# Patient Record
Sex: Female | Born: 1970 | Race: White | Hispanic: No | Marital: Married | Smoking: Current some day smoker
Health system: Southern US, Community
[De-identification: ages and names within clinical notes are randomized; demographics above are authoritative.]

## PROBLEM LIST (undated history)

## (undated) DIAGNOSIS — K219 Gastro-esophageal reflux disease without esophagitis: Secondary | ICD-10-CM

## (undated) DIAGNOSIS — M1990 Unspecified osteoarthritis, unspecified site: Secondary | ICD-10-CM

## (undated) DIAGNOSIS — C801 Malignant (primary) neoplasm, unspecified: Secondary | ICD-10-CM

## (undated) DIAGNOSIS — D219 Benign neoplasm of connective and other soft tissue, unspecified: Secondary | ICD-10-CM

## (undated) DIAGNOSIS — F32A Depression, unspecified: Secondary | ICD-10-CM

## (undated) DIAGNOSIS — D649 Anemia, unspecified: Secondary | ICD-10-CM

## (undated) DIAGNOSIS — J189 Pneumonia, unspecified organism: Secondary | ICD-10-CM

## (undated) DIAGNOSIS — I10 Essential (primary) hypertension: Secondary | ICD-10-CM

## (undated) DIAGNOSIS — F1010 Alcohol abuse, uncomplicated: Secondary | ICD-10-CM

## (undated) DIAGNOSIS — N920 Excessive and frequent menstruation with regular cycle: Secondary | ICD-10-CM

## (undated) DIAGNOSIS — R7303 Prediabetes: Secondary | ICD-10-CM

## (undated) DIAGNOSIS — I639 Cerebral infarction, unspecified: Secondary | ICD-10-CM

## (undated) DIAGNOSIS — F419 Anxiety disorder, unspecified: Secondary | ICD-10-CM

## (undated) HISTORY — PX: BACK SURGERY: SHX140

## (undated) HISTORY — PX: OTHER SURGICAL HISTORY: SHX169

## (undated) HISTORY — PX: FRACTURE SURGERY: SHX138

## (undated) HISTORY — PX: ELBOW SURGERY: SHX618

## (undated) HISTORY — DX: Gastro-esophageal reflux disease without esophagitis: K21.9

## (undated) HISTORY — PX: BREAST BIOPSY: SHX20

## (undated) HISTORY — PX: TUBAL LIGATION: SHX77

---

## 1998-03-05 ENCOUNTER — Other Ambulatory Visit: Admission: RE | Admit: 1998-03-05 | Discharge: 1998-03-05 | Payer: Self-pay | Admitting: Obstetrics and Gynecology

## 1999-03-09 ENCOUNTER — Encounter: Payer: Self-pay | Admitting: Surgery

## 1999-03-09 ENCOUNTER — Emergency Department (HOSPITAL_COMMUNITY): Admission: EM | Admit: 1999-03-09 | Discharge: 1999-03-09 | Payer: Self-pay | Admitting: Emergency Medicine

## 1999-03-31 ENCOUNTER — Other Ambulatory Visit: Admission: RE | Admit: 1999-03-31 | Discharge: 1999-03-31 | Payer: Self-pay | Admitting: Obstetrics and Gynecology

## 1999-11-17 ENCOUNTER — Encounter: Payer: Self-pay | Admitting: *Deleted

## 1999-11-17 ENCOUNTER — Emergency Department (HOSPITAL_COMMUNITY): Admission: EM | Admit: 1999-11-17 | Discharge: 1999-11-17 | Payer: Self-pay | Admitting: Emergency Medicine

## 2005-02-24 ENCOUNTER — Ambulatory Visit: Payer: Self-pay | Admitting: Internal Medicine
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
## 2005-08-06 ENCOUNTER — Ambulatory Visit: Payer: Self-pay

## 2006-07-27 ENCOUNTER — Emergency Department: Payer: Self-pay | Admitting: Emergency Medicine

## 2006-10-06 ENCOUNTER — Emergency Department: Payer: Self-pay
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
## 2006-11-06 ENCOUNTER — Emergency Department: Payer: Self-pay | Admitting: Emergency Medicine

## 2006-11-06 IMAGING — CR DG FOOT COMPLETE 3+V*L*
1 series · 3 of 3 positions shown · non-contrast
Comparison: none

REASON FOR EXAM: INJURY
COMMENTS:

[Series 1: view not recorded · 0.17mm/px · 3 of 3 slices shown]
[im 1/3]
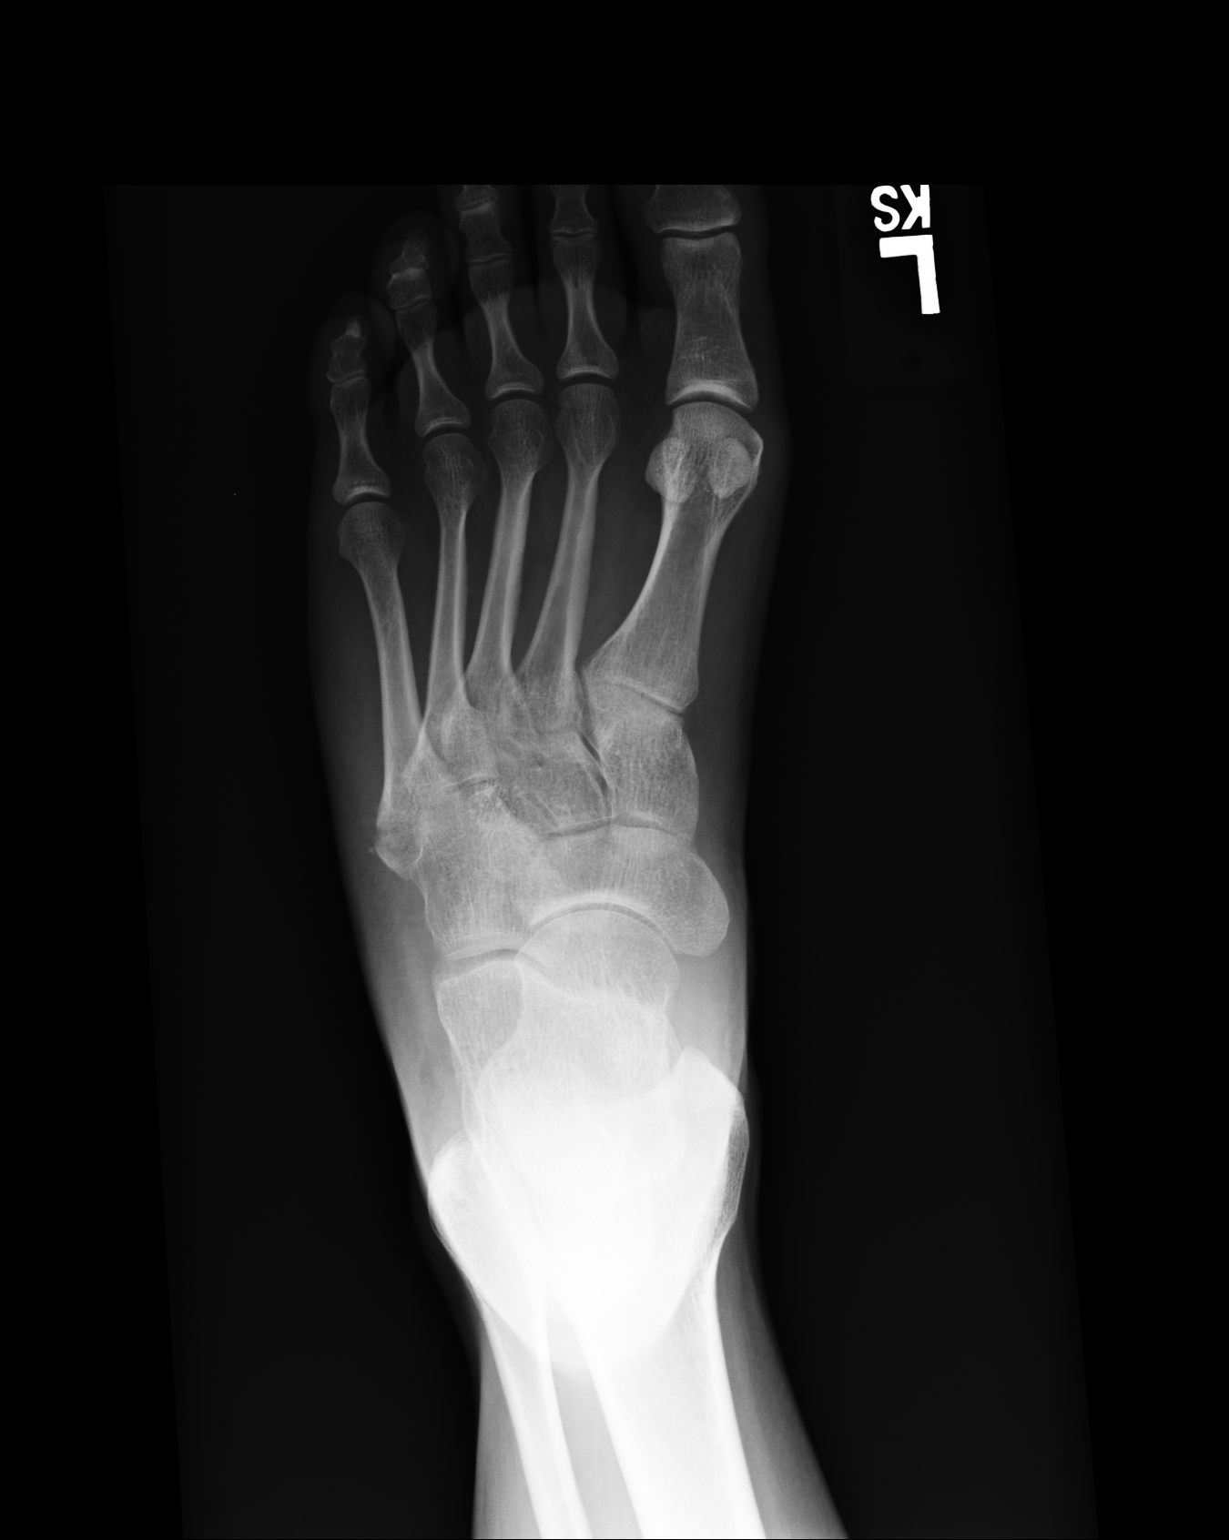
[im 2/3]
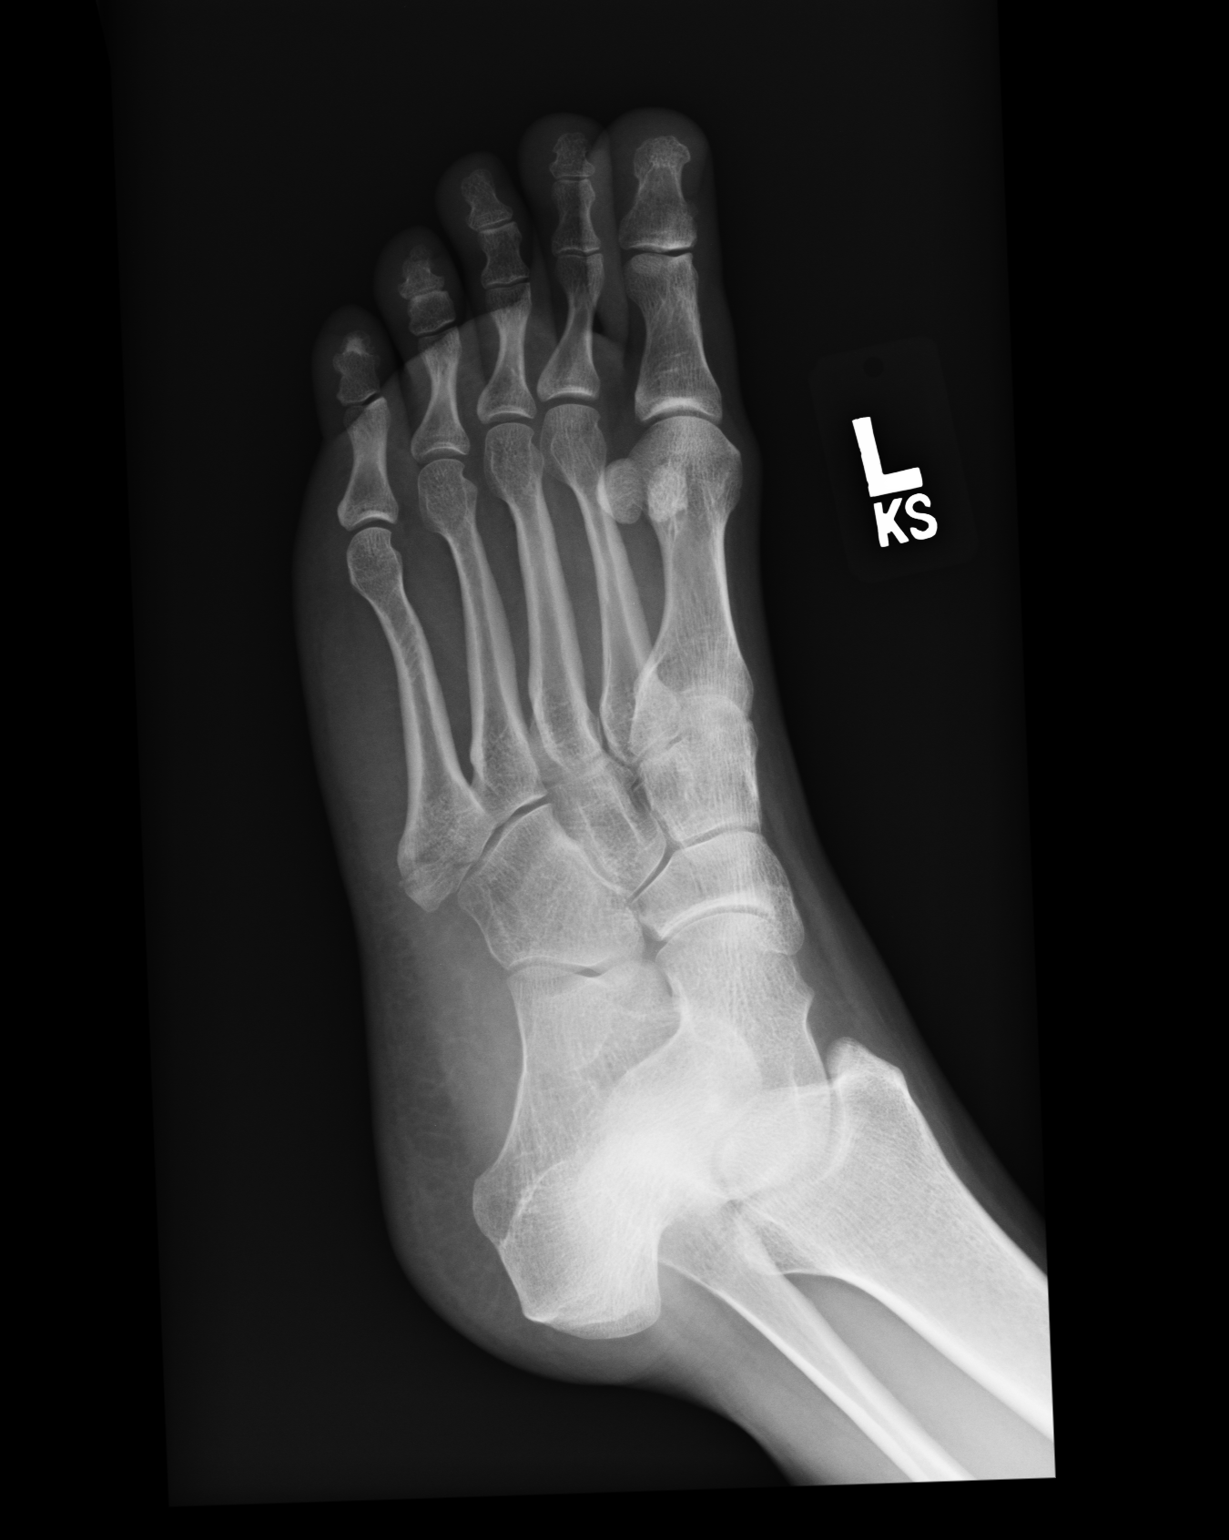
[im 3/3]
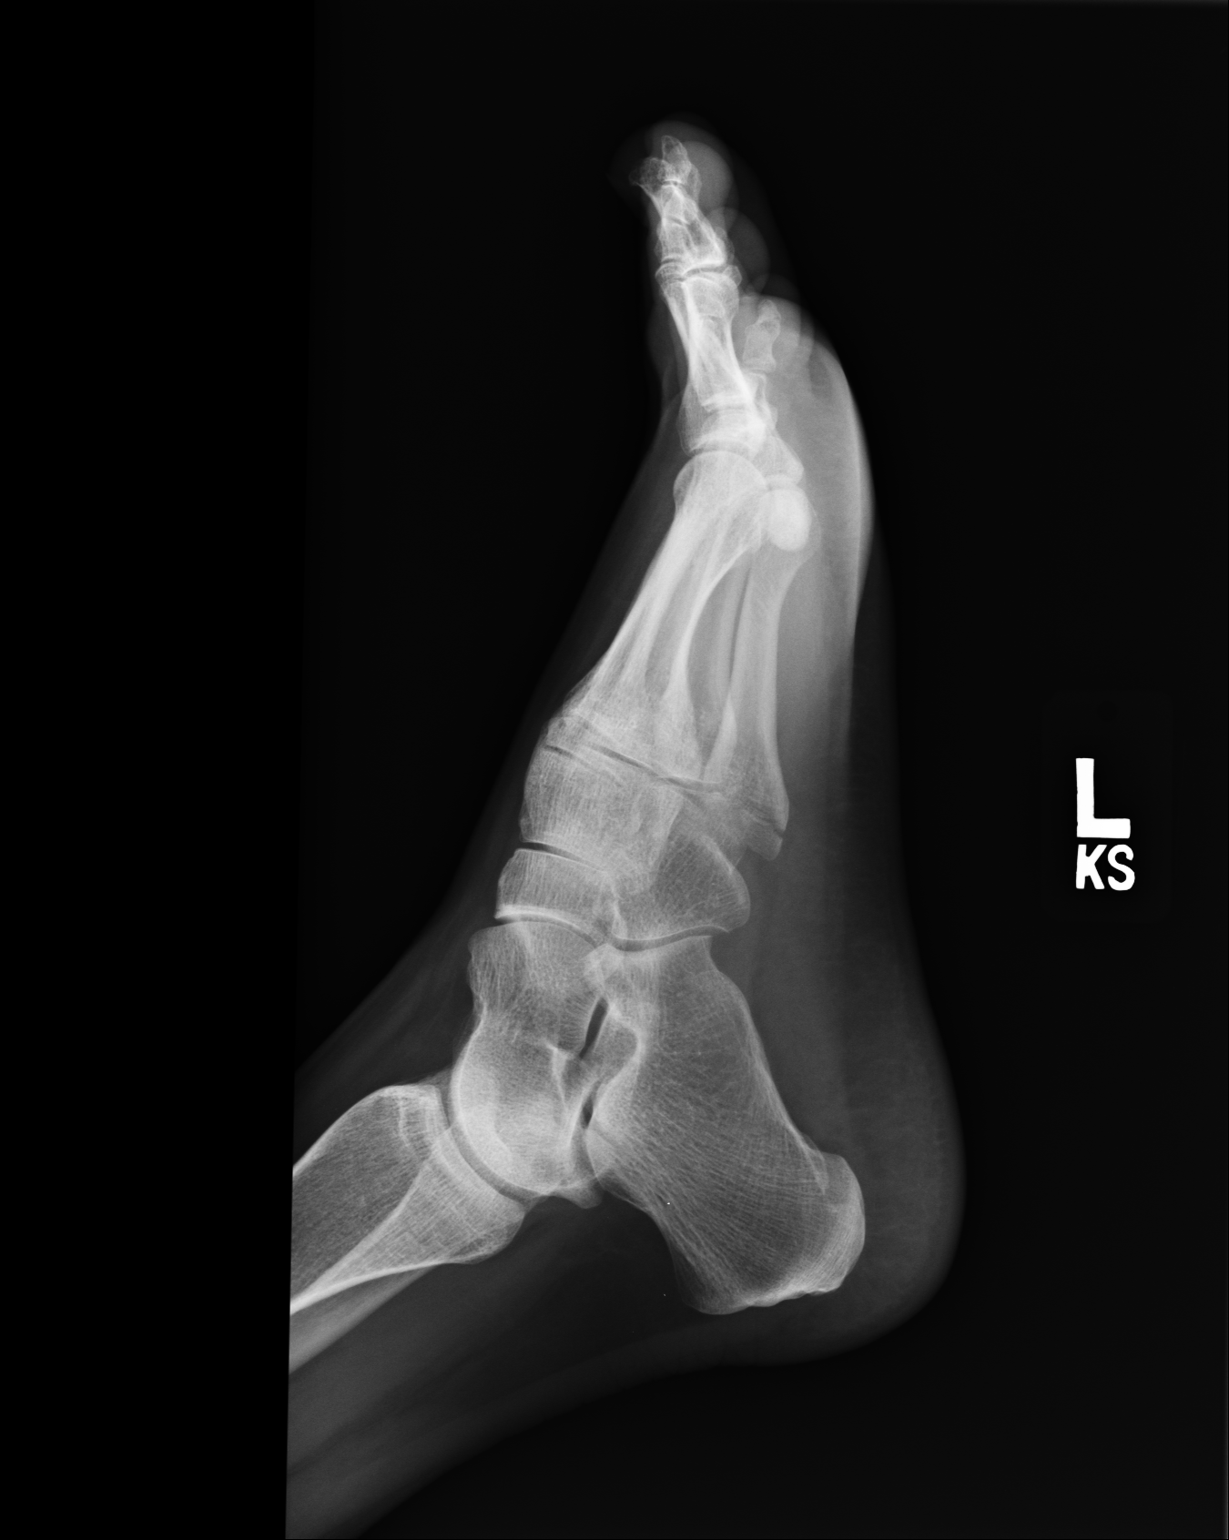

[3 of 3 positions shown; findings below may reference images not displayed]

PROCEDURE:     DXR - DXR FOOT LT COMP W/OBLIQUES  - [DATE]  [DATE]

RESULT:     There is a fracture through the base the fifth metatarsal. There
is mild distraction of the fracture fragments. The other metatarsals appear
intact. I do not see evidence of a phalangeal fracture. The hindfoot is
intact as well.
IMPRESSION: The patient has sustained a fracture of the fifth
metatarsal. I do not see acute fracture elsewhere.

## 2010-07-09 ENCOUNTER — Other Ambulatory Visit: Payer: Self-pay | Admitting: Ophthalmology

## 2011-08-29 ENCOUNTER — Ambulatory Visit: Payer: Self-pay

## 2011-08-29 DIAGNOSIS — J029 Acute pharyngitis, unspecified: Secondary | ICD-10-CM

## 2013-06-02 ENCOUNTER — Encounter (HOSPITAL_COMMUNITY): Payer: Self-pay

## 2013-06-02 ENCOUNTER — Emergency Department (HOSPITAL_COMMUNITY)
Admission: EM | Admit: 2013-06-02 | Discharge: 2013-06-02 | Disposition: A | Discharge status: Home / Self Care | Payer: Self-pay | Attending: Emergency Medicine | Admitting: Emergency Medicine

## 2013-06-02 ENCOUNTER — Emergency Department (HOSPITAL_COMMUNITY): Payer: Self-pay

## 2013-06-02 DIAGNOSIS — R296 Repeated falls: Secondary | ICD-10-CM | POA: Insufficient documentation

## 2013-06-02 DIAGNOSIS — M7918 Myalgia, other site: Secondary | ICD-10-CM

## 2013-06-02 DIAGNOSIS — IMO0002 Reserved for concepts with insufficient information to code with codable children: Secondary | ICD-10-CM | POA: Insufficient documentation

## 2013-06-02 DIAGNOSIS — F17200 Nicotine dependence, unspecified, uncomplicated: Secondary | ICD-10-CM | POA: Insufficient documentation

## 2013-06-02 DIAGNOSIS — Y92009 Unspecified place in unspecified non-institutional (private) residence as the place of occurrence of the external cause: Secondary | ICD-10-CM | POA: Insufficient documentation

## 2013-06-02 DIAGNOSIS — Z79899 Other long term (current) drug therapy: Secondary | ICD-10-CM | POA: Insufficient documentation

## 2013-06-02 DIAGNOSIS — I10 Essential (primary) hypertension: Secondary | ICD-10-CM | POA: Insufficient documentation

## 2013-06-02 DIAGNOSIS — Z880 Allergy status to penicillin: Secondary | ICD-10-CM | POA: Insufficient documentation

## 2013-06-02 DIAGNOSIS — IMO0001 Reserved for inherently not codable concepts without codable children: Secondary | ICD-10-CM | POA: Insufficient documentation

## 2013-06-02 DIAGNOSIS — Y939 Activity, unspecified: Secondary | ICD-10-CM | POA: Insufficient documentation

## 2013-06-02 HISTORY — DX: Essential (primary) hypertension: I10

## 2013-06-02 LAB — BASIC METABOLIC PANEL
BUN: 14 mg/dL (ref 6–23)
CO2: 23 mEq/L (ref 19–32)
Chloride: 85 mEq/L — ABNORMAL LOW (ref 96–112)
Creatinine, Ser: 0.6 mg/dL (ref 0.50–1.10)
GFR calc Af Amer: >90 mL/min (ref >90)
GFR calc non Af Amer: >90 mL/min (ref >90)
Potassium: 3.6 mEq/L (ref 3.5–5.1)
Sodium: 129 mEq/L — ABNORMAL LOW (ref 135–145)

## 2013-06-02 LAB — URINALYSIS, ROUTINE W REFLEX MICROSCOPIC
Glucose, UA: NEGATIVE mg/dL
Ketones, ur: NEGATIVE mg/dL
Leukocytes, UA: NEGATIVE
Nitrite: NEGATIVE
Protein, ur: NEGATIVE mg/dL

## 2013-06-02 LAB — CBC
Hemoglobin: 8.8 g/dL — ABNORMAL LOW (ref 12.0–15.0)
MCH: 22.3 pg — ABNORMAL LOW (ref 26.0–34.0)
MCHC: 31.2 g/dL (ref 30.0–36.0)
Platelets: 526 10*3/uL — ABNORMAL HIGH (ref 150–400)
RBC: 3.94 MIL/uL (ref 3.87–5.11)
RDW: 16.6 % — ABNORMAL HIGH (ref 11.5–15.5)

## 2013-06-02 LAB — POCT I-STAT TROPONIN I: Troponin i, poc: 0 ng/mL (ref 0.00–0.08)

## 2013-06-02 LAB — MAGNESIUM: Magnesium: 1.8 mg/dL (ref 1.5–2.5)

## 2013-06-02 IMAGING — CR DG CHEST 2V
2 series · 2 of 2 positions shown · non-contrast
Comparison: None.

CLINICAL DATA: Chest pain

EXAM:
CHEST  2 VIEW

[w chest pa]
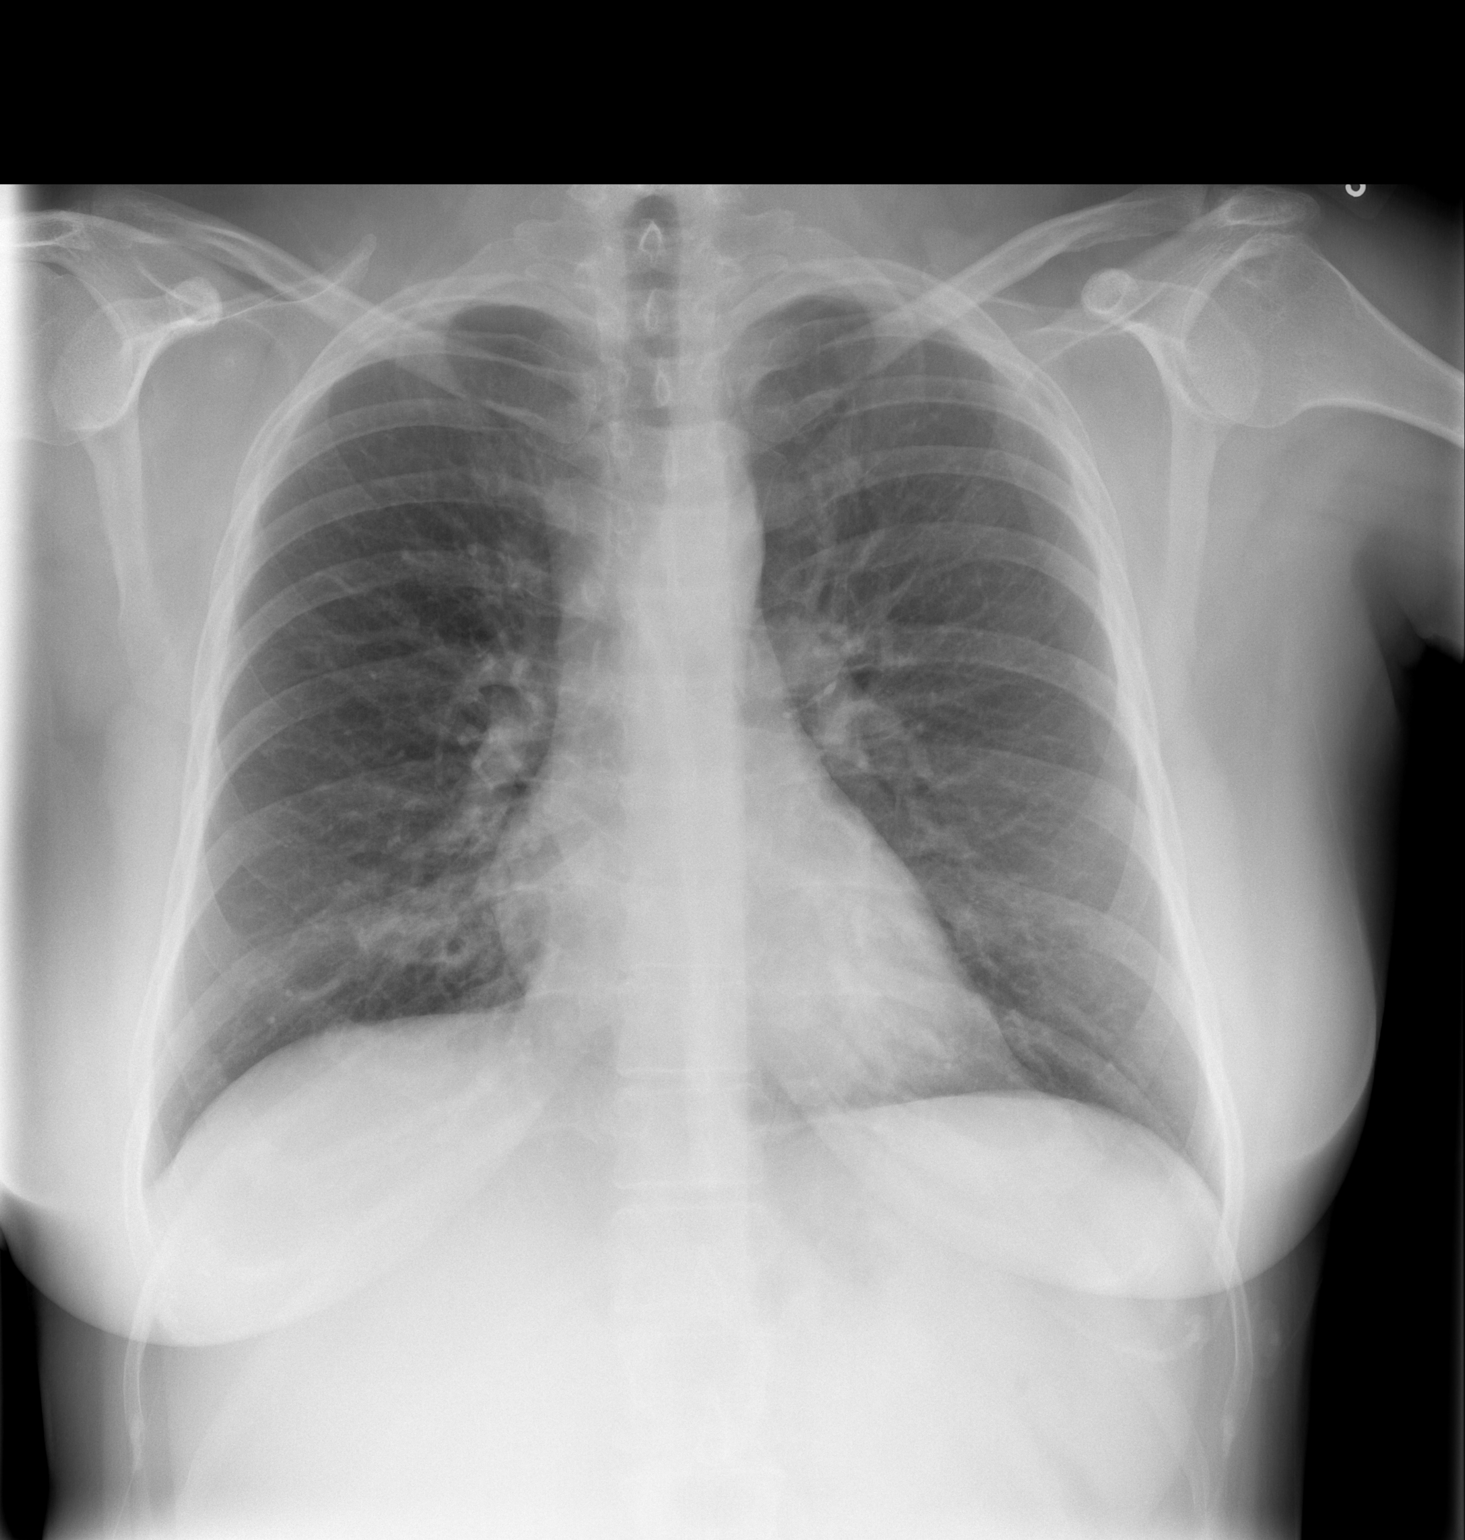

[w chest lat]
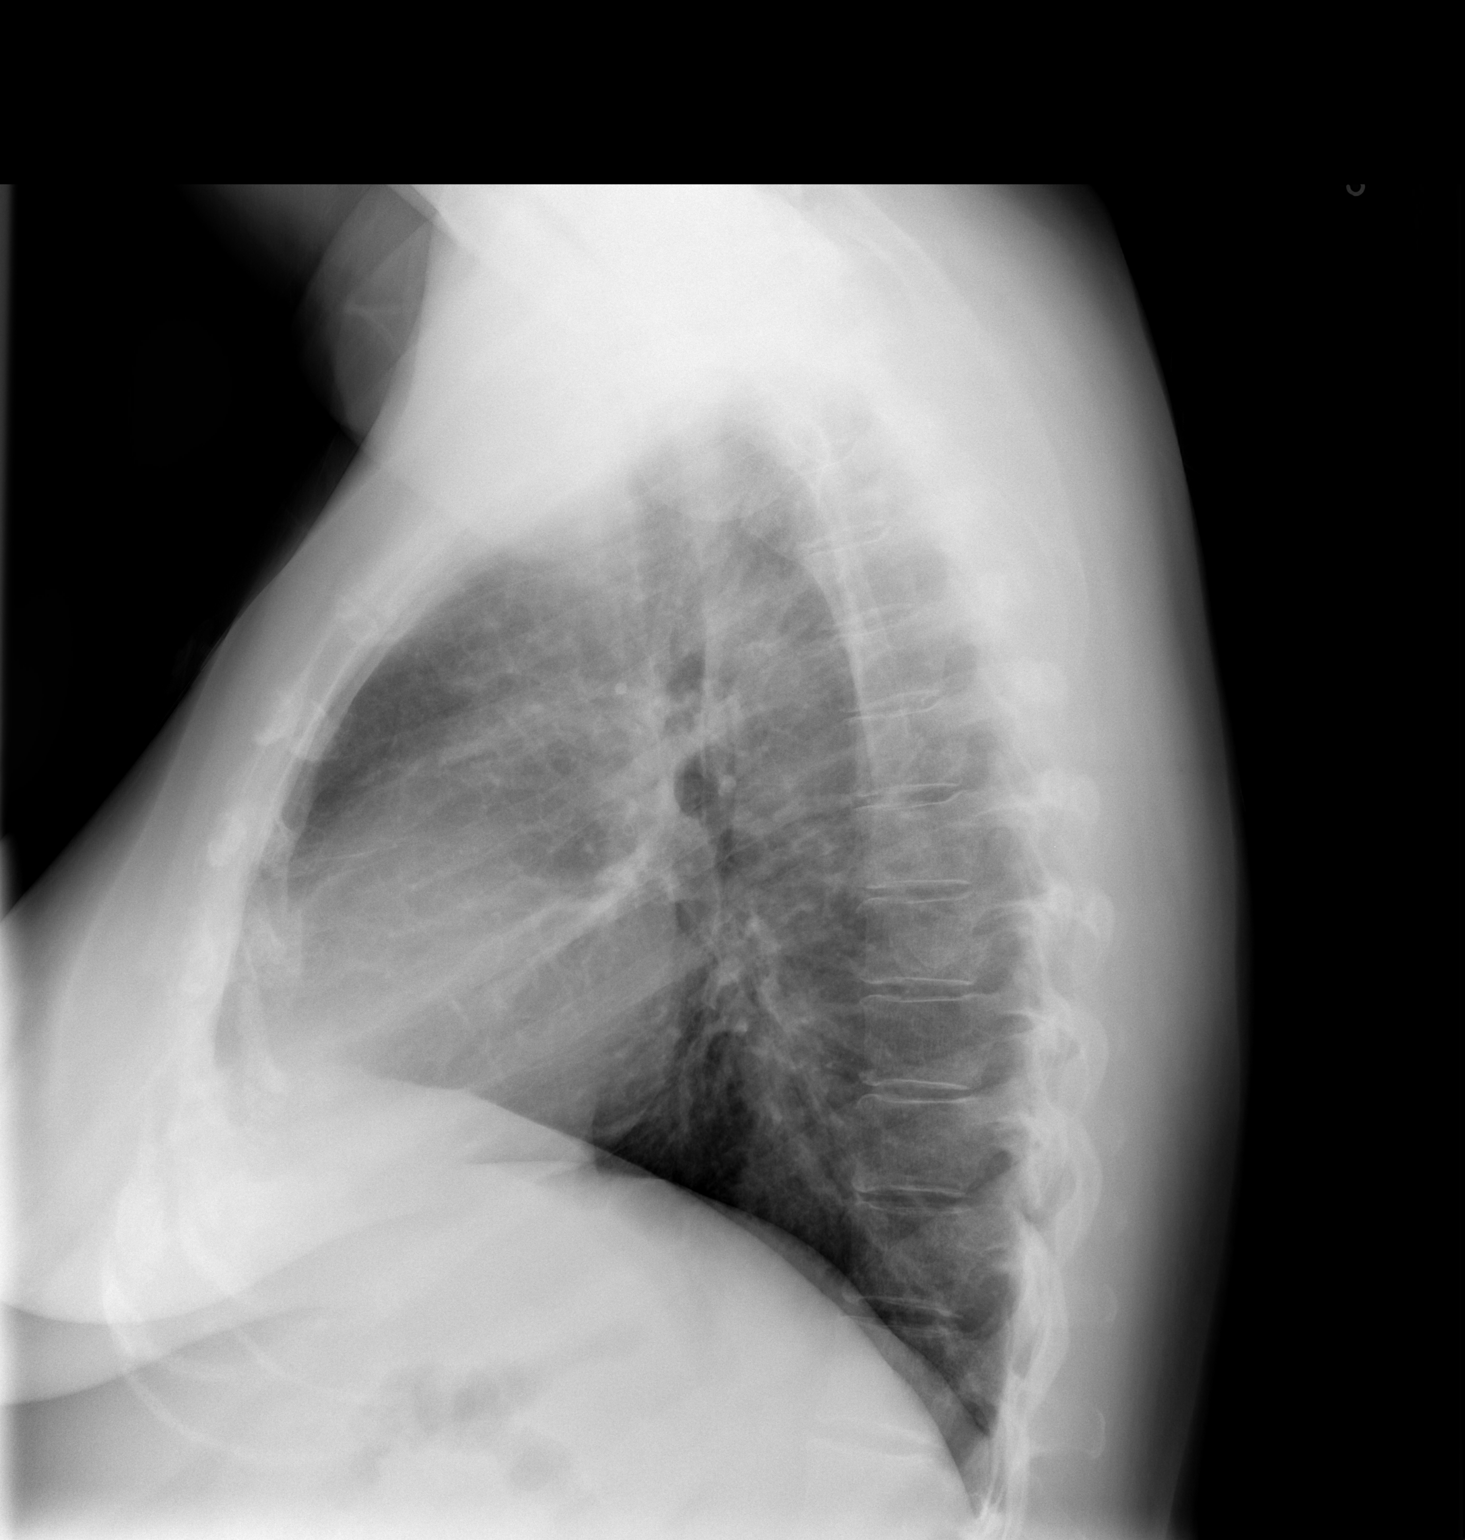

[2 of 2 positions shown; findings below may reference images not displayed]

FINDINGS: Cardiac and mediastinal contours are normal. Lungs are clear without
infiltrate or effusion or mass lesion.
IMPRESSION: No active cardiopulmonary disease.

## 2013-06-02 IMAGING — CT CT ABD-PELV W/ CM
2 of 5 series · 17 of 46 positions shown, 19 images · IV contrast (CONTRAST)
Comparison: None.

CLINICAL DATA: Patient reportedly data upper and became six to or
stomach, and apparently had a syncopal episode and a going to the
bathroom. Patient reports having right flank pain. Some nausea.

EXAM:
CT ABDOMEN AND PELVIS WITH CONTRAST
TECHNIQUE: Multidetector CT imaging of the abdomen and pelvis was performed
using the standard protocol following bolus administration of
intravenous contrast.
CONTRAST:  100mL OMNIPAQUE IOHEXOL 300 MG/ML  SOLN

[Series 2: routine · axial · 0.71mm/px · z∈[+466,+916]mm · 14 of 102 slices shown, 16 images]
[im 6/102  soft-tissue]
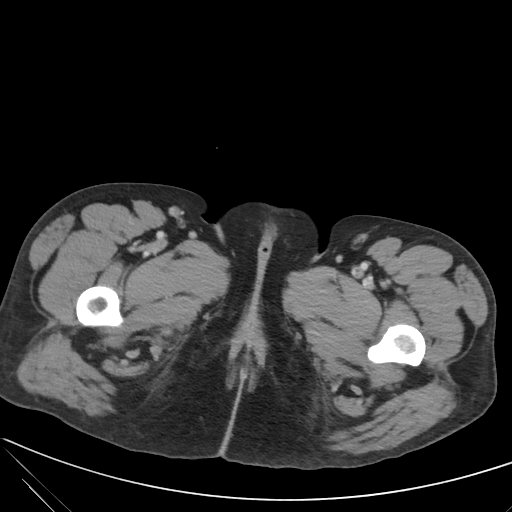
[im 6/102  bone]
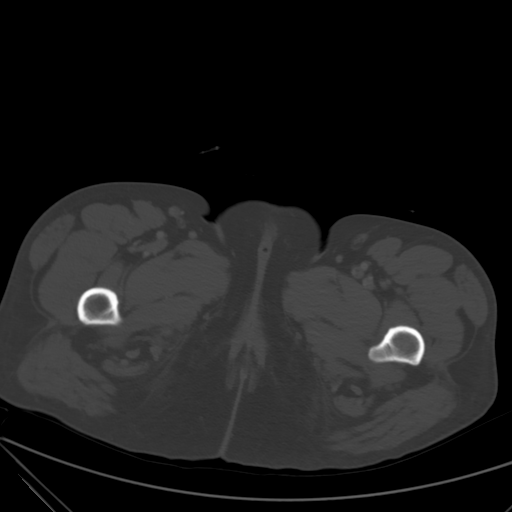
[im 11/102  soft-tissue]
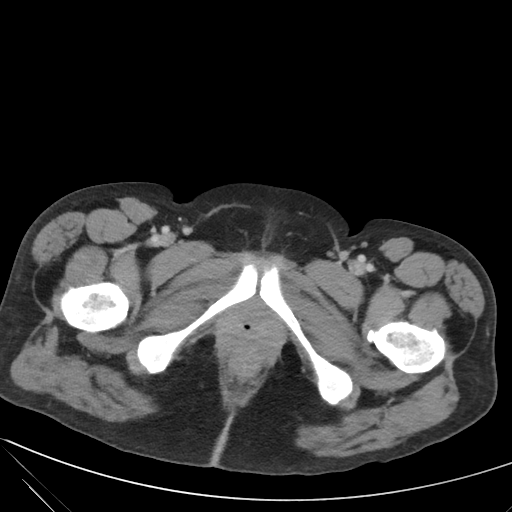
[im 22/102  soft-tissue]
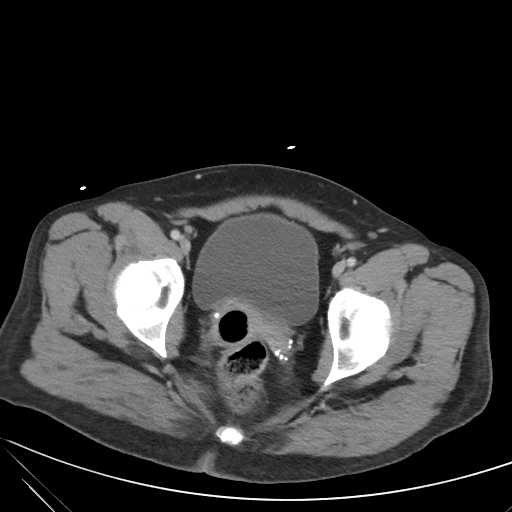
[im 27/102  soft-tissue]
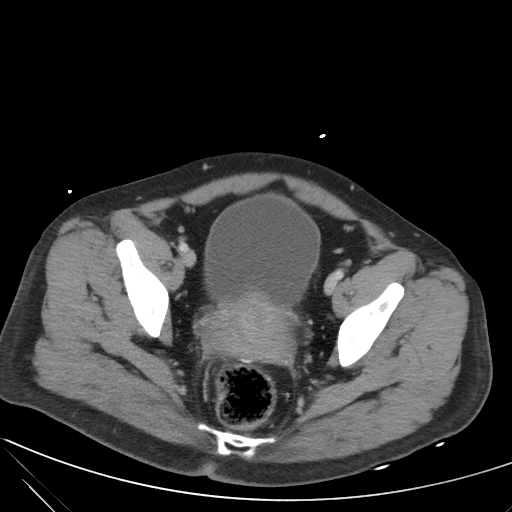
[im 32/102  soft-tissue]
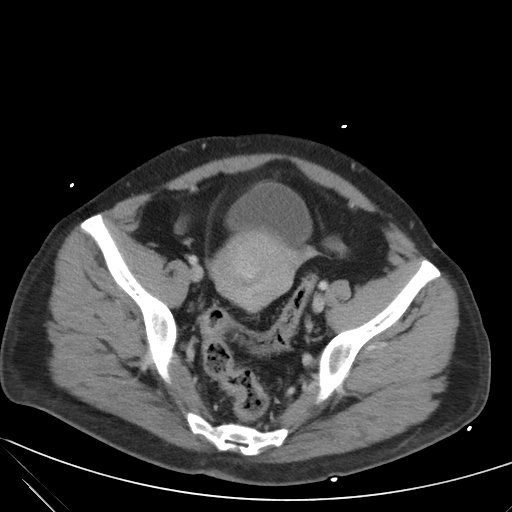
[im 43/102  soft-tissue]
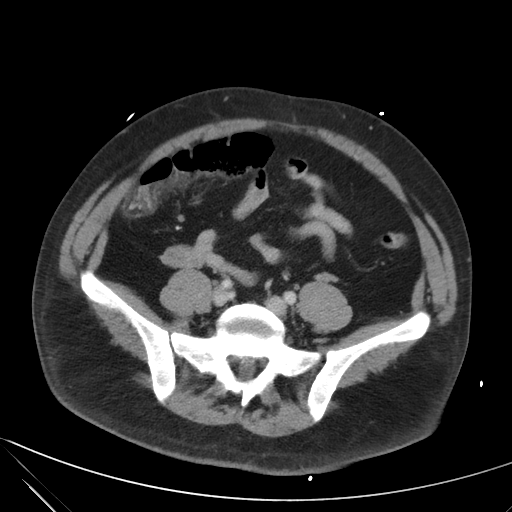
[im 48/102  soft-tissue]
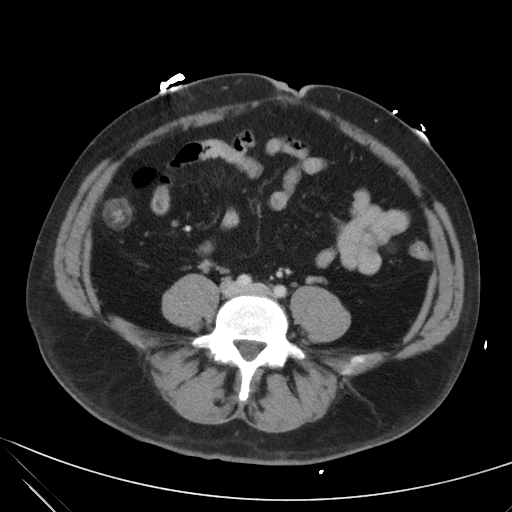
[im 54/102  soft-tissue]
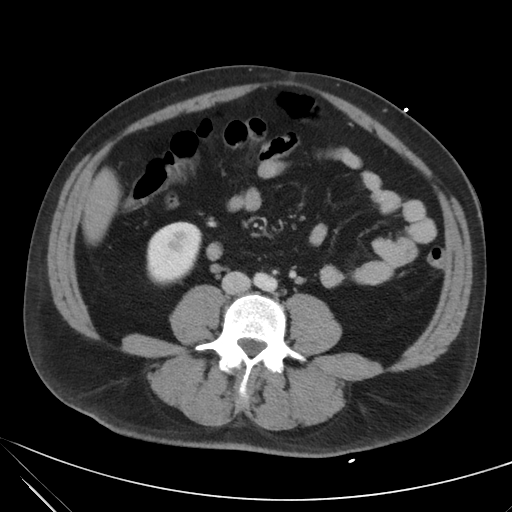
[im 59/102  soft-tissue]
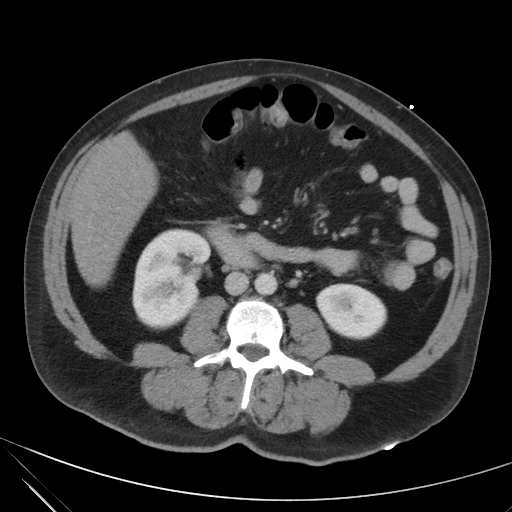
[im 59/102  bone]
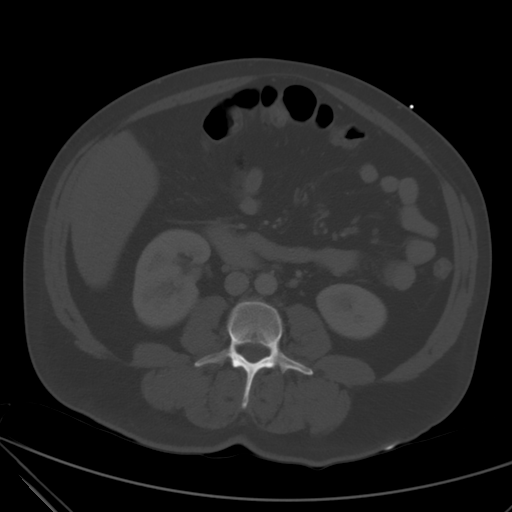
[im 70/102  soft-tissue]
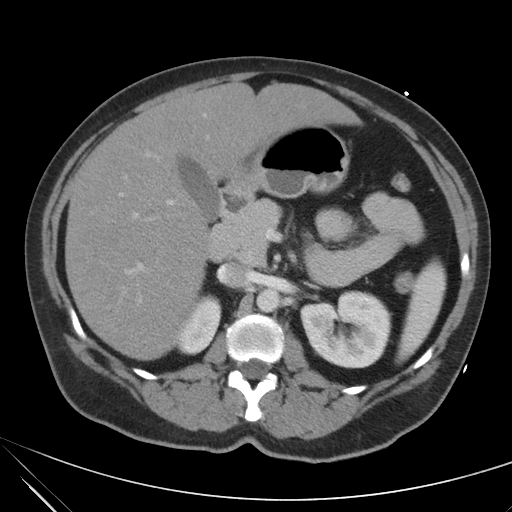
[im 75/102  soft-tissue]
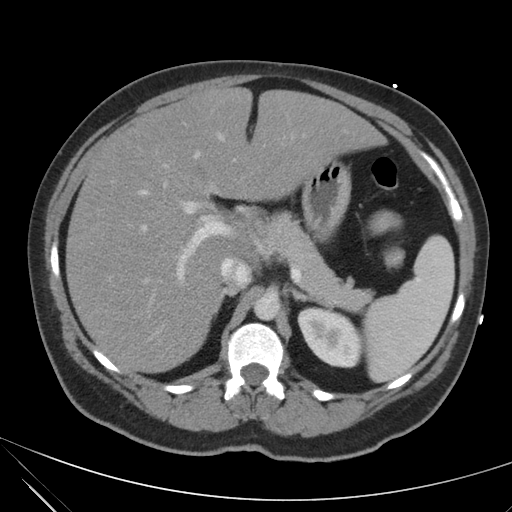
[im 80/102  soft-tissue]
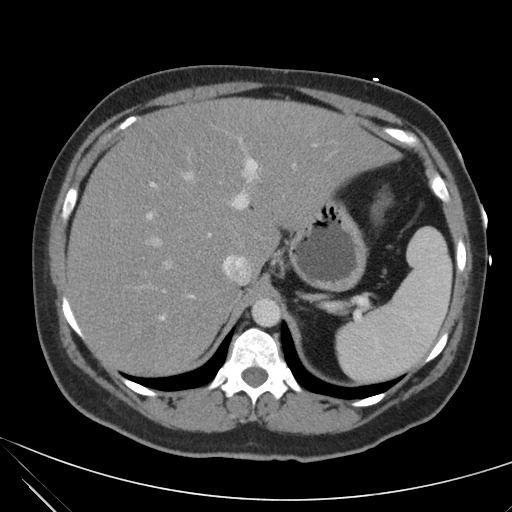
[im 91/102  soft-tissue]
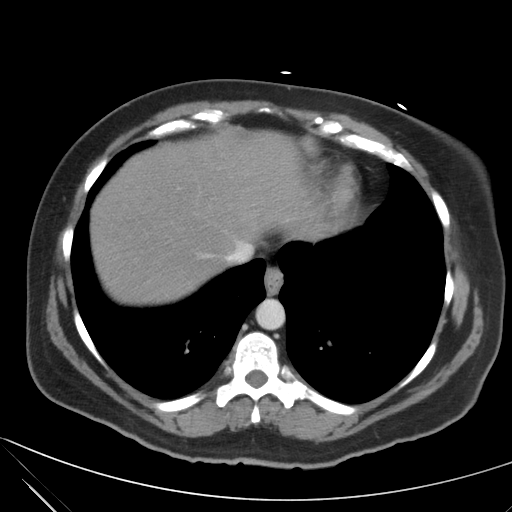
[im 96/102  soft-tissue]
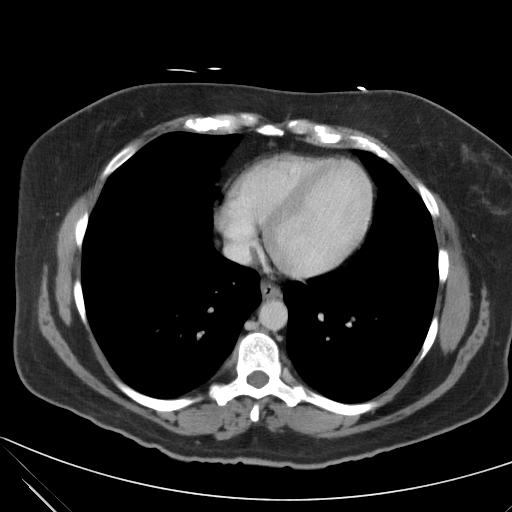

[coronals · coronal · 0.98mm/px · 3 of 109 slices shown]
[im 37/109  soft-tissue]
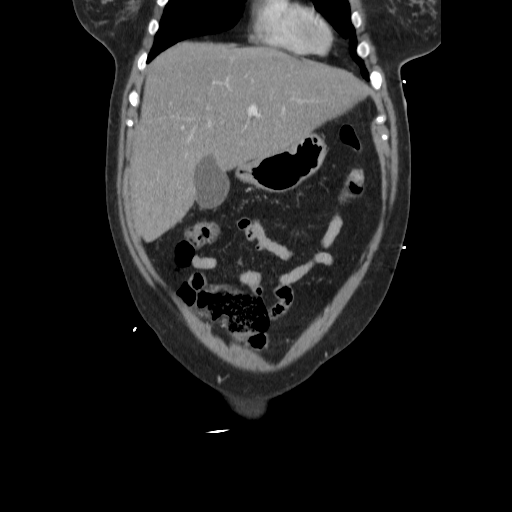
[im 49/109  soft-tissue]
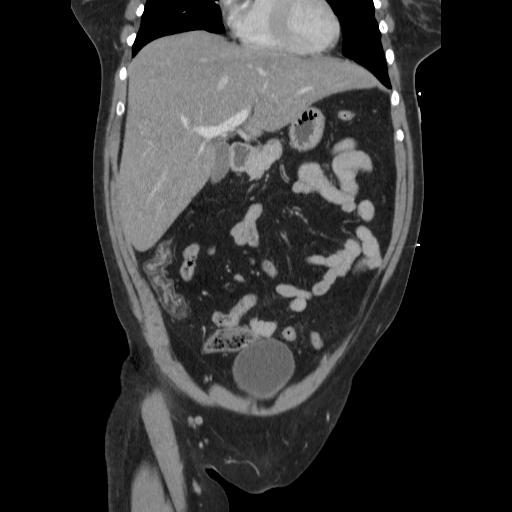
[im 61/109  soft-tissue]
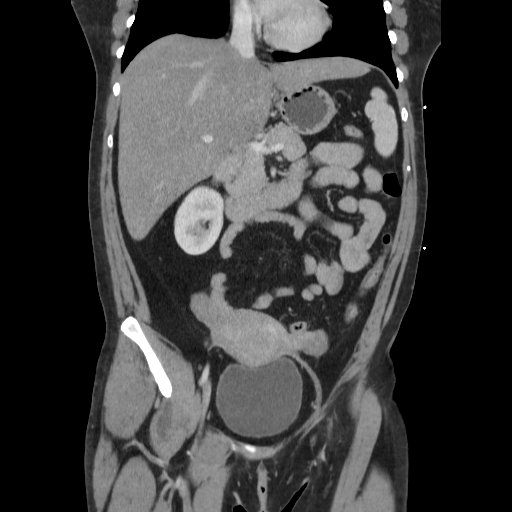

[17 of 46 positions shown; findings below may reference images not displayed]

FINDINGS: Clear lung bases. The heart is normal in size.

The liver is mildly enlarged and diffusely hypoattenuating
consistent with hepatic steatosis. No liver mass or focal lesion is
seen. Normal spleen, gallbladder and pancreas. No bile duct
dilation. No adrenal masses. Normal kidneys, ureters and bladder.

The uterus and adnexa are unremarkable.

No adenopathy. There are no abnormal fluid collections.

The bowel is unremarkable. A short normal caliber appendix is seen.

No bony abnormality.
IMPRESSION: 1. No acute findings.
2. Mild hepatomegaly and hepatic steatosis.
3. No other abnormalities.

## 2013-06-02 MED ORDER — HYDROCODONE-ACETAMINOPHEN 5-325 MG PO TABS: 2.0000 | ORAL_TABLET | ORAL | Status: DC | PRN

## 2013-06-02 MED ORDER — IOHEXOL 300 MG/ML  SOLN
100.0000 mL | Freq: Once | INTRAMUSCULAR | Status: AC | PRN
  Administered 2013-06-02: 100 mL via INTRAVENOUS

## 2013-06-02 MED ORDER — CYCLOBENZAPRINE HCL 10 MG PO TABS: 10.0000 mg | ORAL_TABLET | Freq: Two times a day (BID) | ORAL | Status: DC | PRN

## 2013-06-02 MED ORDER — HYDROMORPHONE HCL PF 1 MG/ML IJ SOLN
1.0000 mg | Freq: Once | INTRAMUSCULAR | Status: AC
  Administered 2013-06-02: 1 mg via INTRAVENOUS
  Filled 2013-06-02: qty 1

## 2013-06-02 NOTE — ED Notes (Signed)
Patient transported to CT 

## 2013-06-02 NOTE — ED Provider Notes (Addendum)
CSN: [id number]     Arrival date & time 06/02/13  1219 History   First MD Initiated Contact with Patient 06/02/13 1556     Chief Complaint  Patient presents with  . Chest Pain   (Consider location/radiation/quality/duration/timing/severity/associated sxs/prior Treatment) Patient is a 42 y.o. female presenting with back pain. The history is provided by the patient. No language interpreter was used.  Back Pain Pain location: right paraspinal thoracic. Quality:  Aching and cramping Radiates to:  Does not radiate Pain severity:  Severe Pain is:  Same all the time Onset quality:  Sudden Duration:  12 hours Timing:  Intermittent Progression:  Waxing and waning Chronicity:  New Context: falling   Relieved by:  Bed rest Worsened by:  Movement Ineffective treatments:  None tried Associated symptoms: no abdominal pain, no bladder incontinence, no bowel incontinence, no chest pain, no dysuria, no fever, no numbness and no weakness     Past Medical History  Diagnosis Date  . Hypertension    Past Surgical History  Procedure Laterality Date  . Elbow surgery    . Tubal ligation     History reviewed. No pertinent family history. History  Substance Use Topics  . Smoking status: Current Some Day Smoker  . Smokeless tobacco: Not on file  . Alcohol Use: No   OB History   Grav Para Term Preterm Abortions TAB SAB Ect Mult Living                 Review of Systems  Constitutional: Negative for fever and appetite change.  Respiratory: Negative for shortness of breath.   Cardiovascular: Negative for chest pain and palpitations.  Gastrointestinal: Negative for nausea, vomiting, abdominal pain, diarrhea, abdominal distention and bowel incontinence.  Genitourinary: Negative for bladder incontinence, dysuria and hematuria.  Musculoskeletal: Positive for back pain. Negative for gait problem.  Neurological: Negative for weakness and numbness.  All other systems reviewed and are  negative.    Allergies  Amoxicillin  Home Medications   Current Outpatient Rx  Name  Route  Sig  Dispense  Refill  . ibuprofen (ADVIL,MOTRIN) 400 MG tablet   Oral   Take 400 mg by mouth every 6 (six) hours as needed for pain (for pain).         Marland Kitchen lisinopril-hydrochlorothiazide (PRINZIDE,ZESTORETIC) 10-12.5 MG per tablet   Oral   Take 1 tablet by mouth daily.         . predniSONE (STERAPRED UNI-PAK) 10 MG tablet   Oral   Take 10 mg by mouth daily. Pt had skin irritation, finished 06/01/13         . triamcinolone (KENALOG) 0.025 % cream   Topical   Apply 1 application topically 2 (two) times daily.          BP 124/77  Pulse 93  Temp(Src) 98.3 F (36.8 C) (Oral)  Resp 20  Ht 5\' 3"  (1.6 m)  Wt 168 lb (76.204 kg)  BMI 29.77 kg/m2  SpO2 99%  LMP 06/02/2013 Physical Exam  Vitals reviewed. Constitutional: She is oriented to person, place, and time. She appears well-developed and well-nourished.  HENT:  Head: Atraumatic.  Eyes: Pupils are equal, round, and reactive to light.  Neck: Normal range of motion.  Cardiovascular: Normal rate, regular rhythm, normal heart sounds and intact distal pulses.   Pulmonary/Chest: Effort normal and breath sounds normal. She exhibits no tenderness.  Abdominal: Soft. Bowel sounds are normal. She exhibits no distension. There is no tenderness.  Musculoskeletal: Normal range of  motion.       Cervical back: Normal.       Thoracic back: She exhibits tenderness. She exhibits no bony tenderness.       Lumbar back: Normal.  Neurological: She is alert and oriented to person, place, and time.  Skin: Skin is warm and dry.    ED Course  Procedures (including critical care time)  Ultrasound limited abdominal and limited transthoracic ultrasound (FAST)  Indication: Fall, Right flank pain Four views were obtained using the low frequency transducer: Splenorenal, Hepatorenal, Retrovesical, Pericardial subxyphoid Interpretation: No free  fluid visualized surrounding the kidneys, pelvis or pericardium. Images archived electronically Dr. Jodi Mourning personally performed and interpreted the images  Labs Review Labs Reviewed  CBC - Abnormal; Notable for the following:    WBC 16.1 (*)    Hemoglobin 8.8 (*)    HCT 28.2 (*)    MCV 71.6 (*)    MCH 22.3 (*)    RDW 16.6 (*)    Platelets 526 (*)    All other components within normal limits  BASIC METABOLIC PANEL - Abnormal; Notable for the following:    Sodium 129 (*)    Chloride 85 (*)    Glucose, Bld 110 (*)    All other components within normal limits  POCT I-STAT TROPONIN I   Imaging Review Dg Chest 2 View  06/02/2013   CLINICAL DATA:  Chest pain  EXAM: CHEST  2 VIEW  COMPARISON:  None.  FINDINGS: Cardiac and mediastinal contours are normal. Lungs are clear without infiltrate or effusion or mass lesion.  IMPRESSION: No active cardiopulmonary disease.   Electronically Signed   By: Marlan Palau M.D.   On: 06/02/2013 13:06   Ct Abdomen Pelvis W Contrast  06/02/2013   CLINICAL DATA:  Patient reportedly data upper and became six to or stomach, and apparently had a syncopal episode and a going to the bathroom. Patient reports having right flank pain. Some nausea.  EXAM: CT ABDOMEN AND PELVIS WITH CONTRAST  TECHNIQUE: Multidetector CT imaging of the abdomen and pelvis was performed using the standard protocol following bolus administration of intravenous contrast.  CONTRAST:  OMNIPAQUE IOHEXOL 300 MG/ML  SOLN  COMPARISON:  None.  FINDINGS: Clear lung bases. The heart is normal in size.  The liver is mildly enlarged and diffusely hypoattenuating consistent with hepatic steatosis. No liver mass or focal lesion is seen. Normal spleen, gallbladder and pancreas. No bile duct dilation. No adrenal masses. Normal kidneys, ureters and bladder.  The uterus and adnexa are unremarkable.  No adenopathy. There are no abnormal fluid collections.  The bowel is unremarkable. A short normal caliber  appendix is seen.  No bony abnormality.  IMPRESSION: 1. No acute findings. 2. Mild hepatomegaly and hepatic steatosis. 3. No other abnormalities.   Electronically Signed   By: Amie Portland   On: 06/02/2013 19:00   Date: 06/03/2013  Rate: 95  Rhythm: normal sinus rhythm  QRS Axis: normal  Intervals: QT prolonged  ST/T Wave abnormalities: normal  Conduction Disutrbances:none  Narrative Interpretation: sinus rhythm, QT prolonged  Old EKG Reviewed: none available    MDM   1. Musculoskeletal pain    42 y/o female presenting with right paraspinal tenderness after fall at home. Reports drinking heavily the night before and awoke feeling nauseous. Went to the bathroom where she became diaphoretic, tunnel vision, and LOC. Following this she began having cramping right paraspinal back pain. No midline pain, bowel/bladder incontinence, perianal numbness, difficulty ambulating, abdominal pain.  No evidence of head trauma. Denies SOB. CXR without pneumothorax or visible rib fracture. Labs remarkable for Hgb 8.8, WBC 16.1. CT abdomen/pelvis ordered to r/o liver injury/intraabdominal hemorrhage and showed no injury. Troponin drawn in triage, negative. Symptoms inconsistent with ACS. EKG with QT prolongation, no ischemic changes, no priors for comparison. No electrolyte abnormality or medication to explain QT prolongation. Given imaging findings, low risk of occult pneumothorax or intraabdominal injury. Will treat pain, likely musculoskeletal contusion/spasm. Recommend PCP follow up. Return precautions discussed and patient voiced understanding of instruction.   Labs and imaging reviewed in my medical decision making. Patient discussed with my attending, Dr. Jodi Mourning.     Abagail Kitchens, MD 06/03/13 0155  Medical screening examination/treatment/procedure(s) were conducted as a shared visit with non-physician practitioner(s) or resident  and myself.  I personally evaluated the patient during the encounter and  agree with the findings and plan unless otherwise indicated.    EKG reviewed with resident.  Right flank pain with etoh hx, pt does not recall details.  CT to rule out liver injury with RUQ/ right flank pain.  Tender right lower ribs.  Bedside US no free fluid on FAST. CT no acute findings.  DC discussed.   Ultrasound limited abdominal and limited transthoracic ultrasound (FAST)  Indication: right flank pain, fall Four views were obtained using the low frequency transducer: Splenorenal, Hepatorenal, Retrovesical, Pericardial subxyphoid Interpretation: No free fluid visualized surrounding the kidneys, pelvis or pericardium. Images archived electronically Dr. Jodi Mourning personally performed and interpreted the images  Enid Skeens, MD 06/04/13 2952  Enid Skeens, MD 06/07/13 [phone]

## 2013-06-02 NOTE — ED Notes (Addendum)
Stated got up and become sick to her stomach, went to bathroom and that was the last thing she remembered, when she open her eyes, she was on the floor and could not recall what happened. After that started having right flank pain. Stated some dizznes and nausea but never vomited. Denies having any hx of these event before.  She denies any CP now

## 2013-06-02 NOTE — ED Notes (Signed)
Pt reports waking up this am feeling nauseous, pt reports she went to the bathroom and fell with a brief period of LOC, states since the fall she has been having intermittent mid back pain radiating through to her chest, diaphoretic, and tunnel vision. Pt reports she just recently started HTN medication and drank a large amount of ETOH last night

## 2013-07-12 ENCOUNTER — Encounter (INDEPENDENT_AMBULATORY_CARE_PROVIDER_SITE_OTHER): Payer: Self-pay

## 2013-07-12 ENCOUNTER — Ambulatory Visit (INDEPENDENT_AMBULATORY_CARE_PROVIDER_SITE_OTHER): Payer: Self-pay | Admitting: Adult Health

## 2013-07-12 ENCOUNTER — Encounter: Payer: Self-pay | Admitting: Adult Health

## 2013-07-12 VITALS — BP 132/90 | HR 88 | Temp 98.2°F | Wt 160.8 lb

## 2013-07-12 DIAGNOSIS — Z716 Tobacco abuse counseling: Secondary | ICD-10-CM

## 2013-07-12 DIAGNOSIS — F1090 Alcohol use, unspecified, uncomplicated: Secondary | ICD-10-CM | POA: Insufficient documentation

## 2013-07-12 DIAGNOSIS — R5381 Other malaise: Secondary | ICD-10-CM

## 2013-07-12 DIAGNOSIS — F17200 Nicotine dependence, unspecified, uncomplicated: Secondary | ICD-10-CM

## 2013-07-12 DIAGNOSIS — F1010 Alcohol abuse, uncomplicated: Secondary | ICD-10-CM | POA: Insufficient documentation

## 2013-07-12 DIAGNOSIS — R5383 Other fatigue: Secondary | ICD-10-CM | POA: Insufficient documentation

## 2013-07-12 DIAGNOSIS — D649 Anemia, unspecified: Secondary | ICD-10-CM | POA: Insufficient documentation

## 2013-07-12 DIAGNOSIS — B372 Candidiasis of skin and nail: Secondary | ICD-10-CM | POA: Insufficient documentation

## 2013-07-12 DIAGNOSIS — R6889 Other general symptoms and signs: Secondary | ICD-10-CM

## 2013-07-12 DIAGNOSIS — Z720 Tobacco use: Secondary | ICD-10-CM | POA: Insufficient documentation

## 2013-07-12 DIAGNOSIS — Z7189 Other specified counseling: Secondary | ICD-10-CM

## 2013-07-12 DIAGNOSIS — R899 Unspecified abnormal finding in specimens from other organs, systems and tissues: Secondary | ICD-10-CM | POA: Insufficient documentation

## 2013-07-12 DIAGNOSIS — Z0000 Encounter for general adult medical examination without abnormal findings: Secondary | ICD-10-CM | POA: Insufficient documentation

## 2013-07-12 DIAGNOSIS — I10 Essential (primary) hypertension: Secondary | ICD-10-CM | POA: Insufficient documentation

## 2013-07-12 MED ORDER — NYSTATIN 100000 UNIT/GM EX CREA: 1.0000 "application " | TOPICAL_CREAM | Freq: Two times a day (BID) | CUTANEOUS | Status: DC

## 2013-07-12 NOTE — Assessment & Plan Note (Signed)
Microcytic anemia. Suspect iron deficiency. Check iron studies along with cbc.

## 2013-07-12 NOTE — Progress Notes (Signed)
Subjective:    Patient ID: Judith Drake, female    DOB: 1971/06/03, 42 y.o.   MRN: [id number]  HPI  Patient is a pleasant 42 y/o female who presents to clinic to establish care. She reports not having a PCP. For acute issues she would go to urgent care. She reports the following:  1. HTN - multiple episodes of elevated readings with recommendations to establish with PCP. She reports that she would take the medication that was prescribed but once it would run out she would not follow up. She has been prescribed lisinopril with HCTZ in the past.  2. Etoh abuse - She admits that she drinks heavily on a daily basis. She reports drinking approximately 6 drinks every evening.  3. Tobacco abuse - Judith Drake only smokes when she drinks. She reports smoking approximately 6-8 cigarettes daily.   3. Itching underneath bilateral arms on axilla. She reports these symptoms have been ongoing for a couple of months. They prescribed a steroid cream (triamcinolone) but this has not helped her symptoms.  4. Fatigue - this has been ongoing for some time. 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 She feels exhausted and overwhelmed at times.      Past Medical History  Diagnosis Date  . Hypertension   . GERD (gastroesophageal reflux disease)      Past Surgical History  Procedure Laterality Date  . Tubal ligation    . Elbow surgery      1997      Family History  Problem Relation Age of Onset  . Cancer Mother 67    Lung   . Hyperlipidemia Mother   . Hypertension Mother   . Stroke Mother   . Kidney disease Mother   . Diabetes Mother   . Heart disease Mother   . Hyperlipidemia Father   . Cancer Paternal Grandfather 3    Colon Cancer - died  . Autism Son      History   Social History  . Marital Status: Married    Spouse Name: Mellody Dance    Number of Children: 2  . Years of Education: 14   Occupational History  . Homemaker     Social History Main Topics   . Smoking status: Current Some Day Smoker    Types: Cigarettes  . Smokeless tobacco: Not on file  . Alcohol Use: Yes     Comment: 6 drinks daily  . Drug Use: No  . Sexual Activity: Yes    Partners: Male    Birth Control/ Protection: Surgical   Other Topics Concern  . Not on file   Social History Narrative   Judith Drake grew up in [place]. She attended [employer] for a certificate in accounting. She currently lives a home with her husband and 2 boys. Her oldest son has autism. She enjoys going out with her family.       Review of Systems  Constitutional: Positive for fatigue. Negative for fever, chills and appetite change.  HENT: Negative.   Eyes: Negative.   Respiratory: Negative.   Cardiovascular: Negative.   Gastrointestinal: Positive for constipation.       Occassional constipation  Endocrine: Negative.   Genitourinary: Negative.   Musculoskeletal: Negative.   Skin: Positive for rash.       Bilateral axilla  Allergic/Immunologic: Negative.   Neurological: Negative.   Hematological: Negative.   Psychiatric/Behavioral: Negative for behavioral problems, confusion and agitation. The patient is nervous/anxious.  Objective:   Physical Exam  Constitutional: She is oriented to person, place, and time. She appears well-developed and well-nourished. No distress.  HENT:  Head: Normocephalic and atraumatic.  Right Ear: External ear normal.  Left Ear: External ear normal.  Nose: Nose normal.  Mouth/Throat: Oropharynx is clear and moist.  Eyes: Conjunctivae and EOM are normal. Pupils are equal, round, and reactive to light.  Neck: Normal range of motion. Neck supple. No tracheal deviation present. No thyromegaly present.  Cardiovascular: Normal rate, regular rhythm, normal heart sounds and intact distal pulses.  Exam reveals no gallop and no friction rub.   No murmur heard. Pulmonary/Chest: Effort normal and breath sounds normal. No respiratory distress.  She has no wheezes. She has no rales.  Abdominal: Soft. Bowel sounds are normal. She exhibits no distension and no mass. There is no tenderness. There is no rebound and no guarding.  Musculoskeletal: Normal range of motion. She exhibits no edema and no tenderness.  Lymphadenopathy:    She has no cervical adenopathy.  Neurological: She is alert and oriented to person, place, and time. She has normal reflexes. No cranial nerve deficit. Coordination normal.  Skin: Skin is warm and dry.  Psychiatric: She has a normal mood and affect. Her behavior is normal. Judgment and thought content normal.    BP 132/90  Pulse 88  Temp(Src) 98.2 F (36.8 C) (Oral)  Wt 160 lb 12 oz (72.916 kg)  SpO2 99%       Assessment & Plan:

## 2013-07-12 NOTE — Patient Instructions (Signed)
  Thank you for choosing  at [facility] for your health care needs.  Please have your labs drawn prior to leaving the office.  The results will be available through MyChart for your convenience. Please remember to activate this. The activation code is located at the end of this form.  

## 2013-07-12 NOTE — Assessment & Plan Note (Signed)
Reports she only smokes when she drinks. She is trying to "get healthy". Recommend smoking cessation. Assistance available should she need it. Continue to encourage smoking cessation

## 2013-07-12 NOTE — Assessment & Plan Note (Addendum)
Normal physical exam excluding breast, pelvic/PAP and problems listed separately. Check labs: cbc w/diff, cmet, tsh, hgbA1c

## 2013-07-12 NOTE — Assessment & Plan Note (Signed)
Bilateral axilla. Start nystatin bid x 5-7 days

## 2013-07-12 NOTE — Assessment & Plan Note (Signed)
Suspect multifactorial - family stress, anemia (recent blood work in ED), excessive alcohol consumption, ? thyroid. Recheck labs and treat accordingly - cbc w/diff, cmet, tsh

## 2013-07-12 NOTE — Assessment & Plan Note (Signed)
6 drinks nightly. Patient reports feeling stressed. Using alcohol to relax and unwind after she puts her son to bed. She understands this is excessive and presents to clinic as a first step towards "getting healthy". She is aware that one of the first steps is admitting her problem and then developing a plan. May need outside help from organization such as AA. Will check labs - liver panel, cbc, Continue to follow.

## 2013-07-12 NOTE — Assessment & Plan Note (Signed)
Patient had recent visit to the ED at [facility]. Abnormal cbc, bmet. I will recheck the abnormal values. Patient does not have insurance so we will keep lab work to a minimum and check essential. Continue to follow.

## 2013-07-12 NOTE — Assessment & Plan Note (Signed)
Restart lisinopril 10 mg and HCTZ 12.5 mg once obtain results of metabolic panel.

## 2013-07-12 NOTE — Progress Notes (Signed)
Pre-visit discussion using our clinic review tool. No additional management support is needed unless otherwise documented below in the visit note.  

## 2013-07-13 ENCOUNTER — Other Ambulatory Visit: Payer: Self-pay

## 2013-07-13 ENCOUNTER — Encounter: Payer: Self-pay | Admitting: Adult Health

## 2013-07-13 ENCOUNTER — Other Ambulatory Visit: Payer: Self-pay | Admitting: Adult Health

## 2013-07-13 ENCOUNTER — Telehealth: Payer: Self-pay | Admitting: *Deleted

## 2013-07-13 DIAGNOSIS — D509 Iron deficiency anemia, unspecified: Secondary | ICD-10-CM

## 2013-07-13 LAB — COMPREHENSIVE METABOLIC PANEL
ALT: 43 U/L — ABNORMAL HIGH (ref 0–35)
AST: 53 U/L — ABNORMAL HIGH (ref 0–37)
Albumin: 4.5 g/dL (ref 3.5–5.2)
CO2: 29 mEq/L (ref 19–32)
Chloride: 100 mEq/L (ref 96–112)
GFR: 101.25 mL/min (ref >60.00)
Glucose, Bld: 92 mg/dL (ref 70–99)
Potassium: 4.3 mEq/L (ref 3.5–5.1)
Sodium: 138 mEq/L (ref 135–145)
Total Bilirubin: 0.5 mg/dL (ref 0.3–1.2)
Total Protein: 8 g/dL (ref 6.0–8.3)

## 2013-07-13 LAB — CBC WITH DIFFERENTIAL/PLATELET
Basophils Absolute: 0.1 10*3/uL (ref 0.0–0.1)
Eosinophils Relative: 3.6 % (ref 0.0–5.0)
HCT: 25.8 % — ABNORMAL LOW (ref 36.0–46.0)
Lymphocytes Relative: 28.8 % (ref 12.0–46.0)
Monocytes Relative: 6.4 % (ref 3.0–12.0)
Neutrophils Relative %: 59.7 % (ref 43.0–77.0)
Platelets: 343 10*3/uL (ref 150.0–400.0)
RBC: 3.56 Mil/uL — ABNORMAL LOW (ref 3.87–5.11)
RDW: 19 % — ABNORMAL HIGH (ref 11.5–14.6)
WBC: 8 10*3/uL (ref 4.5–10.5)

## 2013-07-13 LAB — FERRITIN: Ferritin: 7.8 ng/mL — ABNORMAL LOW (ref 10.0–291.0)

## 2013-07-13 MED ORDER — FERROUS SULFATE 325 (65 FE) MG PO TABS: 325.0000 mg | ORAL_TABLET | Freq: Two times a day (BID) | ORAL | Status: DC

## 2013-07-13 NOTE — Telephone Encounter (Signed)
Lori @[facility], called to report critical Hgb 8.0. Raquel notified.

## 2013-07-14 ENCOUNTER — Encounter: Payer: Self-pay | Admitting: Adult Health

## 2013-07-14 ENCOUNTER — Other Ambulatory Visit: Payer: Self-pay | Admitting: Adult Health

## 2013-07-14 MED ORDER — HYDROCHLOROTHIAZIDE 25 MG PO TABS: ORAL_TABLET | ORAL | Status: DC

## 2013-07-19 ENCOUNTER — Ambulatory Visit: Payer: Self-pay | Admitting: Internal Medicine

## 2013-07-19 LAB — CANCER CENTER HEMOGLOBIN: HGB: 9.5 g/dL — ABNORMAL LOW (ref 12.0–16.0)

## 2013-07-31 LAB — CANCER CENTER HEMOGLOBIN: HGB: 10.3 g/dL — ABNORMAL LOW (ref 12.0–16.0)

## 2013-08-07 ENCOUNTER — Ambulatory Visit: Payer: Self-pay | Admitting: Internal Medicine

## 2013-08-16 LAB — CBC CANCER CENTER
Basophil #: 0.1 x10 3/mm (ref 0.0–0.1)
Basophil %: 1.3 %
Eosinophil #: 0.3 x10 3/mm (ref 0.0–0.7)
Eosinophil %: 5 %
HGB: 12 g/dL (ref 12.0–16.0)
Lymphocyte #: 1.6 x10 3/mm (ref 1.0–3.6)
MCH: 27.8 pg (ref 26.0–34.0)
MCHC: 31.9 g/dL — ABNORMAL LOW (ref 32.0–36.0)
MCV: 87 fL (ref 80–100)
Neutrophil %: 63.8 %
Platelet: 259 x10 3/mm (ref 150–440)
RBC: 4.34 10*6/uL (ref 3.80–5.20)
RDW: 29 % — ABNORMAL HIGH (ref 11.5–14.5)
WBC: 6.5 x10 3/mm (ref 3.6–11.0)

## 2013-08-17 ENCOUNTER — Telehealth: Payer: Self-pay | Admitting: *Deleted

## 2013-08-17 NOTE — Telephone Encounter (Signed)
Patient is being discharged from his care so Dr, Lorre Nick wanted to make sure that patient is followed for BP, patient has no future appointments scheduled since Raquel Rey NP is out would you wan t me to schedule patient for a nurse visit to check BP and possible follow up with you or one of the other MD's? Please advise.

## 2013-08-17 NOTE — Telephone Encounter (Signed)
Yes RN visit to check BP and pulse and then we'll go from there

## 2013-08-18 NOTE — Telephone Encounter (Signed)
Left message for patient to return my call.

## 2013-08-23 ENCOUNTER — Telehealth: Payer: Self-pay | Admitting: Adult Health

## 2013-08-23 NOTE — Telephone Encounter (Signed)
nystatin cream (MYCOSTATIN)

## 2013-08-25 NOTE — Telephone Encounter (Signed)
Pt has appt with Raquel 09/13/13 for BP followup

## 2013-09-07 ENCOUNTER — Ambulatory Visit: Payer: Self-pay | Admitting: Internal Medicine

## 2013-09-13 ENCOUNTER — Ambulatory Visit: Payer: Self-pay | Admitting: Adult Health

## 2013-09-20 ENCOUNTER — Ambulatory Visit: Payer: Self-pay | Admitting: Adult Health

## 2013-10-04 ENCOUNTER — Ambulatory Visit: Payer: Self-pay | Admitting: Adult Health

## 2013-10-11 ENCOUNTER — Ambulatory Visit (INDEPENDENT_AMBULATORY_CARE_PROVIDER_SITE_OTHER): Payer: 59 | Admitting: Adult Health

## 2013-10-11 ENCOUNTER — Encounter: Payer: Self-pay | Admitting: Adult Health

## 2013-10-11 VITALS — BP 164/98 | HR 95 | Resp 12 | Wt 162.0 lb

## 2013-10-11 DIAGNOSIS — Z7189 Other specified counseling: Secondary | ICD-10-CM

## 2013-10-11 DIAGNOSIS — D649 Anemia, unspecified: Secondary | ICD-10-CM

## 2013-10-11 DIAGNOSIS — F17200 Nicotine dependence, unspecified, uncomplicated: Secondary | ICD-10-CM

## 2013-10-11 DIAGNOSIS — F1010 Alcohol abuse, uncomplicated: Secondary | ICD-10-CM

## 2013-10-11 DIAGNOSIS — R748 Abnormal levels of other serum enzymes: Secondary | ICD-10-CM

## 2013-10-11 DIAGNOSIS — R899 Unspecified abnormal finding in specimens from other organs, systems and tissues: Secondary | ICD-10-CM

## 2013-10-11 DIAGNOSIS — I10 Essential (primary) hypertension: Secondary | ICD-10-CM

## 2013-10-11 DIAGNOSIS — B372 Candidiasis of skin and nail: Secondary | ICD-10-CM

## 2013-10-11 DIAGNOSIS — R6889 Other general symptoms and signs: Secondary | ICD-10-CM

## 2013-10-11 DIAGNOSIS — Z716 Tobacco abuse counseling: Secondary | ICD-10-CM

## 2013-10-11 LAB — COMPREHENSIVE METABOLIC PANEL
ALT: 101 U/L — ABNORMAL HIGH (ref 0–35)
AST: 141 U/L — ABNORMAL HIGH (ref 0–37)
Albumin: 4.7 g/dL (ref 3.5–5.2)
Alkaline Phosphatase: 139 U/L — ABNORMAL HIGH (ref 39–117)
BUN: 7 mg/dL (ref 6–23)
CHLORIDE: 94 meq/L — AB (ref 96–112)
CO2: 31 meq/L (ref 19–32)
CREATININE: 0.6 mg/dL (ref 0.4–1.2)
Calcium: 10 mg/dL (ref 8.4–10.5)
GFR: 114.64 mL/min (ref >60.00)
Glucose, Bld: 111 mg/dL — ABNORMAL HIGH (ref 70–99)
Potassium: 4.2 mEq/L (ref 3.5–5.1)
Sodium: 138 mEq/L (ref 135–145)
TOTAL PROTEIN: 8 g/dL (ref 6.0–8.3)
Total Bilirubin: 0.7 mg/dL (ref 0.3–1.2)

## 2013-10-11 LAB — CBC WITH DIFFERENTIAL/PLATELET
Basophils Absolute: 0 10*3/uL (ref 0.0–0.1)
Basophils Relative: 0.4 % (ref 0.0–3.0)
Eosinophils Absolute: 0.2 10*3/uL (ref 0.0–0.7)
Eosinophils Relative: 2.6 % (ref 0.0–5.0)
HCT: 40 % (ref 36.0–46.0)
HEMOGLOBIN: 13.4 g/dL (ref 12.0–15.0)
Lymphocytes Relative: 31.6 % (ref 12.0–46.0)
Lymphs Abs: 2.8 10*3/uL (ref 0.7–4.0)
MCHC: 33.4 g/dL (ref 30.0–36.0)
MCV: 98.8 fl (ref 78.0–100.0)
Monocytes Absolute: 0.6 10*3/uL (ref 0.1–1.0)
Monocytes Relative: 7.4 % (ref 3.0–12.0)
NEUTROS ABS: 5.1 10*3/uL (ref 1.4–7.7)
Neutrophils Relative %: 58 % (ref 43.0–77.0)
Platelets: 311 10*3/uL (ref 150.0–400.0)
RBC: 4.05 Mil/uL (ref 3.87–5.11)
RDW: 13.5 % (ref 11.5–14.6)
WBC: 8.7 10*3/uL (ref 4.5–10.5)

## 2013-10-11 MED ORDER — NYSTATIN 100000 UNIT/GM EX CREA: 1.0000 "application " | TOPICAL_CREAM | Freq: Two times a day (BID) | CUTANEOUS | Status: DC

## 2013-10-11 MED ORDER — LISINOPRIL 10 MG PO TABS: 10.0000 mg | ORAL_TABLET | Freq: Every day | ORAL | Status: DC

## 2013-10-11 NOTE — Progress Notes (Signed)
Pre visit review using our clinic review tool, if applicable. No additional management support is needed unless otherwise documented below in the visit note. 

## 2013-10-11 NOTE — Patient Instructions (Signed)
  Please have your labs drawn prior to leaving the office.  I will send you the results once they are available and I have had a chance to review them.  Start lisinopril 10 mg daily. Continue HCTZ one half tablet daily.  Nystatin cream to affected areas. Once the area is resolved try using cornstarch powder to keep the area dry. Do not use powder anywhere near the genitalia.  Recommend complete smoking cessation.  Your liver enzymes were elevated which I am rechecking. Recommend no alcohol.  Return for followup in one month.

## 2013-10-11 NOTE — Progress Notes (Signed)
Patient ID: Judith Drake, female   DOB: 1971-04-20, 43 y.o.   MRN: [id number]    Subjective:    Patient ID: Judith Drake, female    DOB: [birth year], 43 y.o.   MRN: [id number]  HPI  Patient is a pleasant 44 year old female who presents to clinic for followup of hypertension and abnormal labs. She was started on HCTZ 12.5 mg daily. She has been checking her blood pressure when she goes to [facility]. This has not been on a regular basis. She reports blood pressure is still elevated. Blood pressure is elevated during clinic today. We discussed ongoing alcohol use. She has decreased alcohol consumption to 3 times a week; however, she is consuming 3-4 glasses of wine each of these days. She reports having trouble sleeping and this is why she reports drinking the alcohol, to help her relax. As far as smoking, she has decreased the amount of cigarettes that she is smoking. Now approximately one to 3 cigarettes daily. Patient reports seeing Dr. Inez Pilgrim for severe anemia. He switched her iron supplements. She also has appointment with GYN for menorrhagia which is further contributing to her anemia.   Past Medical History  Diagnosis Date  . Hypertension   . GERD (gastroesophageal reflux disease)     Current Outpatient Prescriptions on File Prior to Visit  Medication Sig Dispense Refill  . hydrochlorothiazide (HYDRODIURIL) 25 MG tablet Take 1/2 tablet in the morning.  30 tablet  3   No current facility-administered medications on file prior to visit.     Review of Systems  Constitutional: Negative.   Respiratory: Negative.   Cardiovascular: Negative.   Genitourinary: Negative.   Neurological: Negative.   Psychiatric/Behavioral: Positive for sleep disturbance. The patient is nervous/anxious.   All other systems reviewed and are negative.       Objective:  BP 164/98  Pulse 95  Resp 12  Wt 162 lb (73.483 kg)  SpO2 98%   Physical Exam  Constitutional: She is oriented to person, place,  and time. No distress.  43 y/o female  Cardiovascular: Normal rate, regular rhythm, normal heart sounds and intact distal pulses.  Exam reveals no gallop and no friction rub.   No murmur heard. Pulmonary/Chest: Effort normal and breath sounds normal. No respiratory distress. She has no wheezes. She has no rales.  Musculoskeletal: Normal range of motion. She exhibits no edema.  Neurological: She is alert and oriented to person, place, and time.  Skin:  Rash underneath her arms. Treated with mycostatin with good results but returned.  Psychiatric: She has a normal mood and affect. Her behavior is normal. Judgment and thought content normal.       Assessment & Plan:   1. HTN (hypertension) Not well controlled. Add lisinopril 10 mg daily. Continue HCTZ 12.5 mg  - CBC with Differential - Comprehensive metabolic panel  2. Abnormal liver enzymes Decreased ETOH but still consuming too much. This was discussed in detail with pt. With her current elevated liver enzymes recommend no alcohol.  - Comprehensive metabolic panel  3. Tobacco abuse counseling Decreased amount of cigarettes. Recommend complete smoking cessation.  4. Yeast infection of the skin Mycostatin worked but once she stopped symptoms reappeared. Try second round. Once clear, recommend cornstarch powder to keep area under her arms dry. If persists will refer to Derm  5. ETOH abuse Decreased ETOH but still consuming too much. This was discussed in detail with pt. With her current elevated liver enzymes recommend no alcohol.  7. Anemia  She saw Dr. Inez Pilgrim in November for Hgb 8. He changed her iron supplement and now hgb up to 12. Will recheck today to see if maintaining. Pt has heavy menstrual cycles which is contributing to her problem with anemia. Continue iron supplement.  Return in about 1 month (around 11/08/2013) for HTN, new med added Lisinopril.

## 2013-10-12 ENCOUNTER — Other Ambulatory Visit: Payer: Self-pay | Admitting: Adult Health

## 2013-10-12 ENCOUNTER — Encounter: Payer: Self-pay | Admitting: Adult Health

## 2013-10-12 DIAGNOSIS — R740 Nonspecific elevation of levels of transaminase and lactic acid dehydrogenase [LDH]: Principal | ICD-10-CM

## 2013-10-12 DIAGNOSIS — R7401 Elevation of levels of liver transaminase levels: Secondary | ICD-10-CM

## 2013-10-12 NOTE — Progress Notes (Signed)
Worsening liver enzymes. Suspect alcohol related. Labs ordered.

## 2013-10-12 NOTE — Progress Notes (Signed)
Pt replied to [employer], has not called to schedule lab yet.

## 2013-10-13 ENCOUNTER — Telehealth: Payer: Self-pay | Admitting: Adult Health

## 2013-10-13 NOTE — Telephone Encounter (Signed)
Relevant patient education assigned to patient using Emmi. ° °

## 2013-10-18 ENCOUNTER — Ambulatory Visit: Payer: Self-pay | Admitting: Adult Health

## 2013-10-18 IMAGING — US ABDOMEN ULTRASOUND
1 series · 14 of 25 positions shown · non-contrast
Comparison: None.

CLINICAL DATA: Elevated liver function enzymes.

EXAM:
ULTRASOUND ABDOMEN COMPLETE

[Series 1: abdomen ultrasound · 0.36mm/px · 14 of 88 slices shown]
[im 1/88]
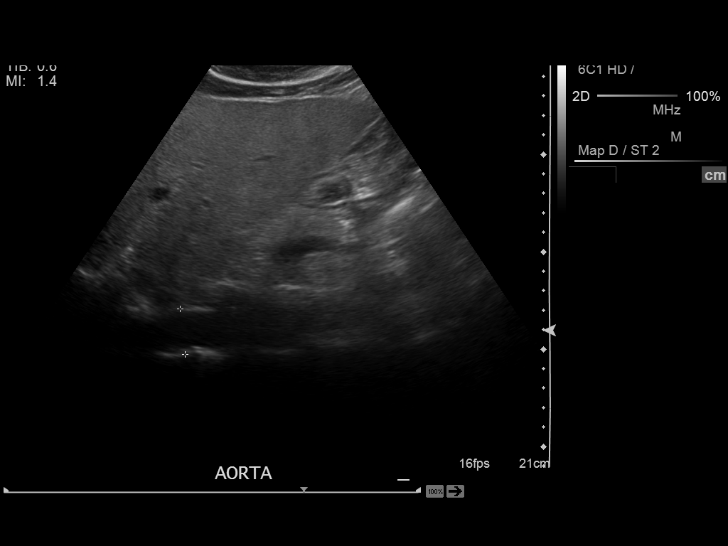
[im 8/88]
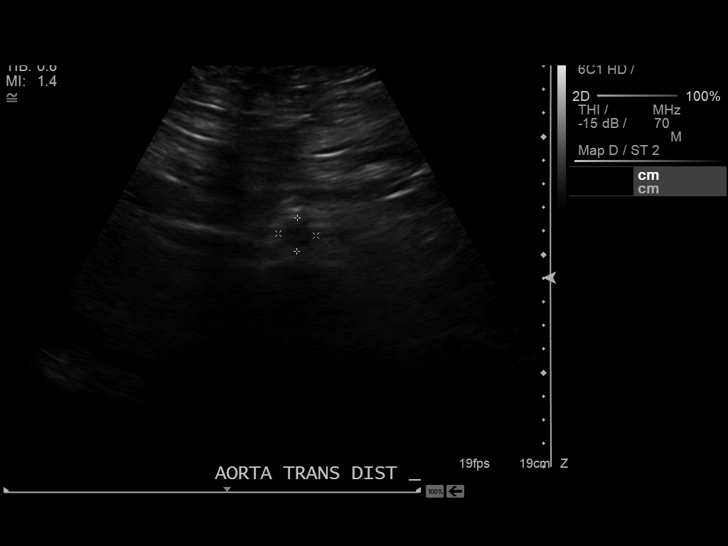
[im 15/88]
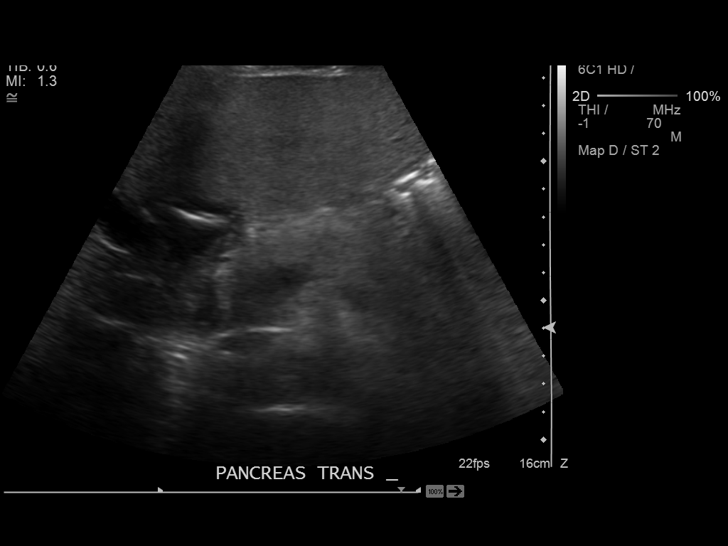
[im 22/88]
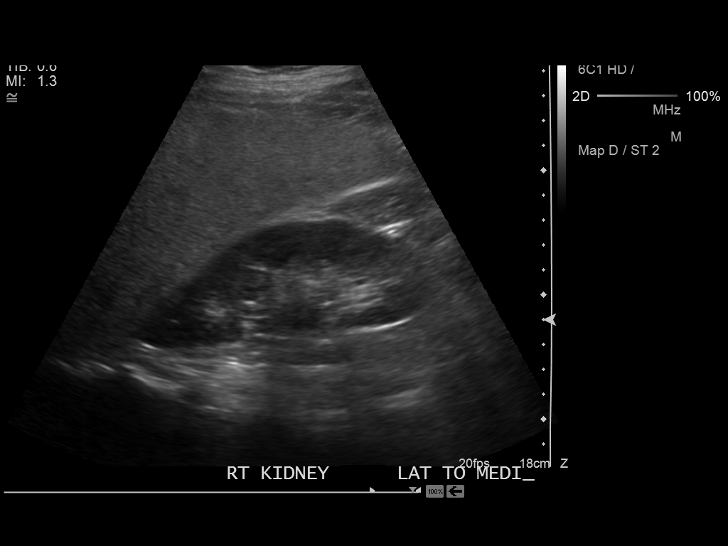
[im 30/88]
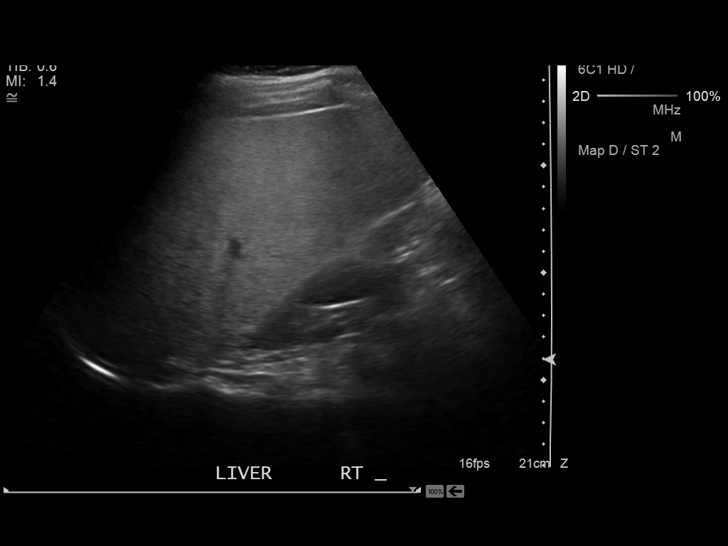
[im 33/88]
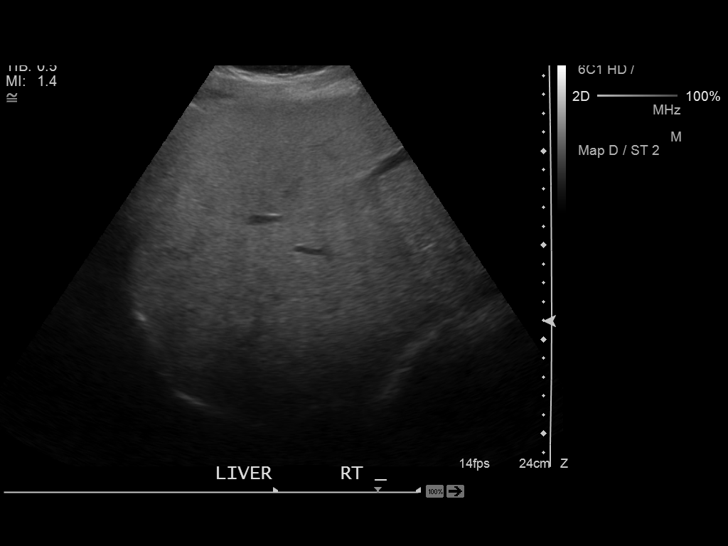
[im 40/88]
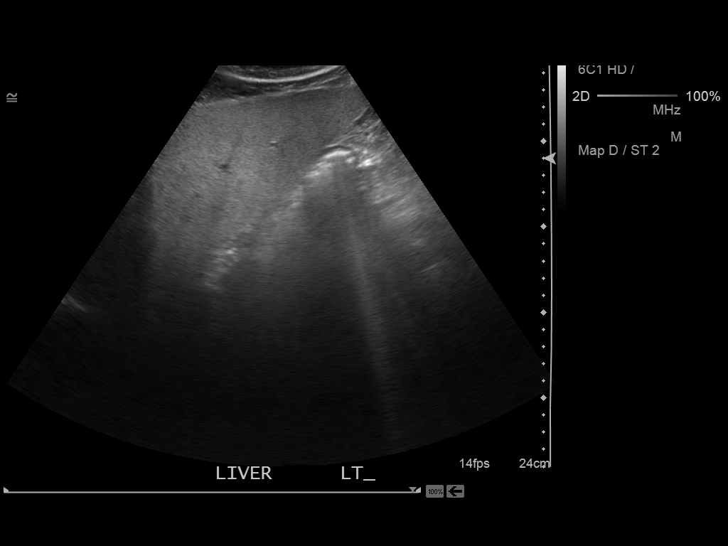
[im 48/88]
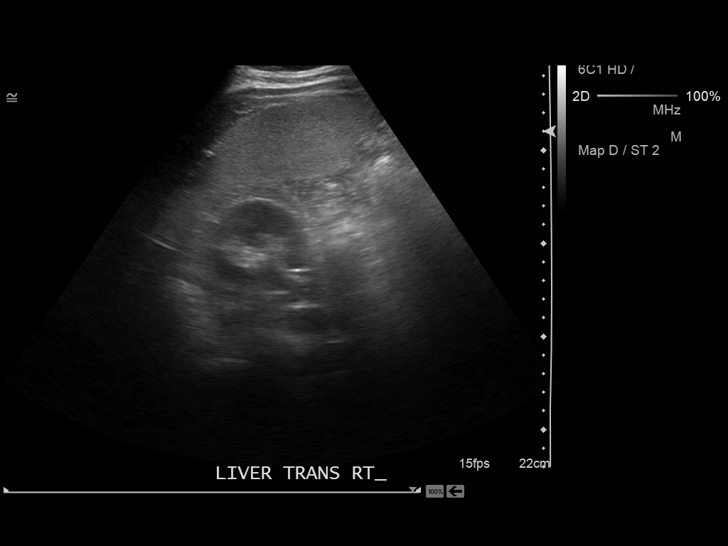
[im 55/88]
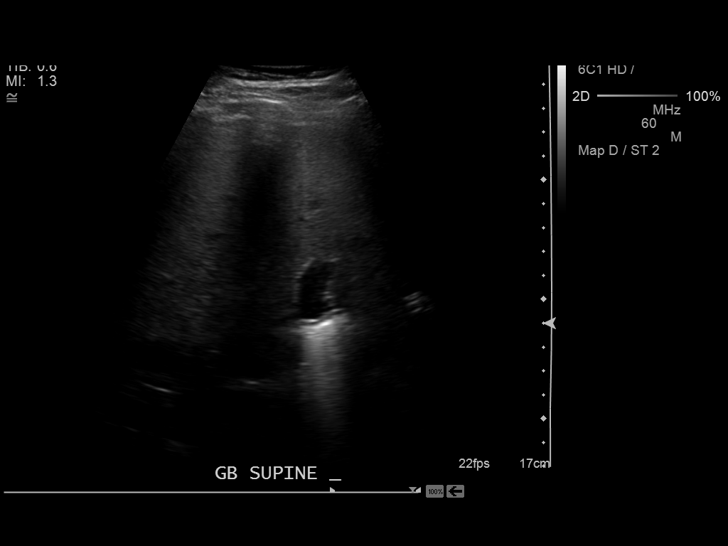
[im 59/88]
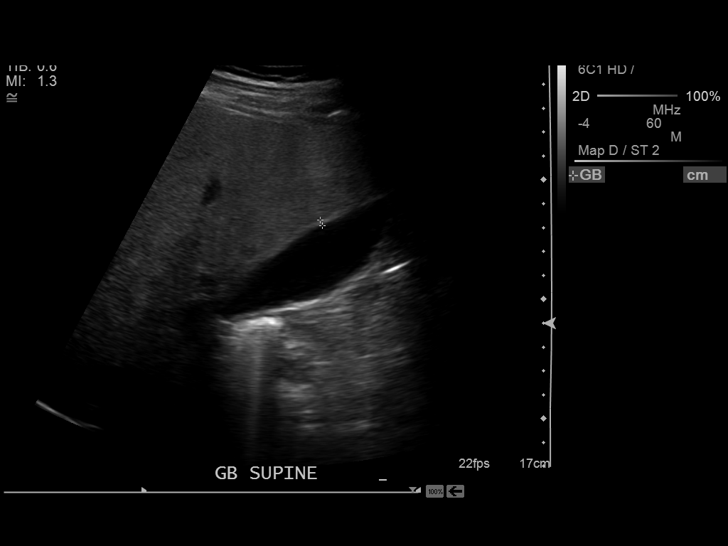
[im 66/88]
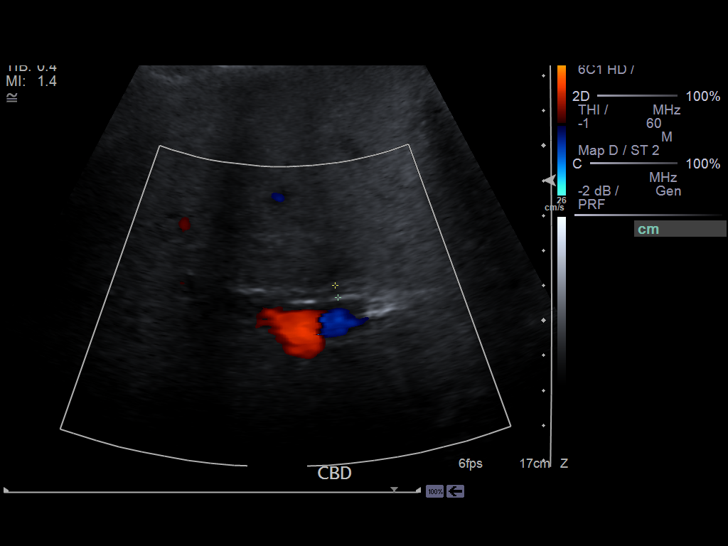
[im 73/88]
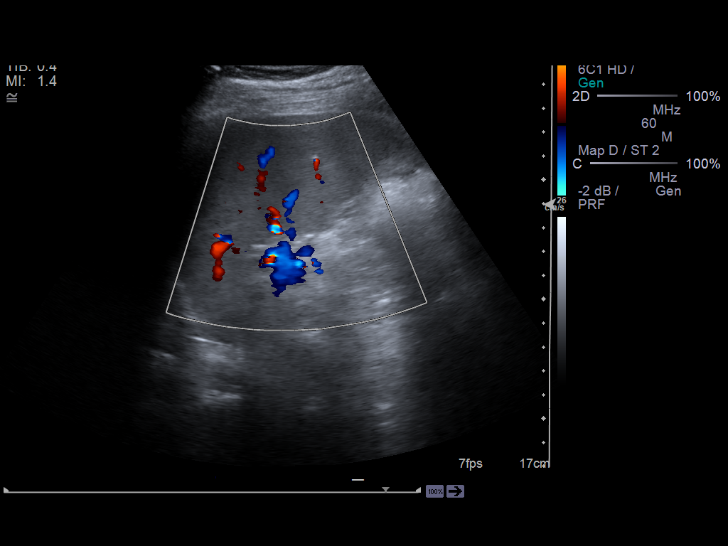
[im 80/88]
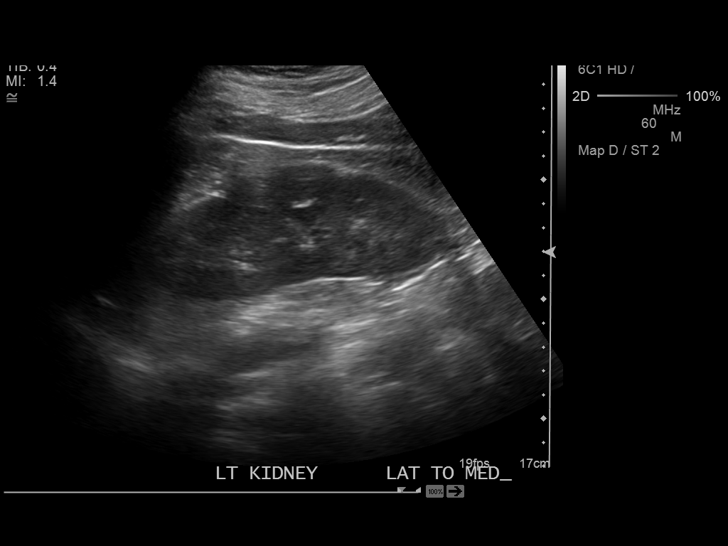
[im 88/88]
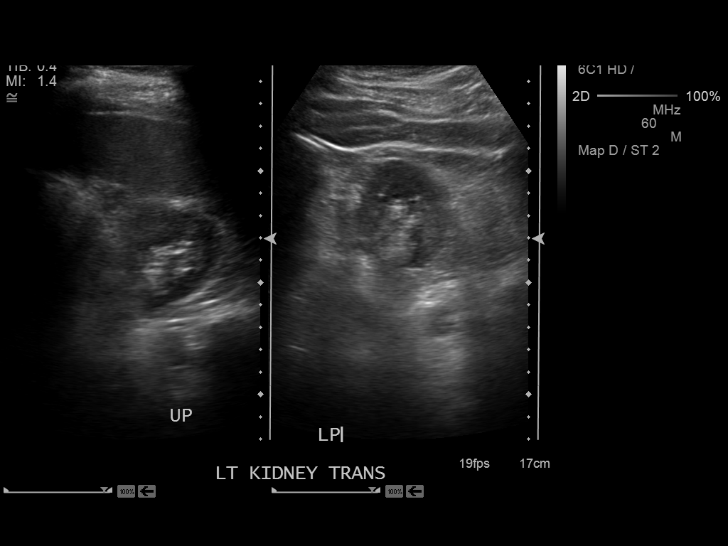

[14 of 25 positions shown; findings below may reference images not displayed]

FINDINGS: Gallbladder:

No gallstones or wall thickening visualized. No sonographic Murphy
sign noted.

Common bile duct:

Diameter: Measures 4.1 mm which is within normal limits.

Liver:

No focal lesion identified. Mildly increased echogenicity of hepatic
parenchyma is noted suggesting fatty infiltration or other diffuse
hepatocellular disease. The liver measures 19.7 cm in diameter
consistent with mild hepatomegaly.

IVC:

No abnormality visualized.

Pancreas:

Visualized portion unremarkable.

Spleen:

Size and appearance within normal limits.

Right Kidney:

Length: 12.4 cm. Echogenicity within normal limits. No mass or
hydronephrosis visualized.

Left Kidney:

Length: 13.1 cm. Echogenicity within normal limits. No mass or
hydronephrosis visualized.

Abdominal aorta:

No aneurysm visualized.

Other findings:

None.
IMPRESSION: Mildly increased echogenicity of hepatic parenchyma is seen
consistent with fatty infiltration or diffuse hepatocellular
disease. Probable mild hepatomegaly is well. No other abnormality
seen in the abdomen.

## 2013-10-20 ENCOUNTER — Encounter: Payer: Self-pay | Admitting: *Deleted

## 2013-11-06 ENCOUNTER — Encounter: Payer: Self-pay | Admitting: Adult Health

## 2013-11-10 ENCOUNTER — Other Ambulatory Visit (INDEPENDENT_AMBULATORY_CARE_PROVIDER_SITE_OTHER): Payer: 59

## 2013-11-10 ENCOUNTER — Other Ambulatory Visit: Payer: Self-pay | Admitting: Adult Health

## 2013-11-10 DIAGNOSIS — R7401 Elevation of levels of liver transaminase levels: Secondary | ICD-10-CM

## 2013-11-10 DIAGNOSIS — R7402 Elevation of levels of lactic acid dehydrogenase (LDH): Secondary | ICD-10-CM

## 2013-11-10 DIAGNOSIS — R899 Unspecified abnormal finding in specimens from other organs, systems and tissues: Secondary | ICD-10-CM

## 2013-11-10 DIAGNOSIS — R6889 Other general symptoms and signs: Secondary | ICD-10-CM

## 2013-11-10 DIAGNOSIS — R740 Nonspecific elevation of levels of transaminase and lactic acid dehydrogenase [LDH]: Principal | ICD-10-CM

## 2013-11-10 LAB — PROTIME-INR
INR: 1.2 ratio — AB (ref 0.8–1.0)
PROTHROMBIN TIME: 12.4 s (ref 10.2–12.4)

## 2013-11-10 LAB — HEMOGLOBIN A1C: HEMOGLOBIN A1C: 5.5 % (ref 4.6–6.5)

## 2013-11-11 ENCOUNTER — Encounter: Payer: Self-pay | Admitting: Adult Health

## 2013-11-11 LAB — HEPATITIS C ANTIBODY: HCV Ab: NEGATIVE

## 2013-11-11 LAB — HEPATITIS A ANTIBODY, IGM: Hep A IgM: NONREACTIVE

## 2013-11-11 LAB — HEPATITIS B SURFACE ANTIGEN: HEP B S AG: NEGATIVE

## 2013-11-11 LAB — HEPATITIS B CORE ANTIBODY, TOTAL: Hep B Core Total Ab: NONREACTIVE

## 2013-11-11 LAB — CERULOPLASMIN: Ceruloplasmin: 40 mg/dL (ref 20–60)

## 2013-11-11 LAB — HEPATITIS B SURFACE ANTIBODY,QUALITATIVE: Hep B S Ab: NEGATIVE

## 2013-11-11 LAB — AMMONIA: Ammonia: 44 umol/L (ref 16–53)

## 2013-11-12 NOTE — Progress Notes (Signed)
Amber, Has pt had the ultrasound yet?

## 2013-11-13 ENCOUNTER — Encounter: Payer: Self-pay | Admitting: Adult Health

## 2013-11-13 LAB — ANTI-SMITH ANTIBODY: ENA SM Ab Ser-aCnc: <1

## 2013-11-13 LAB — ANA: ANA: NEGATIVE

## 2013-11-15 ENCOUNTER — Encounter: Payer: Self-pay | Admitting: Adult Health

## 2013-11-15 ENCOUNTER — Ambulatory Visit: Payer: Self-pay | Admitting: Adult Health

## 2013-11-15 LAB — ANTI-MICROSOMAL ANTIBODY LIVER / KIDNEY: LKM1 Ab: <=20 U (ref <=20.0)

## 2013-11-24 ENCOUNTER — Ambulatory Visit (INDEPENDENT_AMBULATORY_CARE_PROVIDER_SITE_OTHER): Payer: 59 | Admitting: Adult Health

## 2013-11-24 ENCOUNTER — Encounter: Payer: Self-pay | Admitting: Adult Health

## 2013-11-24 VITALS — BP 100/58 | HR 78 | Temp 98.4°F | Resp 14 | Wt 154.0 lb

## 2013-11-24 DIAGNOSIS — R748 Abnormal levels of other serum enzymes: Secondary | ICD-10-CM

## 2013-11-24 DIAGNOSIS — F32A Depression, unspecified: Secondary | ICD-10-CM | POA: Insufficient documentation

## 2013-11-24 DIAGNOSIS — F329 Major depressive disorder, single episode, unspecified: Secondary | ICD-10-CM

## 2013-11-24 DIAGNOSIS — I10 Essential (primary) hypertension: Secondary | ICD-10-CM

## 2013-11-24 DIAGNOSIS — F3289 Other specified depressive episodes: Secondary | ICD-10-CM

## 2013-11-24 HISTORY — DX: Abnormal levels of other serum enzymes: R74.8

## 2013-11-24 LAB — BASIC METABOLIC PANEL
BUN: 17 mg/dL (ref 6–23)
CALCIUM: 10.4 mg/dL (ref 8.4–10.5)
CO2: 31 meq/L (ref 19–32)
CREATININE: 1 mg/dL (ref 0.4–1.2)
Chloride: 97 mEq/L (ref 96–112)
GFR: 66.29 mL/min (ref >60.00)
GLUCOSE: 107 mg/dL — AB (ref 70–99)
Potassium: 4.6 mEq/L (ref 3.5–5.1)
Sodium: 137 mEq/L (ref 135–145)

## 2013-11-24 LAB — HEPATIC FUNCTION PANEL
ALBUMIN: 5 g/dL (ref 3.5–5.2)
ALT: 51 U/L — ABNORMAL HIGH (ref 0–35)
AST: 57 U/L — ABNORMAL HIGH (ref 0–37)
Alkaline Phosphatase: 82 U/L (ref 39–117)
Bilirubin, Direct: 0.1 mg/dL (ref 0.0–0.3)
TOTAL PROTEIN: 8.5 g/dL — AB (ref 6.0–8.3)
Total Bilirubin: 0.7 mg/dL (ref 0.3–1.2)

## 2013-11-24 MED ORDER — FLUOXETINE HCL 20 MG PO TABS: 20.0000 mg | ORAL_TABLET | Freq: Every day | ORAL | Status: DC

## 2013-11-24 MED ORDER — LISINOPRIL-HYDROCHLOROTHIAZIDE 10-12.5 MG PO TABS: 1.0000 | ORAL_TABLET | Freq: Every day | ORAL | Status: DC

## 2013-11-24 NOTE — Progress Notes (Signed)
Pre visit review using our clinic review tool, if applicable. No additional management support is needed unless otherwise documented below in the visit note. 

## 2013-11-24 NOTE — Progress Notes (Signed)
Patient ID: Judith Drake, female   DOB: Aug 30, 1971, 43 y.o.   MRN: [id number]    Subjective:    Patient ID: Judith Drake, female    DOB: May 16, 1971, 43 y.o.   MRN: [id number]  HPI  Pt is a pleasant 43 y/o female who presents to clinic for f/u HTN, elevated liver enzymes and to discuss feeling depressed. Pertaining to her blood pressure, she has been taking her medication regularly. Her blood pressure is very well controlled. Pertaining to her elevated liver enzymes, she has been sent for ultrasound and extensive testing all of which has come back normal. She is a heavy drinker and suspect it is ETOH related. She reports cutting back on her alcohol intake. She was drinking daily but now she reports drinking much less. Pertaining to her depression, she has been under considerable amount of stress. Has a special needs child. She recently tested positive for trich. She reports that she has not been with anyone other than her husband. When she confronted her husband he told her he had not had any extramarital sex. He also told her he would come and be tested. She explains that he tested negative for trich. She feels that he initially went to an urgent care and paid cash. Then he went to his PCP and got tested and was negative. He has been giving her a hard time about this. She really feels that he is being dishonest about this. We had spoken about her starting on antidepressants during one of our previous visits. She feels she is ready to start medication.   Past Medical History  Diagnosis Date  . Hypertension   . GERD (gastroesophageal reflux disease)     Current Outpatient Prescriptions on File Prior to Visit  Medication Sig Dispense Refill  . FeFum-FePo-FA-B Cmp-C-Zn-Mn-Cu (TANDEM PLUS) 162-115.2-1 MG CAPS Take 1 capsule by mouth daily.      Marland Kitchen nystatin cream (MYCOSTATIN) Apply 1 application topically 2 (two) times daily.  30 g  1   No current facility-administered medications on file  prior to visit.     Review of Systems  Constitutional: Negative.   HENT: Negative.   Eyes: Negative.   Respiratory: Negative.   Cardiovascular: Negative.   Gastrointestinal: Negative.   Endocrine: Negative.   Genitourinary: Negative.   Musculoskeletal: Negative.   Skin: Negative.   Allergic/Immunologic: Negative.   Neurological: Negative.   Hematological: Negative.   Psychiatric/Behavioral: The patient is nervous/anxious (depression).        Objective:  BP 100/58  Pulse 78  Temp(Src) 98.4 F (36.9 C) (Oral)  Resp 14  Wt 154 lb (69.854 kg)  SpO2 98%  LMP 11/20/2013   Physical Exam  Constitutional: She is oriented to person, place, and time. She appears well-developed and well-nourished. No distress.  HENT:  Head: Normocephalic and atraumatic.  Eyes: Conjunctivae and EOM are normal. Pupils are equal, round, and reactive to light.  Neck: Normal range of motion. Neck supple. No tracheal deviation present.  Cardiovascular: Normal rate, regular rhythm, normal heart sounds and intact distal pulses.  Exam reveals no gallop and no friction rub.   No murmur heard. Pulmonary/Chest: Effort normal and breath sounds normal. No respiratory distress.  Abdominal: She exhibits no mass.  Musculoskeletal: Normal range of motion.  Neurological: She is alert and oriented to person, place, and time. She has normal reflexes.  Skin: Skin is warm and dry.  Psychiatric: She has a normal mood and affect. Her behavior is normal. Judgment and  thought content normal.      Assessment & Plan:   1. HTN (hypertension) Well controlled on medication. Changed prescription to combo pill so that she only has to take one. Check labs. Continue to follow. F/U 3 months - Basic metabolic panel  2. Elevated liver enzymes Labs have been normal. Ultrasound normal. She has a pending referral to GI. I really suspect that her excess ETOH use is the etiology. She has cut back but not quit. Encouraging her to  quit. Check labs  - Hepatic function panel  3. Depression Start prozac 20 mg daily. F/U 6-8 weeks or sooner if needed

## 2013-11-24 NOTE — Patient Instructions (Signed)
  Please have your blood work drawn.  We will contact you with the results once available.  Start prozac 20 mg daily.  I have sent in a new prescription for your blood pressure medication combining the two meds. So you will only need to take 1 tablet.  Return for follow up in 3 months or sooner if necessary.

## 2013-11-28 ENCOUNTER — Encounter: Payer: Self-pay | Admitting: Adult Health

## 2013-12-02 ENCOUNTER — Other Ambulatory Visit: Payer: Self-pay | Admitting: Adult Health

## 2014-02-15 ENCOUNTER — Other Ambulatory Visit: Payer: Self-pay | Admitting: Adult Health

## 2014-02-28 ENCOUNTER — Ambulatory Visit: Payer: 59 | Admitting: Adult Health

## 2014-03-15 ENCOUNTER — Other Ambulatory Visit: Payer: Self-pay | Admitting: Adult Health

## 2014-03-15 NOTE — Telephone Encounter (Signed)
Please advise ok to fill 

## 2014-03-26 ENCOUNTER — Telehealth: Payer: Self-pay | Admitting: Adult Health

## 2014-03-26 ENCOUNTER — Encounter: Payer: Self-pay | Admitting: Adult Health

## 2014-03-26 ENCOUNTER — Ambulatory Visit (INDEPENDENT_AMBULATORY_CARE_PROVIDER_SITE_OTHER): Payer: 59 | Admitting: Adult Health

## 2014-03-26 VITALS — BP 112/82 | HR 87 | Temp 99.3°F | Resp 14 | Wt 148.5 lb

## 2014-03-26 DIAGNOSIS — R899 Unspecified abnormal finding in specimens from other organs, systems and tissues: Secondary | ICD-10-CM

## 2014-03-26 DIAGNOSIS — I10 Essential (primary) hypertension: Secondary | ICD-10-CM

## 2014-03-26 DIAGNOSIS — R6889 Other general symptoms and signs: Secondary | ICD-10-CM

## 2014-03-26 DIAGNOSIS — F329 Major depressive disorder, single episode, unspecified: Secondary | ICD-10-CM

## 2014-03-26 DIAGNOSIS — F32A Depression, unspecified: Secondary | ICD-10-CM

## 2014-03-26 DIAGNOSIS — F3289 Other specified depressive episodes: Secondary | ICD-10-CM

## 2014-03-26 NOTE — Telephone Encounter (Signed)
Relevant patient education assigned to patient using Emmi. ° °

## 2014-03-26 NOTE — Progress Notes (Signed)
Pre visit review using our clinic review tool, if applicable. No additional management support is needed unless otherwise documented below in the visit note. 

## 2014-03-26 NOTE — Progress Notes (Signed)
   Subjective:    Patient ID: Judith Drake, female    DOB: 1971/04/05, 43 y.o.   MRN: [id number]  HPI  43 yo female presents today for follow up of elevated liver enzymes, depression and hypertension. Overall she reports she is feeling good. Reports vivid dreams, but no other side effects from the medications.  1. Has cut down her alcohol intake to 2-3 drinks per week. Indicating that she can go up to 2 weeks at a time without any alcohol. Is challenging because her husband drinks alcohol on a regular basis.   2. States she feels really good and is taking the Prozac as prescribed. Notes that there was a period earlier on where it felt like it was working better and now she feels slightly more irritable. Denies anxiety.  3. Taking her blood pressure medication as prescribed. Denies any other adverse effects or side effects.  Past Medical History  Diagnosis Date  . Hypertension   . GERD (gastroesophageal reflux disease)     Current Outpatient Prescriptions on File Prior to Visit  Medication Sig Dispense Refill  . FLUoxetine (PROZAC) 20 MG tablet TAKE 1 TABLET (20 MG TOTAL) BY MOUTH DAILY.  30 tablet  6  . lisinopril-hydrochlorothiazide (PRINZIDE,ZESTORETIC) 10-12.5 MG per tablet Take 1 tablet by mouth daily.  30 tablet  6   No current facility-administered medications on file prior to visit.     Review of Systems  Constitutional: Negative.   HENT: Negative.   Eyes: Negative.   Respiratory: Negative.   Cardiovascular: Negative.   Gastrointestinal: Negative.   Genitourinary: Negative.   Neurological: Negative.   Hematological: Negative.   Psychiatric/Behavioral: Negative.   All other systems reviewed and are negative.      Objective:  BP 112/82  Pulse 87  Temp(Src) 99.3 F (37.4 C) (Oral)  Resp 14  Wt 148 lb 8 oz (67.359 kg)  SpO2 97%   Physical Exam  Constitutional: She is oriented to person, place, and time.  43 year old female who appears her stated age,  seated in chair in NAD. Speech is clear and answers questions appropriately. Mood and affect are appropriate for the situation.   Cardiovascular: Normal rate, regular rhythm and normal heart sounds.   Pulmonary/Chest: Effort normal and breath sounds normal.  Abdominal: Soft. Bowel sounds are normal.  Neurological: She is alert and oriented to person, place, and time.  Skin: Skin is warm and dry.  Psychiatric: She has a normal mood and affect. Her behavior is normal. Judgment and thought content normal.       Assessment & Plan:   1. Abnormal laboratory test Liver function appeared improved previously. Anticipate improved liver function with decreased alcohol consumption. Will follow up based on lab results. - Basic Metabolic Panel (BMET) - Hepatic function panel  2. Essential hypertension Blood pressure is within goal range. Continue lisinopril-hydrochlorathiazide. - Basic Metabolic Panel (BMET)  3. Depression Overall feeling well. Consider increasing Prozac to 40 mg pending liver panel results.

## 2014-03-27 ENCOUNTER — Encounter: Payer: Self-pay | Admitting: Adult Health

## 2014-03-27 ENCOUNTER — Other Ambulatory Visit: Payer: Self-pay | Admitting: Adult Health

## 2014-03-27 DIAGNOSIS — R740 Nonspecific elevation of levels of transaminase and lactic acid dehydrogenase [LDH]: Principal | ICD-10-CM

## 2014-03-27 DIAGNOSIS — R7401 Elevation of levels of liver transaminase levels: Secondary | ICD-10-CM

## 2014-03-27 LAB — HEPATIC FUNCTION PANEL
ALT: 54 U/L — ABNORMAL HIGH (ref 0–35)
AST: 61 U/L — ABNORMAL HIGH (ref 0–37)
Albumin: 4.7 g/dL (ref 3.5–5.2)
Alkaline Phosphatase: 88 U/L (ref 39–117)
BILIRUBIN TOTAL: 0.9 mg/dL (ref 0.2–1.2)
Bilirubin, Direct: 0.1 mg/dL (ref 0.0–0.3)
TOTAL PROTEIN: 8.2 g/dL (ref 6.0–8.3)

## 2014-03-27 LAB — BASIC METABOLIC PANEL WITH GFR
BUN: 11 mg/dL (ref 6–23)
CO2: 32 meq/L (ref 19–32)
Calcium: 10.1 mg/dL (ref 8.4–10.5)
Chloride: 98 meq/L (ref 96–112)
Creatinine, Ser: 0.8 mg/dL (ref 0.4–1.2)
GFR: 79.07 mL/min
Glucose, Bld: 82 mg/dL (ref 70–99)
Potassium: 4.4 meq/L (ref 3.5–5.1)
Sodium: 137 meq/L (ref 135–145)

## 2014-06-13 ENCOUNTER — Other Ambulatory Visit: Payer: Self-pay | Admitting: *Deleted

## 2014-06-13 MED ORDER — LISINOPRIL-HYDROCHLOROTHIAZIDE 10-12.5 MG PO TABS: 1.0000 | ORAL_TABLET | Freq: Every day | ORAL | Status: DC

## 2014-06-14 ENCOUNTER — Other Ambulatory Visit: Payer: Self-pay | Admitting: *Deleted

## 2014-06-14 MED ORDER — LISINOPRIL-HYDROCHLOROTHIAZIDE 10-12.5 MG PO TABS: 1.0000 | ORAL_TABLET | Freq: Every day | ORAL | Status: DC

## 2014-06-22 ENCOUNTER — Other Ambulatory Visit: Payer: Self-pay

## 2014-10-09 ENCOUNTER — Other Ambulatory Visit: Payer: Self-pay | Admitting: *Deleted

## 2014-10-09 MED ORDER — FLUOXETINE HCL 20 MG PO TABS: ORAL_TABLET | ORAL | Status: DC

## 2014-12-07 ENCOUNTER — Other Ambulatory Visit: Payer: Self-pay | Admitting: Nurse Practitioner

## 2014-12-10 ENCOUNTER — Other Ambulatory Visit: Payer: Self-pay | Admitting: Nurse Practitioner

## 2014-12-25 ENCOUNTER — Other Ambulatory Visit: Payer: Self-pay | Admitting: Internal Medicine

## 2014-12-25 NOTE — Telephone Encounter (Signed)
Mailed unread message to pt  

## 2015-01-05 ENCOUNTER — Other Ambulatory Visit: Payer: Self-pay | Admitting: Internal Medicine

## 2015-01-06 ENCOUNTER — Other Ambulatory Visit: Payer: Self-pay | Admitting: Nurse Practitioner

## 2015-01-07 NOTE — Telephone Encounter (Signed)
Left message on pts Vm to return call to schedule appoint.  Pt has not called yet.  Last OV was 7.20.15 with Raquel.  Please advise refill

## 2015-01-07 NOTE — Telephone Encounter (Signed)
Pt has appoint scheduled for 5.23.16.  Please advise Prozac refill

## 2015-01-07 NOTE — Telephone Encounter (Signed)
Left message on VM to return call to schedule appoint.

## 2015-01-07 NOTE — Telephone Encounter (Signed)
Pt aware Rx ready for pick up 

## 2015-01-07 NOTE — Telephone Encounter (Signed)
Left message, notifying pt need for appt and to please call to schedule.

## 2015-01-07 NOTE — Telephone Encounter (Signed)
PATIENT BELONGS TO [facility] NOW,  30 DAY REFILL GIVEN

## 2015-01-07 NOTE — Telephone Encounter (Signed)
Pt scheduled appoint for 5.23.16.

## 2015-01-28 ENCOUNTER — Ambulatory Visit: Payer: Self-pay | Admitting: Nurse Practitioner

## 2015-02-06 ENCOUNTER — Other Ambulatory Visit: Payer: Self-pay | Admitting: Internal Medicine

## 2015-02-06 ENCOUNTER — Other Ambulatory Visit: Payer: Self-pay | Admitting: Nurse Practitioner

## 2015-02-06 NOTE — Telephone Encounter (Signed)
Last OV 5.23.16, no future OV scheduled. Please advise

## 2015-02-06 NOTE — Telephone Encounter (Signed)
Last appt 03/26/14 with Judith Drake. Pt had been contacted and scheduled appt last month for refills, then canceled appt. Refill?

## 2015-03-09 ENCOUNTER — Other Ambulatory Visit: Payer: Self-pay | Admitting: Nurse Practitioner

## 2015-03-12 NOTE — Telephone Encounter (Signed)
Last OV 3.20.15 with Raquel.  Cancelled 5.23.16 appoint.  Please advise refill

## 2015-03-12 NOTE — Telephone Encounter (Signed)
Left detailed message on VM requesting return call to schedule appoint

## 2015-03-21 ENCOUNTER — Emergency Department (HOSPITAL_COMMUNITY)
Admission: EM | Admit: 2015-03-21 | Discharge: 2015-03-21 | Disposition: A | Discharge status: Home / Self Care | Payer: 59 | Attending: Emergency Medicine | Admitting: Emergency Medicine

## 2015-03-21 ENCOUNTER — Emergency Department (HOSPITAL_COMMUNITY): Payer: 59

## 2015-03-21 ENCOUNTER — Encounter (HOSPITAL_COMMUNITY): Payer: Self-pay | Admitting: Neurology

## 2015-03-21 DIAGNOSIS — Z720 Tobacco use: Secondary | ICD-10-CM | POA: Diagnosis not present

## 2015-03-21 DIAGNOSIS — Z880 Allergy status to penicillin: Secondary | ICD-10-CM | POA: Insufficient documentation

## 2015-03-21 DIAGNOSIS — S99922A Unspecified injury of left foot, initial encounter: Secondary | ICD-10-CM | POA: Diagnosis present

## 2015-03-21 DIAGNOSIS — S99912A Unspecified injury of left ankle, initial encounter: Secondary | ICD-10-CM | POA: Insufficient documentation

## 2015-03-21 DIAGNOSIS — X58XXXA Exposure to other specified factors, initial encounter: Secondary | ICD-10-CM | POA: Diagnosis not present

## 2015-03-21 DIAGNOSIS — Z8719 Personal history of other diseases of the digestive system: Secondary | ICD-10-CM | POA: Diagnosis not present

## 2015-03-21 DIAGNOSIS — Z79899 Other long term (current) drug therapy: Secondary | ICD-10-CM | POA: Diagnosis not present

## 2015-03-21 DIAGNOSIS — S93602A Unspecified sprain of left foot, initial encounter: Secondary | ICD-10-CM | POA: Diagnosis not present

## 2015-03-21 DIAGNOSIS — Y9389 Activity, other specified: Secondary | ICD-10-CM | POA: Insufficient documentation

## 2015-03-21 DIAGNOSIS — Y998 Other external cause status: Secondary | ICD-10-CM | POA: Insufficient documentation

## 2015-03-21 DIAGNOSIS — I10 Essential (primary) hypertension: Secondary | ICD-10-CM | POA: Diagnosis not present

## 2015-03-21 DIAGNOSIS — Y9289 Other specified places as the place of occurrence of the external cause: Secondary | ICD-10-CM | POA: Insufficient documentation

## 2015-03-21 IMAGING — CR DG FOOT COMPLETE 3+V*L*
3 series · 3 of 3 positions shown · non-contrast
Comparison: [DATE].

CLINICAL DATA: Acute left foot pain after fall down stairs. Initial
encounter.

EXAM:
LEFT FOOT - COMPLETE 3+ VIEW

[foot ap]
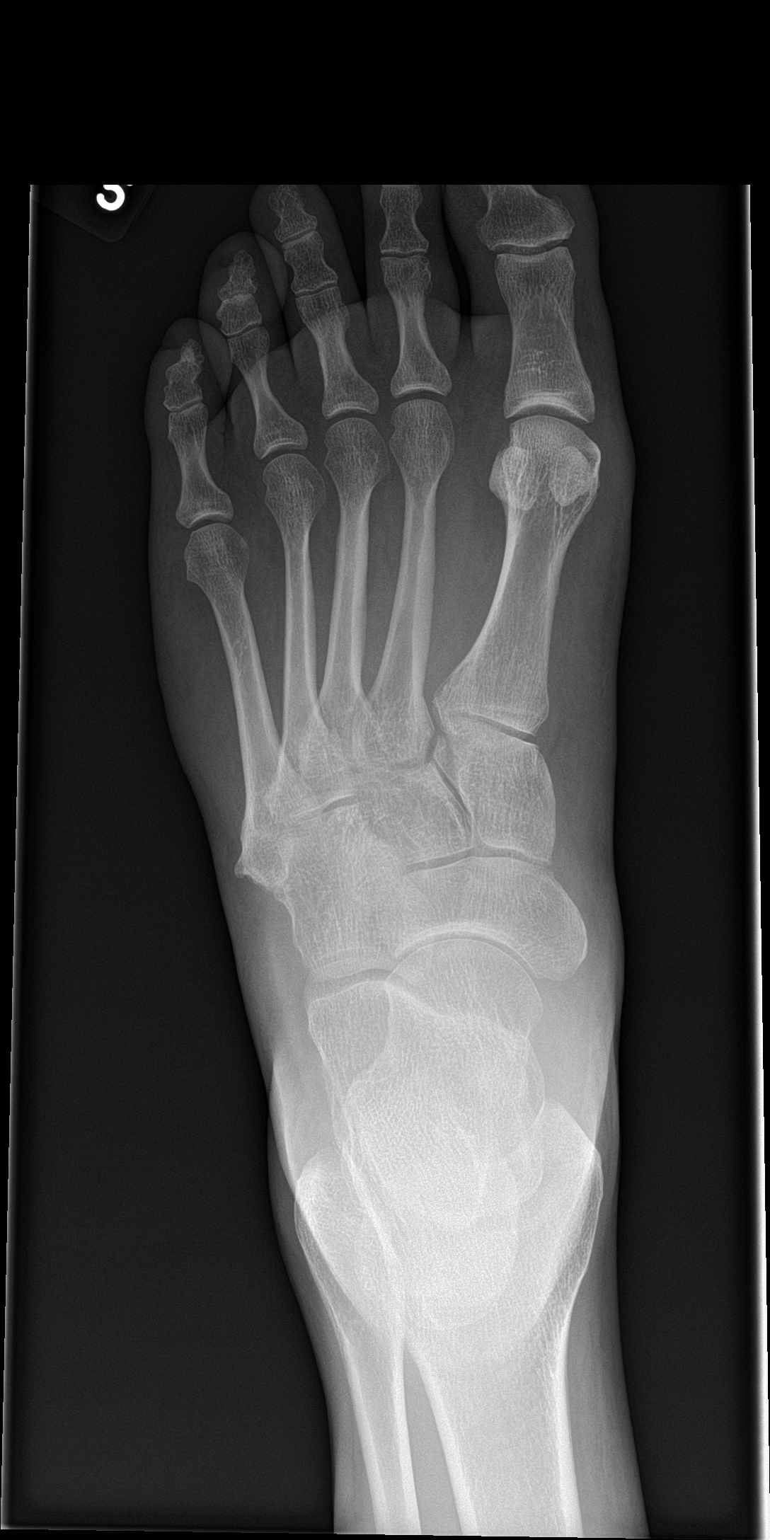

[foot obl]
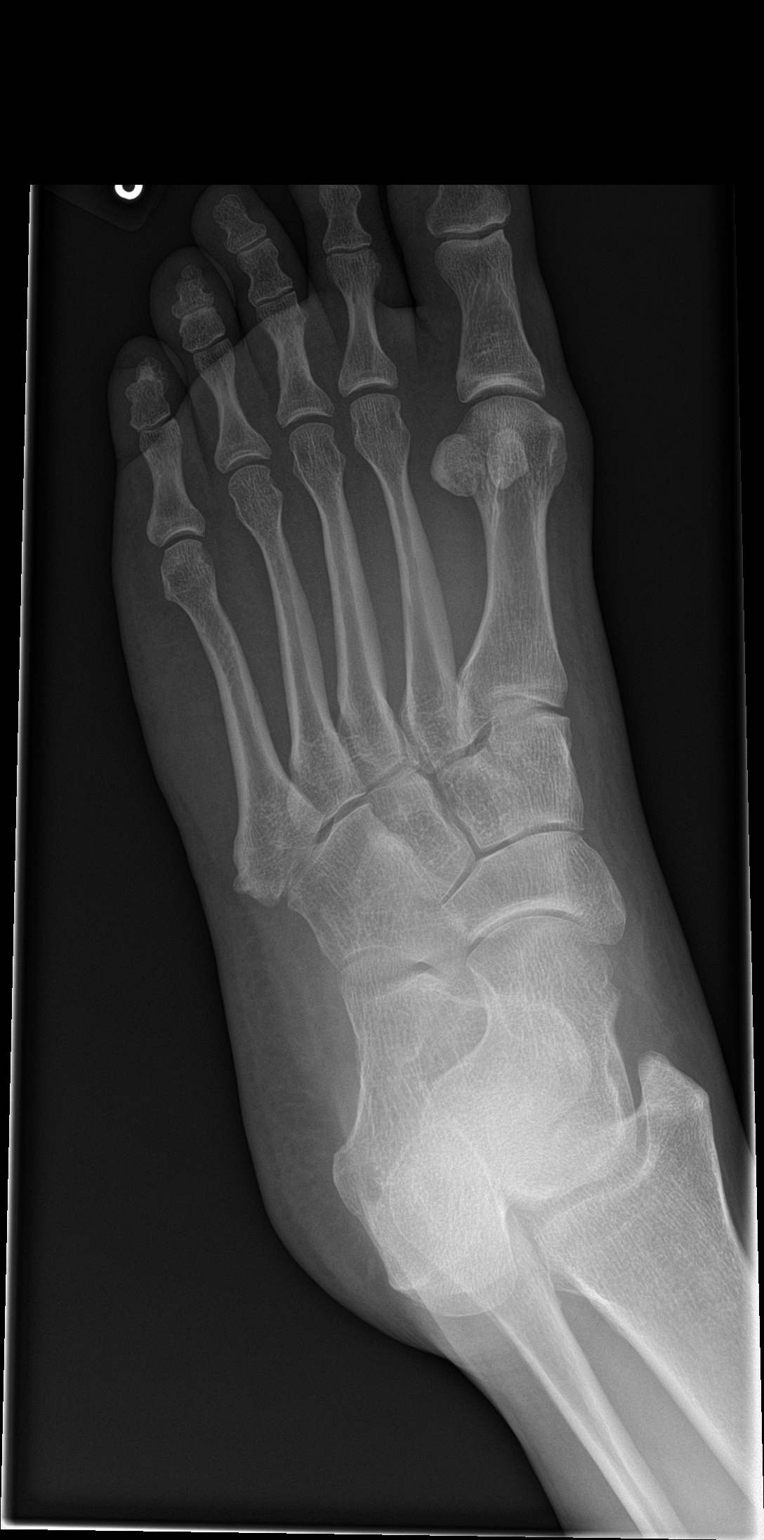

[foot lat]
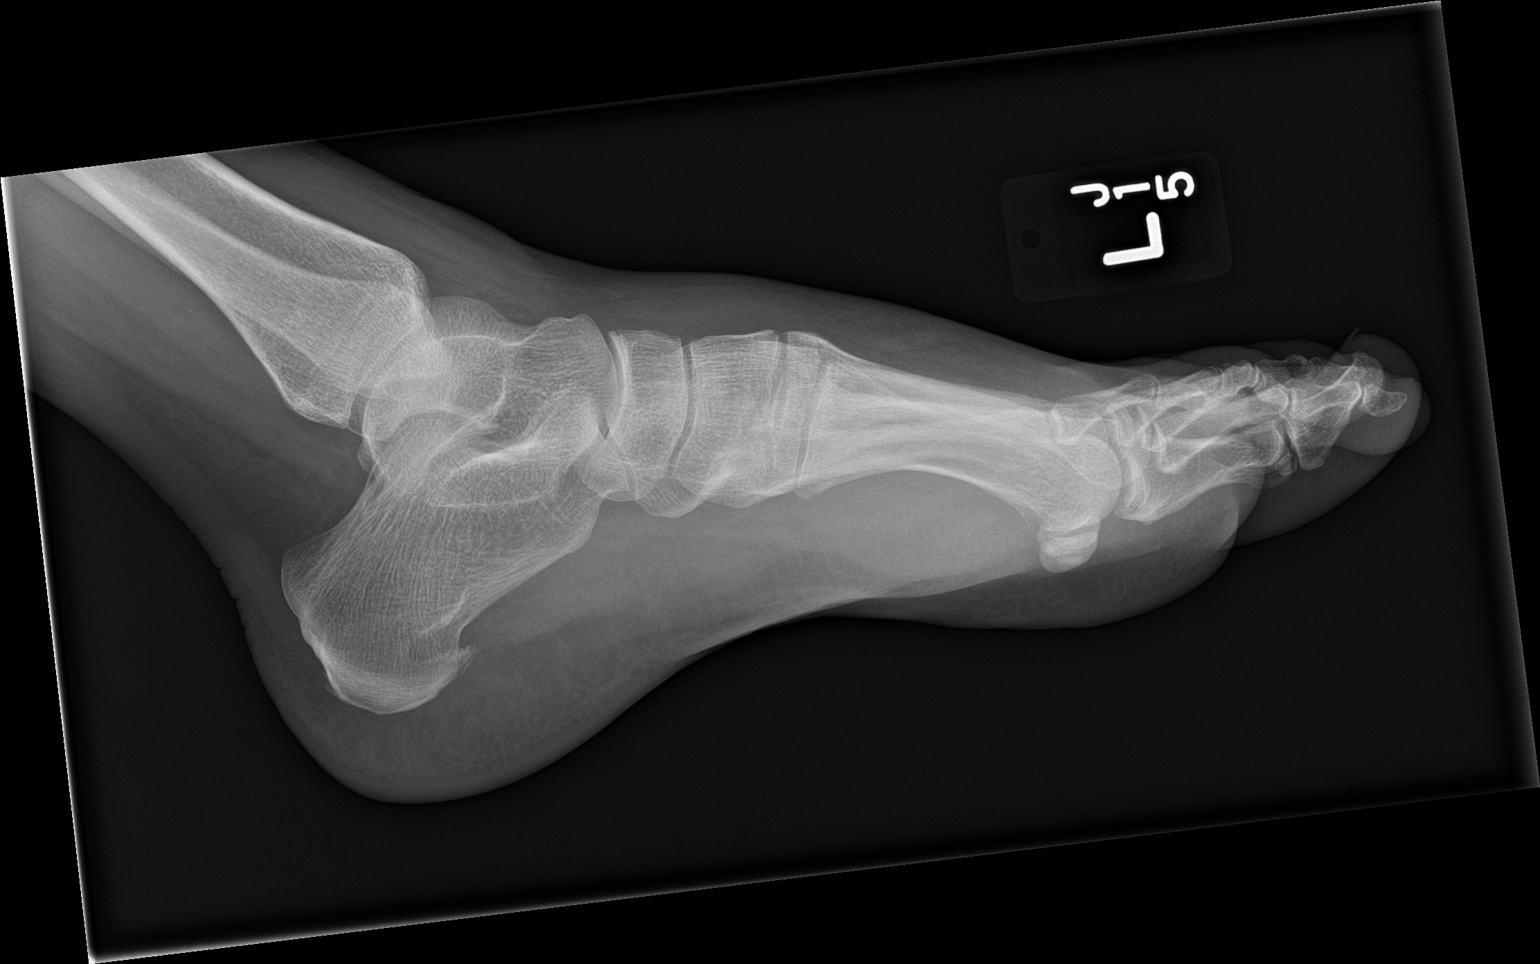

[3 of 3 positions shown; findings below may reference images not displayed]

FINDINGS: There is no evidence of fracture or dislocation. Joint spaces are
intact. Minimal spurring of posterior calcaneus is noted. Soft
tissues are unremarkable.
IMPRESSION: No acute abnormality seen in the left foot.

## 2015-03-21 MED ORDER — NAPROXEN 250 MG PO TABS
500.0000 mg | ORAL_TABLET | Freq: Once | ORAL | Status: AC
  Administered 2015-03-21: 500 mg via ORAL
  Filled 2015-03-21: qty 2

## 2015-03-21 MED ORDER — HYDROCODONE-ACETAMINOPHEN 5-325 MG PO TABS: 1.0000 | ORAL_TABLET | ORAL | Status: DC | PRN

## 2015-03-21 MED ORDER — NAPROXEN 500 MG PO TABS: 500.0000 mg | ORAL_TABLET | Freq: Two times a day (BID) | ORAL | Status: DC

## 2015-03-21 MED ORDER — HYDROCODONE-ACETAMINOPHEN 5-325 MG PO TABS
1.0000 | ORAL_TABLET | Freq: Once | ORAL | Status: AC
  Administered 2015-03-21: 1 via ORAL
  Filled 2015-03-21: qty 1

## 2015-03-21 NOTE — ED Notes (Signed)
Pt reports last night tripped going to down patio stairs and twisted her left ankle. C/o left ankle/foot pain. Swelling noted.

## 2015-03-21 NOTE — Discharge Instructions (Signed)
Foot Sprain The muscles and cord like structures which attach muscle to bone (tendons) that surround the feet are made up of units. A foot sprain can occur at the weakest spot in any of these units. This condition is most often caused by injury to or overuse of the foot, as from playing contact sports, or aggravating a previous injury, or from poor conditioning, or obesity. SYMPTOMS  Pain with movement of the foot.  Tenderness and swelling at the injury site.  Loss of strength is present in moderate or severe sprains. THE THREE GRADES OR SEVERITY OF FOOT SPRAIN ARE:  Mild (Grade I): Slightly pulled muscle without tearing of muscle or tendon fibers or loss of strength.  Moderate (Grade II): Tearing of fibers in a muscle, tendon, or at the attachment to bone, with small decrease in strength.  Severe (Grade III): Rupture of the muscle-tendon-bone attachment, with separation of fibers. Severe sprain requires surgical repair. Often repeating (chronic) sprains are caused by overuse. Sudden (acute) sprains are caused by direct injury or over-use. DIAGNOSIS  Diagnosis of this condition is usually by your own observation. If problems continue, a caregiver may be required for further evaluation and treatment. X-rays may be required to make sure there are not breaks in the bones (fractures) present. Continued problems may require physical therapy for treatment. PREVENTION  Use strength and conditioning exercises appropriate for your sport.  Warm up properly prior to working out.  Use athletic shoes that are made for the sport you are participating in.  Allow adequate time for healing. Early return to activities makes repeat injury more likely, and can lead to an unstable arthritic foot that can result in prolonged disability. Mild sprains generally heal in 3 to 10 days, with moderate and severe sprains taking 2 to 10 weeks. Your caregiver can help you determine the proper time required for  healing. HOME CARE INSTRUCTIONS   Apply ice to the injury for 15-20 minutes, 03-04 times per day. Put the ice in a plastic bag and place a towel between the bag of ice and your skin.  An elastic wrap (like an Ace bandage) may be used to keep swelling down.  Keep foot above the level of the heart, or at least raised on a footstool, when swelling and pain are present.  Try to avoid use other than gentle range of motion while the foot is painful. Do not resume use until instructed by your caregiver. Then begin use gradually, not increasing use to the point of pain. If pain does develop, decrease use and continue the above measures, gradually increasing activities that do not cause discomfort, until you gradually achieve normal use.  Use crutches if and as instructed, and for the length of time instructed.  Keep injured foot and ankle wrapped between treatments.  Massage foot and ankle for comfort and to keep swelling down. Massage from the toes up towards the knee.  Only take over-the-counter or prescription medicines for pain, discomfort, or fever as directed by your caregiver. SEEK IMMEDIATE MEDICAL CARE IF:   Your pain and swelling increase, or pain is not controlled with medications.  You have loss of feeling in your foot or your foot turns cold or blue.  You develop new, unexplained symptoms, or an increase of the symptoms that brought you to your caregiver. MAKE SURE YOU:   Understand these instructions.  Will watch your condition.  Will get help right away if you are not doing well or get worse. Document Released:  02/13/2002 Document Revised: 11/16/2011 Document Reviewed: 04/12/2008 Exit[employer] Patient Information 2015 [place], [place]. This information is not intended to replace advice given to you by your health care provider. Make sure you discuss any questions you have with your health care provider.   Wear the ASO and use crutches to avoid weight bearing.  Use ice and  elevation as much as possible for the next several days to help reduce the swelling.  Take the medications prescribed.  You may take the hydrocodone prescribed for pain relief.  This will make you drowsy - do not drive within 4 hours of taking this medication.  Use the naproxen also for inflammation.  Call your doctor for a recheck of your injury in 1 week.  You may benefit from physical therapy of your foot if it is not getting better over the next week.

## 2015-03-21 NOTE — ED Provider Notes (Signed)
CSN: [id number]     Arrival date & time 03/21/15  1221 History  This chart was scribed for non-physician practitioner, Evalee Jefferson, PA-C, working with Virgel Manifold, MD, by Stephania Fragmin, ED Scribe. This patient was seen in room TR11C/TR11C and the patient's care was started at 1:28 PM.    Chief Complaint  Patient presents with  . Foot Pain   Patient is a 44 y.o. female presenting with lower extremity pain. The history is provided by the patient. No language interpreter was used.  Foot Pain This is a new problem. The current episode started 12 to 24 hours ago. The problem occurs constantly. The problem has been gradually worsening. Pertinent negatives include no chest pain, no abdominal pain, no headaches and no shortness of breath. The symptoms are aggravated by walking and standing. Nothing relieves the symptoms. She has tried nothing for the symptoms.    HPI Comments: Judith Drake is a 44 y.o. female who presents to the Emergency Department complaining of constant, severe, sharp, shooting, left foot and ankle pain after missing a step while going down the patio steps and twisting her foot in a somewhat backwards direction last night. She complains of associated swelling and states she is unable to bear weight on her foot secondary to the pain. She also complains of trouble sleeping secondary to the pain. She states she has a history of left toe fracture in the past, which explains why one of her toes is constantly crooked. Patient has not taken any medications for this. She has known allergies to amoxicillin. 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  PCP: [employer]  Past Medical History  Diagnosis Date  . Hypertension   . GERD (gastroesophageal reflux disease)    Past Surgical History  Procedure Laterality Date  . Tubal ligation    . Elbow surgery      1997    Family History  Problem Relation Age of Onset  . Cancer Mother 75    Lung   .  Hyperlipidemia Mother   . Hypertension Mother   . Stroke Mother   . Kidney disease Mother   . Diabetes Mother   . Heart disease Mother   . Hyperlipidemia Father   . Cancer Paternal Grandfather 70    Colon Cancer - died  . Autism Son    History  Substance Use Topics  . Smoking status: Light Tobacco Smoker    Types: Cigarettes  . Smokeless tobacco: Not on file  . Alcohol Use: Yes     Comment: 6 drinks daily    OB History    No data available     Review of Systems  Constitutional: Negative for fever.  Respiratory: Negative for shortness of breath.   Cardiovascular: Negative for chest pain.  Gastrointestinal: Negative for abdominal pain.  Musculoskeletal: Positive for joint swelling and arthralgias. Negative for myalgias.  Neurological: Negative for weakness, numbness and headaches.      Allergies  Amoxicillin  Home Medications   Prior to Admission medications   Medication Sig Start Date End Date Taking? Authorizing Provider  FLUoxetine (PROZAC) 20 MG tablet TAKE 1 TABLET BY MOUTH EVERY DAY 02/06/15   Rubbie Battiest, NP  HYDROcodone-acetaminophen (NORCO/VICODIN) 5-325 MG per tablet Take 1 tablet by mouth every 4 (four) hours as needed for moderate pain. 03/21/15   Evalee Jefferson, PA-C  lisinopril-hydrochlorothiazide (PRINZIDE,ZESTORETIC) 10-12.5 MG per tablet TAKE 1 TABLET BY MOUTH DAILY. 02/06/15   Morey Hummingbird  Cheryll Dessert, NP  naproxen (NAPROSYN) 500 MG tablet Take 1 tablet (500 mg total) by mouth 2 (two) times daily. 03/21/15   Evalee Jefferson, PA-C  nystatin cream (MYCOSTATIN) APPLY TOPICALLY 2 (TWO) TImes daily PRN    Raquel M Rey, NP   BP 155/80 mmHg  Pulse 82  Temp(Src) 98.1 F (36.7 C) (Oral)  Resp 16  Ht 5\' 3"  (1.6 m)  Wt 160 lb (72.576 kg)  BMI 28.35 kg/m2  SpO2 97%  LMP 03/08/2015 Physical Exam  Constitutional: She appears well-developed and well-nourished.  HENT:  Head: Atraumatic.  Neck: Normal range of motion.  Cardiovascular:  Pulses equal bilaterally  Musculoskeletal:  She exhibits tenderness.       Left foot: There is swelling. There is no bony tenderness, no crepitus and no deformity.  Moderate swelling and bruising along left lateral dorsal foot. No TTP at the lateral malleolus. Distal sensation is intact. Distal cap refill <2 seconds. No proximal fibular TTP.   Neurological: She is alert. She has normal strength. She displays normal reflexes. No sensory deficit.  Skin: Skin is warm and dry.  Psychiatric: She has a normal mood and affect.  Nursing note and vitals reviewed.   ED Course  Procedures (including critical care time)  DIAGNOSTIC STUDIES: Oxygen Saturation is 96% on RA, normal by my interpretation.    COORDINATION OF CARE: 1:30 PM - Discussed XR results and treatment plan with pt at bedside which includes Rx Naprosyn, few hydrocodone, crutches, compression wrap, nonweightbearing, and RICE protocol. Discussed with patient that she should expect a healing time of 4-6 weeks, advised wearing aso when ambulating until pain completely resolved.  Crutches until swelling improved (suspect 4-7 days).  F/u with pcp in 1 week for recheck of injury. Pt verbalized understanding and agreed to plan.   Imaging Review No results found.  MDM   Final diagnoses:  Foot sprain, left, initial encounter    I personally performed the services described in this documentation, which was scribed in my presence. The recorded information has been reviewed and is accurate.   Evalee Jefferson, PA-C 03/23/15 1812  Virgel Manifold, MD 03/23/15 2106

## 2015-07-23 ENCOUNTER — Ambulatory Visit: Payer: 59 | Admitting: Nurse Practitioner

## 2015-07-25 ENCOUNTER — Inpatient Hospital Stay
Admission: RE | Admit: 2015-07-25 | Discharge: 2015-07-25 | Disposition: A | Discharge status: Home / Self Care | Payer: Self-pay | Source: Ambulatory Visit | Attending: *Deleted | Admitting: *Deleted

## 2015-07-25 ENCOUNTER — Other Ambulatory Visit: Payer: Self-pay | Admitting: Unknown Physician Specialty

## 2015-07-25 ENCOUNTER — Other Ambulatory Visit: Payer: Self-pay | Admitting: *Deleted

## 2015-07-25 DIAGNOSIS — R928 Other abnormal and inconclusive findings on diagnostic imaging of breast: Secondary | ICD-10-CM

## 2015-07-25 DIAGNOSIS — Z9289 Personal history of other medical treatment: Secondary | ICD-10-CM

## 2015-08-12 ENCOUNTER — Ambulatory Visit
Admission: RE | Admit: 2015-08-12 | Discharge: 2015-08-12 | Disposition: A | Discharge status: Home / Self Care | Payer: Self-pay | Source: Ambulatory Visit | Attending: Unknown Physician Specialty | Admitting: Unknown Physician Specialty

## 2015-08-12 DIAGNOSIS — R928 Other abnormal and inconclusive findings on diagnostic imaging of breast: Secondary | ICD-10-CM

## 2015-08-21 IMAGING — CR DG ANKLE COMPLETE 3+V*L*
3 series · 3 of 3 positions shown · non-contrast
Comparison: None.

CLINICAL DATA: Fall down stairs with twisting injury to the ankle
with swelling and pain, initial encounter

EXAM:
LEFT ANKLE COMPLETE - 3+ VIEW

[ankle ap]
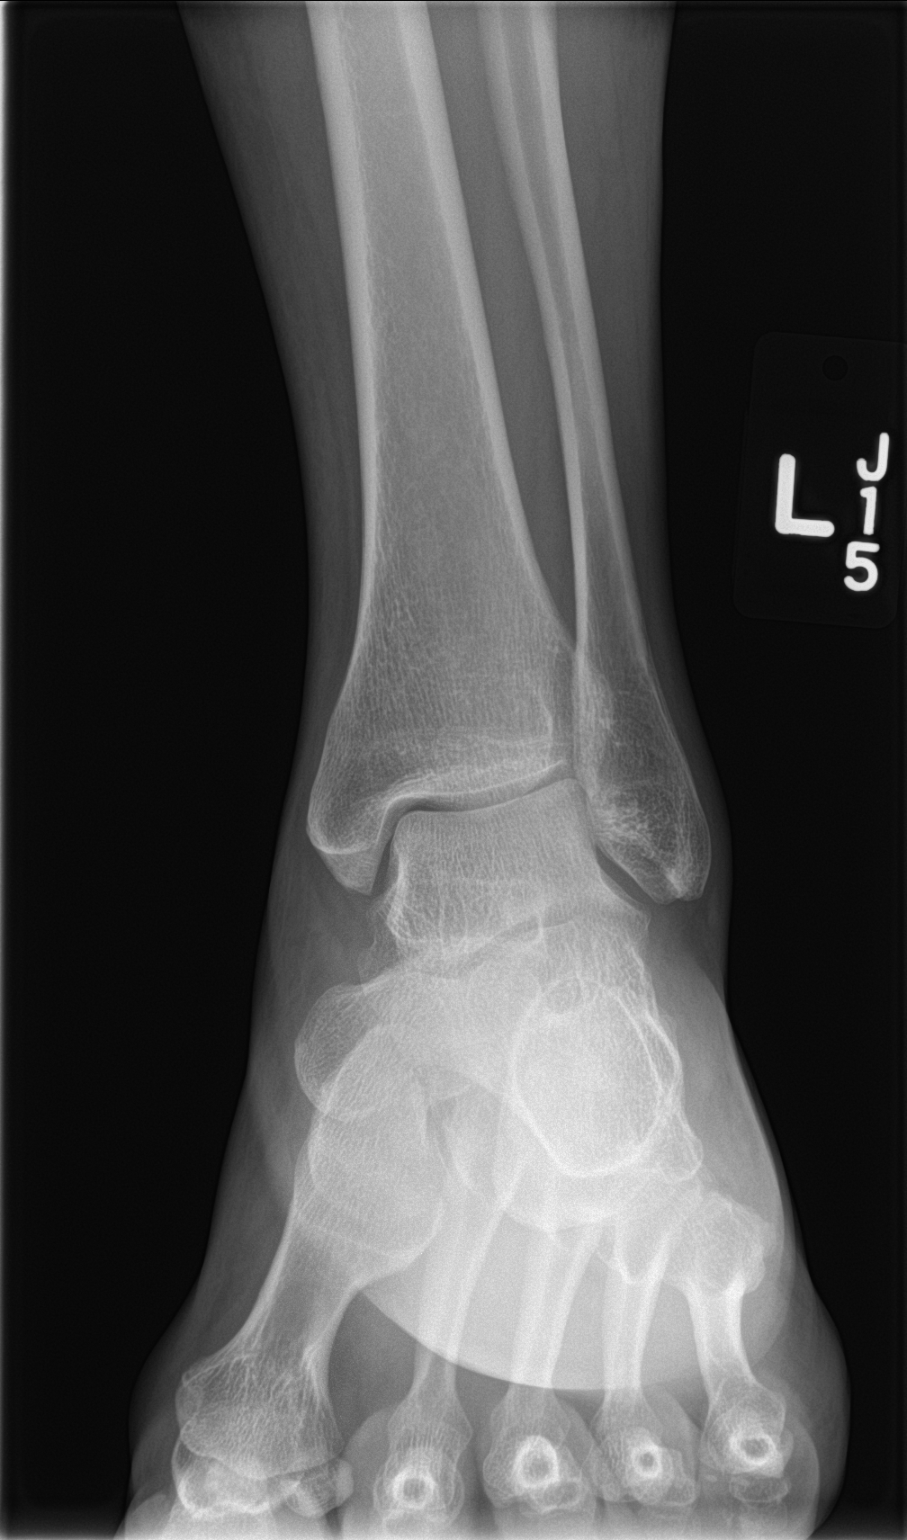

[ankle obl]
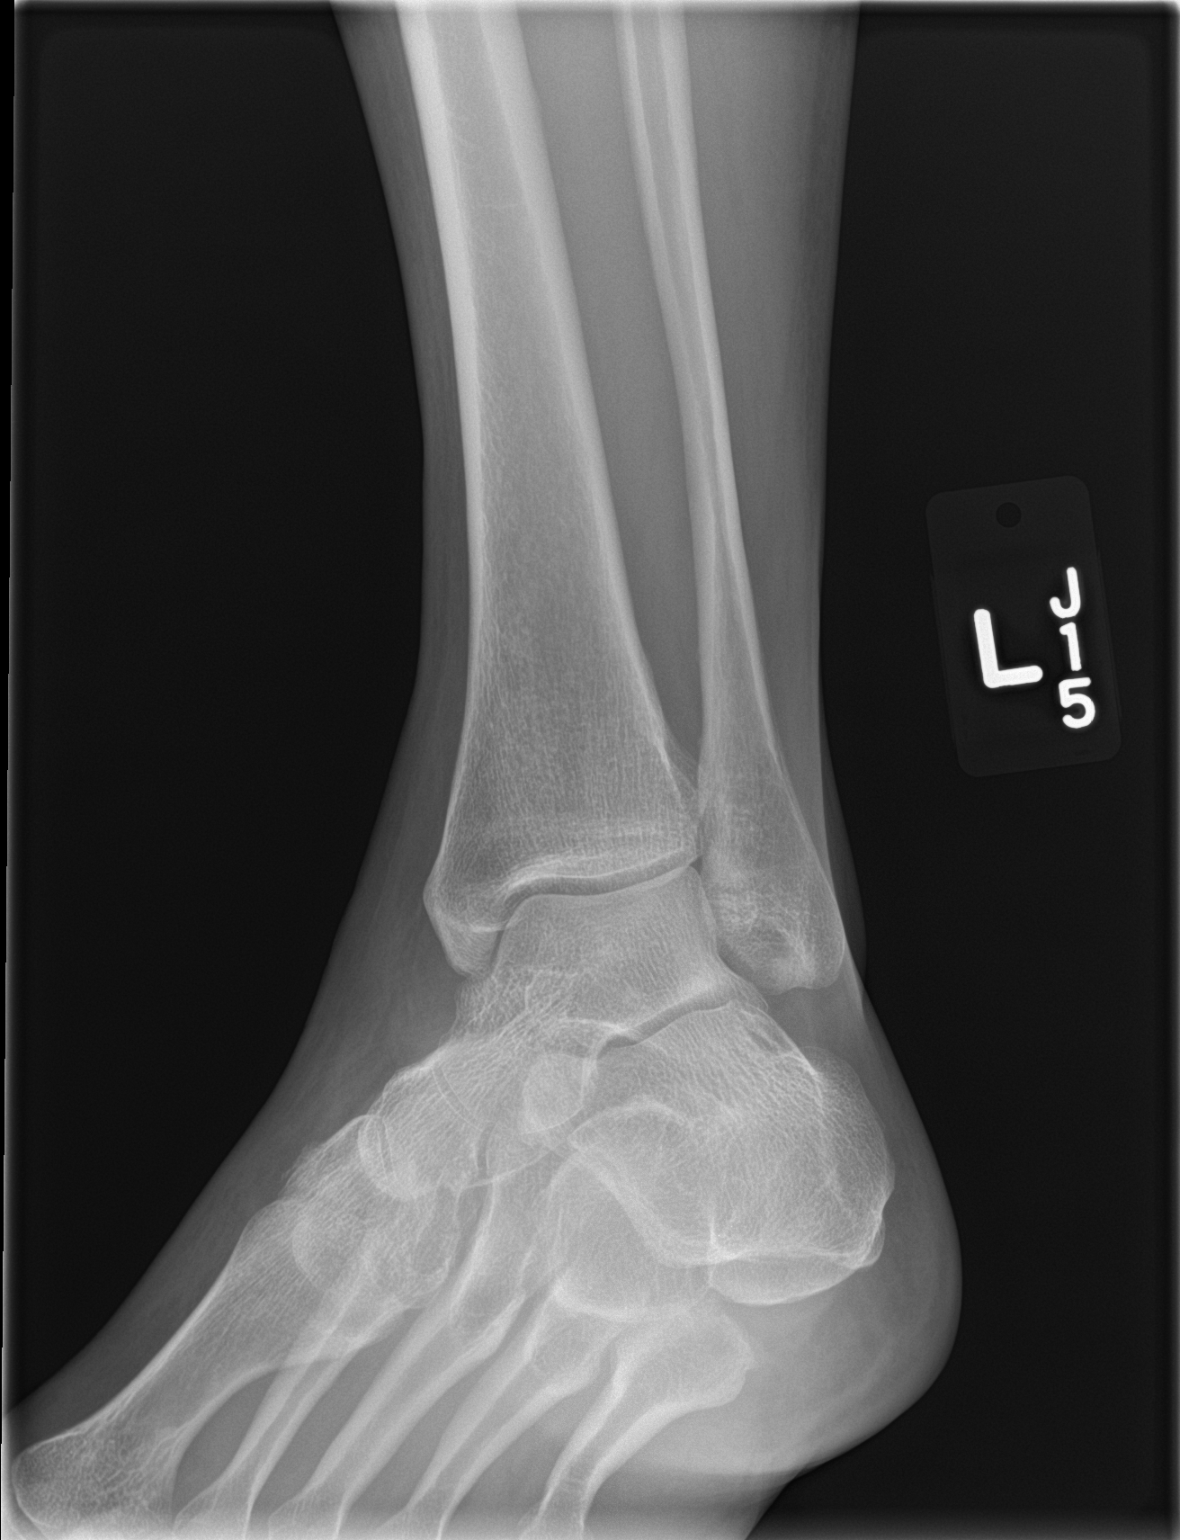

[ankle lat]
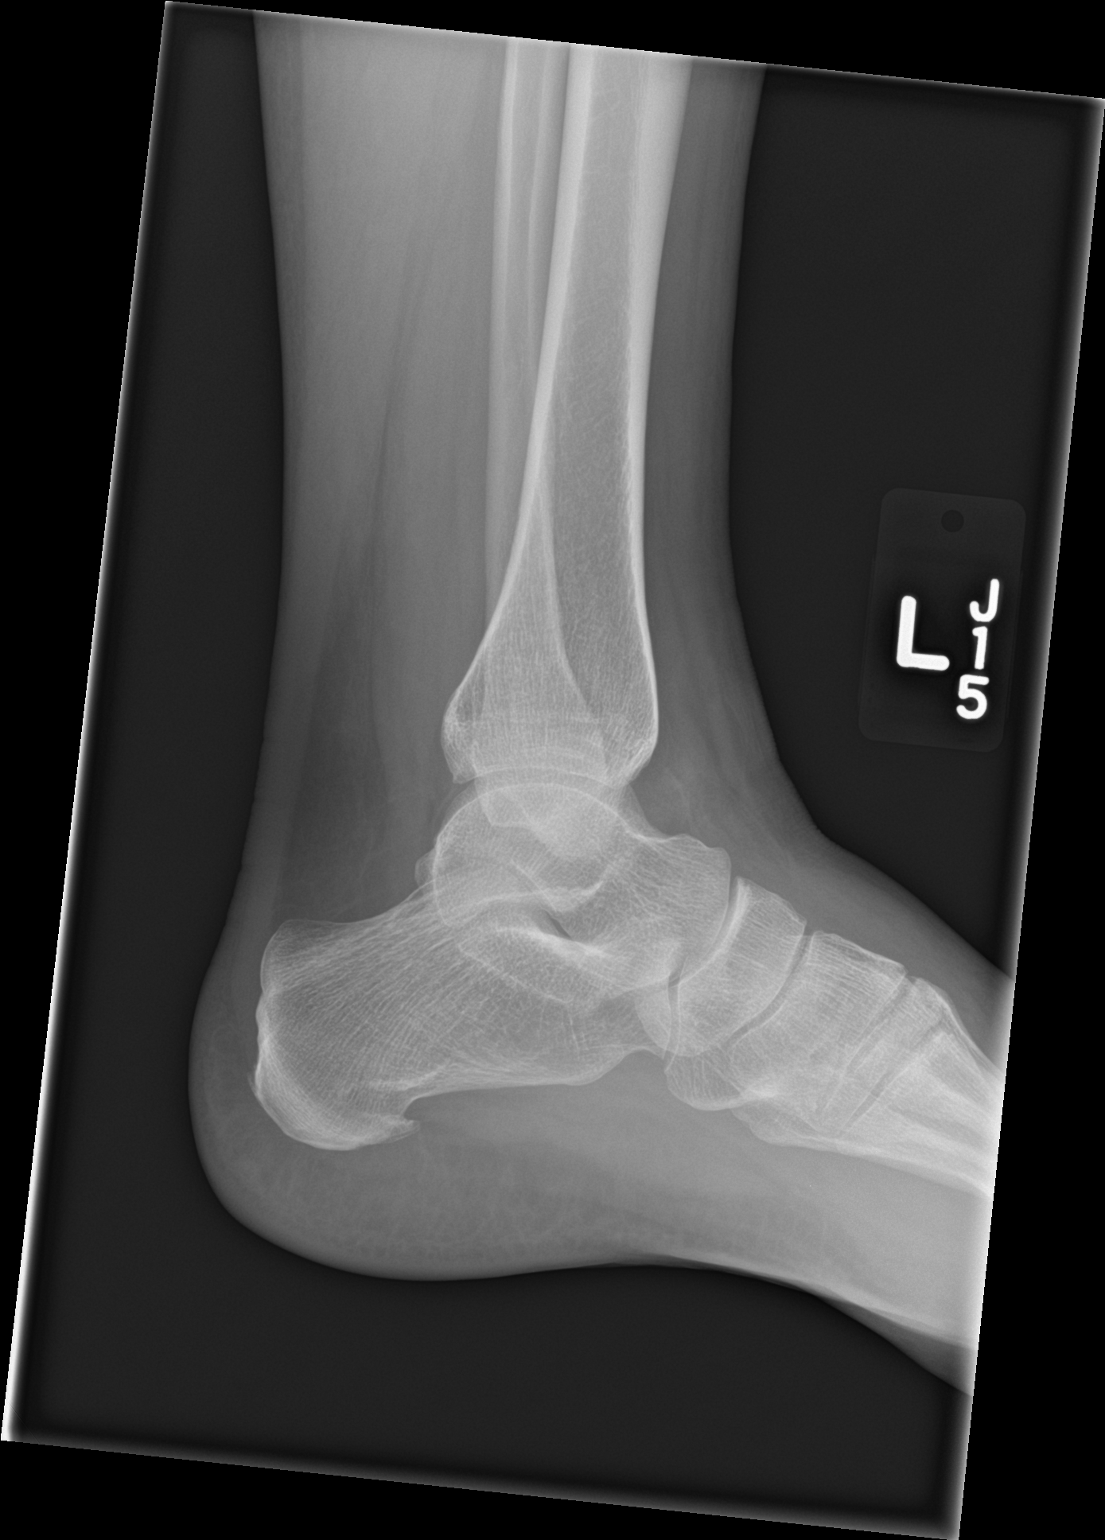

[3 of 3 positions shown; findings below may reference images not displayed]

FINDINGS: There is no evidence of fracture, dislocation, or joint effusion.
There is no evidence of arthropathy or other focal bone abnormality.
Soft tissues are unremarkable.
IMPRESSION: No acute abnormality noted.

## 2016-02-25 DIAGNOSIS — J069 Acute upper respiratory infection, unspecified: Secondary | ICD-10-CM | POA: Diagnosis not present

## 2016-07-12 ENCOUNTER — Encounter: Payer: Self-pay | Admitting: Emergency Medicine

## 2016-07-12 ENCOUNTER — Emergency Department
Admission: EM | Admit: 2016-07-12 | Discharge: 2016-07-12 | Disposition: A | Discharge status: Home / Self Care | Payer: BLUE CROSS/BLUE SHIELD | Attending: Emergency Medicine | Admitting: Emergency Medicine

## 2016-07-12 DIAGNOSIS — S0502XA Injury of conjunctiva and corneal abrasion without foreign body, left eye, initial encounter: Secondary | ICD-10-CM

## 2016-07-12 DIAGNOSIS — Y929 Unspecified place or not applicable: Secondary | ICD-10-CM | POA: Insufficient documentation

## 2016-07-12 DIAGNOSIS — Y939 Activity, unspecified: Secondary | ICD-10-CM | POA: Diagnosis not present

## 2016-07-12 DIAGNOSIS — Y999 Unspecified external cause status: Secondary | ICD-10-CM | POA: Insufficient documentation

## 2016-07-12 DIAGNOSIS — H5712 Ocular pain, left eye: Secondary | ICD-10-CM | POA: Diagnosis not present

## 2016-07-12 DIAGNOSIS — F17210 Nicotine dependence, cigarettes, uncomplicated: Secondary | ICD-10-CM | POA: Diagnosis not present

## 2016-07-12 DIAGNOSIS — I10 Essential (primary) hypertension: Secondary | ICD-10-CM | POA: Diagnosis not present

## 2016-07-12 DIAGNOSIS — S0500XA Injury of conjunctiva and corneal abrasion without foreign body, unspecified eye, initial encounter: Secondary | ICD-10-CM | POA: Diagnosis not present

## 2016-07-12 DIAGNOSIS — X58XXXA Exposure to other specified factors, initial encounter: Secondary | ICD-10-CM | POA: Insufficient documentation

## 2016-07-12 HISTORY — DX: Anemia, unspecified: D64.9

## 2016-07-12 MED ORDER — HYDROCHLOROTHIAZIDE 12.5 MG PO CAPS
12.5000 mg | ORAL_CAPSULE | Freq: Every day | ORAL | 0 refills | Status: DC
Start: 2016-07-12 — End: 2017-12-13

## 2016-07-12 MED ORDER — TETRACAINE HCL 0.5 % OP SOLN
OPHTHALMIC | Status: AC
  Filled 2016-07-12: qty 2

## 2016-07-12 MED ORDER — TRAMADOL HCL 50 MG PO TABS: 50.0000 mg | ORAL_TABLET | Freq: Four times a day (QID) | ORAL | 0 refills | Status: AC | PRN

## 2016-07-12 MED ORDER — ERYTHROMYCIN 5 MG/GM OP OINT
TOPICAL_OINTMENT | Freq: Four times a day (QID) | OPHTHALMIC | 0 refills | Status: AC
Start: 2016-07-12 — End: 2016-07-22

## 2016-07-12 MED ORDER — FLUORESCEIN SODIUM 1 MG OP STRP
ORAL_STRIP | OPHTHALMIC | Status: AC
  Filled 2016-07-12: qty 1

## 2016-07-12 NOTE — ED Provider Notes (Signed)
[facility] Emergency Department Provider Note   ____________________________________________   I have reviewed the triage vital signs and the nursing notes.   HISTORY  Chief Complaint Eye irritation and Hypertension   History limited by: Not Limited   HPI Judith Drake is a 45 y.o. female who presents to the emergency department today at the request of the provider at urgent care. The patient went to urgent care today because of concern for eye itchiness, pain and tearing. It is the left eye. It started roughly 12 hours ago when she woke up and noticed some itchiness. It has continued to get worse. She describes blurry vision but no focal visual field deficit. She denies any injury to her eye. In addition the urgent care had concern for the patient's blood pressure being elevated. The patient states she has a history of high blood pressure but has not been on medication for a couple of years.    Past Medical History:  Diagnosis Date  . Anemia   . GERD (gastroesophageal reflux disease)   . Hypertension     Patient Active Problem List   Diagnosis Date Noted  . Elevated liver enzymes 11/24/2013  . Depression 11/24/2013  . Fatigue 07/12/2013  . Abnormal laboratory test 07/12/2013  . Anemia 07/12/2013  . HTN (hypertension) 07/12/2013  . ETOH abuse 07/12/2013  . Tobacco abuse counseling 07/12/2013  . Yeast infection of the skin 07/12/2013  . Routine general medical examination at a health care facility 07/12/2013    Past Surgical History:  Procedure Laterality Date  . ELBOW SURGERY     1997   . TUBAL LIGATION      Prior to Admission medications   Medication Sig Start Date End Date Taking? Authorizing Provider  FLUoxetine (PROZAC) 20 MG tablet TAKE 1 TABLET BY MOUTH EVERY DAY 02/06/15   Rubbie Battiest, NP  HYDROcodone-acetaminophen (NORCO/VICODIN) 5-325 MG per tablet Take 1 tablet by mouth every 4 (four) hours as needed for moderate pain.  03/21/15   Evalee Jefferson, PA-C  lisinopril-hydrochlorothiazide (PRINZIDE,ZESTORETIC) 10-12.5 MG per tablet TAKE 1 TABLET BY MOUTH DAILY. 02/06/15   Rubbie Battiest, NP  naproxen (NAPROSYN) 500 MG tablet Take 1 tablet (500 mg total) by mouth 2 (two) times daily. 03/21/15   Evalee Jefferson, PA-C  nystatin cream (MYCOSTATIN) APPLY TOPICALLY 2 (TWO) TImes daily PRN    Raquel Dagoberto Ligas, NP    Allergies Amoxicillin  Family History  Problem Relation Age of Onset  . Cancer Mother 45    Lung   . Hyperlipidemia Mother   . Hypertension Mother   . Stroke Mother   . Kidney disease Mother   . Diabetes Mother   . Heart disease Mother   . Hyperlipidemia Father   . Autism Son   . Cancer Paternal Grandfather 58    Colon Cancer - died  . Breast cancer Neg Hx     Social History Social History  Substance Use Topics  . Smoking status: Light Tobacco Smoker    Types: Cigarettes  . Smokeless tobacco: Not on file  . Alcohol use Yes     Comment: 6 drinks daily     Review of Systems  Constitutional: Negative for fever. Cardiovascular: Negative for chest pain. Respiratory: Negative for shortness of breath. Gastrointestinal: Negative for abdominal pain, vomiting and diarrhea. Genitourinary: Negative for dysuria. Musculoskeletal: Negative for back pain. Skin: Negative for rash. Neurological: Negative for headaches, focal weakness or numbness.  10-point ROS otherwise negative.  ____________________________________________  PHYSICAL EXAM:  VITAL SIGNS: ED Triage Vitals  Enc Vitals Group     BP 07/12/16 1435 (!) 178/106     Pulse Rate 07/12/16 1435 92     Resp 07/12/16 1435 17     Temp 07/12/16 1435 98.4 F (36.9 C)     Temp Source 07/12/16 1435 Oral     SpO2 07/12/16 1435 100 %     Weight 07/12/16 1435 165 lb (74.8 kg)     Height 07/12/16 1435 5\' 3"  (1.6 m)     Head Circumference --      Peak Flow --      Pain Score 07/12/16 1445 4   Constitutional: Alert and oriented. Well appearing and in no  distress. Eyes: PERRL. EOMI. Right eye without any conjunctival injection, no corneal lesions noted on wood's lamp exam. Left eye with mild conjunctival injection. On wood's lamp exam patient does have corneal abrasion to the right lower aspect of the cornea.  ENT   Head: Normocephalic and atraumatic.   Nose: No congestion/rhinnorhea.   Mouth/Throat: Mucous membranes are moist.   Neck: No stridor. Hematological/Lymphatic/Immunilogical: No cervical lymphadenopathy. Cardiovascular: Normal rate, regular rhythm.  No murmurs, rubs, or gallops.  Respiratory: Normal respiratory effort without tachypnea nor retractions. Breath sounds are clear and equal bilaterally. No wheezes/rales/rhonchi. Musculoskeletal: Normal range of motion in all extremities.  Neurologic:  Normal speech and language. No gross focal neurologic deficits are appreciated.  Skin:  Skin is warm, dry and intact. No rash noted. Psychiatric: Mood and affect are normal. Speech and behavior are normal. Patient exhibits appropriate insight and judgment.  ____________________________________________    LABS (pertinent positives/negatives)  None  ____________________________________________   EKG  None  ____________________________________________    RADIOLOGY  None  ____________________________________________   PROCEDURES  Procedures  ____________________________________________   INITIAL IMPRESSION / ASSESSMENT AND PLAN / ED COURSE  Pertinent labs & imaging results that were available during my care of the patient were reviewed by me and considered in my medical decision making (see chart for details).  Patient with left sided corneal abrasion. Will give pain medication and erythromycin ointment, will have patient follow up with ophthalmology. In addition the patient has hypertension. History of the same. Will restart HCTZ, have patient follow up with  PCP.  ____________________________________________   FINAL CLINICAL IMPRESSION(S) / ED DIAGNOSES  Final diagnoses:  Hypertension, unspecified type  Abrasion of left cornea, initial encounter     Note: This dictation was prepared with Dragon dictation. Any transcriptional errors that result from this process are unintentional    Nance Pear, MD 07/12/16 [phone]

## 2016-07-12 NOTE — Discharge Instructions (Signed)
Please seek medical attention for any high fevers, chest pain, shortness of breath, change in behavior, persistent vomiting, bloody stool or any other new or concerning symptoms.  

## 2016-07-12 NOTE — ED Triage Notes (Addendum)
Pt presents with left eye irritation, itching and redness. Pt denies drainage but has noted tearing. Pt also reports elevated blood pressure. Pt states she is supposed to be taking medication for hypertension but has not been to the doctor in three years. Pt reports intermittent slight headache. Pt reports feeling tired a lot.

## 2017-07-09 DIAGNOSIS — J019 Acute sinusitis, unspecified: Secondary | ICD-10-CM | POA: Diagnosis not present

## 2017-12-12 ENCOUNTER — Encounter (HOSPITAL_COMMUNITY): Payer: Self-pay | Admitting: Emergency Medicine

## 2017-12-12 ENCOUNTER — Observation Stay (HOSPITAL_COMMUNITY)
Admission: EM | Admit: 2017-12-12 | Discharge: 2017-12-13 | Disposition: A | Discharge status: Home / Self Care | Payer: BLUE CROSS/BLUE SHIELD | Attending: Internal Medicine | Admitting: Internal Medicine

## 2017-12-12 DIAGNOSIS — F419 Anxiety disorder, unspecified: Secondary | ICD-10-CM | POA: Diagnosis not present

## 2017-12-12 DIAGNOSIS — Z79899 Other long term (current) drug therapy: Secondary | ICD-10-CM | POA: Insufficient documentation

## 2017-12-12 DIAGNOSIS — F17210 Nicotine dependence, cigarettes, uncomplicated: Secondary | ICD-10-CM | POA: Diagnosis not present

## 2017-12-12 DIAGNOSIS — Z880 Allergy status to penicillin: Secondary | ICD-10-CM | POA: Insufficient documentation

## 2017-12-12 DIAGNOSIS — E876 Hypokalemia: Secondary | ICD-10-CM | POA: Diagnosis not present

## 2017-12-12 DIAGNOSIS — D649 Anemia, unspecified: Principal | ICD-10-CM | POA: Diagnosis present

## 2017-12-12 DIAGNOSIS — F10129 Alcohol abuse with intoxication, unspecified: Secondary | ICD-10-CM | POA: Insufficient documentation

## 2017-12-12 DIAGNOSIS — N920 Excessive and frequent menstruation with regular cycle: Secondary | ICD-10-CM | POA: Diagnosis present

## 2017-12-12 DIAGNOSIS — F329 Major depressive disorder, single episode, unspecified: Secondary | ICD-10-CM | POA: Insufficient documentation

## 2017-12-12 DIAGNOSIS — D259 Leiomyoma of uterus, unspecified: Secondary | ICD-10-CM | POA: Diagnosis not present

## 2017-12-12 DIAGNOSIS — K219 Gastro-esophageal reflux disease without esophagitis: Secondary | ICD-10-CM | POA: Diagnosis present

## 2017-12-12 DIAGNOSIS — I10 Essential (primary) hypertension: Secondary | ICD-10-CM | POA: Diagnosis present

## 2017-12-12 DIAGNOSIS — Z8249 Family history of ischemic heart disease and other diseases of the circulatory system: Secondary | ICD-10-CM | POA: Diagnosis not present

## 2017-12-12 DIAGNOSIS — F32A Depression, unspecified: Secondary | ICD-10-CM | POA: Diagnosis present

## 2017-12-12 DIAGNOSIS — Z720 Tobacco use: Secondary | ICD-10-CM | POA: Diagnosis present

## 2017-12-12 DIAGNOSIS — F1010 Alcohol abuse, uncomplicated: Secondary | ICD-10-CM | POA: Diagnosis not present

## 2017-12-12 DIAGNOSIS — R0602 Shortness of breath: Secondary | ICD-10-CM | POA: Diagnosis not present

## 2017-12-12 DIAGNOSIS — R450 Nervousness: Secondary | ICD-10-CM | POA: Diagnosis not present

## 2017-12-12 DIAGNOSIS — F1090 Alcohol use, unspecified, uncomplicated: Secondary | ICD-10-CM | POA: Diagnosis present

## 2017-12-12 DIAGNOSIS — R000 Tachycardia, unspecified: Secondary | ICD-10-CM | POA: Diagnosis not present

## 2017-12-12 DIAGNOSIS — R531 Weakness: Secondary | ICD-10-CM | POA: Insufficient documentation

## 2017-12-12 HISTORY — DX: Benign neoplasm of connective and other soft tissue, unspecified: D21.9

## 2017-12-12 HISTORY — DX: Excessive and frequent menstruation with regular cycle: N92.0

## 2017-12-12 LAB — COMPREHENSIVE METABOLIC PANEL
ALK PHOS: 63 U/L (ref 38–126)
ALT: 26 U/L (ref 14–54)
AST: 56 U/L — ABNORMAL HIGH (ref 15–41)
Albumin: 4.3 g/dL (ref 3.5–5.0)
Anion gap: 15 (ref 5–15)
BILIRUBIN TOTAL: 0.7 mg/dL (ref 0.3–1.2)
BUN: <5 mg/dL — ABNORMAL LOW (ref 6–20)
CALCIUM: 8.9 mg/dL (ref 8.9–10.3)
CO2: 22 mmol/L (ref 22–32)
CREATININE: 0.63 mg/dL (ref 0.44–1.00)
Chloride: 98 mmol/L — ABNORMAL LOW (ref 101–111)
GFR calc non Af Amer: >60 mL/min (ref >60)
Glucose, Bld: 92 mg/dL (ref 65–99)
Potassium: 3 mmol/L — ABNORMAL LOW (ref 3.5–5.1)
Sodium: 135 mmol/L (ref 135–145)
Total Protein: 8.1 g/dL (ref 6.5–8.1)

## 2017-12-12 LAB — ETHANOL: ALCOHOL ETHYL (B): 339 mg/dL — AB (ref <10)

## 2017-12-12 LAB — RAPID URINE DRUG SCREEN, HOSP PERFORMED
Amphetamines: NOT DETECTED
Barbiturates: NOT DETECTED
Benzodiazepines: NOT DETECTED
COCAINE: NOT DETECTED
Opiates: NOT DETECTED
Tetrahydrocannabinol: NOT DETECTED

## 2017-12-12 LAB — CBC
HCT: 21.5 % — ABNORMAL LOW (ref 36.0–46.0)
Hemoglobin: 5.9 g/dL — CL (ref 12.0–15.0)
MCH: 17.2 pg — ABNORMAL LOW (ref 26.0–34.0)
MCHC: 27.4 g/dL — ABNORMAL LOW (ref 30.0–36.0)
MCV: 62.5 fL — AB (ref 78.0–100.0)
PLATELETS: 417 10*3/uL — AB (ref 150–400)
RBC: 3.44 MIL/uL — ABNORMAL LOW (ref 3.87–5.11)
RDW: 18.4 % — AB (ref 11.5–15.5)
WBC: 7.5 10*3/uL (ref 4.0–10.5)

## 2017-12-12 LAB — I-STAT BETA HCG BLOOD, ED (MC, WL, AP ONLY): HCG, QUANTITATIVE: 57.6 m[IU]/mL — AB (ref <5)

## 2017-12-12 NOTE — ED Triage Notes (Addendum)
Per EMS, pt was experiencing trouble breathing.  Pt is very upset in triage, yelling demanding water.  Pt has had etoh tonight.  Big complaint is that she has "no energy to take care of anybody else.Marland KitchenMarland KitchenMarland KitchenI am anemic."  Pt admits that she has not been taking her medications.

## 2017-12-13 ENCOUNTER — Encounter (HOSPITAL_COMMUNITY): Payer: Self-pay | Admitting: Internal Medicine

## 2017-12-13 ENCOUNTER — Emergency Department (HOSPITAL_COMMUNITY): Payer: BLUE CROSS/BLUE SHIELD

## 2017-12-13 DIAGNOSIS — D649 Anemia, unspecified: Principal | ICD-10-CM

## 2017-12-13 DIAGNOSIS — D259 Leiomyoma of uterus, unspecified: Secondary | ICD-10-CM | POA: Diagnosis present

## 2017-12-13 DIAGNOSIS — N920 Excessive and frequent menstruation with regular cycle: Secondary | ICD-10-CM

## 2017-12-13 DIAGNOSIS — E876 Hypokalemia: Secondary | ICD-10-CM

## 2017-12-13 DIAGNOSIS — Z720 Tobacco use: Secondary | ICD-10-CM

## 2017-12-13 DIAGNOSIS — I10 Essential (primary) hypertension: Secondary | ICD-10-CM

## 2017-12-13 DIAGNOSIS — K219 Gastro-esophageal reflux disease without esophagitis: Secondary | ICD-10-CM

## 2017-12-13 DIAGNOSIS — N921 Excessive and frequent menstruation with irregular cycle: Secondary | ICD-10-CM

## 2017-12-13 DIAGNOSIS — F1010 Alcohol abuse, uncomplicated: Secondary | ICD-10-CM

## 2017-12-13 HISTORY — DX: Excessive and frequent menstruation with regular cycle: N92.0

## 2017-12-13 LAB — I-STAT TROPONIN, ED: Troponin i, poc: 0 ng/mL (ref 0.00–0.08)

## 2017-12-13 LAB — PREPARE RBC (CROSSMATCH)

## 2017-12-13 LAB — RETICULOCYTES
RBC.: 4.09 MIL/uL (ref 3.87–5.11)
Retic Count, Absolute: 36.8 10*3/uL (ref 19.0–186.0)
Retic Ct Pct: 0.9 % (ref 0.4–3.1)

## 2017-12-13 LAB — IRON AND TIBC
IRON: 479 ug/dL — AB (ref 28–170)
Saturation Ratios: 77 % — ABNORMAL HIGH (ref 10.4–31.8)
TIBC: 622 ug/dL — AB (ref 250–450)
UIBC: 143 ug/dL

## 2017-12-13 LAB — SALICYLATE LEVEL: Salicylate Lvl: <7 mg/dL (ref 2.8–30.0)

## 2017-12-13 LAB — CBC
HEMATOCRIT: 27.3 % — AB (ref 36.0–46.0)
HEMOGLOBIN: 7.9 g/dL — AB (ref 12.0–15.0)
MCH: 19.3 pg — ABNORMAL LOW (ref 26.0–34.0)
MCHC: 28.9 g/dL — ABNORMAL LOW (ref 30.0–36.0)
MCV: 66.7 fL — ABNORMAL LOW (ref 78.0–100.0)
Platelets: 293 10*3/uL (ref 150–400)
RBC: 4.09 MIL/uL (ref 3.87–5.11)
RDW: 19.8 % — ABNORMAL HIGH (ref 11.5–15.5)
WBC: 7.8 10*3/uL (ref 4.0–10.5)

## 2017-12-13 LAB — ABO/RH: ABO/RH(D): A POS

## 2017-12-13 LAB — FERRITIN: FERRITIN: 6 ng/mL — AB (ref 11–307)

## 2017-12-13 LAB — APTT: aPTT: 33 seconds (ref 24–36)

## 2017-12-13 LAB — HCG, QUANTITATIVE, PREGNANCY: hCG, Beta Chain, Quant, S: <1 m[IU]/mL (ref <5)

## 2017-12-13 LAB — PROTIME-INR
INR: 1.17
PROTHROMBIN TIME: 14.9 s (ref 11.4–15.2)

## 2017-12-13 LAB — TSH: TSH: 4.821 u[IU]/mL — AB (ref 0.350–4.500)

## 2017-12-13 LAB — FOLATE: FOLATE: 20.7 ng/mL (ref >5.9)

## 2017-12-13 LAB — ACETAMINOPHEN LEVEL: Acetaminophen (Tylenol), Serum: <10 ug/mL — ABNORMAL LOW (ref 10–30)

## 2017-12-13 LAB — MAGNESIUM: Magnesium: 2.2 mg/dL (ref 1.7–2.4)

## 2017-12-13 LAB — VITAMIN B12: Vitamin B-12: 233 pg/mL (ref 180–914)

## 2017-12-13 IMAGING — CR DG CHEST 2V
2 series · 2 of 2 positions shown · non-contrast
Comparison: [DATE]

CLINICAL DATA: Difficulty breathing.  Shortness of breath.

EXAM:
CHEST - 2 VIEW

[chest pa]
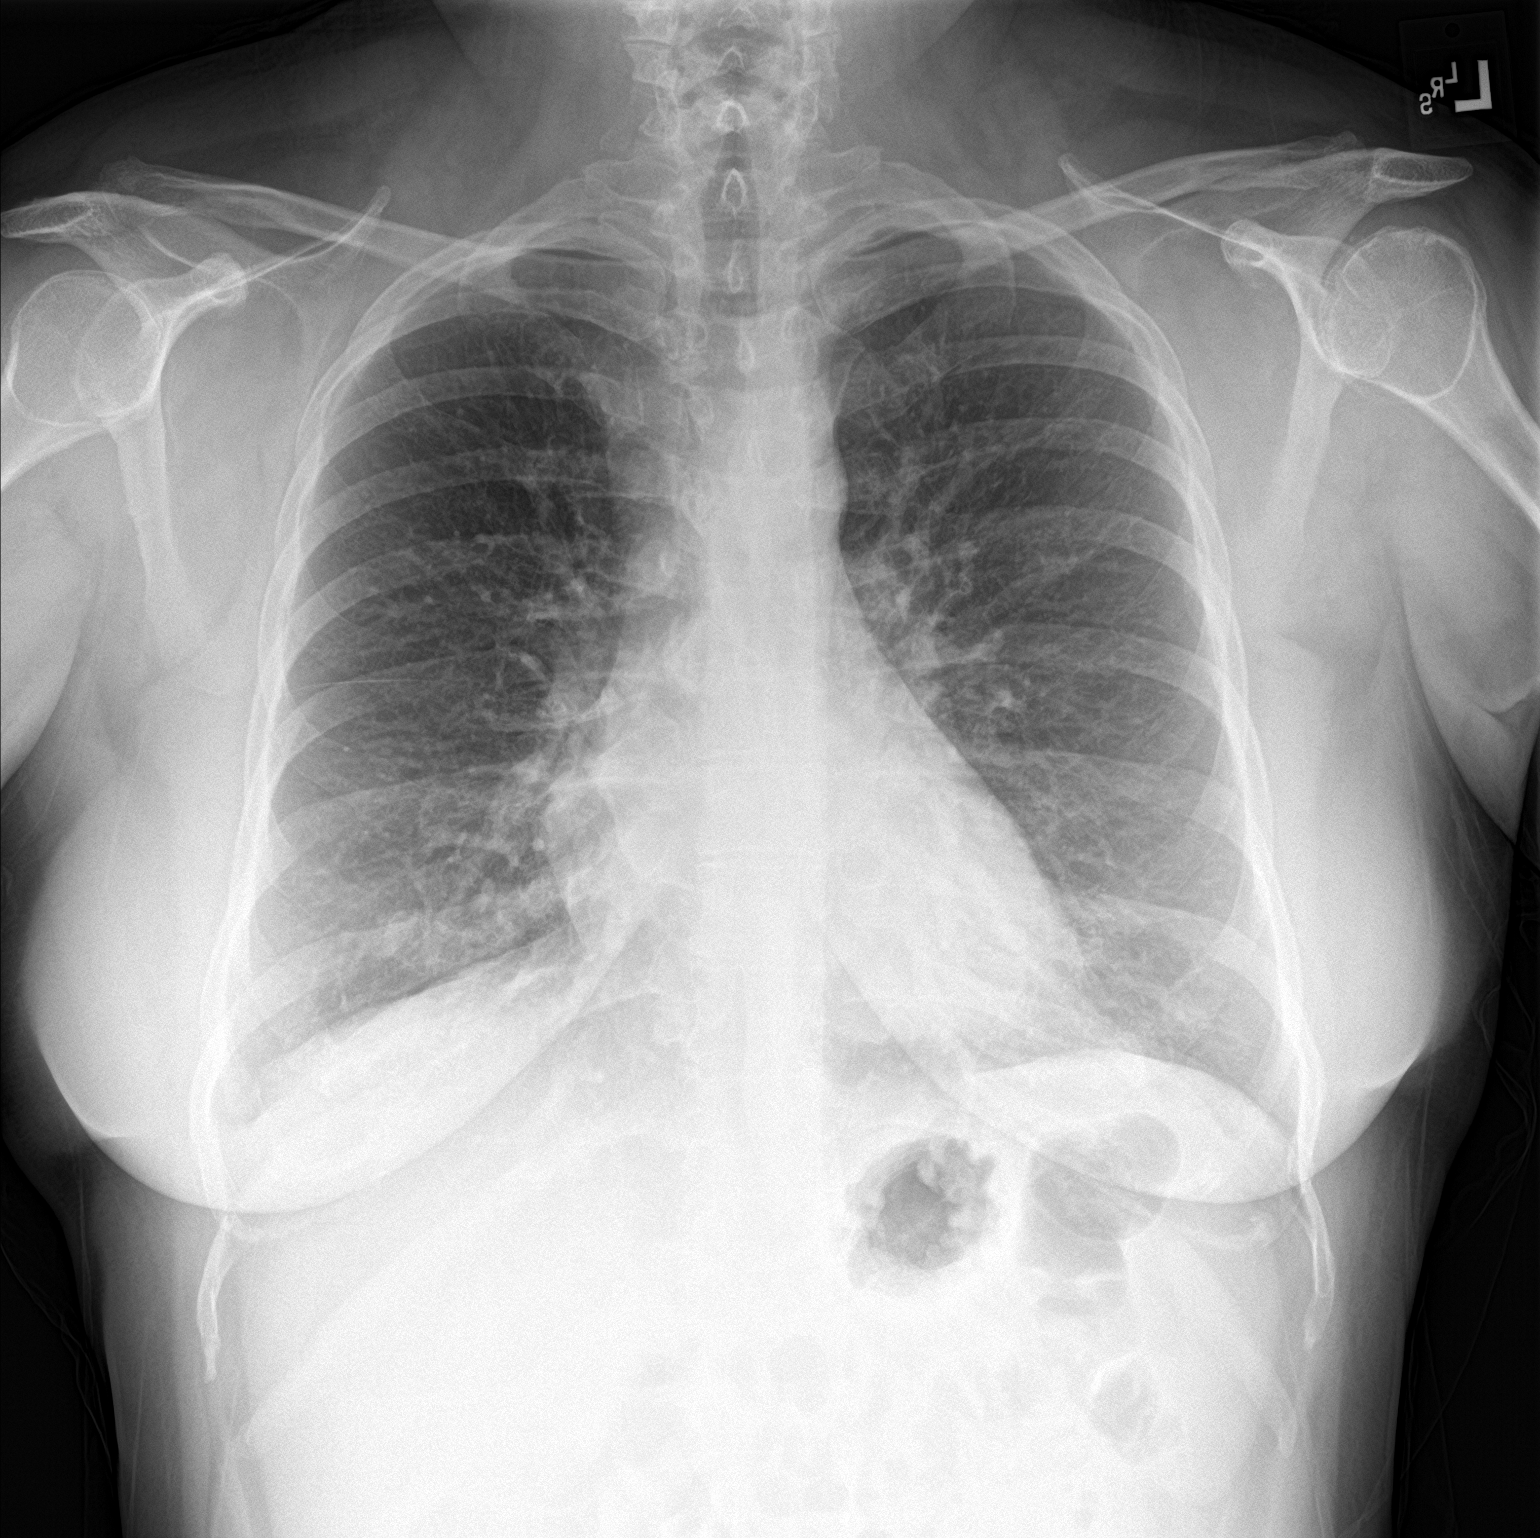

[chest lat]
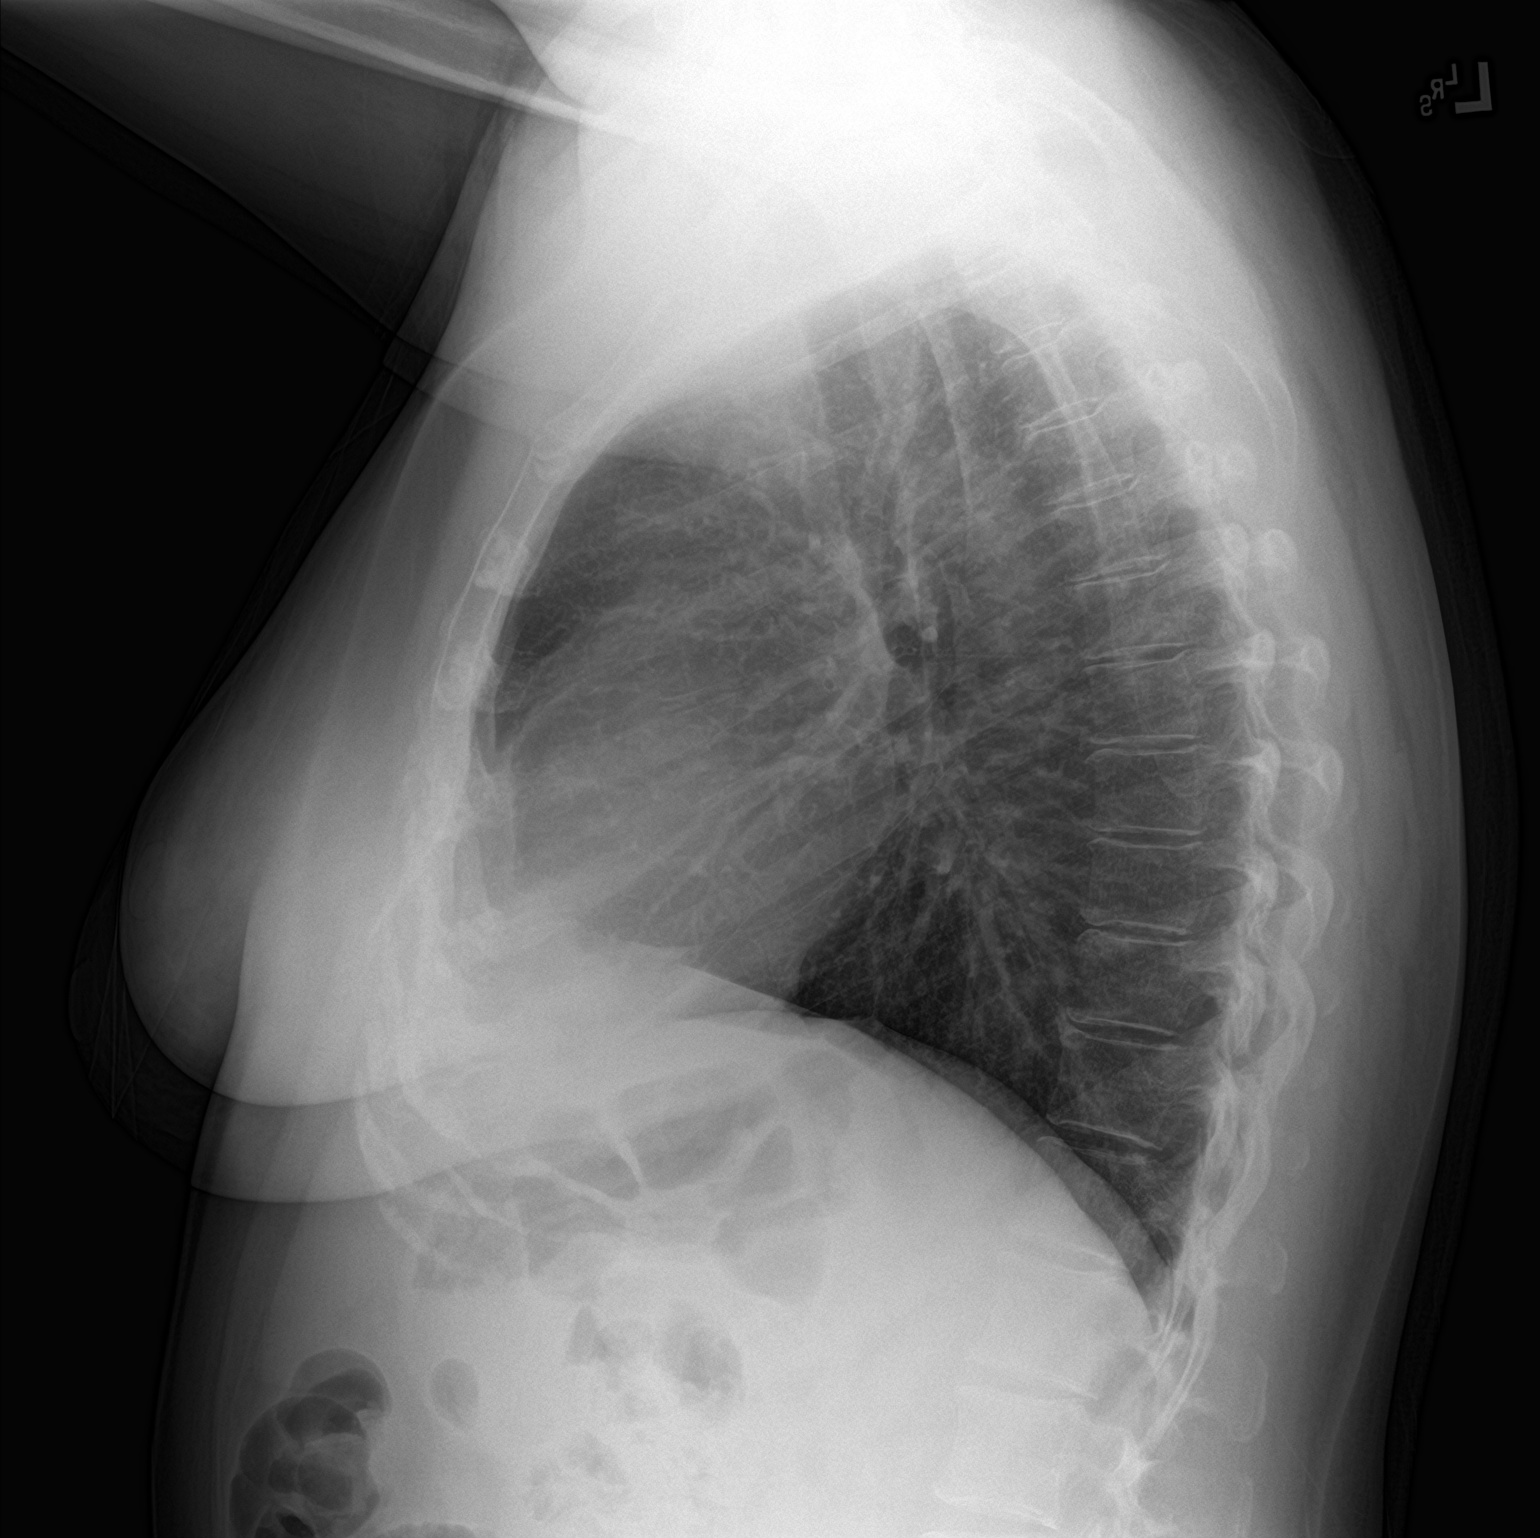

[2 of 2 positions shown; findings below may reference images not displayed]

FINDINGS: The heart size and mediastinal contours are within normal limits.
Both lungs are clear. The visualized skeletal structures are
unremarkable.
IMPRESSION: No active cardiopulmonary disease.

## 2017-12-13 MED ORDER — ONDANSETRON HCL 4 MG/2ML IJ SOLN: 4.0000 mg | Freq: Four times a day (QID) | INTRAMUSCULAR | Status: DC | PRN

## 2017-12-13 MED ORDER — SODIUM CHLORIDE 0.9 % IV SOLN
INTRAVENOUS | Status: DC
  Administered 2017-12-13: 08:00:00 via INTRAVENOUS

## 2017-12-13 MED ORDER — ADULT MULTIVITAMIN W/MINERALS CH
1.0000 | ORAL_TABLET | Freq: Every day | ORAL | Status: DC
  Administered 2017-12-13: 1 via ORAL
  Filled 2017-12-13: qty 1

## 2017-12-13 MED ORDER — LORAZEPAM 2 MG/ML IJ SOLN: 1.0000 mg | Freq: Four times a day (QID) | INTRAMUSCULAR | Status: DC | PRN

## 2017-12-13 MED ORDER — LORAZEPAM 2 MG/ML IJ SOLN: 0.0000 mg | Freq: Two times a day (BID) | INTRAMUSCULAR | Status: DC

## 2017-12-13 MED ORDER — POTASSIUM CHLORIDE CRYS ER 20 MEQ PO TBCR
40.0000 meq | EXTENDED_RELEASE_TABLET | Freq: Once | ORAL | Status: AC
  Administered 2017-12-13: 40 meq via ORAL
  Filled 2017-12-13: qty 2

## 2017-12-13 MED ORDER — MAGNESIUM SULFATE IN D5W 1-5 GM/100ML-% IV SOLN
1.0000 g | Freq: Once | INTRAVENOUS | Status: DC
  Filled 2017-12-13: qty 100

## 2017-12-13 MED ORDER — VITAMIN B-12 1000 MCG PO TABS
1000.0000 ug | ORAL_TABLET | Freq: Every day | ORAL | Status: DC
  Administered 2017-12-13: 1000 ug via ORAL
  Filled 2017-12-13: qty 1

## 2017-12-13 MED ORDER — SODIUM CHLORIDE 0.9 % IV SOLN
Freq: Once | INTRAVENOUS | Status: AC
  Administered 2017-12-13: 03:00:00 via INTRAVENOUS

## 2017-12-13 MED ORDER — POTASSIUM CHLORIDE 20 MEQ/15ML (10%) PO SOLN
40.0000 meq | Freq: Once | ORAL | Status: AC
  Administered 2017-12-13: 40 meq via ORAL
  Filled 2017-12-13: qty 30

## 2017-12-13 MED ORDER — TRAMADOL HCL 50 MG PO TABS
50.0000 mg | ORAL_TABLET | Freq: Four times a day (QID) | ORAL | Status: DC | PRN
  Administered 2017-12-13: 50 mg via ORAL
  Filled 2017-12-13: qty 1

## 2017-12-13 MED ORDER — THIAMINE HCL 100 MG/ML IJ SOLN
Freq: Once | INTRAVENOUS | Status: AC
  Administered 2017-12-13: 02:00:00 via INTRAVENOUS
  Filled 2017-12-13: qty 1000

## 2017-12-13 MED ORDER — CYANOCOBALAMIN 1000 MCG PO TABS: 1000.0000 ug | ORAL_TABLET | Freq: Every day | ORAL | 0 refills | Status: DC

## 2017-12-13 MED ORDER — FERROUS SULFATE 325 (65 FE) MG PO TABS
325.0000 mg | ORAL_TABLET | Freq: Two times a day (BID) | ORAL | 0 refills | Status: DC
Start: 2017-12-13 — End: 2018-01-14

## 2017-12-13 MED ORDER — LORAZEPAM 2 MG/ML IJ SOLN
0.5000 mg | Freq: Once | INTRAMUSCULAR | Status: AC
  Administered 2017-12-13: 0.5 mg via INTRAVENOUS
  Filled 2017-12-13: qty 1

## 2017-12-13 MED ORDER — THIAMINE HCL 100 MG/ML IJ SOLN: 100.0000 mg | Freq: Every day | INTRAMUSCULAR | Status: DC

## 2017-12-13 MED ORDER — SODIUM CHLORIDE 0.9 % IV SOLN
510.0000 mg | Freq: Once | INTRAVENOUS | Status: AC
  Administered 2017-12-13: 510 mg via INTRAVENOUS
  Filled 2017-12-13: qty 17

## 2017-12-13 MED ORDER — LORAZEPAM 1 MG PO TABS: 1.0000 mg | ORAL_TABLET | Freq: Four times a day (QID) | ORAL | Status: DC | PRN

## 2017-12-13 MED ORDER — NICOTINE 21 MG/24HR TD PT24: 21.0000 mg | MEDICATED_PATCH | Freq: Every day | TRANSDERMAL | Status: DC

## 2017-12-13 MED ORDER — FERROUS SULFATE 325 (65 FE) MG PO TABS
325.0000 mg | ORAL_TABLET | Freq: Two times a day (BID) | ORAL | Status: DC
  Administered 2017-12-13 (×2): 325 mg via ORAL
  Filled 2017-12-13 (×3): qty 1

## 2017-12-13 MED ORDER — ZOLPIDEM TARTRATE 5 MG PO TABS: 5.0000 mg | ORAL_TABLET | Freq: Every evening | ORAL | Status: DC | PRN

## 2017-12-13 MED ORDER — SENNOSIDES-DOCUSATE SODIUM 8.6-50 MG PO TABS: 1.0000 | ORAL_TABLET | Freq: Every evening | ORAL | Status: DC | PRN

## 2017-12-13 MED ORDER — VITAMIN B-1 100 MG PO TABS
100.0000 mg | ORAL_TABLET | Freq: Every day | ORAL | Status: DC
  Administered 2017-12-13: 100 mg via ORAL
  Filled 2017-12-13: qty 1

## 2017-12-13 MED ORDER — SODIUM CHLORIDE 0.9 % IV BOLUS
1000.0000 mL | Freq: Once | INTRAVENOUS | Status: AC
  Administered 2017-12-13: 1000 mL via INTRAVENOUS

## 2017-12-13 MED ORDER — ACETAMINOPHEN 650 MG RE SUPP: 650.0000 mg | Freq: Four times a day (QID) | RECTAL | Status: DC | PRN

## 2017-12-13 MED ORDER — ONDANSETRON HCL 4 MG PO TABS: 4.0000 mg | ORAL_TABLET | Freq: Four times a day (QID) | ORAL | Status: DC | PRN

## 2017-12-13 MED ORDER — ONDANSETRON HCL 4 MG/2ML IJ SOLN
4.0000 mg | Freq: Four times a day (QID) | INTRAMUSCULAR | Status: DC | PRN
  Administered 2017-12-13: 4 mg via INTRAVENOUS
  Filled 2017-12-13: qty 2

## 2017-12-13 MED ORDER — HYDRALAZINE HCL 20 MG/ML IJ SOLN: 5.0000 mg | INTRAMUSCULAR | Status: DC | PRN

## 2017-12-13 MED ORDER — FOLIC ACID 1 MG PO TABS
1.0000 mg | ORAL_TABLET | Freq: Every day | ORAL | Status: DC
  Administered 2017-12-13: 1 mg via ORAL
  Filled 2017-12-13: qty 1

## 2017-12-13 MED ORDER — PANTOPRAZOLE SODIUM 40 MG PO TBEC
40.0000 mg | DELAYED_RELEASE_TABLET | Freq: Every day | ORAL | Status: DC
  Administered 2017-12-13: 40 mg via ORAL

## 2017-12-13 MED ORDER — HYDROXYZINE HCL 10 MG PO TABS: 10.0000 mg | ORAL_TABLET | Freq: Three times a day (TID) | ORAL | Status: DC | PRN

## 2017-12-13 MED ORDER — KETOROLAC TROMETHAMINE 15 MG/ML IJ SOLN
7.5000 mg | Freq: Once | INTRAMUSCULAR | Status: AC
  Administered 2017-12-13: 7.5 mg via INTRAVENOUS
  Filled 2017-12-13: qty 1

## 2017-12-13 MED ORDER — FLUOXETINE HCL 20 MG PO TABS
20.0000 mg | ORAL_TABLET | Freq: Every day | ORAL | Status: DC
Start: 2017-12-13 — End: 2017-12-13

## 2017-12-13 MED ORDER — ACETAMINOPHEN 325 MG PO TABS: 650.0000 mg | ORAL_TABLET | Freq: Four times a day (QID) | ORAL | Status: DC | PRN

## 2017-12-13 MED ORDER — HALOPERIDOL LACTATE 5 MG/ML IJ SOLN: 2.0000 mg | Freq: Once | INTRAMUSCULAR | Status: DC

## 2017-12-13 MED ORDER — LORAZEPAM 2 MG/ML IJ SOLN: 0.0000 mg | Freq: Four times a day (QID) | INTRAMUSCULAR | Status: DC

## 2017-12-13 NOTE — Progress Notes (Signed)
Patient walked to nurses station from [place], no shortness of breath

## 2017-12-13 NOTE — ED Notes (Signed)
First unit of blood infusing at this time.  Tolerating well so far.

## 2017-12-13 NOTE — ED Notes (Signed)
Pt c/o nausea. Will look for PRN and states pain of headache continues.

## 2017-12-13 NOTE — Progress Notes (Signed)
Regan Lemming to be D/C'd Home per MD order.  Discussed with the patient and all questions fully answered.  VSS, Skin clean, dry and intact without evidence of skin break down, no evidence of skin tears noted. IV catheter discontinued intact. Site without signs and symptoms of complications. Dressing and pressure applied.  An After Visit Summary was printed and given to the patient. Patient received prescription.  D/c education completed with patient/family including follow up instructions, medication list, d/c activities limitations if indicated, with other d/c instructions as indicated by MD - patient able to verbalize understanding, all questions fully answered.   Patient instructed to return to ED, call 911, or call MD for any changes in condition.   Patient escorted via [place], and D/C home via private auto.  Betha Loa Tameka Hoiland 12/13/2017 7:05 PM

## 2017-12-13 NOTE — ED Notes (Signed)
Continues to to tolerate blood transfusion well.  Using bedside commode as needed.  Blood to be drawn after transfusion.  Dr. Blaine Hamper aware.

## 2017-12-13 NOTE — ED Provider Notes (Signed)
[facility] EMERGENCY DEPARTMENT Provider Note   CSN: [id number] Arrival date & time: 12/12/17  2159     History   Chief Complaint Chief Complaint  Patient presents with  . Anxiety  . Alcohol Intoxication    HPI Judith Drake is a 47 y.o. female.  The history is provided by the patient. No language interpreter was used.  Alcohol Intoxication  This is a recurrent problem. The current episode started 6 to 12 hours ago. The problem occurs constantly. The problem has not changed since onset.Associated symptoms include shortness of breath. Pertinent negatives include no chest pain, no abdominal pain and no headaches. Nothing aggravates the symptoms. Nothing relieves the symptoms. She has tried nothing for the symptoms. The treatment provided no relief.  Shortness of Breath  This is a new problem. The average episode lasts 12 hours. The problem occurs continuously.The current episode started 6 to 12 hours ago. The problem has not changed since onset.Pertinent negatives include no fever, no headaches, no coryza, no rhinorrhea, no sore throat, no swollen glands, no ear pain, no neck pain, no cough, no sputum production, no hemoptysis, no wheezing, no chest pain and no abdominal pain. The problem's precipitants include weather/humidity. She has tried nothing for the symptoms. The treatment provided no relief. She has had no prior hospitalizations. She has had no prior ICU admissions. Associated medical issues do not include PE, heart failure or DVT.  Has known heavy periods.    Past Medical History:  Diagnosis Date  . Anemia   . GERD (gastroesophageal reflux disease)   . Hypertension     Patient Active Problem List   Diagnosis Date Noted  . Elevated liver enzymes 11/24/2013  . Depression 11/24/2013  . Fatigue 07/12/2013  . Abnormal laboratory test 07/12/2013  . Anemia 07/12/2013  . HTN (hypertension) 07/12/2013  . ETOH abuse 07/12/2013  . Tobacco abuse  counseling 07/12/2013  . Yeast infection of the skin 07/12/2013  . Routine general medical examination at a health care facility 07/12/2013    Past Surgical History:  Procedure Laterality Date  . ELBOW SURGERY     1997   . TUBAL LIGATION       OB History   None      Home Medications    Prior to Admission medications   Medication Sig Start Date End Date Taking? Authorizing Provider  FLUoxetine (PROZAC) 20 MG tablet TAKE 1 TABLET BY MOUTH EVERY DAY 02/06/15   Rubbie Battiest, RN  hydrochlorothiazide (MICROZIDE) 12.5 MG capsule Take 1 capsule (12.5 mg total) by mouth daily. 07/12/16   Nance Pear, MD  HYDROcodone-acetaminophen (NORCO/VICODIN) 5-325 MG per tablet Take 1 tablet by mouth every 4 (four) hours as needed for moderate pain. 03/21/15   Evalee Jefferson, PA-C  lisinopril-hydrochlorothiazide (PRINZIDE,ZESTORETIC) 10-12.5 MG per tablet TAKE 1 TABLET BY MOUTH DAILY. 02/06/15   Rubbie Battiest, RN  naproxen (NAPROSYN) 500 MG tablet Take 1 tablet (500 mg total) by mouth 2 (two) times daily. 03/21/15   Evalee Jefferson, PA-C  nystatin cream (MYCOSTATIN) APPLY TOPICALLY 2 (TWO) TImes daily PRN    Rey, Latina Craver, NP    Family History Family History  Problem Relation Age of Onset  . Cancer Mother 33       Lung   . Hyperlipidemia Mother   . Hypertension Mother   . Stroke Mother   . Kidney disease Mother   . Diabetes Mother   . Heart disease Mother   . Hyperlipidemia Father   .  Autism Son   . Cancer Paternal Grandfather 56       Colon Cancer - died  . Breast cancer Neg Hx     Social History Social History   Tobacco Use  . Smoking status: Light Tobacco Smoker    Types: Cigarettes  Substance Use Topics  . Alcohol use: Yes    Comment: 6 drinks daily   . Drug use: No     Allergies   Amoxicillin   Review of Systems Review of Systems  Constitutional: Negative for fever.  HENT: Negative for ear pain, rhinorrhea and sore throat.   Eyes: Negative for photophobia and visual  disturbance.  Respiratory: Positive for shortness of breath. Negative for cough, hemoptysis, sputum production, chest tightness and wheezing.   Cardiovascular: Negative for chest pain.  Gastrointestinal: Negative for abdominal pain.  Genitourinary: Negative for dysuria, vaginal bleeding and vaginal discharge.  Musculoskeletal: Negative for neck pain.  Neurological: Negative for headaches.  Psychiatric/Behavioral: The patient is nervous/anxious and is hyperactive.   All other systems reviewed and are negative.    Physical Exam Updated Vital Signs BP (!) 161/98 (BP Location: Right Arm)   Pulse (!) 122   Temp 98.1 F (36.7 C) (Oral)   Resp 18   SpO2 100%   Physical Exam  Constitutional: She is oriented to person, place, and time. She appears well-developed and well-nourished.  HENT:  Head: Normocephalic and atraumatic.  Nose: Nose normal.  Mouth/Throat: No oropharyngeal exudate.  Eyes: Pupils are equal, round, and reactive to light. Conjunctivae are normal.  Neck: Normal range of motion. Neck supple.  Cardiovascular: Regular rhythm, normal heart sounds and intact distal pulses. Tachycardia present.  Pulmonary/Chest: Effort normal and breath sounds normal. No stridor. She has no wheezes. She has no rales.  Abdominal: Soft. Bowel sounds are normal. She exhibits no mass. There is no tenderness. There is no rebound and no guarding.  Musculoskeletal: Normal range of motion. She exhibits no tenderness.  Neurological: She is alert and oriented to person, place, and time. She displays normal reflexes.  Skin: Skin is warm and dry. Capillary refill takes less than 2 seconds.  Psychiatric: She has a normal mood and affect.     ED Treatments / Results  Labs (all labs ordered are listed, but only abnormal results are displayed) Results for orders placed or performed during the hospital encounter of 12/12/17  Comprehensive metabolic panel  Result Value Ref Range   Sodium 135 135 - 145  mmol/L   Potassium 3.0 (L) 3.5 - 5.1 mmol/L   Chloride 98 (L) 101 - 111 mmol/L   CO2 22 22 - 32 mmol/L   Glucose, Bld 92 65 - 99 mg/dL   BUN <5 (L) 6 - 20 mg/dL   Creatinine, Ser 0.63 0.44 - 1.00 mg/dL   Calcium 8.9 8.9 - 10.3 mg/dL   Total Protein 8.1 6.5 - 8.1 g/dL   Albumin 4.3 3.5 - 5.0 g/dL   AST 56 (H) 15 - 41 U/L   ALT 26 14 - 54 U/L   Alkaline Phosphatase 63 38 - 126 U/L   Total Bilirubin 0.7 0.3 - 1.2 mg/dL   GFR calc non Af Amer >60 >60 mL/min   GFR calc Af Amer >60 >60 mL/min   Anion gap 15 5 - 15  Ethanol  Result Value Ref Range   Alcohol, Ethyl (B) 339 (HH) <10 mg/dL  cbc  Result Value Ref Range   WBC 7.5 4.0 - 10.5 K/uL   RBC  3.44 (L) 3.87 - 5.11 MIL/uL   Hemoglobin 5.9 (LL) 12.0 - 15.0 g/dL   HCT 21.5 (L) 36.0 - 46.0 %   MCV 62.5 (L) 78.0 - 100.0 fL   MCH 17.2 (L) 26.0 - 34.0 pg   MCHC 27.4 (L) 30.0 - 36.0 g/dL   RDW 18.4 (H) 11.5 - 15.5 %   Platelets 417 (H) 150 - 400 K/uL  Rapid urine drug screen (hospital performed)  Result Value Ref Range   Opiates NONE DETECTED NONE DETECTED   Cocaine NONE DETECTED NONE DETECTED   Benzodiazepines NONE DETECTED NONE DETECTED   Amphetamines NONE DETECTED NONE DETECTED   Tetrahydrocannabinol NONE DETECTED NONE DETECTED   Barbiturates NONE DETECTED NONE DETECTED  I-Stat beta hCG blood, ED  Result Value Ref Range   I-stat hCG, quantitative 57.6 (H) <5 mIU/mL   Comment 3          I-stat troponin, ED  Result Value Ref Range   Troponin i, poc 0.00 0.00 - 0.08 ng/mL   Comment 3           Dg Chest 2 View  Result Date: 12/13/2017 CLINICAL DATA:  Difficulty breathing.  Shortness of breath. EXAM: CHEST - 2 VIEW COMPARISON:  06/02/2013 FINDINGS: The heart size and mediastinal contours are within normal limits. Both lungs are clear. The visualized skeletal structures are unremarkable. IMPRESSION: No active cardiopulmonary disease. Electronically Signed   By: Lucienne Capers M.D.   On: 12/13/2017 01:06    EKG  EKG  Interpretation  Date/Time:  Monday Braddock Servellon 08 2019 01:17:56 EDT Ventricular Rate:  100 PR Interval:    QRS Duration: 106 QT Interval:  397 QTC Calculation: 513 R Axis:   60 Text Interpretation:  Sinus tachycardia Prolonged QT interval Confirmed by Dory Horn) on 12/13/2017 1:24:34 AM       Radiology Dg Chest 2 View  Result Date: 12/13/2017 CLINICAL DATA:  Difficulty breathing.  Shortness of breath. EXAM: CHEST - 2 VIEW COMPARISON:  06/02/2013 FINDINGS: The heart size and mediastinal contours are within normal limits. Both lungs are clear. The visualized skeletal structures are unremarkable. IMPRESSION: No active cardiopulmonary disease. Electronically Signed   By: Lucienne Capers M.D.   On: 12/13/2017 01:06    Procedures Procedures (including critical care time)  Medications Ordered in ED Medications  sodium chloride 0.9 % bolus 1,000 mL (has no administration in time range)  dextrose 5 % and 0.9% NaCl with thiamine 650 mg, folic acid 1 mg, multivitamins adult 10 mL, magnesium sulfate 2 g infusion (has no administration in time range)  LORazepam (ATIVAN) injection 0.5 mg (has no administration in time range)      Final Clinical Impressions(s) / ED Diagnoses   SOB likely secondary to symptomatic anemia from heavy periods and etoh use   Glenwood Revoir, MD 12/13/17 3546

## 2017-12-13 NOTE — H&P (Addendum)
History and Physical    Judith Drake KZS:[id number] DOB: 08-09-1971 DOA: 12/12/2017  Referring MD/NP/PA:   PCP: Sherryl Barters, NP   Patient coming from:  The patient is coming from home.  At baseline, pt is independent for most of ADL.   Chief Complaint: generalized weakness, shortness breath, heavy menstrual period  HPI: Judith Drake is a 47 y.o. female with medical history significant of heavy and irregular menstrual period, fibroid of the uterus, hypertension, GERD, depression, anemia, tobacco abuse, echo abuse, who presents with generalized weakness, shortness breath and heavy menstrual period.  Patient states that she has hx of heavy and irregular menstrual periods. Last menstrual period was 10 days ago, which was heavy and last day for 10 days. Currently she does not have vaginal bleeding. She gradually developed shortness of breath and weakness in the past several days, which has been progressively getting worse. Patient does not have chest pain, cough, fever or chills. She denies nausea, vomiting, diarrhea, abdominal pain. No rectal bleeding or hematemesis. Denies symptoms of UTI.  ED Course: pt was found to have hemoglobin dropped from 13.4 on 10/11/13-->5.9 today, alcohol level 339, WBC 7.5, negative troponin, negative UDS, negative pregnancy test, Tylenol less than 10, salicylate less than 7, potassium 3.0, creatinine normal, tachycardia, no tachypnea, oxygen 100% on room air, temperature normal, negative chest x-ray. Patient is placed on telemetry bed for observation.  Review of Systems:   General: no fevers, chills, no body weight gain, has fatigue HEENT: no blurry vision, hearing changes or sore throat Respiratory: has dyspnea, no coughing, wheezing CV: no chest pain, no palpitations GI: no nausea, vomiting, abdominal pain, diarrhea, constipation GU: no dysuria, burning on urination, increased urinary frequency, hematuria  Ext: no leg edema Neuro: no unilateral  weakness, numbness, or tingling, no vision change or hearing loss Skin: no rash, no skin tear. MSK: No muscle spasm, no deformity, no limitation of range of movement in spin Heme: No easy bruising.  Travel history: No recent long distant travel.  Allergy:  Allergies  Allergen Reactions  . Amoxicillin Nausea And Vomiting    Past Medical History:  Diagnosis Date  . Anemia   . Fibroid   . GERD (gastroesophageal reflux disease)   . Heavy menstrual period   . Hypertension     Past Surgical History:  Procedure Laterality Date  . ELBOW SURGERY     1997   . TUBAL LIGATION      Social History:  reports that she has been smoking cigarettes.  She does not have any smokeless tobacco history on file. She reports that she drinks alcohol. She reports that she does not use drugs.  Family History:  Family History  Problem Relation Age of Onset  . Cancer Mother 41       Lung   . Hyperlipidemia Mother   . Hypertension Mother   . Stroke Mother   . Kidney disease Mother   . Diabetes Mother   . Heart disease Mother   . Hyperlipidemia Father   . Autism Son   . Cancer Paternal Grandfather 7       Colon Cancer - died  . Breast cancer Neg Hx      Prior to Admission medications   Medication Sig Start Date End Date Taking? Authorizing Provider  FLUoxetine (PROZAC) 20 MG tablet TAKE 1 TABLET BY MOUTH EVERY DAY Patient not taking: Reported on 12/13/2017 02/06/15   Rubbie Battiest, RN  hydrochlorothiazide (MICROZIDE) 12.5 MG capsule Take  1 capsule (12.5 mg total) by mouth daily. Patient not taking: Reported on 12/13/2017 07/12/16   Nance Pear, MD  HYDROcodone-acetaminophen (NORCO/VICODIN) 5-325 MG per tablet Take 1 tablet by mouth every 4 (four) hours as needed for moderate pain. Patient not taking: Reported on 12/13/2017 03/21/15   Evalee Jefferson, PA-C  lisinopril-hydrochlorothiazide (PRINZIDE,ZESTORETIC) 10-12.5 MG per tablet TAKE 1 TABLET BY MOUTH DAILY. Patient not taking: Reported on 12/13/2017  02/06/15   Rubbie Battiest, RN  naproxen (NAPROSYN) 500 MG tablet Take 1 tablet (500 mg total) by mouth 2 (two) times daily. Patient not taking: Reported on 12/13/2017 03/21/15   Evalee Jefferson, PA-C    Physical Exam: Vitals:   12/13/17 0248 12/13/17 0300 12/13/17 0301 12/13/17 0302  BP: 129/77 (!) 157/62    Pulse: (!) 105  (!) 107   Resp: 17  20   Temp: 98.1 F (36.7 C)   98.1 F (36.7 C)  TempSrc: Oral   Oral  SpO2:   100%    General: Not in acute distress. Pale looking. HEENT:       Eyes: PERRL, EOMI, no scleral icterus.       ENT: No discharge from the ears and nose, no pharynx injection, no tonsillar enlargement.        Neck: No JVD, no bruit, no mass felt. Heme: No neck lymph node enlargement. Cardiac: S1/S2, RRR, No murmurs, No gallops or rubs. Respiratory: No rales, wheezing, rhonchi or rubs. GI: Soft, nondistended, nontender, no rebound pain, no organomegaly, BS present. GU: No hematuria Ext: No pitting leg edema bilaterally. 2+DP/PT pulse bilaterally. Musculoskeletal: No joint deformities, No joint redness or warmth, no limitation of ROM in spin. Skin: No rashes.  Neuro: Alert, oriented X3, cranial nerves II-XII grossly intact, moves all extremities normally. Psych: Patient is not psychotic, no suicidal or hemocidal ideation.  Labs on Admission: I have personally reviewed following labs and imaging studies  CBC: Recent Labs  Lab 12/12/17 2254  WBC 7.5  HGB 5.9*  HCT 21.5*  MCV 62.5*  PLT 505*   Basic Metabolic Panel: Recent Labs  Lab 12/12/17 2254  NA 135  K 3.0*  CL 98*  CO2 22  GLUCOSE 92  BUN <5*  CREATININE 0.63  CALCIUM 8.9   GFR: CrCl cannot be calculated (Unknown ideal weight.). Liver Function Tests: Recent Labs  Lab 12/12/17 2254  AST 56*  ALT 26  ALKPHOS 63  BILITOT 0.7  PROT 8.1  ALBUMIN 4.3   No results for input(s): LIPASE, AMYLASE in the last 168 hours. No results for input(s): AMMONIA in the last 168 hours. Coagulation  Profile: No results for input(s): INR, PROTIME in the last 168 hours. Cardiac Enzymes: No results for input(s): CKTOTAL, CKMB, CKMBINDEX, TROPONINI in the last 168 hours. BNP (last 3 results) No results for input(s): PROBNP in the last 8760 hours. HbA1C: No results for input(s): HGBA1C in the last 72 hours. CBG: No results for input(s): GLUCAP in the last 168 hours. Lipid Profile: No results for input(s): CHOL, HDL, LDLCALC, TRIG, CHOLHDL, LDLDIRECT in the last 72 hours. Thyroid Function Tests: No results for input(s): TSH, T4TOTAL, FREET4, T3FREE, THYROIDAB in the last 72 hours. Anemia Panel: No results for input(s): VITAMINB12, FOLATE, FERRITIN, TIBC, IRON, RETICCTPCT in the last 72 hours. Urine analysis:    Component Value Date/Time   COLORURINE YELLOW 06/02/2013 1800   APPEARANCEUR CLEAR 06/02/2013 1800   LABSPEC 1.005 06/02/2013 1800   PHURINE 6.5 06/02/2013 1800   GLUCOSEU NEGATIVE 06/02/2013 1800  HGBUR NEGATIVE 06/02/2013 1800   BILIRUBINUR NEGATIVE 06/02/2013 1800   KETONESUR NEGATIVE 06/02/2013 1800   PROTEINUR NEGATIVE 06/02/2013 1800   UROBILINOGEN 0.2 06/02/2013 1800   NITRITE NEGATIVE 06/02/2013 1800   LEUKOCYTESUR NEGATIVE 06/02/2013 1800   Sepsis Labs: @LABRCNTIP (procalcitonin:4,lacticidven:4) )No results found for this or any previous visit (from the past 240 hour(s)).   Radiological Exams on Admission: Dg Chest 2 View  Result Date: 12/13/2017 CLINICAL DATA:  Difficulty breathing.  Shortness of breath. EXAM: CHEST - 2 VIEW COMPARISON:  06/02/2013 FINDINGS: The heart size and mediastinal contours are within normal limits. Both lungs are clear. The visualized skeletal structures are unremarkable. IMPRESSION: No active cardiopulmonary disease. Electronically Signed   By: Lucienne Capers M.D.   On: 12/13/2017 01:06     EKG: Independently reviewed. Sinus rhythm, QTC 513, anteroseptal infarction pattern.  Assessment/Plan Principal Problem:   Symptomatic  anemia Active Problems:   HTN (hypertension)   Alcohol abuse   Tobacco abuse   Depression   GERD (gastroesophageal reflux disease)   Heavy menstrual period   Hypokalemia   Fibroid uterus   Symptomatic anemia: Most likely due to heavy and irregular menstrual 2/2 to fibroids of uterus. Patient was seen by OB/GYN, and had vaginal ultrasound, which showed several small fibroids per patient. Hemoglobin dropped from 13.4-5.9. Currently hemodynamically stable.  - place on tele bed for obs - transfuse 2 units of blood now - IVF: 1L NS, 1L banana bag, then 100 mL per hour - Avoid NSAIDs and SQ heparin - LaB: INR, PTT and type screen - give one dose of IV Feraheam 510 mg and started ferrous sulfate, 325 mg twice a day -prn senokot - advised to f/u with Ob/Gyn  HTN: used to be on Prinzide and HCTZ -Will hold oral blood pressure medications due to severe anemia -IV hydralazine when necessary  Hypokalemia: K=3.0 on admission. - Repleted - Check Mg level  GERD: -Protonix  Tobacco abuse and Alcohol abuse: -Did counseling about importance of quitting smoking -Nicotine patch -Did counseling about the importance of quitting drinking -CIWA protocol  Depression: used to be on prozac, but not taking it now. No SI or HI -will not start now due to QTc prolongation  DVT ppx: SCD Code Status: Full code Family Communication: None at bed side.  Disposition Plan:  Anticipate discharge back to previous home environment Consults called:  none Admission status: Obs / tele          Date of Service 12/13/2017    Ivor Costa [facility] Pager [phone]  If 7PM-7AM, please contact night-coverage [email or web address] Password [email or web address] 12/13/2017, 3:05 AM

## 2017-12-13 NOTE — Progress Notes (Signed)
Patient admitted after midnight, please see H&P.  Came in with low Hgb.  Known heavy menstrual periods/alcohol abuse.  Iron studies done AFTER transfusion so not much help.  Await repeat CBC before d/c.  Judith Bear DO

## 2017-12-13 NOTE — ED Notes (Signed)
t oob to bathroom with steady gait.

## 2017-12-14 LAB — HIV ANTIBODY (ROUTINE TESTING W REFLEX): HIV Screen 4th Generation wRfx: NONREACTIVE

## 2017-12-14 NOTE — Discharge Summary (Signed)
Physician Discharge Summary  Judith Drake:[id number] DOB: 1971-02-13 DOA: 12/12/2017  PCP: Sherryl Barters, NP  Admit date: 12/12/2017 Discharge date: 12/14/2017   Recommendations for Outpatient Follow-Up:   1. Needs close GYN follow up for treatment of heavy menstrual bleeding 2. Given PO Fe-- please follow Hgb as an outpatient  3. Encouraged alcohol cessation    Discharge Diagnosis:   Principal Problem:   Symptomatic anemia Active Problems:   HTN (hypertension)   Alcohol abuse   Tobacco abuse   Depression   GERD (gastroesophageal reflux disease)   Heavy menstrual period   Hypokalemia   Fibroid uterus   Discharge disposition:  Home.   Discharge Condition: Improved.  Diet recommendation: Low sodium, heart healthy  Wound care: None.   History of Present Illness:   Judith Drake is a 47 y.o. female with medical history significant of heavy and irregular menstrual period, fibroid of the uterus, hypertension, GERD, depression, anemia, tobacco abuse, echo abuse, who presents with generalized weakness, shortness breath and heavy menstrual period.  Patient states that she has hx of heavy and irregular menstrual periods. Last menstrual period was 10 days ago, which was heavy and last day for 10 days. Currently she does not have vaginal bleeding. She gradually developed shortness of breath and weakness in the past several days, which has been progressively getting worse. Patient does not have chest pain, cough, fever or chills. She denies nausea, vomiting, diarrhea, abdominal pain. No rectal bleeding or hematemesis. Denies symptoms of UTI.     Hospital Course by Problem:   Symptomatic anemia: Most likely due to heavy and irregular menstrual 2/2 to fibroids of uterus. Patient was seen by OB/GYN, and had vaginal ultrasound, which showed several small fibroids per patient.  -s/p 2 units -PO iron - advised to f/u with Ob/Gyn  HTN: used to be on Prinzide and  HCTZ -defer to PCP  Hypokalemia: - Repleted - Mg ok  GERD: -Protonix  Tobacco abuse and Alcohol abuse: -encouraged cessation of both  Depression:  -outpatient follow up     Medical Consultants:    None.   Discharge Exam:   Vitals:   12/13/17 1530 12/13/17 1617  BP: (!) 164/82 (!) 160/95  Pulse: 79 78  Resp: 13 18  Temp:  98.5 F (36.9 C)  SpO2: 96% 98%   Vitals:   12/13/17 1400 12/13/17 1430 12/13/17 1530 12/13/17 1617  BP: (!) 160/109 (!) 146/71 (!) 164/82 (!) 160/95  Pulse: 87 85 79 78  Resp: 15 13 13 18   Temp:    98.5 F (36.9 C)  TempSrc:    Oral  SpO2: 99% 96% 96% 98%  Weight:    73.6 kg (162 lb 4.1 oz)  Height:    5\' 3"  (1.6 m)    Gen:  NAD    The results of significant diagnostics from this hospitalization (including imaging, microbiology, ancillary and laboratory) are listed below for reference.     Procedures and Diagnostic Studies:   Dg Chest 2 View  Result Date: 12/13/2017 CLINICAL DATA:  Difficulty breathing.  Shortness of breath. EXAM: CHEST - 2 VIEW COMPARISON:  06/02/2013 FINDINGS: The heart size and mediastinal contours are within normal limits. Both lungs are clear. The visualized skeletal structures are unremarkable. IMPRESSION: No active cardiopulmonary disease. Electronically Signed   By: Lucienne Capers M.D.   On: 12/13/2017 01:06     Labs:   Basic Metabolic Panel: Recent Labs  Lab 12/12/17 2254 12/13/17 0929  NA  135  --   K 3.0*  --   CL 98*  --   CO2 22  --   GLUCOSE 92  --   BUN <5*  --   CREATININE 0.63  --   CALCIUM 8.9  --   MG  --  2.2   GFR Estimated Creatinine Clearance: 85.4 mL/min (by C-G formula based on SCr of 0.63 mg/dL). Liver Function Tests: Recent Labs  Lab 12/12/17 2254  AST 56*  ALT 26  ALKPHOS 63  BILITOT 0.7  PROT 8.1  ALBUMIN 4.3   No results for input(s): LIPASE, AMYLASE in the last 168 hours. No results for input(s): AMMONIA in the last 168 hours. Coagulation  profile Recent Labs  Lab 12/13/17 0929  INR 1.17    CBC: Recent Labs  Lab 12/12/17 2254 12/13/17 1541  WBC 7.5 7.8  HGB 5.9* 7.9*  HCT 21.5* 27.3*  MCV 62.5* 66.7*  PLT 417* 293   Cardiac Enzymes: No results for input(s): CKTOTAL, CKMB, CKMBINDEX, TROPONINI in the last 168 hours. BNP: Invalid input(s): POCBNP CBG: No results for input(s): GLUCAP in the last 168 hours. D-Dimer No results for input(s): DDIMER in the last 72 hours. Hgb A1c No results for input(s): HGBA1C in the last 72 hours. Lipid Profile No results for input(s): CHOL, HDL, LDLCALC, TRIG, CHOLHDL, LDLDIRECT in the last 72 hours. Thyroid function studies Recent Labs    12/13/17 0927  TSH 4.821*   Anemia work up Recent Labs    12/13/17 0927  VITAMINB12 233  FOLATE 20.7  FERRITIN 6*  TIBC 622*  IRON 479*  RETICCTPCT 0.9   Microbiology No results found for this or any previous visit (from the past 240 hour(s)).   Discharge Instructions:   Discharge Instructions    Diet general   Complete by:  As directed    Discharge instructions   Complete by:  As directed    Cbc 1 week Be sure to take iron-- may cause constipation so use OTC stool softeners Follow up with GYN to take care of dysfunctional uterine bleeding   Increase activity slowly   Complete by:  As directed      Allergies as of 12/13/2017      Reactions   Amoxicillin Nausea And Vomiting      Medication List    STOP taking these medications   FLUoxetine 20 MG tablet Commonly known as:  PROZAC   hydrochlorothiazide 12.5 MG capsule Commonly known as:  MICROZIDE   HYDROcodone-acetaminophen 5-325 MG tablet Commonly known as:  NORCO/VICODIN   lisinopril-hydrochlorothiazide 10-12.5 MG tablet Commonly known as:  PRINZIDE,ZESTORETIC   naproxen 500 MG tablet Commonly known as:  NAPROSYN     TAKE these medications   cyanocobalamin 1000 MCG tablet Take 1 tablet (1,000 mcg total) by mouth daily.   ferrous sulfate 325 (65 FE)  MG tablet Take 1 tablet (325 mg total) by mouth 2 (two) times daily with a meal.      Follow-up Information    follow up with your PCP and GYN for anemia Follow up.            Time coordinating discharge: 35 min  Signed:  Geradine Girt   [facility] 12/14/2017, 4:37 PM

## 2017-12-15 ENCOUNTER — Telehealth: Payer: Self-pay | Admitting: Family

## 2017-12-15 LAB — BPAM RBC
BLOOD PRODUCT EXPIRATION DATE: 201904262359
BLOOD PRODUCT EXPIRATION DATE: 201904262359
BLOOD PRODUCT EXPIRATION DATE: 201904262359
Blood Product Expiration Date: 201904252359
ISSUE DATE / TIME: 201904080231
ISSUE DATE / TIME: 201904080544
ISSUE DATE / TIME: 201904091200
ISSUE DATE / TIME: 201904091200
UNIT TYPE AND RH: 6200
UNIT TYPE AND RH: 6200
UNIT TYPE AND RH: 6200
Unit Type and Rh: 6200

## 2017-12-15 LAB — TYPE AND SCREEN
ABO/RH(D): A POS
Antibody Screen: NEGATIVE
UNIT DIVISION: 0
UNIT DIVISION: 0
Unit division: 0
Unit division: 0

## 2017-12-15 NOTE — Telephone Encounter (Unsigned)
Copied from [place]. Topic: Appointment Scheduling - New Patient >> Dec 15, 2017  2:06 PM Hewitt Shorts wrote: Pt was seen in the hospital for anemia on Sunday and was told to follow up with PCP which at one time was Dr. Marcie Bal and he is not longer in practice and she is needing to see the pcp with in a week  Best number [phone] Pt needs to trancition back into the office

## 2017-12-16 NOTE — Telephone Encounter (Signed)
New patient appointment scheduled with Judith Drake, patient will be seen at [facility] walk in for follow up from recent admission. Please mail patient new patient packet to update information.

## 2018-01-03 ENCOUNTER — Encounter: Payer: Self-pay | Admitting: Family

## 2018-01-03 ENCOUNTER — Ambulatory Visit: Payer: BLUE CROSS/BLUE SHIELD | Admitting: Family

## 2018-01-03 VITALS — BP 124/82 | HR 84 | Temp 98.7°F | Wt 155.5 lb

## 2018-01-03 DIAGNOSIS — F1010 Alcohol abuse, uncomplicated: Secondary | ICD-10-CM

## 2018-01-03 DIAGNOSIS — N920 Excessive and frequent menstruation with regular cycle: Secondary | ICD-10-CM

## 2018-01-03 DIAGNOSIS — F419 Anxiety disorder, unspecified: Secondary | ICD-10-CM | POA: Insufficient documentation

## 2018-01-03 DIAGNOSIS — F32A Depression, unspecified: Secondary | ICD-10-CM | POA: Insufficient documentation

## 2018-01-03 DIAGNOSIS — F411 Generalized anxiety disorder: Secondary | ICD-10-CM

## 2018-01-03 DIAGNOSIS — M25551 Pain in right hip: Secondary | ICD-10-CM

## 2018-01-03 DIAGNOSIS — I10 Essential (primary) hypertension: Secondary | ICD-10-CM

## 2018-01-03 DIAGNOSIS — R21 Rash and other nonspecific skin eruption: Secondary | ICD-10-CM | POA: Diagnosis not present

## 2018-01-03 MED ORDER — BUSPIRONE HCL 7.5 MG PO TABS: 7.5000 mg | ORAL_TABLET | Freq: Two times a day (BID) | ORAL | 2 refills | Status: AC

## 2018-01-03 MED ORDER — PREDNISONE 10 MG PO TABS: ORAL_TABLET | ORAL | 0 refills | Status: DC

## 2018-01-03 NOTE — Assessment & Plan Note (Signed)
Pending  Labs.  Reviewed hospitalization course. Education for constipation plan while on ferrous sulfate 325 mg BID. Advised she f/u with [facility] in regards to treatment . She verbalized understanding.

## 2018-01-03 NOTE — Assessment & Plan Note (Signed)
Symptoms consistent with contact dermatitis. No evidence of cellulitis. Trial of prednisone.

## 2018-01-03 NOTE — Patient Instructions (Addendum)
Please follow up with [facility] regarding options for heavy menses.   Trial of buspar.   Suspect trochanteric bursitis , contact dermatitis. Trial of prednisone taper. Please start TOMORROW as prednisone can interfere with sleep    Constipation plan  1) Take Miralax once to twice per day until you have a bowel movement. You will end up titrating the use of Miralax. For example, you  may find that using the medication every other day or three times a week is a good bowel regimen for you. Or perhaps, twice weekly.   2)  If you do not get results with Miralax alone, you may then add Bisacodyl suppository daily to regimen until you get desired bowel results.  3) Take Colace ( stool softener) twice daily every day.   It is MOST important to drink LOTS of water and follow a HIGH fiber diet to keep foods moving through the gut. You may add Metamucil to a beverage that you drink.  Information on prevention of constipation as well as acute treatment for constipation as included below.  If there is no improvement in your symptoms, or if there is any worsening of symptoms, or if you have any additional concerns, please return to this clinic for re-evaluation; or, if we are closed, consider going to the Emergency Room for evaluation.    Constipation Prevention What is Constipation? Constipation is hard, dry bowel movements or the inability to have a bowel movement.  You can also feel like you need to have a bowel movement but not be able to.  It can also be painful when you strain to have a bowel movement.  Taking narcotic pain medicine after surgery can make you constipated, even if you have never had a problem with constipation. What Do I Need To Do? The best thing to do for constipation is to keep it from happening.  This can be done by: Adding laxatives to your daily routine, when taking prescription pain medicines after surgery. Add 17 gm Miralax daily or 100 mg Colace once or twice daily.  (Miralax is mixed in water. Colace is a pill). They soften your bowel movements to make them easier to pass and hurt less. Drink plenty of water to help flush your bowels.  (Eight, 8 ounce glasses daily) Eat foods high in fiber such as whole grains, vegetables, cereals, fruits, and prune juice (5-7 servings a day or 25 grams).  If you do not know how much fiber a food has in it you can look on the label under dietary fiber.  If you have trouble getting enough fiber in your diet you may want to consider a fiber supplement such as Metamucil or Citrucel.  Also, be aware that eating fiber without drinking enough water can make constipation worse. If you do become constipated some medications that may help are: Bisacodyl (Dulcolax) is available in tablet form or a suppository. Glycerin suppositories are also a good choice if you need a fast acting medication. Everybody is different and may have different results.  Talk to your pharmacist or health care provider about your specific problems. They can help you choose the best product for you.  Why Is It Important for Me To Do This? Being constipated is not something you have to live with.  There are many things you can do to help.   Feeling bad can interfere with your recovery after surgery.  If constipation goes on for too long it can become a very serious medical problem.  You may need to visit your doctor or go to the hospital.  That is why it is very important to drink lots of water, eat enough fiber, and keep it from happening.  Ask Questions We want to answer all of your questions and concerns.  Thats why we encourage you to use a program called Ask Me 3, created by the [employer].  By using Ask Me 3 you are encouraged to ask 3 simple (yet, potentially life saving questions) whenever you are talking with your physician, nurse or pharmacist: What is my main problem? What do I need to do? Why is it important for me to  do this? By understanding the answer to these three questions and any other questions you may have, you have the knowledge necessary to manage your health. Please feel very comfortable asking any questions. Healthcare is complicated, so if you hear an answer you do not understand, please ask your health care team to explain again.   Sources: [employer] 06-26-10 N   Trochanteric Bursitis Trochanteric bursitis is a condition that causes hip pain. Trochanteric bursitis happens when fluid-filled sacs (bursae) in the hip get irritated. Normally these sacs absorb shock and help strong bands of tissue (tendons) in your hip glide smoothly over each other and over your hip bones. What are the causes? This condition results from increased friction between the hip bones and the tendons that go over them. This condition can happen if you:  Have weak hips.  Use your hip muscles too much (overuse).  Get hit in the hip.  What increases the risk? This condition is more likely to develop in:  Women.  Adults who are middle-aged or older.  People with arthritis or a spinal condition.  People with weak buttocks muscles (gluteal muscles).  People who have one leg that is shorter than the other.  People who participate in certain kinds of athletic activities, such as: ? Running sports, especially long-distance running. ? Contact sports, like football or martial arts. ? Sports in which falls may occur, like skiing.  What are the signs or symptoms? The main symptom of this condition is pain and tenderness over the point of your hip. The pain may be:  Sharp and intense.  Dull and achy.  Felt on the outside of your thigh.  It may increase when you:  Lie on your side.  Walk or run.  Go up on stairs.  Sit.  Stand up after sitting.  Stand for long periods of time.  How is this diagnosed? This condition may be diagnosed based on:  Your symptoms.  Your medical  history.  A physical exam.  Imaging tests, such as: ? X-rays to check your bones. ? An MRI or ultrasound to check your tendons and muscles.  During your physical exam, your health care provider will check the movement and strength of your hip. He or she may press on the point of your hip to check for pain. How is this treated? This condition may be treated by:  Resting.  Reducing your activity.  Avoiding activities that cause pain.  Using crutches, a cane, or a walker to decrease the strain on your hip.  Taking medicine to help with swelling.  Having medicine injected into the bursae to help with swelling.  Using ice, heat, and massage therapy for pain relief.  Physical therapy exercises for strength and flexibility.  Surgery (rare).  Follow these instructions at home: Activity  Rest.  Avoid activities that cause pain.  Return to your normal activities as told by your health care provider. Ask your health care provider what activities are safe for you. Managing pain, stiffness, and swelling  Take over-the-counter and prescription medicines only as told by your health care provider.  If directed, apply heat to the injured area as told by your health care provider. ? Place a towel between your skin and the heat source. ? Leave the heat on for 20-30 minutes. ? Remove the heat if your skin turns bright red. This is especially important if you are unable to feel pain, heat, or cold. You may have a greater risk of getting burned.  If directed, apply ice to the injured area: ? Put ice in a plastic bag. ? Place a towel between your skin and the bag. ? Leave the ice on for 20 minutes, 2-3 times a day. General instructions  If the affected leg is one that you use for driving, ask your health care provider when it is safe to drive.  Use crutches, a cane, or a walker as told by your health care provider.  If one of your legs is shorter than the other, get fitted for a  shoe insert.  Lose weight if you are overweight. How is this prevented?  Wear supportive footwear that is appropriate for your sport.  If you have hip pain, start any new exercise or sport slowly.  Maintain physical fitness, including: ? Strength. ? Flexibility. Contact a health care provider if:  Your pain does not improve with 2-4 weeks. Get help right away if:  You develop severe pain.  You have a fever.  You develop increased redness over your hip.  You have a change in your bowel function or bladder function.  You cannot control the muscles in your feet. This information is not intended to replace advice given to you by your health care provider. Make sure you discuss any questions you have with your health care provider. Document Released: 10/01/2004 Document Revised: 04/29/2016 Document Reviewed: 08/09/2015 Elsevier Interactive Patient Education  [employer].

## 2018-01-03 NOTE — Assessment & Plan Note (Addendum)
Trial of buspar. Avoid SSRI for now due to QTc prolongation. F/u 2 months

## 2018-01-03 NOTE — Assessment & Plan Note (Signed)
Symptoms consistent with trochanteric bursitis. Education provided. Will trial prednisone. If no improvement, will consult orthopedics, order imaging

## 2018-01-03 NOTE — Progress Notes (Signed)
Subjective:    Patient ID: Judith Drake, female    DOB: [birth year], 47 y.o.   MRN: [id number]  CC: Judith Drake is a 47 y.o. female who presents today to establish care.    HPI:  Anemia- feeling better in regards to energy, weakness, and mood however the past couple of days, feels may be 'run down' again. Menses continue to be heavy with clots, 5-7days. Becoming constipated on iron, two BM in past week. No blood.   Follows with [facility] , [facility]. Discussed options regarding hysterectomy however never pursued as hard to find caregiver for autistic son for her to have surgery.   HTN- no medication at this time.   Complains of itchy rash on left forearm for 2 weeks, unchanged has spread to right arm. Has been outside, allergic to poison ivy however doesn't recall contact with this. No one else in household has rash.  No improvement with hydrocortisone otc   Complains of low back pain and right hip pain , 2 months, worsening, more on the right side.  Pain when lays on either side at night. 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 Painful to climb steps. No falls. No numbness, tingling. Had tried naproxyn without relief  Suspect broke her tailbone several years ago after fall, didn't have treatment at that time.   Complains of anxiety. Under stress, 5 yo one of son has autism and she is primary care giver. Stay at home mom.   NO si/hi. hasnt drank in 22 days. Feels more irritated. Smoking more since not drinking.   Has been on lexapro, prozac, and xanax in the past.   Patient was admitted to the hospital on 12/12/2017 to 4/9 for generalized weakness, shortness of breath, heavy menstrual period.  Hemoglobin at 5.9 today.  Alcohol level 239.  Negative troponin, pregnancy test.  Admitted for symptomatic anemia due to heavy irregular menstrual cycle, fibroids.  Vaginal ultrasound which showed several small fibroids per patient.  Transfuse 2 units of blood, started on ferrous sulfate  325 mg twice a day.  Holding hypertensive medications due to severe anemia.  Repleted potassium.  Did not start Prozac again due to QTC prolongation.  CXR normal. Hemoglobin aftr blood transfusion 7.8 ( from 5.9).   HISTORY:  Past Medical History:  Diagnosis Date  . Anemia   . Fibroid   . GERD (gastroesophageal reflux disease)   . Heavy menstrual period   . Hypertension    Past Surgical History:  Procedure Laterality Date  . ELBOW SURGERY     1997   . TUBAL LIGATION     Family History  Problem Relation Age of Onset  . Cancer Mother 80       Lung   . Hyperlipidemia Mother   . Hypertension Mother   . Stroke Mother   . Kidney disease Mother   . Diabetes Mother   . Heart disease Mother   . Hyperlipidemia Father   . Autism Son   . Cancer Paternal Grandfather 4       Colon Cancer - died  . Breast cancer Neg Hx     Allergies: Amoxicillin Current Outpatient Medications on File Prior to Visit  Medication Sig Dispense Refill  . ferrous sulfate 325 (65 FE) MG tablet Take 1 tablet (325 mg total) by mouth 2 (two) times daily with a meal. 60 tablet 0  . vitamin B-12 1000 MCG tablet Take 1 tablet (1,000 mcg total) by mouth daily. 30 tablet 0  No current facility-administered medications on file prior to visit.     Social History   Tobacco Use  . Smoking status: Light Tobacco Smoker    Types: Cigarettes  Substance Use Topics  . Alcohol use: Not Currently  . Drug use: No    Review of Systems  Constitutional: Negative for chills, fatigue (improved) and fever.  Respiratory: Negative for cough and shortness of breath.   Cardiovascular: Negative for chest pain and palpitations.  Gastrointestinal: Positive for constipation. Negative for abdominal pain, blood in stool, nausea and vomiting.  Genitourinary: Positive for vaginal bleeding.  Musculoskeletal: Positive for back pain. Negative for joint swelling.  Skin: Positive for rash.  Neurological: Negative for numbness.    Psychiatric/Behavioral: Negative for self-injury. The patient is nervous/anxious.       Objective:    BP 124/82 (BP Location: Left Arm, Patient Position: Sitting, Cuff Size: Normal)   Pulse 84   Temp 98.7 F (37.1 C) (Oral)   Wt 155 lb 8 oz (70.5 kg)   LMP 11/29/2017   SpO2 98%   BMI 27.55 kg/m  BP Readings from Last 3 Encounters:  01/03/18 124/82  12/13/17 (!) 160/95  07/12/16 (!) 178/106   Wt Readings from Last 3 Encounters:  01/03/18 155 lb 8 oz (70.5 kg)  12/13/17 162 lb 4.1 oz (73.6 kg)  07/12/16 165 lb (74.8 kg)    Physical Exam  Constitutional: She appears well-developed and well-nourished.  Eyes: Conjunctivae are normal.  Cardiovascular: Normal rate, regular rhythm, normal heart sounds and normal pulses.  Pulmonary/Chest: Effort normal and breath sounds normal. She has no wheezes. She has no rhonchi. She has no rales.  Musculoskeletal:       Right hip: She exhibits tenderness. She exhibits normal range of motion, normal strength, no bony tenderness and no swelling.       Lumbar back: She exhibits normal range of motion, no tenderness, no bony tenderness, no swelling, no edema, no pain and no spasm.  Full range of motion with flexion, tension, lateral side bends. No bony tenderness. No pain, numbness, tingling elicited with single leg raise bilaterally.   Right Hip: No limp or waddling gait. Full ROM with flexion and hip rotation in flexion.    No pain of lateral hip with  (flexion-abduction-external rotation) test.   Pain with deep palpation of greater trochanter.    Neurological: She is alert. She has normal strength. No sensory deficit.  Reflex Scores:      Patellar reflexes are 2+ on the right side and 2+ on the left side. Sensation and strength intact bilateral lower extremities.  Skin: Skin is warm and dry. Rash noted. Rash is maculopapular.     NO vesicles. Lesions are not in linear appearance. Skin is not boggy. No erythema, increase in warmth. No  fluctuance.   Psychiatric: She has a normal mood and affect. Her speech is normal and behavior is normal. Thought content normal.  Vitals reviewed.      Assessment & Plan:   Problem List Items Addressed This Visit      Cardiovascular and Mediastinum   HTN (hypertension)    Controlled without medication at this time. Will continue to monitor        Musculoskeletal and Integument   Rash    Symptoms consistent with contact dermatitis. No evidence of cellulitis. Trial of prednisone.       Relevant Medications   predniSONE (DELTASONE) 10 MG tablet     Other   Alcohol abuse  Congratulated  Patient on 22 days sobriety. Advised AA. Will follow      Heavy menstrual period - Primary    Pending  Labs.  Reviewed hospitalization course. Education for constipation plan while on ferrous sulfate 325 mg BID. Advised she f/u with [facility] in regards to treatment . She verbalized understanding.       Relevant Orders   B12 and Folate Panel   IBC panel   Vitamin B1   CBC with Differential/Platelet   TSH   Basic metabolic panel   GAD (generalized anxiety disorder)    Trial of buspar. Avoid SSRI for now due to QTc prolongation. F/u 2 months      Relevant Medications   busPIRone (BUSPAR) 7.5 MG tablet   Right hip pain    Symptoms consistent with trochanteric bursitis. Education provided. Will trial prednisone. If no improvement, will consult orthopedics, order imaging      Relevant Medications   predniSONE (DELTASONE) 10 MG tablet       I am having Judith Drake start on busPIRone and predniSONE. I am also having her maintain her ferrous sulfate and cyanocobalamin.   Meds ordered this encounter  Medications  . busPIRone (BUSPAR) 7.5 MG tablet    Sig: Take 1 tablet (7.5 mg total) by mouth 2 (two) times daily.    Dispense:  60 tablet    Refill:  2    Order Specific Question:   Supervising Provider    Answer:   Deborra Medina L [[id number]]  . predniSONE (DELTASONE)  10 MG tablet    Sig: Take 4 tablets ( total 40 mg) by mouth for 2 days; take 3 tablets ( total 30 mg) by mouth for 2 days; take 2 tablets ( total 20 mg) by mouth for 1 day; take 1 tablet ( total 10 mg) by mouth for 1 day.    Dispense:  17 tablet    Refill:  0    Order Specific Question:   Supervising Provider    Answer:   Crecencio Mc [[id number]]    Return precautions given.   Risks, benefits, and alternatives of the medications and treatment plan prescribed today were discussed, and patient expressed understanding.   Education regarding symptom management and diagnosis given to patient on AVS.  Continue to follow with Burnard Hawthorne, FNP for routine health maintenance.   Regan Lemming and I agreed with plan.   Mable Paris, FNP

## 2018-01-03 NOTE — Assessment & Plan Note (Signed)
Congratulated  Patient on 22 days sobriety. Advised AA. Will follow

## 2018-01-03 NOTE — Assessment & Plan Note (Signed)
Controlled without medication at this time. Will continue to monitor

## 2018-01-04 LAB — IBC PANEL
Iron: 102 ug/dL (ref 42–145)
SATURATION RATIOS: 22.9 % (ref 20.0–50.0)
TRANSFERRIN: 318 mg/dL (ref 212.0–360.0)

## 2018-01-04 LAB — CBC WITH DIFFERENTIAL/PLATELET
Basophils Absolute: 0.3 10*3/uL — ABNORMAL HIGH (ref 0.0–0.1)
Basophils Relative: 3.7 % — ABNORMAL HIGH (ref 0.0–3.0)
EOS PCT: 6.4 % — AB (ref 0.0–5.0)
Eosinophils Absolute: 0.4 10*3/uL (ref 0.0–0.7)
HCT: 34.8 % — ABNORMAL LOW (ref 36.0–46.0)
Hemoglobin: 11.1 g/dL — ABNORMAL LOW (ref 12.0–15.0)
LYMPHS ABS: 2.3 10*3/uL (ref 0.7–4.0)
Lymphocytes Relative: 33.8 % (ref 12.0–46.0)
MCHC: 31.8 g/dL (ref 30.0–36.0)
MCV: 76.4 fl — ABNORMAL LOW (ref 78.0–100.0)
MONO ABS: 0.4 10*3/uL (ref 0.1–1.0)
Monocytes Relative: 6.4 % (ref 3.0–12.0)
NEUTROS PCT: 49.7 % (ref 43.0–77.0)
Neutro Abs: 3.4 10*3/uL (ref 1.4–7.7)
PLATELETS: 422 10*3/uL — AB (ref 150.0–400.0)
RBC: 4.56 Mil/uL (ref 3.87–5.11)
RDW: 34.3 % — ABNORMAL HIGH (ref 11.5–15.5)
WBC: 6.8 10*3/uL (ref 4.0–10.5)

## 2018-01-04 LAB — BASIC METABOLIC PANEL
BUN: 8 mg/dL (ref 6–23)
CHLORIDE: 101 meq/L (ref 96–112)
CO2: 26 meq/L (ref 19–32)
CREATININE: 0.67 mg/dL (ref 0.40–1.20)
Calcium: 10.9 mg/dL — ABNORMAL HIGH (ref 8.4–10.5)
GFR: 100.86 mL/min (ref >60.00)
Glucose, Bld: 81 mg/dL (ref 70–99)
POTASSIUM: 4.3 meq/L (ref 3.5–5.1)
Sodium: 141 mEq/L (ref 135–145)

## 2018-01-04 LAB — TSH: TSH: 5 u[IU]/mL — AB (ref 0.35–4.50)

## 2018-01-05 ENCOUNTER — Telehealth: Payer: Self-pay | Admitting: Family

## 2018-01-05 DIAGNOSIS — R7989 Other specified abnormal findings of blood chemistry: Secondary | ICD-10-CM

## 2018-01-05 LAB — B12 AND FOLATE PANEL
FOLATE: 7.5 ng/mL (ref >5.9)
VITAMIN B 12: 861 pg/mL (ref 211–911)

## 2018-01-05 NOTE — Telephone Encounter (Signed)
Can I still add on thyroid labs?

## 2018-01-06 NOTE — Telephone Encounter (Signed)
Unable to add these test on after 24 hours from collection

## 2018-01-07 LAB — VITAMIN B1: Vitamin B1 (Thiamine): 14 nmol/L (ref 8–30)

## 2018-01-07 NOTE — Telephone Encounter (Signed)
Call pt  Unfortunately we were unable to add on thyroid studies to this weeks labs; would she come in next week for lab appt. This is NON fasting

## 2018-01-10 NOTE — Telephone Encounter (Signed)
Left message to return call 

## 2018-01-11 ENCOUNTER — Other Ambulatory Visit (INDEPENDENT_AMBULATORY_CARE_PROVIDER_SITE_OTHER): Payer: BLUE CROSS/BLUE SHIELD

## 2018-01-11 DIAGNOSIS — R7989 Other specified abnormal findings of blood chemistry: Secondary | ICD-10-CM

## 2018-01-12 ENCOUNTER — Telehealth: Payer: Self-pay | Admitting: Family

## 2018-01-12 ENCOUNTER — Other Ambulatory Visit: Payer: Self-pay | Admitting: Family

## 2018-01-12 DIAGNOSIS — R7989 Other specified abnormal findings of blood chemistry: Secondary | ICD-10-CM

## 2018-01-12 NOTE — Telephone Encounter (Signed)
Please advise 

## 2018-01-12 NOTE — Telephone Encounter (Signed)
Copied from [place] [id number]. Topic: Quick Communication - Rx Refill/Question >> Jan 12, 2018  9:45 AM Margot Ables wrote: Medication: b12 1046mcg & ferrous sulfate 325mg  - meds previous filled by ER doctor - pt will be out tomorrow Has the patient contacted their pharmacy? Yes, no refills from ER, advised to call PCP (Agent: If no, request that the patient contact the pharmacy for the refill.) Preferred Pharmacy (with phone number or street name): [id number] [address], [place] [phone] (Phone) [phone] (Fax)

## 2018-01-12 NOTE — Telephone Encounter (Signed)
Refill of Ferrous Sulfate and Vit B- 12  Historic provider  LRF 12/13/17  #30  0 refills  LOV 01/03/18  M. Arnett

## 2018-01-12 NOTE — Progress Notes (Signed)
close

## 2018-01-13 LAB — T3, FREE: T3, Free: 3.1 pg/mL (ref 2.3–4.2)

## 2018-01-13 LAB — THYROTROPIN RECEPTOR AUTOABS: Thyrotropin Receptor Ab: <6 % (ref <=16.0)

## 2018-01-13 LAB — T4, FREE: Free T4: 1.2 ng/dL (ref 0.8–1.8)

## 2018-01-13 NOTE — Telephone Encounter (Signed)
Please refill both medications   Please also ensure pt has a follow up with me   Thanks !

## 2018-01-14 MED ORDER — FERROUS SULFATE 325 (65 FE) MG PO TABS: 325.0000 mg | ORAL_TABLET | Freq: Two times a day (BID) | ORAL | 2 refills | Status: DC

## 2018-01-14 MED ORDER — CYANOCOBALAMIN 1000 MCG PO TABS: 1000.0000 ug | ORAL_TABLET | Freq: Every day | ORAL | 2 refills | Status: DC

## 2018-01-14 NOTE — Telephone Encounter (Signed)
Lab appointment scheduled 

## 2018-01-14 NOTE — Telephone Encounter (Signed)
Pt following up on refill request.  Pt states she is out and needs today please! Thank you!!

## 2018-01-14 NOTE — Telephone Encounter (Signed)
Script sent, patient aware

## 2018-01-18 ENCOUNTER — Other Ambulatory Visit (INDEPENDENT_AMBULATORY_CARE_PROVIDER_SITE_OTHER): Payer: BLUE CROSS/BLUE SHIELD

## 2018-01-18 DIAGNOSIS — R7989 Other specified abnormal findings of blood chemistry: Secondary | ICD-10-CM | POA: Diagnosis not present

## 2018-01-18 LAB — T3, FREE: T3, Free: 3.5 pg/mL (ref 2.3–4.2)

## 2018-01-18 LAB — T4, FREE: Free T4: 0.58 ng/dL — ABNORMAL LOW (ref 0.60–1.60)

## 2018-01-18 LAB — TSH: TSH: 3.02 u[IU]/mL (ref 0.35–4.50)

## 2018-01-19 ENCOUNTER — Telehealth: Payer: Self-pay | Admitting: Family

## 2018-01-19 DIAGNOSIS — M25551 Pain in right hip: Secondary | ICD-10-CM

## 2018-01-19 NOTE — Telephone Encounter (Signed)
Call pt Sorry for no lasting improvement I put in referral for [facility] in [place]. He is great  We willl cal her with an appt.  Advise to let us know if she doesn't hear from [place] in a week  NOW... If really acute pain, please ensure she is aware of [facility] which has walk in clinic.

## 2018-01-19 NOTE — Telephone Encounter (Signed)
Please advise 

## 2018-01-19 NOTE — Telephone Encounter (Signed)
Copied from [place] [id number]. Topic: Quick Communication - See Telephone Encounter >> Jan 19, 2018 10:59 AM Ether Griffins B wrote: CRM for notification. See Telephone encounter for: 01/19/18.  Pt was in the office a few weeks ago with Vidal Schwalbe, FNP and spoke with her about some hip pain she was having. The predniSONE (DELTASONE) 10 MG tablet has helped for a few days but the pain is back again. She is wanting to know what needs to be done. If she needs to come back to the office or if a referral should be made. She states the pain in unbearable when she sits still or goes to drive somewhere. She is not able to sleep at night due to not being able to lay on either side.

## 2018-01-20 NOTE — Telephone Encounter (Signed)
Patient notified and voiced understanding she will go to [facility] any acute pain .

## 2018-01-21 ENCOUNTER — Encounter: Payer: Self-pay | Admitting: Family

## 2018-02-08 ENCOUNTER — Ambulatory Visit: Payer: BLUE CROSS/BLUE SHIELD | Admitting: Family Medicine

## 2018-02-08 ENCOUNTER — Ambulatory Visit (INDEPENDENT_AMBULATORY_CARE_PROVIDER_SITE_OTHER)
Admission: RE | Admit: 2018-02-08 | Discharge: 2018-02-08 | Disposition: A | Discharge status: Home / Self Care | Payer: BLUE CROSS/BLUE SHIELD | Source: Ambulatory Visit | Attending: Family Medicine | Admitting: Family Medicine

## 2018-02-08 ENCOUNTER — Ambulatory Visit: Payer: Self-pay

## 2018-02-08 ENCOUNTER — Encounter: Payer: Self-pay | Admitting: Family Medicine

## 2018-02-08 VITALS — BP 144/80 | HR 90 | Ht 63.0 in | Wt 151.0 lb

## 2018-02-08 DIAGNOSIS — M25551 Pain in right hip: Secondary | ICD-10-CM

## 2018-02-08 DIAGNOSIS — M545 Low back pain: Secondary | ICD-10-CM | POA: Diagnosis not present

## 2018-02-08 DIAGNOSIS — M25552 Pain in left hip: Secondary | ICD-10-CM

## 2018-02-08 DIAGNOSIS — M5416 Radiculopathy, lumbar region: Secondary | ICD-10-CM | POA: Insufficient documentation

## 2018-02-08 IMAGING — DX DG LUMBAR SPINE COMPLETE 4+V
5 series · 5 of 5 positions shown · non-contrast
Comparison: [DATE] CT.

CLINICAL DATA: 46-year-old female lower back pain and bilateral hip
pain for 6 months worsening. Right leg pain. No known injury.
Initial encounter.

EXAM:
LUMBAR SPINE - COMPLETE 4+ VIEW

[l-spine ap]
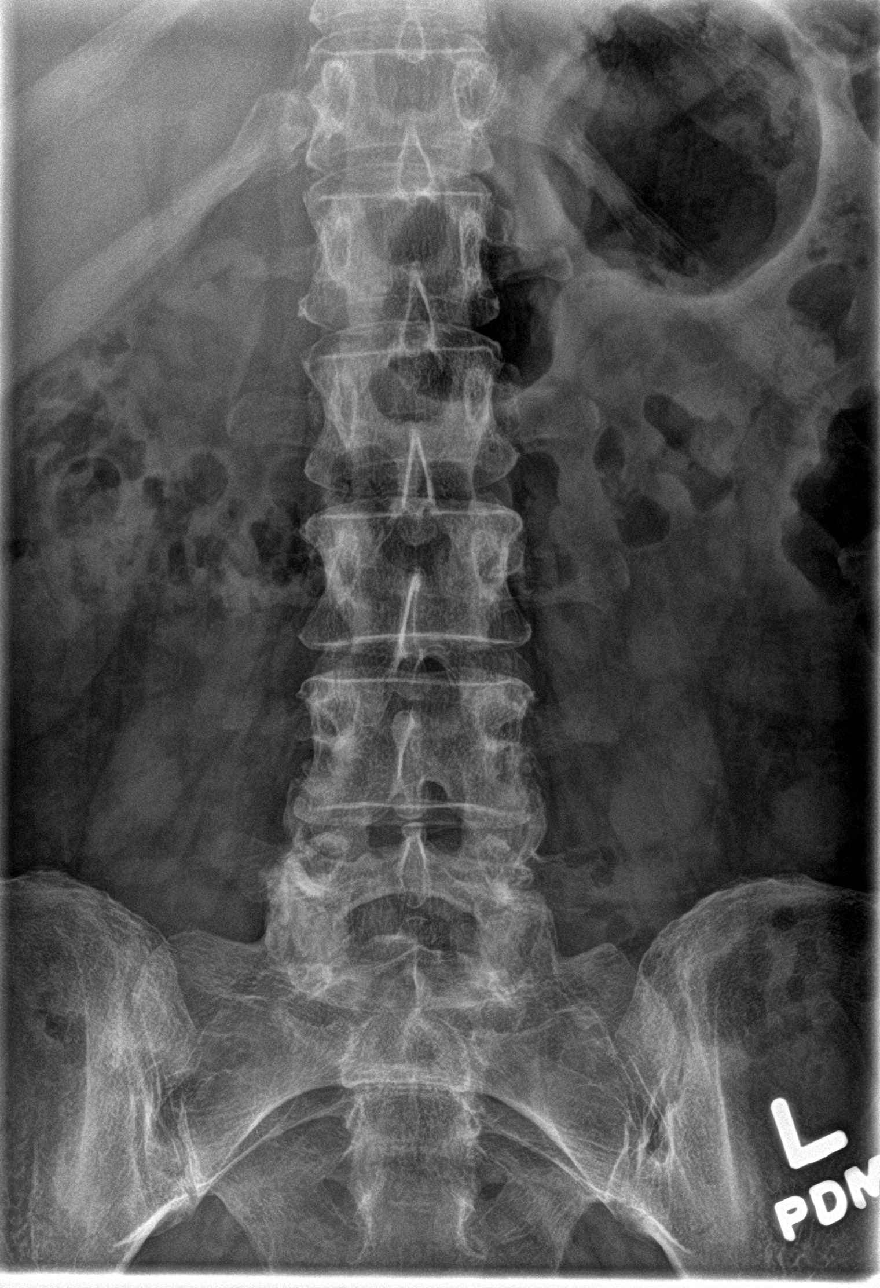

[l-spine obl (1 of 2)]
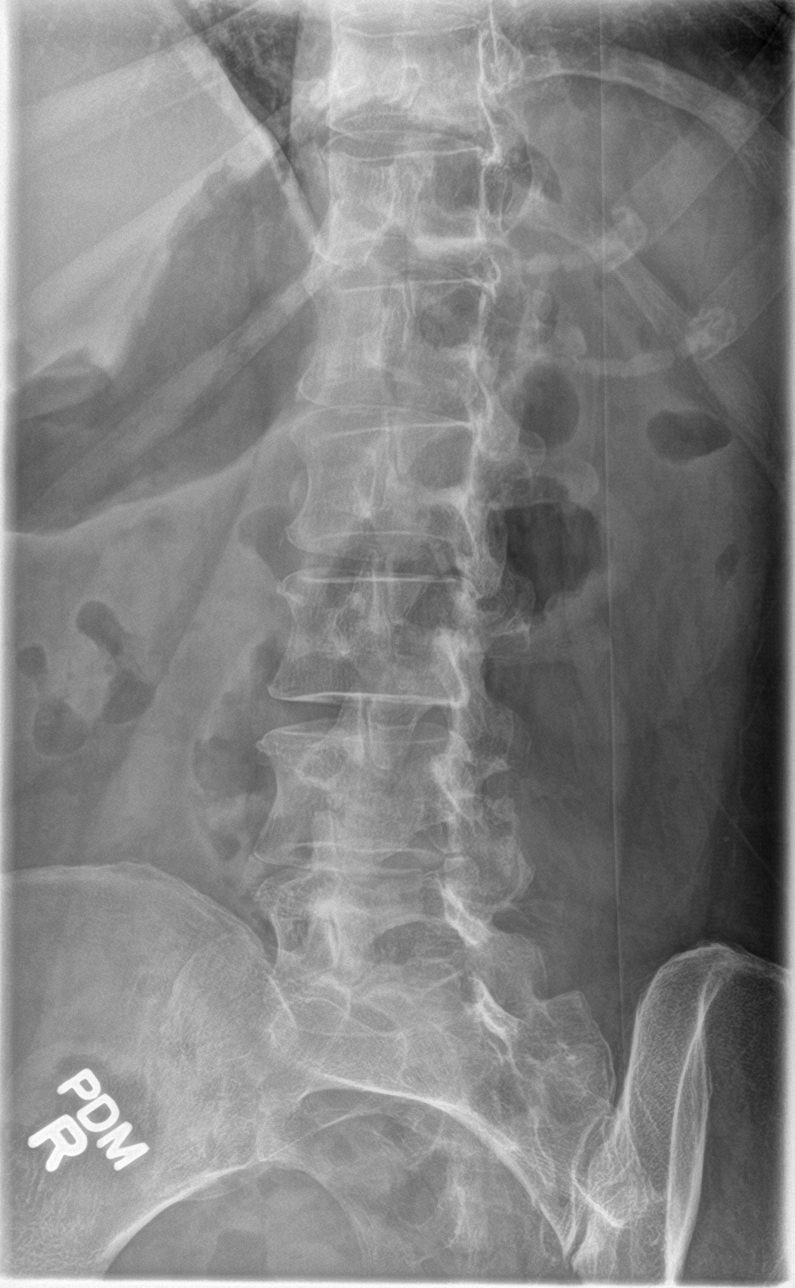

[l-spine obl (2 of 2)]
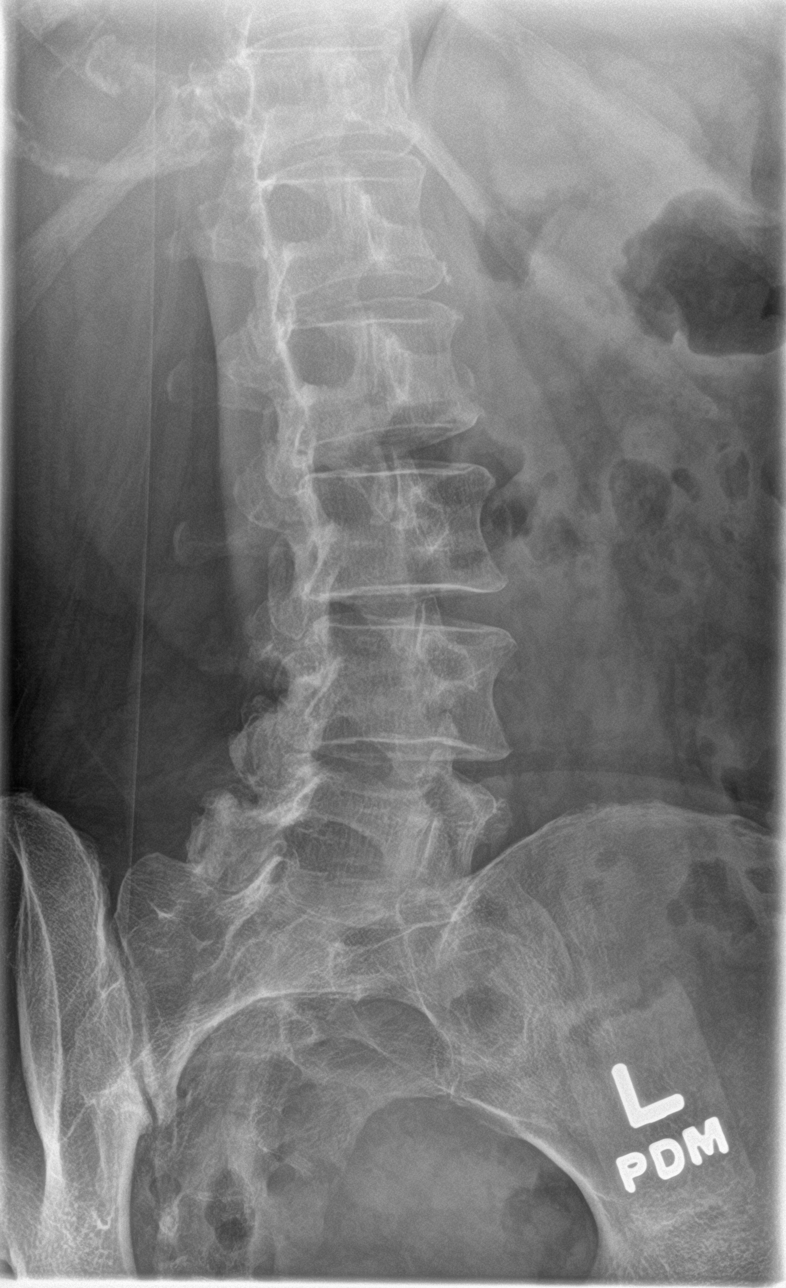

[l-spine lat]
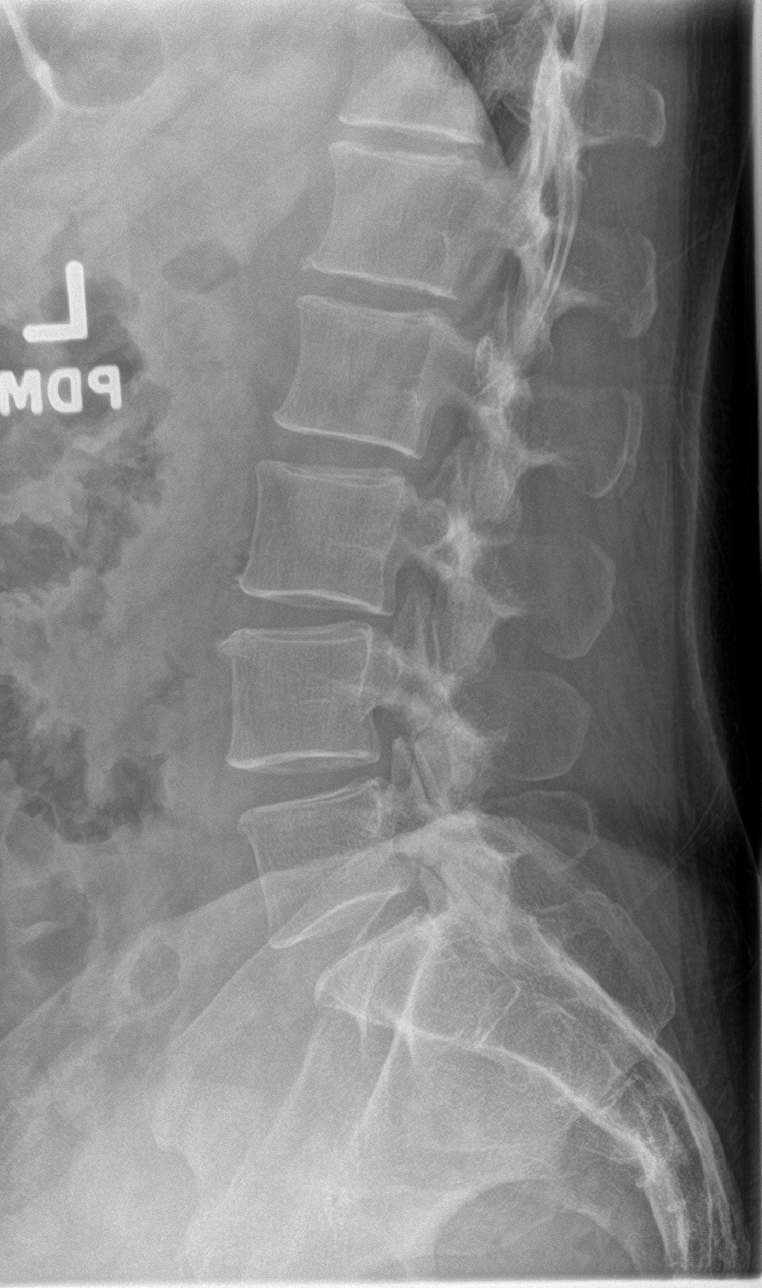

[l-spine spot]
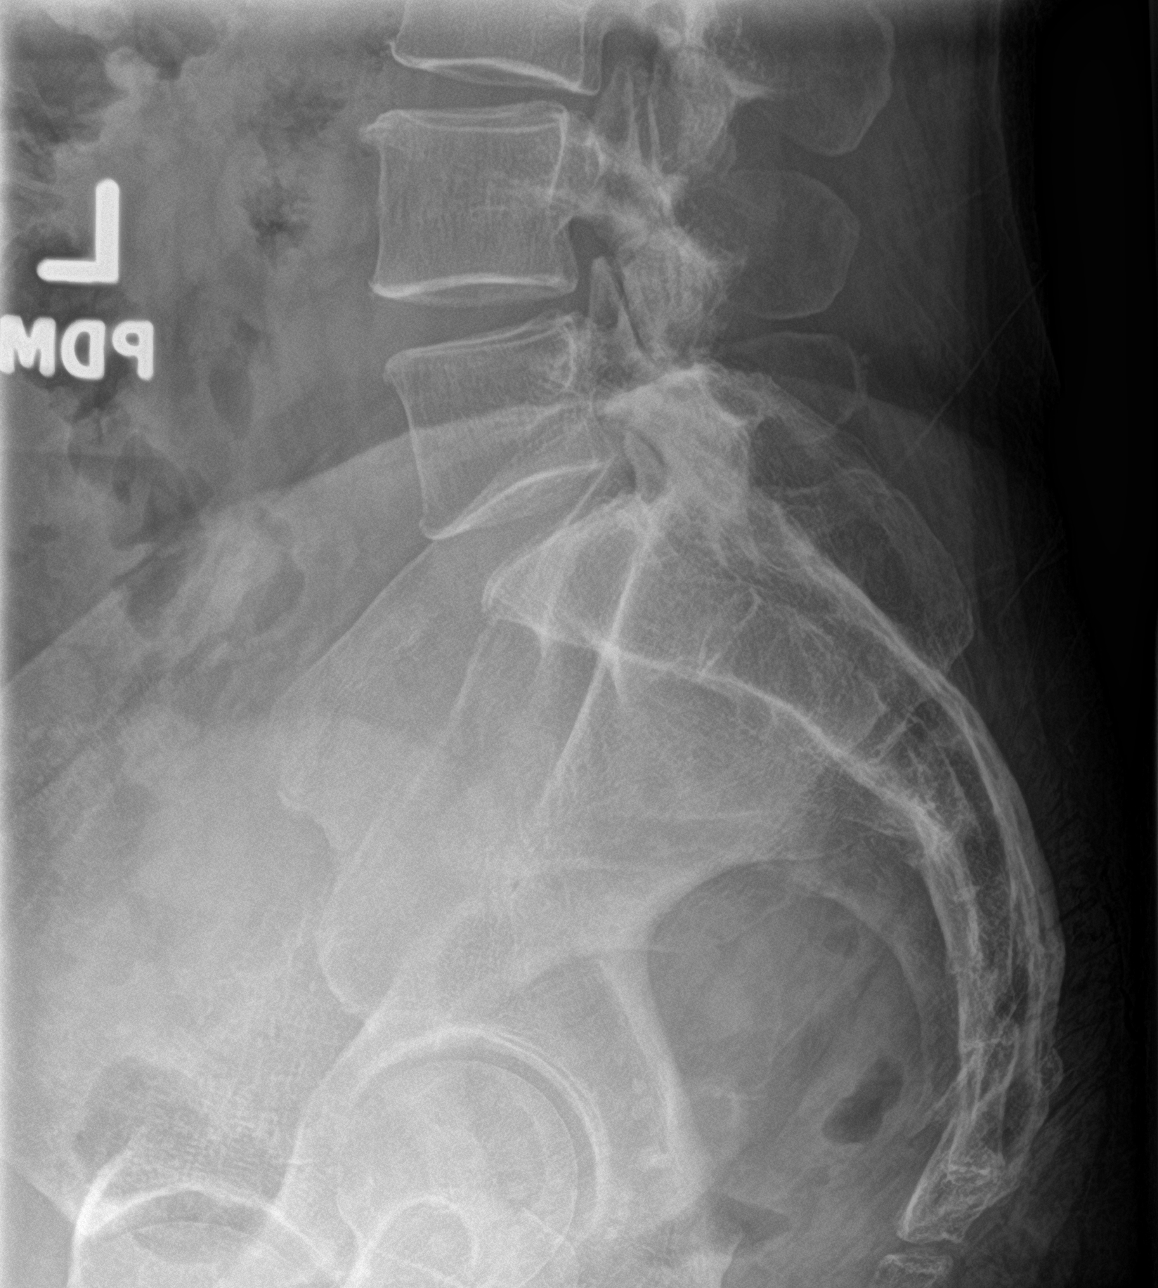

[5 of 5 positions shown; findings below may reference images not displayed]

FINDINGS: Normal alignment lumbar spine.

Minimal narrowing posterior aspect L3-4 disc space.

No pars defect or compression fracture.
IMPRESSION: Minimal narrowing posterior aspect L3-4 disc space.

## 2018-02-08 IMAGING — DX DG PELVIS 1-2V
1 series · 1 of 1 positions shown · non-contrast
Comparison: [DATE] CT.

CLINICAL DATA: 46-year-old female lower back pain and bilateral hip
pain for 6 months worsening. Right leg pain. No known injury.
Initial encounter.

EXAM:
PELVIS - 1-2 VIEW

[pelvis ap]
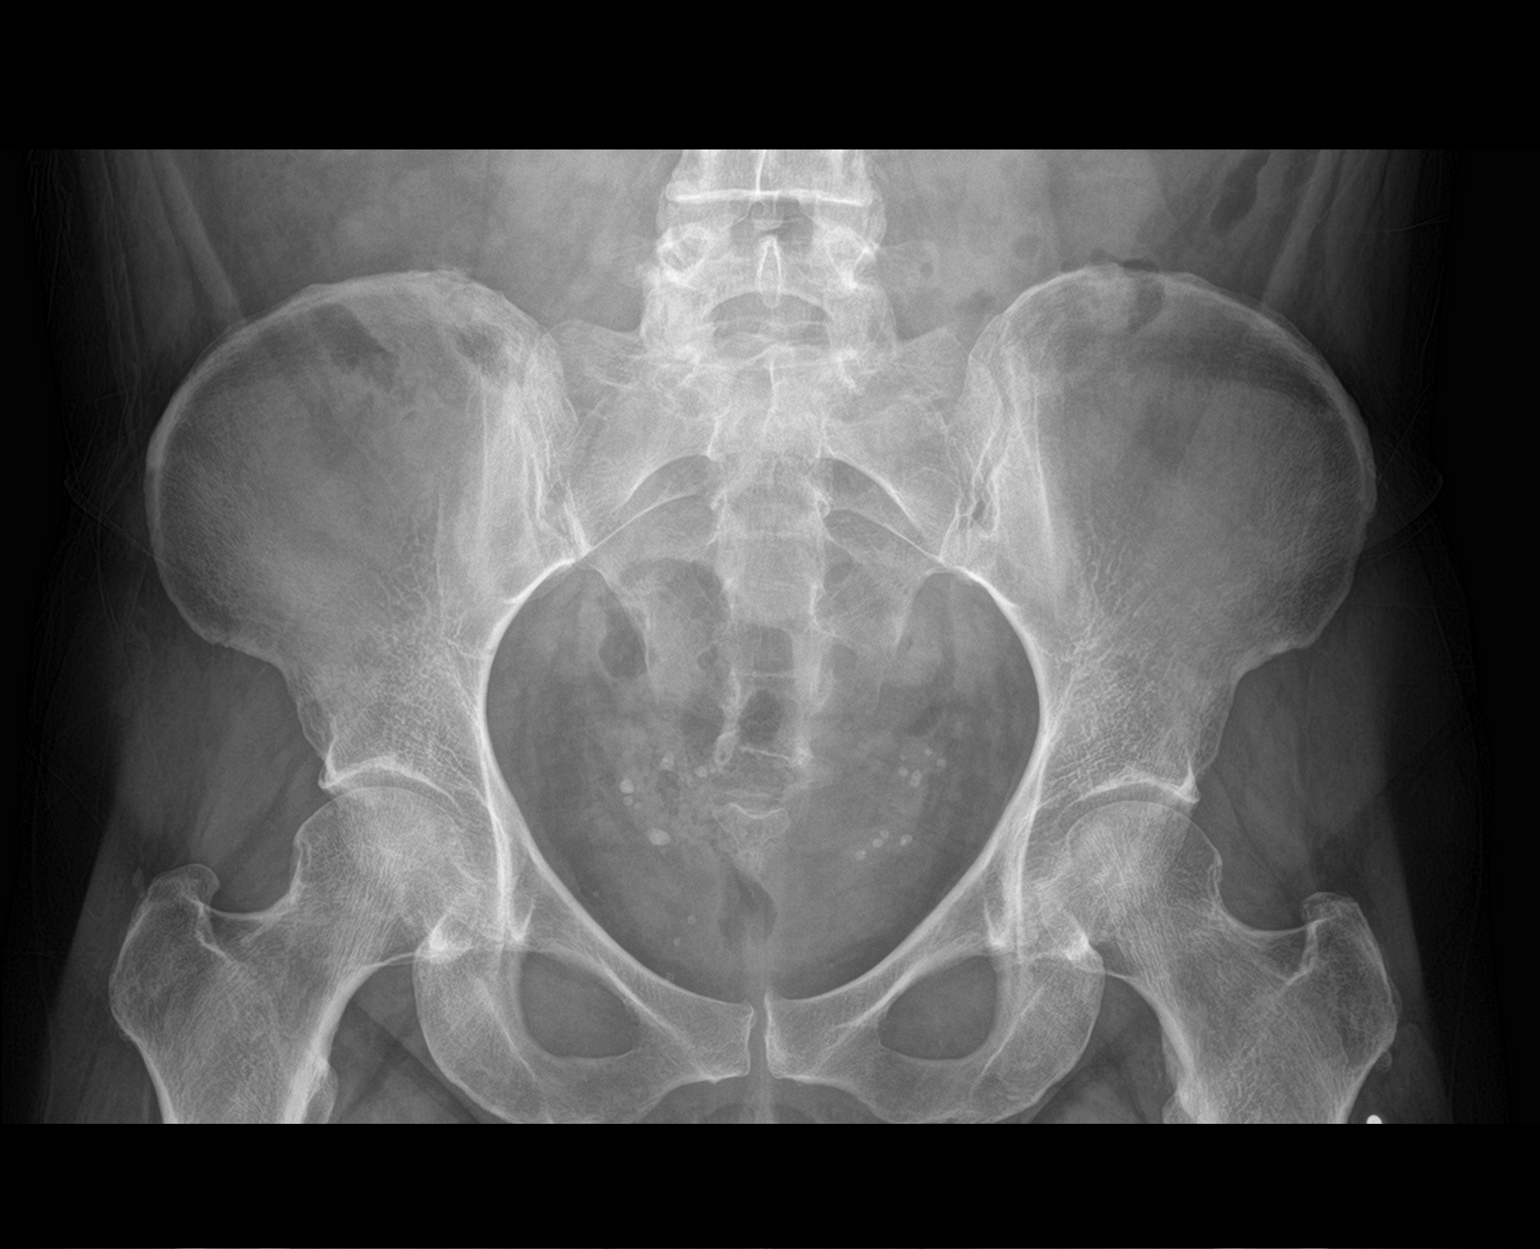

[1 of 1 positions shown; findings below may reference images not displayed]

FINDINGS: No fracture or dislocation.

No plain film evidence of femoral head avascular necrosis or
significant hip joint space narrowing.

Minimal sclerosis upper aspect right sacroiliac joint.
IMPRESSION: No hip abnormality detected.

Minimal sclerosis superior aspect right sacroiliac joint.

## 2018-02-08 MED ORDER — METHYLPREDNISOLONE ACETATE 80 MG/ML IJ SUSP
80.0000 mg | Freq: Once | INTRAMUSCULAR | Status: AC
  Administered 2018-02-08: 80 mg via INTRAMUSCULAR

## 2018-02-08 MED ORDER — KETOROLAC TROMETHAMINE 60 MG/2ML IM SOLN
60.0000 mg | Freq: Once | INTRAMUSCULAR | Status: AC
  Administered 2018-02-08: 60 mg via INTRAMUSCULAR

## 2018-02-08 MED ORDER — GABAPENTIN 100 MG PO CAPS: 200.0000 mg | ORAL_CAPSULE | Freq: Every day | ORAL | 3 refills | Status: DC

## 2018-02-08 MED ORDER — PREDNISONE 50 MG PO TABS: 50.0000 mg | ORAL_TABLET | Freq: Every day | ORAL | 0 refills | Status: DC

## 2018-02-08 NOTE — Assessment & Plan Note (Signed)
Discussed icing regimen and home exercises.  Patient does have radicular symptoms that is 30 degrees of flexion.  Some mild weakness.  Severe loss of lordosis.  X-rays pending.  Discussed prednisone and gabapentin.  Discussed icing regimen and home exercise.  Discussed which activities to do which wants to avoid.  Follow-up again in 1 week

## 2018-02-08 NOTE — Patient Instructions (Signed)
Good to see you  Judith Drake is your friend. Ice 20 minutes 2 times daily. Usually after activity and before bed. 2 injections today  Starting tomorrow prednisone daily for 5 days Gabapentin 200mg  daily  Xrays downstairs Over the counter get  Vitamin D 2000 IU daily  Turmeric 500mg  daily  Tart cherry extract any dose at night any dose See me again in 1 week (ok to double book)

## 2018-02-08 NOTE — Progress Notes (Signed)
Judith Drake, [place] [address] Phone: [phone] Subjective:     CC: bilateral hip pain   Judith Drake  Judith Drake is a 47 y.o. female coming in with complaint of bilateral hip pain. Lower back to tail bone that radiates anterior.Also radiates to her knees. Right worse than left. Painful with standing. Was diagnosed with bursitis and prescribed prednisone.   Onset- 6 months  Location- Lower back tailbone that radiates anterior Duration- wakes her up at night Character- Sharp Aggravating factors- sitting for long periods of time Severity-7 out of 10 and worsening     Past Medical History:  Diagnosis Date  . Anemia   . Fibroid   . GERD (gastroesophageal reflux disease)   . Heavy menstrual period   . Hypertension    Past Surgical History:  Procedure Laterality Date  . ELBOW SURGERY     1997   . TUBAL LIGATION     Social History   Socioeconomic History  . Marital status: Married    Spouse name: Judith Drake  . Number of children: 2  . Years of education: 66  . Highest education level: Not on file  Occupational History  . Occupation: Agricultural engineer   Social Needs  . Financial resource strain: Not on file  . Food insecurity:    Worry: Not on file    Inability: Not on file  . Transportation needs:    Medical: Not on file    Non-medical: Not on file  Tobacco Use  . Smoking status: Light Tobacco Smoker    Types: Cigarettes  . Smokeless tobacco: Current User  Substance and Sexual Activity  . Alcohol use: Not Currently  . Drug use: No  . Sexual activity: Yes    Partners: Male    Birth control/protection: Surgical  Lifestyle  . Physical activity:    Days per week: Not on file    Minutes per session: Not on file  . Stress: Not on file  Relationships  . Social connections:    Talks on phone: Not on file    Gets together: Not on file    Attends religious service: Not on file    Active member of club or  organization: Not on file    Attends meetings of clubs or organizations: Not on file    Relationship status: Not on file  Other Topics Concern  . Not on file  Social History Narrative   Judith Drake grew up in [place]. She attended [employer] for a certificate in accounting. She currently lives a home with her husband and 2 boys. Her oldest son has autism. She enjoys going out with her family.    Allergies  Allergen Reactions  . Amoxicillin Nausea And Vomiting   Family History  Problem Relation Age of Onset  . Cancer Mother 60       Lung   . Hyperlipidemia Mother   . Hypertension Mother   . Stroke Mother   . Kidney disease Mother   . Diabetes Mother   . Heart disease Mother   . Hyperlipidemia Father   . Autism Son   . Cancer Paternal Grandfather 74       Colon Cancer - died  . Breast cancer Neg Hx      Past medical history, social, surgical and family history all reviewed in electronic medical record.  No pertanent information unless stated regarding to the chief complaint.   Review of Systems:Review of systems updated and  as accurate as of 02/08/18  No headache, visual changes, nausea, vomiting, diarrhea, constipation, dizziness, abdominal pain, skin rash, fevers, chills, night sweats, weight loss, swollen lymph nodes, body aches, joint swelling,  chest pain, shortness of breath, mood changes.  Positive muscle aches  Objective  Blood pressure (!) 144/80, pulse 90, height 5\' 3"  (1.6 m), weight 151 lb (68.5 kg), SpO2 97 %. Systems examined below as of 02/08/18   General: No apparent distress alert and oriented x3 mood and affect normal, dressed appropriately.  HEENT: Pupils equal, extraocular movements intact  Respiratory: Patient's speak in full sentences and does not appear short of breath  Cardiovascular: No lower extremity edema, non tender, no erythema  Skin: Warm dry intact with no signs of infection or rash on extremities or on axial skeleton.  Abdomen:  Soft nontender  Neuro: Cranial nerves II through XII are intact, neurovascularly intact in all extremities with 2+ DTRs and 2+ pulses.  Lymph: No lymphadenopathy of posterior or anterior cervical chain or axillae bilaterally.  Gait normal with good balance and coordination.  MSK:  Non tender with full range of motion and good stability and symmetric strength and tone of shoulders, elbows, wrist, hip, knee and ankles bilaterally.  Back Exam:  Inspection: Severe loss of lordosis Motion: Flexion 25 deg, Extension 5 deg, Side Bending to 25 deg bilaterally,  Rotation to 35 deg bilaterally  SLR laying: Positive on the right XSLR laying: Negative  Palpable tenderness: Tender to palpation paraspinal musculature lumbar spine FABER: Positive Faber bilaterally. Sensory change: Gross sensation intact to all lumbar and sacral dermatomes.  Reflexes: 2+ at both patellar tendons, 2+ at achilles tendons, Babinski's downgoing.  Strength at foot  Plantar-flexion: 5/5 Dorsi-flexion: 5/5 Eversion: 5/5 Inversion: 5/5  Leg strength  Quad: 5/5 Hamstring: 5/5 Hip flexor: 4/5 on the right compared to left hip abductors: 4/5 symmetric Gait unremarkable.     Impression and Recommendations:     This case required medical decision making of moderate complexity.      Note: This dictation was prepared with Dragon dictation along with smaller phrase technology. Any transcriptional errors that result from this process are unintentional.

## 2018-02-14 NOTE — Progress Notes (Signed)
[facility] [address], [place] [address] Phone: [phone] Subjective:     CC: Right back pain  IDP:[id number]  Judith Drake is a 47 y.o. female coming in with complaint of right hip pain. She felt better after getting an injection. She also took the prednisone which helped but she states that her pain is coming back. Painful when lying on side at night. Patient is having radiating pain down the lateral aspect of leg. Complains of popping in SI joint with hip flexion.  Patient still has severe pain of her elbow.   X-rays were reviewed by me showing the patient did have sclerotic changes of the right sacroiliac joint  Past Medical History:  Diagnosis Date  . Anemia   . Fibroid   . GERD (gastroesophageal reflux disease)   . Heavy menstrual period   . Hypertension    Past Surgical History:  Procedure Laterality Date  . ELBOW SURGERY     1997   . TUBAL LIGATION     Social History   Socioeconomic History  . Marital status: Married    Spouse name: Lanny Hurst  . Number of children: 2  . Years of education: 38  . Highest education level: Not on file  Occupational History  . Occupation: Agricultural engineer   Social Needs  . Financial resource strain: Not on file  . Food insecurity:    Worry: Not on file    Inability: Not on file  . Transportation needs:    Medical: Not on file    Non-medical: Not on file  Tobacco Use  . Smoking status: Light Tobacco Smoker    Types: Cigarettes  . Smokeless tobacco: Current User  Substance and Sexual Activity  . Alcohol use: Not Currently  . Drug use: No  . Sexual activity: Yes    Partners: Male    Birth control/protection: Surgical  Lifestyle  . Physical activity:    Days per week: Not on file    Minutes per session: Not on file  . Stress: Not on file  Relationships  . Social connections:    Talks on phone: Not on file    Gets together: Not on file    Attends religious service: Not on file   Active member of club or organization: Not on file    Attends meetings of clubs or organizations: Not on file    Relationship status: Not on file  Other Topics Concern  . Not on file  Social History Narrative   Judith Drake grew up in [place]. She attended [employer] for a certificate in accounting. She currently lives a home with her husband and 2 boys. Her oldest son has autism. She enjoys going out with her family.    Allergies  Allergen Reactions  . Amoxicillin Nausea And Vomiting   Family History  Problem Relation Age of Onset  . Cancer Mother 2       Lung   . Hyperlipidemia Mother   . Hypertension Mother   . Stroke Mother   . Kidney disease Mother   . Diabetes Mother   . Heart disease Mother   . Hyperlipidemia Father   . Autism Son   . Cancer Paternal Grandfather 24       Colon Cancer - died  . Breast cancer Neg Hx      Past medical history, social, surgical and family history all reviewed in electronic medical record.  No pertanent information unless stated regarding to the  chief complaint.   Review of Systems:Review of systems updated and as accurate as of 02/15/18  No headache, visual changes, nausea, vomiting, diarrhea, constipation, dizziness, abdominal pain, skin rash, fevers, chills, night sweats, weight loss, swollen lymph nodes, body aches, joint swelling, , chest pain, shortness of breath, mood changes.  Positive muscle aches  Objective  Blood pressure (!) 150/100, pulse 89, height 5\' 3"  (1.6 m), weight 154 lb (69.9 kg), last menstrual period 02/08/2018, SpO2 95 %. Systems examined below as of 02/15/18   General: No apparent distress alert and oriented x3 mood and affect normal, dressed appropriately.  HEENT: Pupils equal, extraocular movements intact  Respiratory: Patient's speak in full sentences and does not appear short of breath  Cardiovascular: No lower extremity edema, non tender, no erythema  Skin: Warm dry intact with no signs of  infection or rash on extremities or on axial skeleton.  Abdomen: Soft nontender  Neuro: Cranial nerves II through XII are intact, neurovascularly intact in all extremities with 2+ DTRs and 2+ pulses.  Lymph: No lymphadenopathy of posterior or anterior cervical chain or axillae bilaterally.  Gait normal with good balance and coordination.  MSK:  Non tender with full range of motion and good stability and symmetric strength and tone of shoulders, elbows, wrist, hip, knee and ankles bilaterally.  Back Exam:  Inspection: Unremarkable  Motion: Flexion 20deg, Extension 25 deg, Side Bending to 35 deg bilaterally,  Rotation to 35 deg bilaterally  SLR laying: Mild positive on the right side XSLR laying: Negative  Palpable tenderness: Severe tenderness over the right sacroiliac joint.  Overlying lipoma noted. FABER: Positive Faber. Sensory change: Gross sensation intact to all lumbar and sacral dermatomes.  Reflexes: 2+ at both patellar tendons, 2+ at achilles tendons, Babinski's downgoing.  Strength at foot  Plantar-flexion: 5/5 Dorsi-flexion: 5/5 Eversion: 5/5 Inversion: 5/5  Leg strength  Quad: 5/5 Hamstring: 5/5 Hip flexor: 5/5 Hip abductors: 4/5 but symmetric Gait unremarkable.  Procedure: Real-time Ultrasound Guided Injection of right sacroiliac joint injected Device: GE Logiq Q7 Ultrasound guided injection is preferred based studies that show increased duration, increased effect, greater accuracy, decreased procedural pain, increased response rate, and decreased cost with ultrasound guided versus blind injection.  Verbal informed consent obtained.  Time-out conducted.  Noted no overlying erythema, induration, or other signs of local infection.  Skin prepped in a sterile fashion.  Local anesthesia: Topical Ethyl chloride.  With sterile technique and under real time ultrasound guidance: With a 21-gauge 2 inch needle patient was injected with 1 cc of 0.5% Marcaine and 1 cc of Kenalog 40 mg/mL  into the right sacroiliac joint Completed without difficulty  Pain immediately improved suggesting accurate placement of the medication.  Advised to call if fevers/chills, erythema, induration, drainage, or persistent bleeding.  Images permanently stored and available for review in the ultrasound unit.  Impression: Technically successful ultrasound guided injection.    Impression and Recommendations:     This case required medical decision making of moderate complexity.      Note: This dictation was prepared with Dragon dictation along with smaller phrase technology. Any transcriptional errors that result from this process are unintentional.

## 2018-02-15 ENCOUNTER — Encounter: Payer: Self-pay | Admitting: Family Medicine

## 2018-02-15 ENCOUNTER — Ambulatory Visit: Payer: BLUE CROSS/BLUE SHIELD | Admitting: Family Medicine

## 2018-02-15 ENCOUNTER — Other Ambulatory Visit (INDEPENDENT_AMBULATORY_CARE_PROVIDER_SITE_OTHER): Payer: BLUE CROSS/BLUE SHIELD

## 2018-02-15 ENCOUNTER — Ambulatory Visit: Payer: Self-pay

## 2018-02-15 VITALS — BP 150/100 | HR 89 | Ht 63.0 in | Wt 154.0 lb

## 2018-02-15 DIAGNOSIS — M2550 Pain in unspecified joint: Secondary | ICD-10-CM | POA: Diagnosis not present

## 2018-02-15 DIAGNOSIS — M47818 Spondylosis without myelopathy or radiculopathy, sacral and sacrococcygeal region: Secondary | ICD-10-CM | POA: Diagnosis not present

## 2018-02-15 DIAGNOSIS — M25551 Pain in right hip: Secondary | ICD-10-CM

## 2018-02-15 LAB — COMPREHENSIVE METABOLIC PANEL
ALBUMIN: 4.6 g/dL (ref 3.5–5.2)
ALT: 30 U/L (ref 0–35)
AST: 34 U/L (ref 0–37)
Alkaline Phosphatase: 72 U/L (ref 39–117)
BUN: 7 mg/dL (ref 6–23)
CALCIUM: 9.6 mg/dL (ref 8.4–10.5)
CO2: 29 mEq/L (ref 19–32)
CREATININE: 0.67 mg/dL (ref 0.40–1.20)
Chloride: 99 mEq/L (ref 96–112)
GFR: 100.81 mL/min (ref >60.00)
GLUCOSE: 101 mg/dL — AB (ref 70–99)
POTASSIUM: 3.5 meq/L (ref 3.5–5.1)
Sodium: 139 mEq/L (ref 135–145)
TOTAL PROTEIN: 7.7 g/dL (ref 6.0–8.3)
Total Bilirubin: 0.7 mg/dL (ref 0.2–1.2)

## 2018-02-15 LAB — IBC PANEL
Iron: 86 ug/dL (ref 42–145)
Saturation Ratios: 16.9 % — ABNORMAL LOW (ref 20.0–50.0)
Transferrin: 364 mg/dL — ABNORMAL HIGH (ref 212.0–360.0)

## 2018-02-15 LAB — VITAMIN D 25 HYDROXY (VIT D DEFICIENCY, FRACTURES): VITD: 16.6 ng/mL — AB (ref 30.00–100.00)

## 2018-02-15 LAB — CBC WITH DIFFERENTIAL/PLATELET
Basophils Absolute: 0.1 K/uL (ref 0.0–0.1)
Basophils Relative: 1 % (ref 0.0–3.0)
Eosinophils Absolute: 0.1 K/uL (ref 0.0–0.7)
Eosinophils Relative: 1.4 % (ref 0.0–5.0)
HCT: 38.8 % (ref 36.0–46.0)
Hemoglobin: 13.2 g/dL (ref 12.0–15.0)
Lymphocytes Relative: 36.7 % (ref 12.0–46.0)
Lymphs Abs: 2.5 K/uL (ref 0.7–4.0)
MCHC: 33.9 g/dL (ref 30.0–36.0)
MCV: 89.9 fl (ref 78.0–100.0)
Monocytes Absolute: 0.4 K/uL (ref 0.1–1.0)
Monocytes Relative: 6.4 % (ref 3.0–12.0)
Neutro Abs: 3.7 K/uL (ref 1.4–7.7)
Neutrophils Relative %: 54.5 % (ref 43.0–77.0)
Platelets: 262 K/uL (ref 150.0–400.0)
RBC: 4.32 Mil/uL (ref 3.87–5.11)
RDW: 27.9 % — ABNORMAL HIGH (ref 11.5–15.5)
WBC: 6.7 K/uL (ref 4.0–10.5)

## 2018-02-15 LAB — TSH: TSH: 1.88 u[IU]/mL (ref 0.35–4.50)

## 2018-02-15 LAB — URIC ACID: Uric Acid, Serum: 5.6 mg/dL (ref 2.4–7.0)

## 2018-02-15 LAB — SEDIMENTATION RATE: Sed Rate: 18 mm/h (ref 0–20)

## 2018-02-15 LAB — C-REACTIVE PROTEIN: CRP: 0.3 mg/dL — ABNORMAL LOW (ref 0.5–20.0)

## 2018-02-15 NOTE — Assessment & Plan Note (Signed)
Patient was given injection and tolerated procedure well.  Discussed icing regimen and home exercise.  Patient will come back and see me given to 3 weeks.  Patient continues to have a significant amount of tightness will need to consider further imaging including advanced imaging.

## 2018-02-15 NOTE — Patient Instructions (Addendum)
Good to see you  Ice I syour friend.  We will get labs  I injected the right SI joint today  OCntinue everything else See me again in 2-3 weeks and we will make sure you are doing well

## 2018-02-17 LAB — ANGIOTENSIN CONVERTING ENZYME: Angiotensin-Converting Enzyme: 11 U/L (ref 9–67)

## 2018-02-17 LAB — CYCLIC CITRUL PEPTIDE ANTIBODY, IGG: Cyclic Citrullin Peptide Ab: <16 UNITS

## 2018-02-17 LAB — ANA: ANA: NEGATIVE

## 2018-02-17 LAB — EXTRA LAV TOP TUBE

## 2018-02-17 LAB — RHEUMATOID FACTOR: RHEUMATOID FACTOR: 17 [IU]/mL — AB (ref <14)

## 2018-02-17 LAB — PTH, INTACT AND CALCIUM
Calcium: 9.7 mg/dL (ref 8.6–10.2)
PTH: 85 pg/mL — ABNORMAL HIGH (ref 14–64)

## 2018-02-17 LAB — CALCIUM, IONIZED: CALCIUM ION: 4.9 mg/dL (ref 4.8–5.6)

## 2018-02-17 LAB — HLA-B27 ANTIGEN: HLA-B27 ANTIGEN: NEGATIVE

## 2018-03-02 ENCOUNTER — Ambulatory Visit: Payer: BLUE CROSS/BLUE SHIELD | Admitting: Family Medicine

## 2018-03-07 ENCOUNTER — Encounter: Payer: Self-pay | Admitting: Family

## 2018-03-07 ENCOUNTER — Telehealth: Payer: Self-pay | Admitting: Family

## 2018-03-07 ENCOUNTER — Ambulatory Visit: Payer: BLUE CROSS/BLUE SHIELD | Admitting: Family

## 2018-03-07 VITALS — BP 160/118 | HR 83 | Temp 99.6°F | Wt 152.4 lb

## 2018-03-07 DIAGNOSIS — F1010 Alcohol abuse, uncomplicated: Secondary | ICD-10-CM

## 2018-03-07 DIAGNOSIS — F411 Generalized anxiety disorder: Secondary | ICD-10-CM | POA: Diagnosis not present

## 2018-03-07 DIAGNOSIS — Z23 Encounter for immunization: Secondary | ICD-10-CM | POA: Diagnosis not present

## 2018-03-07 DIAGNOSIS — E049 Nontoxic goiter, unspecified: Secondary | ICD-10-CM | POA: Diagnosis not present

## 2018-03-07 DIAGNOSIS — I10 Essential (primary) hypertension: Secondary | ICD-10-CM

## 2018-03-07 MED ORDER — BUSPIRONE HCL 10 MG PO TABS: 10.0000 mg | ORAL_TABLET | Freq: Three times a day (TID) | ORAL | 3 refills | Status: DC

## 2018-03-07 MED ORDER — AMLODIPINE BESYLATE 5 MG PO TABS: 5.0000 mg | ORAL_TABLET | Freq: Every day | ORAL | 3 refills | Status: DC

## 2018-03-07 NOTE — Patient Instructions (Addendum)
ultrasound of thyroid.   Increase buspar to 10mg  twice daily ( from 7.5mg ) and then if you dont get results, you can increase to 10mg  three times per day which is max.   Monitor blood pressure,  Goal is less than 130/80; if persistently higher, please make sooner follow up appointment so we can recheck you blood pressure and manage medications  Let me know if you find yourself loosing control with alcohol. I will help in any way.

## 2018-03-07 NOTE — Progress Notes (Signed)
Subjective:    Patient ID: Judith Drake, female    DOB: June 15, 1971, 47 y.o.   MRN: [id number]  CC: Judith Drake is a 47 y.o. female who presents today for follow up.   HPI: Anemia- still a 'little tired' but has improved.  On iron. Continues to have heavy menstrual cycles.  States plans to follow-up with OB.  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  H/o HTN- Had been on lisonpril /hctz in the past. At home varies,  From 120/80 to 150/90. No concern for OSA. No snoring.  No NSAIDs.Denies exertional chest pain or pressure, numbness or tingling radiating to left arm or jaw, palpitations, dizziness, frequent headaches, changes in vision, or shortness of breath.   GAD- compliant with buspar. Not really sure if can tell difference. 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 No si/hi.  Has drank a couple of times since last visit; note she had 3 drinks a couple of days ago; felt in control. Able to stop. No blacking out. No eye opening.No family members expressed annoyance. No following with AA  Has seen sports medicine  No trouble swallowing, hoarseness.    HISTORY:  Past Medical History:  Diagnosis Date  . Anemia   . Fibroid   . GERD (gastroesophageal reflux disease)   . Heavy menstrual period   . Hypertension    Past Surgical History:  Procedure Laterality Date  . ELBOW SURGERY     1997   . TUBAL LIGATION     Family History  Problem Relation Age of Onset  . Cancer Mother 23       Lung   . Hyperlipidemia Mother   . Hypertension Mother   . Stroke Mother   . Kidney disease Mother   . Diabetes Mother   . Heart disease Mother   . Hyperlipidemia Father   . Autism Son   . Cancer Paternal Grandfather 12       Colon Cancer - died  . Breast cancer Neg Hx     Allergies: Amoxicillin Current Outpatient Medications on File Prior to Visit  Medication Sig Dispense Refill  . cyanocobalamin 1000 MCG tablet Take 1 tablet (1,000 mcg total) by  mouth daily. 30 tablet 2  . ferrous sulfate 325 (65 FE) MG tablet Take 1 tablet (325 mg total) by mouth 2 (two) times daily with a meal. 60 tablet 2  . gabapentin (NEURONTIN) 100 MG capsule Take 2 capsules (200 mg total) by mouth at bedtime. (Patient not taking: Reported on 03/07/2018) 60 capsule 3   No current facility-administered medications on file prior to visit.     Social History   Tobacco Use  . Smoking status: Light Tobacco Smoker    Types: Cigarettes  . Smokeless tobacco: Current User  Substance Use Topics  . Alcohol use: Not Currently  . Drug use: No    Review of Systems  Constitutional: Negative for chills and fever.  HENT: Negative for trouble swallowing and voice change.   Respiratory: Negative for cough.   Cardiovascular: Negative for chest pain and palpitations.  Gastrointestinal: Negative for nausea and vomiting.  Genitourinary: Positive for vaginal bleeding.  Psychiatric/Behavioral: Negative for suicidal ideas. The patient is nervous/anxious.       Objective:    BP (!) 150/108 (BP Location: Left Arm, Patient Position: Sitting, Cuff Size: Normal)   Pulse 83   Temp 99.6 F (37.6 C) (Oral)   Wt 152 lb 6 oz (69.1  kg)   LMP 02/08/2018   SpO2 98%   BMI 26.99 kg/m  BP Readings from Last 3 Encounters:  03/07/18 (!) 150/108  02/15/18 (!) 150/100  02/08/18 (!) 144/80   Wt Readings from Last 3 Encounters:  03/07/18 152 lb 6 oz (69.1 kg)  02/15/18 154 lb (69.9 kg)  02/08/18 151 lb (68.5 kg)    Physical Exam  Constitutional: She appears well-developed and well-nourished.  Eyes: Conjunctivae are normal.  Neck: Thyromegaly present. No thyroid mass present.  On palpation, thyroid gland appears to be symmetrically enlarged.  Of note, this may be patient's body habitus.  Cardiovascular: Normal rate, regular rhythm, normal heart sounds and normal pulses.  Pulmonary/Chest: Effort normal and breath sounds normal. She has no wheezes. She has no rhonchi. She has no  rales.  Neurological: She is alert.  Skin: Skin is warm and dry.  Psychiatric: She has a normal mood and affect. Her speech is normal and behavior is normal. Thought content normal.  Vitals reviewed.      Assessment & Plan:   Problem List Items Addressed This Visit      Cardiovascular and Mediastinum   HTN (hypertension) - Primary    Elevated today.  Discussion with patient, we jointly agreed to go ahead and restart antihypertensive medication..  Amlodipine has been prescribed.  Patient to monitor and let me know how she doing from home.      Relevant Medications   amLODipine (NORVASC) 5 MG tablet     Endocrine   Enlarged thyroid    Asymptomatic.  Eventually enlarged thyroid.  Pending ultrasound.  Recent history of elevated TSH, normalized at this point.  Will follow      Relevant Orders   US THYROID     Other   Alcohol abuse    She has had a prescription since last visit.  She is adamant that she feels in control.  Offered support and advised that she follow with alcohol Anonymous.      GAD (generalized anxiety disorder)    Unchanged.  Will trial increase BuSpar.  Patient will let me know how she is doing.      Relevant Medications   busPIRone (BUSPAR) 10 MG tablet       I have discontinued [name]. Life's predniSONE and busPIRone. I am also having her start on amLODipine and busPIRone. Additionally, I am having her maintain her cyanocobalamin, ferrous sulfate, and gabapentin.   Meds ordered this encounter  Medications  . amLODipine (NORVASC) 5 MG tablet    Sig: Take 1 tablet (5 mg total) by mouth daily.    Dispense:  90 tablet    Refill:  3    Order Specific Question:   Supervising Provider    Answer:   Deborra Medina L [[id number]]  . busPIRone (BUSPAR) 10 MG tablet    Sig: Take 1 tablet (10 mg total) by mouth 3 (three) times daily.    Dispense:  90 tablet    Refill:  3    Order Specific Question:   Supervising Provider    Answer:   Crecencio Mc [[id number]]      Return precautions given.   Risks, benefits, and alternatives of the medications and treatment plan prescribed today were discussed, and patient expressed understanding.   Education regarding symptom management and diagnosis given to patient on AVS.  Continue to follow with Burnard Hawthorne, FNP for routine health maintenance.   Regan Lemming and I agreed with plan.  Mable Paris, FNP

## 2018-03-07 NOTE — Telephone Encounter (Signed)
Call pt  How is BP on amlodpine?  Will take a week to fully come down however we should start seeing a change.Marland Kitchen

## 2018-03-07 NOTE — Assessment & Plan Note (Signed)
She has had a prescription since last visit.  She is adamant that she feels in control.  Offered support and advised that she follow with alcohol Anonymous.

## 2018-03-07 NOTE — Assessment & Plan Note (Signed)
Asymptomatic.  Eventually enlarged thyroid.  Pending ultrasound.  Recent history of elevated TSH, normalized at this point.  Will follow

## 2018-03-07 NOTE — Addendum Note (Signed)
Addended by: Johna Sheriff on: 03/07/2018 02:59 PM   Modules accepted: Orders

## 2018-03-07 NOTE — Assessment & Plan Note (Signed)
Elevated today.  Discussion with patient, we jointly agreed to go ahead and restart antihypertensive medication..  Amlodipine has been prescribed.  Patient to monitor and let me know how she doing from home.

## 2018-03-07 NOTE — Assessment & Plan Note (Signed)
Unchanged.  Will trial increase BuSpar.  Patient will let me know how she is doing.

## 2018-03-09 NOTE — Telephone Encounter (Signed)
Patient called back said she has not taken her blood pressure today , , she will contact thru my chart as instructed once she checks

## 2018-03-09 NOTE — Telephone Encounter (Signed)
Left voice mail for patient to call back ok for P[facility] to speak to patient    

## 2018-03-14 ENCOUNTER — Encounter: Payer: Self-pay | Admitting: Family

## 2018-03-14 ENCOUNTER — Ambulatory Visit: Payer: BLUE CROSS/BLUE SHIELD

## 2018-03-14 NOTE — Telephone Encounter (Signed)
Still no mychart message  Please call pt and see how her BP is running

## 2018-03-14 NOTE — Telephone Encounter (Signed)
Left voice mail for patient to call back ok for P[facility] to speak to patient    

## 2018-03-16 NOTE — Telephone Encounter (Signed)
Patient sent blood pressure readings 03/15/18

## 2018-03-22 ENCOUNTER — Ambulatory Visit
Admission: RE | Admit: 2018-03-22 | Discharge: 2018-03-22 | Disposition: A | Discharge status: Home / Self Care | Payer: BLUE CROSS/BLUE SHIELD | Source: Ambulatory Visit | Attending: Family | Admitting: Family

## 2018-03-22 DIAGNOSIS — E049 Nontoxic goiter, unspecified: Secondary | ICD-10-CM

## 2018-03-22 DIAGNOSIS — E079 Disorder of thyroid, unspecified: Secondary | ICD-10-CM | POA: Diagnosis not present

## 2018-03-28 ENCOUNTER — Encounter: Payer: Self-pay | Admitting: Family Medicine

## 2018-03-28 ENCOUNTER — Encounter: Payer: Self-pay | Admitting: Family

## 2018-03-28 ENCOUNTER — Ambulatory Visit: Payer: BLUE CROSS/BLUE SHIELD | Admitting: Family Medicine

## 2018-03-28 VITALS — BP 126/82 | HR 97 | Ht 63.0 in | Wt 152.0 lb

## 2018-03-28 DIAGNOSIS — M999 Biomechanical lesion, unspecified: Secondary | ICD-10-CM

## 2018-03-28 DIAGNOSIS — M9902 Segmental and somatic dysfunction of thoracic region: Secondary | ICD-10-CM

## 2018-03-28 DIAGNOSIS — M47818 Spondylosis without myelopathy or radiculopathy, sacral and sacrococcygeal region: Secondary | ICD-10-CM | POA: Diagnosis not present

## 2018-03-28 DIAGNOSIS — M9901 Segmental and somatic dysfunction of cervical region: Secondary | ICD-10-CM | POA: Diagnosis not present

## 2018-03-28 DIAGNOSIS — M9903 Segmental and somatic dysfunction of lumbar region: Secondary | ICD-10-CM | POA: Diagnosis not present

## 2018-03-28 DIAGNOSIS — M9904 Segmental and somatic dysfunction of sacral region: Secondary | ICD-10-CM | POA: Diagnosis not present

## 2018-03-28 DIAGNOSIS — M9905 Segmental and somatic dysfunction of pelvic region: Secondary | ICD-10-CM

## 2018-03-28 NOTE — Patient Instructions (Signed)
Good to see you  Ice is your friend Continue the exercises Tried manipulation  See me again in 4 weeks

## 2018-03-28 NOTE — Progress Notes (Signed)
[facility] [address] [place], [place] [address] Phone: [phone] Subjective:     CC: Back pain  ZWC:HENIDPOEUM  Judith Drake is a 47 y.o. female coming in with complaint of right hip pain over the piriformis. Injected SI joint last visit. Is having radiating pain in the antierior thigh of right leg. Pain occurs with lying supine or on that side. Patient states that her pain started to increase over the past week. Injection did provide about 3 weeks of relief.  Patient states that she was feeling 100% better.  Started worsening some discomfort again.  Not as much from radiation but sometimes to the anterior thigh.  Patient previous x-rays lumbar spine show some mild degenerative disc disease at L3-L4     Past Medical History:  Diagnosis Date  . Anemia   . Fibroid   . GERD (gastroesophageal reflux disease)   . Heavy menstrual period   . Hypertension    Past Surgical History:  Procedure Laterality Date  . ELBOW SURGERY     1997   . TUBAL LIGATION     Social History   Socioeconomic History  . Marital status: Married    Spouse name: Judith Drake  . Number of children: 2  . Years of education: 89  . Highest education level: Not on file  Occupational History  . Occupation: Agricultural engineer   Social Needs  . Financial resource strain: Not on file  . Food insecurity:    Worry: Not on file    Inability: Not on file  . Transportation needs:    Medical: Not on file    Non-medical: Not on file  Tobacco Use  . Smoking status: Light Tobacco Smoker    Types: Cigarettes  . Smokeless tobacco: Current User  Substance and Sexual Activity  . Alcohol use: Not Currently  . Drug use: No  . Sexual activity: Yes    Partners: Male    Birth control/protection: Surgical  Lifestyle  . Physical activity:    Days per week: Not on file    Minutes per session: Not on file  . Stress: Not on file  Relationships  . Social connections:    Talks on phone: Not on  file    Gets together: Not on file    Attends religious service: Not on file    Active member of club or organization: Not on file    Attends meetings of clubs or organizations: Not on file    Relationship status: Not on file  Other Topics Concern  . Not on file  Social History Narrative   Judith Drake grew up in [place]. She attended [employer] for a certificate in accounting. She currently lives a home with her husband and 2 boys. Her oldest son has autism. She enjoys going out with her family.    Allergies  Allergen Reactions  . Amoxicillin Nausea And Vomiting   Family History  Problem Relation Age of Onset  . Cancer Mother 63       Lung   . Hyperlipidemia Mother   . Hypertension Mother   . Stroke Mother   . Kidney disease Mother   . Diabetes Mother   . Heart disease Mother   . Hyperlipidemia Father   . Autism Son   . Cancer Paternal Grandfather 68       Colon Cancer - died  . Breast cancer Neg Hx      Past medical history, social, surgical and family history  all reviewed in electronic medical record.  No pertanent information unless stated regarding to the chief complaint.   Review of Systems:Review of systems updated and as accurate as of 03/28/18  No headache, visual changes, nausea, vomiting, diarrhea, constipation, dizziness, abdominal pain, skin rash, fevers, chills, night sweats, weight loss, swollen lymph nodes, body aches, joint swelling, muscle aches, chest pain, shortness of breath, mood changes.  Mild positive muscle aches  Objective  Blood pressure 126/82, pulse 97, height 5\' 3"  (1.6 m), weight 152 lb (68.9 kg), SpO2 98 %. Systems examined below as of 03/28/18   General: No apparent distress alert and oriented x3 mood and affect normal, dressed appropriately.  HEENT: Pupils equal, extraocular movements intact  Respiratory: Patient's speak in full sentences and does not appear short of breath  Cardiovascular: No lower extremity edema, non tender,  no erythema  Skin: Warm dry intact with no signs of infection or rash on extremities or on axial skeleton.  Abdomen: Soft nontender  Neuro: Cranial nerves II through XII are intact, neurovascularly intact in all extremities with 2+ DTRs and 2+ pulses.  Lymph: No lymphadenopathy of posterior or anterior cervical chain or axillae bilaterally.  Gait normal with good balance and coordination.  MSK:  Non tender with full range of motion and good stability and symmetric strength and tone of shoulders, elbows, wrist, hip, knee and ankles bilaterally.  Back Exam:  Inspection: Loss of lordosis Motion: Flexion 30 deg, Extension 25 deg, Side Bending to 35 deg bilaterally,  Rotation to 30 deg bilaterally  SLR laying: Negative  XSLR laying: Negative  Palpable tenderness: Tender to palpation the paraspinal musculature lumbar spine right greater than left mostly over the right sacroiliac joint. FABER: Positive Faber on the right. Sensory change: Gross sensation intact to all lumbar and sacral dermatomes.  Reflexes: 2+ at both patellar tendons, 2+ at achilles tendons, Babinski's downgoing.  Strength at foot  Plantar-flexion: 5/5 Dorsi-flexion: 5/5 Eversion: 5/5 Inversion: 5/5  Leg strength  Quad: 5/5 Hamstring: 5/5 Hip flexor: 5/5 Hip abductors: 4/5 but symmetric Gait unremarkable.  Osteopathic findings C4 flexed rotated and side bent left C7 flexed rotated and side bent right T9 extended rotated and side bent left L2 flexed rotated and side bent right Sacrum right on right Pelvic shear noted    Impression and Recommendations:     This case required medical decision making of moderate complexity.      Note: This dictation was prepared with Dragon dictation along with smaller phrase technology. Any transcriptional errors that result from this process are unintentional.

## 2018-03-28 NOTE — Assessment & Plan Note (Signed)
Patient did respond well to the injections previously.  Hopefully the patient will do well with the home exercises and we attempted osteopathic manipulation.  We discussed the potential for PRP.  We discussed icing regimen, we discussed which activities of doing which wants to avoid.  Patient is to follow-up with me again in 4 to 8 weeks

## 2018-03-28 NOTE — Assessment & Plan Note (Signed)
Decision today to treat with OMT was based on Physical Exam  After verbal consent patient was treated with HVLA, ME, FPR techniques in cervical, thoracic, lumbar and sacral pelvis areas  Patient tolerated the procedure well with improvement in symptoms  Patient given exercises, stretches and lifestyle modifications  See medications in patient instructions if given  Patient will follow up in 4-8 weeks

## 2018-04-09 ENCOUNTER — Emergency Department: Payer: BLUE CROSS/BLUE SHIELD

## 2018-04-09 ENCOUNTER — Other Ambulatory Visit: Payer: Self-pay

## 2018-04-09 ENCOUNTER — Inpatient Hospital Stay
Admission: EM | Admit: 2018-04-09 | Discharge: 2018-04-13 | DRG: 208 | Disposition: A | Discharge status: Home / Self Care | Payer: BLUE CROSS/BLUE SHIELD | Attending: Internal Medicine | Admitting: Internal Medicine

## 2018-04-09 DIAGNOSIS — S299XXA Unspecified injury of thorax, initial encounter: Secondary | ICD-10-CM | POA: Diagnosis not present

## 2018-04-09 DIAGNOSIS — S0101XA Laceration without foreign body of scalp, initial encounter: Secondary | ICD-10-CM

## 2018-04-09 DIAGNOSIS — I10 Essential (primary) hypertension: Secondary | ICD-10-CM | POA: Diagnosis present

## 2018-04-09 DIAGNOSIS — F1010 Alcohol abuse, uncomplicated: Secondary | ICD-10-CM | POA: Diagnosis present

## 2018-04-09 DIAGNOSIS — F10129 Alcohol abuse with intoxication, unspecified: Secondary | ICD-10-CM | POA: Diagnosis present

## 2018-04-09 DIAGNOSIS — E876 Hypokalemia: Secondary | ICD-10-CM

## 2018-04-09 DIAGNOSIS — Y908 Blood alcohol level of 240 mg/100 ml or more: Secondary | ICD-10-CM | POA: Diagnosis present

## 2018-04-09 DIAGNOSIS — G92 Toxic encephalopathy: Secondary | ICD-10-CM | POA: Diagnosis present

## 2018-04-09 DIAGNOSIS — K219 Gastro-esophageal reflux disease without esophagitis: Secondary | ICD-10-CM | POA: Diagnosis present

## 2018-04-09 DIAGNOSIS — J690 Pneumonitis due to inhalation of food and vomit: Secondary | ICD-10-CM | POA: Diagnosis present

## 2018-04-09 DIAGNOSIS — F17210 Nicotine dependence, cigarettes, uncomplicated: Secondary | ICD-10-CM | POA: Diagnosis present

## 2018-04-09 DIAGNOSIS — Z881 Allergy status to other antibiotic agents status: Secondary | ICD-10-CM

## 2018-04-09 DIAGNOSIS — F10120 Alcohol abuse with intoxication, uncomplicated: Secondary | ICD-10-CM | POA: Diagnosis not present

## 2018-04-09 DIAGNOSIS — T7691XA Unspecified adult maltreatment, suspected, initial encounter: Secondary | ICD-10-CM | POA: Diagnosis present

## 2018-04-09 DIAGNOSIS — F419 Anxiety disorder, unspecified: Secondary | ICD-10-CM | POA: Diagnosis present

## 2018-04-09 DIAGNOSIS — T4275XA Adverse effect of unspecified antiepileptic and sedative-hypnotic drugs, initial encounter: Secondary | ICD-10-CM | POA: Diagnosis present

## 2018-04-09 DIAGNOSIS — S3991XA Unspecified injury of abdomen, initial encounter: Secondary | ICD-10-CM | POA: Diagnosis not present

## 2018-04-09 DIAGNOSIS — S0990XA Unspecified injury of head, initial encounter: Secondary | ICD-10-CM | POA: Diagnosis not present

## 2018-04-09 DIAGNOSIS — S199XXA Unspecified injury of neck, initial encounter: Secondary | ICD-10-CM | POA: Diagnosis not present

## 2018-04-09 DIAGNOSIS — F1090 Alcohol use, unspecified, uncomplicated: Secondary | ICD-10-CM | POA: Diagnosis present

## 2018-04-09 DIAGNOSIS — J9601 Acute respiratory failure with hypoxia: Secondary | ICD-10-CM | POA: Diagnosis not present

## 2018-04-09 DIAGNOSIS — F10929 Alcohol use, unspecified with intoxication, unspecified: Secondary | ICD-10-CM

## 2018-04-09 DIAGNOSIS — I952 Hypotension due to drugs: Secondary | ICD-10-CM | POA: Diagnosis present

## 2018-04-09 DIAGNOSIS — R000 Tachycardia, unspecified: Secondary | ICD-10-CM | POA: Diagnosis present

## 2018-04-09 DIAGNOSIS — Z79899 Other long term (current) drug therapy: Secondary | ICD-10-CM

## 2018-04-09 DIAGNOSIS — R740 Nonspecific elevation of levels of transaminase and lactic acid dehydrogenase [LDH]: Secondary | ICD-10-CM | POA: Diagnosis present

## 2018-04-09 HISTORY — DX: Alcohol abuse, uncomplicated: F10.10

## 2018-04-09 LAB — URINALYSIS, COMPLETE (UACMP) WITH MICROSCOPIC
BILIRUBIN URINE: NEGATIVE
Glucose, UA: NEGATIVE mg/dL
Ketones, ur: NEGATIVE mg/dL
Leukocytes, UA: NEGATIVE
Nitrite: NEGATIVE
PROTEIN: 30 mg/dL — AB
Specific Gravity, Urine: 1.002 — ABNORMAL LOW (ref 1.005–1.030)
WBC UA: NONE SEEN WBC/hpf (ref 0–5)
pH: 7 (ref 5.0–8.0)

## 2018-04-09 LAB — CBC WITH DIFFERENTIAL/PLATELET
Basophils Absolute: 0.1 10*3/uL (ref 0–0.1)
Basophils Relative: 1 %
EOS ABS: 0.1 10*3/uL (ref 0–0.7)
Eosinophils Relative: 1 %
HEMATOCRIT: 36.9 % (ref 35.0–47.0)
HEMOGLOBIN: 13.1 g/dL (ref 12.0–16.0)
LYMPHS ABS: 3.1 10*3/uL (ref 1.0–3.6)
Lymphocytes Relative: 25 %
MCH: 36 pg — AB (ref 26.0–34.0)
MCHC: 35.4 g/dL (ref 32.0–36.0)
MCV: 101.8 fL — ABNORMAL HIGH (ref 80.0–100.0)
Monocytes Absolute: 0.5 10*3/uL (ref 0.2–0.9)
Monocytes Relative: 5 %
NEUTROS ABS: 8.5 10*3/uL — AB (ref 1.4–6.5)
NEUTROS PCT: 68 %
Platelets: 339 10*3/uL (ref 150–440)
RBC: 3.62 MIL/uL — AB (ref 3.80–5.20)
RDW: 16.4 % — ABNORMAL HIGH (ref 11.5–14.5)
WBC: 12.3 10*3/uL — AB (ref 3.6–11.0)

## 2018-04-09 IMAGING — DX DG CHEST 1V PORT
1 series · 1 of 1 positions shown · non-contrast
Comparison: [DATE]

CLINICAL DATA: Status post intubation.

EXAM:
PORTABLE CHEST 1 VIEW

[chest ap]
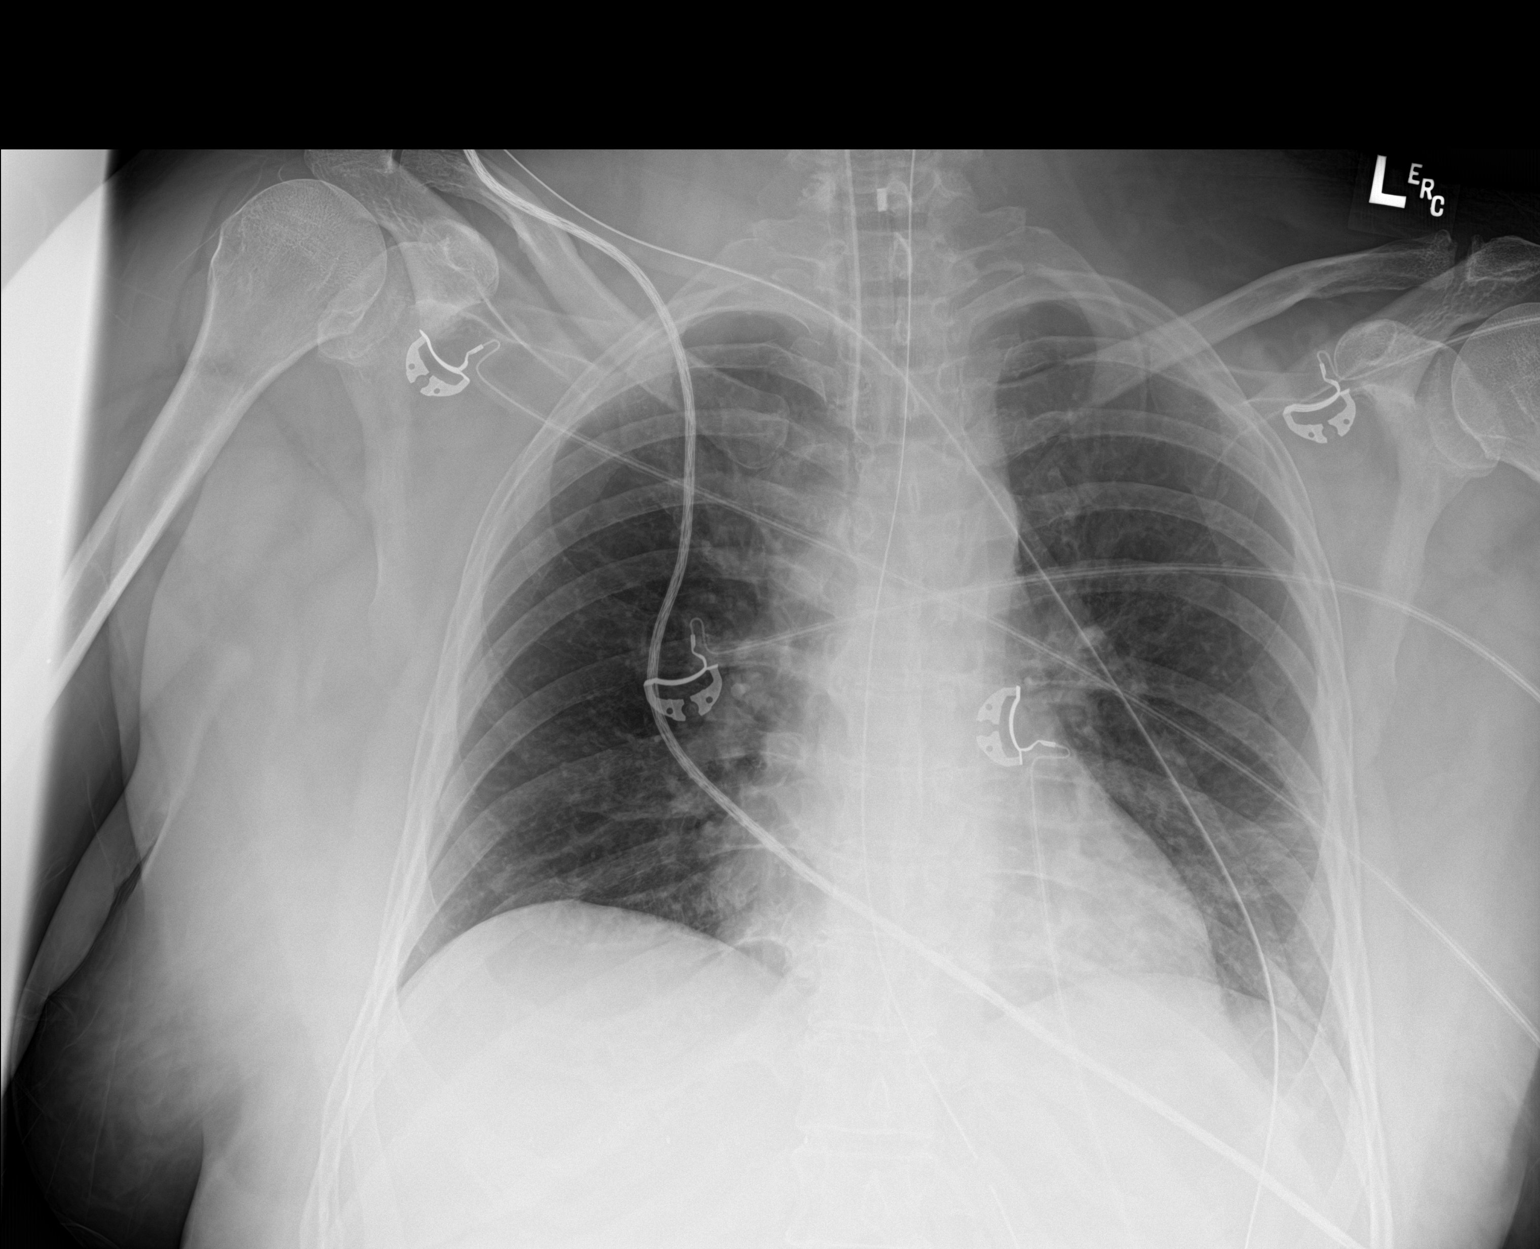

[1 of 1 positions shown; findings below may reference images not displayed]

FINDINGS: [DT] hours. Endotracheal tube tip is 3.7 cm above the base of the
carina. The NG tube passes into the stomach although the distal tip
position is not included on the film. The lungs are clear without
focal pneumonia, edema, pneumothorax or pleural effusion.
Atelectasis noted left base. The cardiopericardial silhouette is
within normal limits for size. The visualized bony structures of the
thorax are intact.
IMPRESSION: 1. Endotracheal tube tip 3.7 cm above the carina.
2. Left base atelectasis.

## 2018-04-09 MED ORDER — KETAMINE HCL 50 MG/ML IJ SOLN
300.0000 mg | Freq: Once | INTRAMUSCULAR | Status: AC
  Administered 2018-04-09: 300 mg via INTRAMUSCULAR

## 2018-04-09 MED ORDER — TETANUS-DIPHTH-ACELL PERTUSSIS 5-2.5-18.5 LF-MCG/0.5 IM SUSP
0.5000 mL | Freq: Once | INTRAMUSCULAR | Status: AC
  Administered 2018-04-10: 0.5 mL via INTRAMUSCULAR
  Filled 2018-04-09: qty 0.5

## 2018-04-09 MED ORDER — FENTANYL 2500MCG IN NS 250ML (10MCG/ML) PREMIX INFUSION
0.0000 ug/h | INTRAVENOUS | Status: DC
  Administered 2018-04-10 (×4): 100 ug/h via INTRAVENOUS
  Filled 2018-04-09 (×2): qty 250

## 2018-04-09 MED ORDER — HYDROMORPHONE HCL 1 MG/ML IJ SOLN
1.0000 mg | Freq: Once | INTRAMUSCULAR | Status: AC
  Administered 2018-04-10: 1 mg via INTRAVENOUS
  Filled 2018-04-09: qty 1

## 2018-04-09 MED ORDER — KETAMINE HCL 50 MG/ML IJ SOLN
INTRAMUSCULAR | Status: AC
  Administered 2018-04-09: 300 mg via INTRAMUSCULAR
  Filled 2018-04-09: qty 10

## 2018-04-09 MED ORDER — LORAZEPAM 2 MG/ML IJ SOLN
INTRAMUSCULAR | Status: AC
  Administered 2018-04-09: 1 mg via INTRAVENOUS
  Filled 2018-04-09: qty 1

## 2018-04-09 MED ORDER — SUCCINYLCHOLINE CHLORIDE 20 MG/ML IJ SOLN
140.0000 mg | Freq: Once | INTRAMUSCULAR | Status: AC
Start: 2018-04-09 — End: 2018-04-09
  Administered 2018-04-09: 140 mg via INTRAVENOUS

## 2018-04-09 MED ORDER — LIDOCAINE-EPINEPHRINE 1 %-1:100000 IJ SOLN
10.0000 mL | Freq: Once | INTRAMUSCULAR | Status: DC
  Filled 2018-04-09: qty 10

## 2018-04-09 MED ORDER — SODIUM CHLORIDE 0.9 % IV BOLUS
1000.0000 mL | Freq: Once | INTRAVENOUS | Status: AC
  Administered 2018-04-09: 1000 mL via INTRAVENOUS

## 2018-04-09 MED ORDER — LORAZEPAM 2 MG/ML IJ SOLN
1.0000 mg | Freq: Once | INTRAMUSCULAR | Status: AC
  Administered 2018-04-09: 1 mg via INTRAVENOUS

## 2018-04-09 NOTE — ED Notes (Signed)
Patient transported to CT with this RN 

## 2018-04-09 NOTE — ED Triage Notes (Signed)
Reports patient dizzy and fell and hit head on cement steps.
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
## 2018-04-10 ENCOUNTER — Emergency Department: Payer: BLUE CROSS/BLUE SHIELD

## 2018-04-10 ENCOUNTER — Other Ambulatory Visit: Payer: Self-pay | Admitting: Family

## 2018-04-10 ENCOUNTER — Encounter: Payer: Self-pay | Admitting: Radiology

## 2018-04-10 DIAGNOSIS — S0101XA Laceration without foreign body of scalp, initial encounter: Secondary | ICD-10-CM | POA: Diagnosis present

## 2018-04-10 DIAGNOSIS — S299XXA Unspecified injury of thorax, initial encounter: Secondary | ICD-10-CM | POA: Diagnosis not present

## 2018-04-10 DIAGNOSIS — T4275XA Adverse effect of unspecified antiepileptic and sedative-hypnotic drugs, initial encounter: Secondary | ICD-10-CM | POA: Diagnosis present

## 2018-04-10 DIAGNOSIS — F419 Anxiety disorder, unspecified: Secondary | ICD-10-CM | POA: Diagnosis present

## 2018-04-10 DIAGNOSIS — K219 Gastro-esophageal reflux disease without esophagitis: Secondary | ICD-10-CM | POA: Diagnosis present

## 2018-04-10 DIAGNOSIS — R000 Tachycardia, unspecified: Secondary | ICD-10-CM | POA: Diagnosis not present

## 2018-04-10 DIAGNOSIS — I10 Essential (primary) hypertension: Secondary | ICD-10-CM | POA: Diagnosis present

## 2018-04-10 DIAGNOSIS — J9601 Acute respiratory failure with hypoxia: Secondary | ICD-10-CM | POA: Diagnosis present

## 2018-04-10 DIAGNOSIS — F17210 Nicotine dependence, cigarettes, uncomplicated: Secondary | ICD-10-CM | POA: Diagnosis present

## 2018-04-10 DIAGNOSIS — S3991XA Unspecified injury of abdomen, initial encounter: Secondary | ICD-10-CM | POA: Diagnosis not present

## 2018-04-10 DIAGNOSIS — E876 Hypokalemia: Secondary | ICD-10-CM | POA: Diagnosis not present

## 2018-04-10 DIAGNOSIS — I952 Hypotension due to drugs: Secondary | ICD-10-CM | POA: Diagnosis present

## 2018-04-10 DIAGNOSIS — F10129 Alcohol abuse with intoxication, unspecified: Secondary | ICD-10-CM | POA: Diagnosis present

## 2018-04-10 DIAGNOSIS — G92 Toxic encephalopathy: Secondary | ICD-10-CM | POA: Diagnosis present

## 2018-04-10 DIAGNOSIS — Z79899 Other long term (current) drug therapy: Secondary | ICD-10-CM | POA: Diagnosis not present

## 2018-04-10 DIAGNOSIS — T7691XA Unspecified adult maltreatment, suspected, initial encounter: Secondary | ICD-10-CM | POA: Diagnosis present

## 2018-04-10 DIAGNOSIS — S199XXA Unspecified injury of neck, initial encounter: Secondary | ICD-10-CM | POA: Diagnosis not present

## 2018-04-10 DIAGNOSIS — J690 Pneumonitis due to inhalation of food and vomit: Secondary | ICD-10-CM | POA: Diagnosis present

## 2018-04-10 DIAGNOSIS — S0990XA Unspecified injury of head, initial encounter: Secondary | ICD-10-CM | POA: Diagnosis not present

## 2018-04-10 DIAGNOSIS — Y908 Blood alcohol level of 240 mg/100 ml or more: Secondary | ICD-10-CM | POA: Diagnosis present

## 2018-04-10 DIAGNOSIS — Z881 Allergy status to other antibiotic agents status: Secondary | ICD-10-CM | POA: Diagnosis not present

## 2018-04-10 DIAGNOSIS — R740 Nonspecific elevation of levels of transaminase and lactic acid dehydrogenase [LDH]: Secondary | ICD-10-CM | POA: Diagnosis present

## 2018-04-10 DIAGNOSIS — R52 Pain, unspecified: Secondary | ICD-10-CM | POA: Diagnosis not present

## 2018-04-10 DIAGNOSIS — F1010 Alcohol abuse, uncomplicated: Secondary | ICD-10-CM | POA: Diagnosis not present

## 2018-04-10 HISTORY — DX: Acute respiratory failure with hypoxia: J96.01

## 2018-04-10 LAB — BLOOD GAS, ARTERIAL
ACID-BASE DEFICIT: 2.5 mmol/L — AB (ref 0.0–2.0)
Bicarbonate: 22.5 mmol/L (ref 20.0–28.0)
FIO2: 0.4
LHR: 14 {breaths}/min
O2 Saturation: 99 %
PCO2 ART: 39 mmHg (ref 32.0–48.0)
PH ART: 7.37 (ref 7.350–7.450)
Patient temperature: 37
VT: 450 mL
pO2, Arterial: 136 mmHg — ABNORMAL HIGH (ref 83.0–108.0)

## 2018-04-10 LAB — CBC WITH DIFFERENTIAL/PLATELET
BASOS ABS: 0.1 10*3/uL (ref 0–0.1)
Basophils Relative: 1 %
EOS PCT: 1 %
Eosinophils Absolute: 0 10*3/uL (ref 0–0.7)
HEMATOCRIT: 30.1 % — AB (ref 35.0–47.0)
Hemoglobin: 10.4 g/dL — ABNORMAL LOW (ref 12.0–16.0)
LYMPHS PCT: 33 %
Lymphs Abs: 3.1 10*3/uL (ref 1.0–3.6)
MCH: 36 pg — ABNORMAL HIGH (ref 26.0–34.0)
MCHC: 34.7 g/dL (ref 32.0–36.0)
MCV: 103.7 fL — AB (ref 80.0–100.0)
MONO ABS: 0.4 10*3/uL (ref 0.2–0.9)
MONOS PCT: 5 %
Neutro Abs: 5.8 10*3/uL (ref 1.4–6.5)
Neutrophils Relative %: 60 %
Platelets: 295 10*3/uL (ref 150–440)
RBC: 2.91 MIL/uL — ABNORMAL LOW (ref 3.80–5.20)
RDW: 16.9 % — AB (ref 11.5–14.5)
WBC: 9.4 10*3/uL (ref 3.6–11.0)

## 2018-04-10 LAB — COMPREHENSIVE METABOLIC PANEL
ALK PHOS: 84 U/L (ref 38–126)
ALT: 42 U/L (ref 0–44)
AST: 64 U/L — ABNORMAL HIGH (ref 15–41)
Albumin: 4.5 g/dL (ref 3.5–5.0)
Anion gap: 12 (ref 5–15)
BUN: 6 mg/dL (ref 6–20)
CALCIUM: 8.1 mg/dL — AB (ref 8.9–10.3)
CO2: 24 mmol/L (ref 22–32)
CREATININE: 0.63 mg/dL (ref 0.44–1.00)
Chloride: 99 mmol/L (ref 98–111)
Glucose, Bld: 102 mg/dL — ABNORMAL HIGH (ref 70–99)
Potassium: 2.4 mmol/L — CL (ref 3.5–5.1)
SODIUM: 135 mmol/L (ref 135–145)
Total Bilirubin: 0.5 mg/dL (ref 0.3–1.2)
Total Protein: 7.8 g/dL (ref 6.5–8.1)

## 2018-04-10 LAB — BASIC METABOLIC PANEL
Anion gap: 9 (ref 5–15)
Anion gap: 9 (ref 5–15)
CALCIUM: 6.9 mg/dL — AB (ref 8.9–10.3)
CHLORIDE: 113 mmol/L — AB (ref 98–111)
CO2: 21 mmol/L — ABNORMAL LOW (ref 22–32)
CO2: 23 mmol/L (ref 22–32)
Calcium: 7.5 mg/dL — ABNORMAL LOW (ref 8.9–10.3)
Chloride: 114 mmol/L — ABNORMAL HIGH (ref 98–111)
Creatinine, Ser: 0.46 mg/dL (ref 0.44–1.00)
Creatinine, Ser: 0.51 mg/dL (ref 0.44–1.00)
GFR calc Af Amer: >60 mL/min (ref >60)
GFR calc Af Amer: >60 mL/min (ref >60)
GFR calc non Af Amer: >60 mL/min (ref >60)
GLUCOSE: 84 mg/dL (ref 70–99)
GLUCOSE: 91 mg/dL (ref 70–99)
POTASSIUM: 3.5 mmol/L (ref 3.5–5.1)
POTASSIUM: 3.9 mmol/L (ref 3.5–5.1)
Sodium: 144 mmol/L (ref 135–145)
Sodium: 145 mmol/L (ref 135–145)

## 2018-04-10 LAB — CBC
HEMATOCRIT: 33.4 % — AB (ref 35.0–47.0)
Hemoglobin: 11.5 g/dL — ABNORMAL LOW (ref 12.0–16.0)
MCH: 35.8 pg — AB (ref 26.0–34.0)
MCHC: 34.5 g/dL (ref 32.0–36.0)
MCV: 103.8 fL — AB (ref 80.0–100.0)
Platelets: 268 10*3/uL (ref 150–440)
RBC: 3.22 MIL/uL — ABNORMAL LOW (ref 3.80–5.20)
RDW: 16.8 % — ABNORMAL HIGH (ref 11.5–14.5)
WBC: 9.7 10*3/uL (ref 3.6–11.0)

## 2018-04-10 LAB — HCG, QUANTITATIVE, PREGNANCY: hCG, Beta Chain, Quant, S: <1 m[IU]/mL (ref <5)

## 2018-04-10 LAB — TRIGLYCERIDES: Triglycerides: 354 mg/dL — ABNORMAL HIGH (ref <150)

## 2018-04-10 LAB — URINE DRUG SCREEN, QUALITATIVE (ARMC ONLY)
AMPHETAMINES, UR SCREEN: NOT DETECTED
Barbiturates, Ur Screen: NOT DETECTED
COCAINE METABOLITE, UR ~~LOC~~: NOT DETECTED
Cannabinoid 50 Ng, Ur ~~LOC~~: NOT DETECTED
MDMA (Ecstasy)Ur Screen: NOT DETECTED
Methadone Scn, Ur: NOT DETECTED
OPIATE, UR SCREEN: NOT DETECTED
Phencyclidine (PCP) Ur S: NOT DETECTED
Tricyclic, Ur Screen: NOT DETECTED

## 2018-04-10 LAB — ETHANOL
ALCOHOL ETHYL (B): 428 mg/dL — AB (ref <10)
ALCOHOL ETHYL (B): 197 mg/dL — AB (ref <10)

## 2018-04-10 LAB — GLUCOSE, CAPILLARY
GLUCOSE-CAPILLARY: 75 mg/dL (ref 70–99)
Glucose-Capillary: 71 mg/dL (ref 70–99)
Glucose-Capillary: 76 mg/dL (ref 70–99)
Glucose-Capillary: 81 mg/dL (ref 70–99)
Glucose-Capillary: 84 mg/dL (ref 70–99)

## 2018-04-10 LAB — MAGNESIUM: Magnesium: 1.9 mg/dL (ref 1.7–2.4)

## 2018-04-10 LAB — SALICYLATE LEVEL

## 2018-04-10 LAB — MRSA PCR SCREENING: MRSA by PCR: NEGATIVE

## 2018-04-10 LAB — ACETAMINOPHEN LEVEL

## 2018-04-10 IMAGING — CT CT CERVICAL SPINE W/O CM
3 of 4 series · 12 of 33 positions shown, 14 images · non-contrast
Comparison: Head CT [DATE]

CLINICAL DATA: Fall, back of head hematoma.  ETOH.

EXAM:
CT HEAD WITHOUT CONTRAST
CT CERVICAL SPINE WITHOUT CONTRAST
TECHNIQUE: Multidetector CT imaging of the head and cervical spine was
performed following the standard protocol without intravenous
contrast. Multiplanar CT image reconstructions of the cervical spine
were also generated.

[Series 7: sagittal bone · sagittal · 0.23mm/px · 5 of 56 slices shown, 6 images]
[im 19/56  bone]
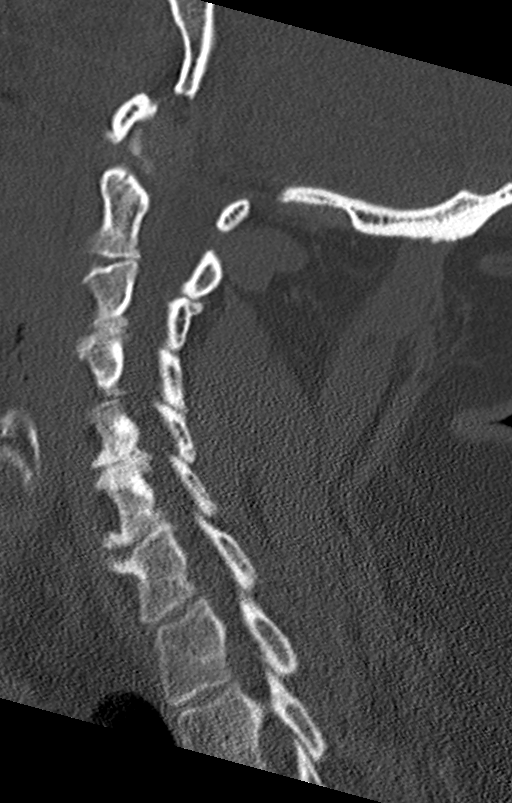
[im 23/56  bone]
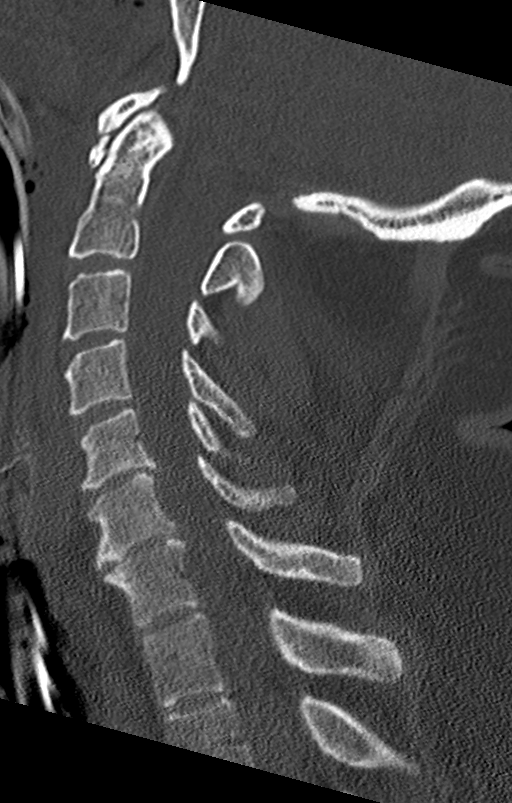
[im 28/56  soft-tissue]
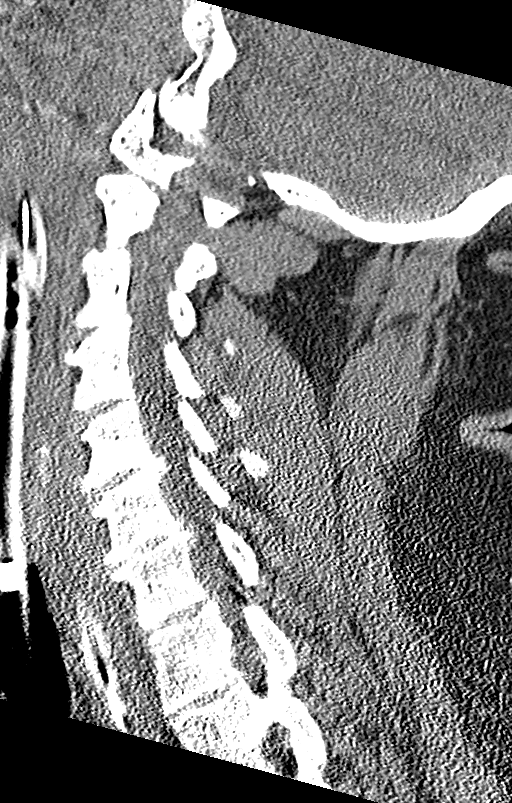
[im 28/56  bone]
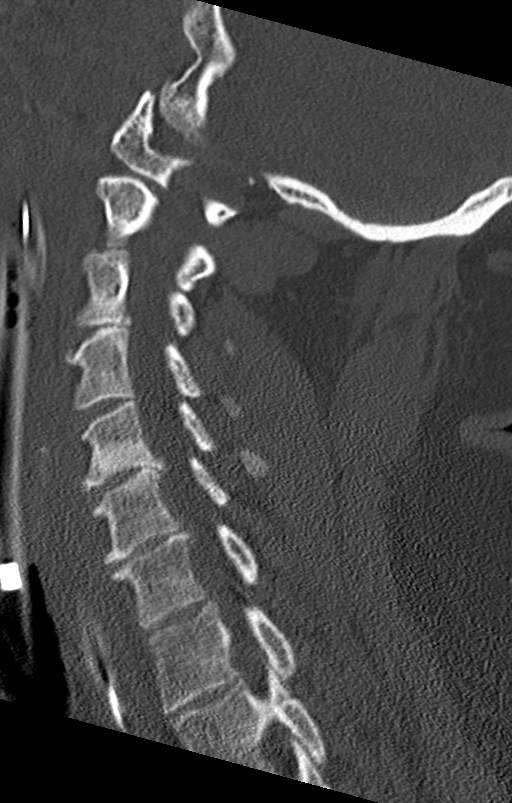
[im 33/56  bone]
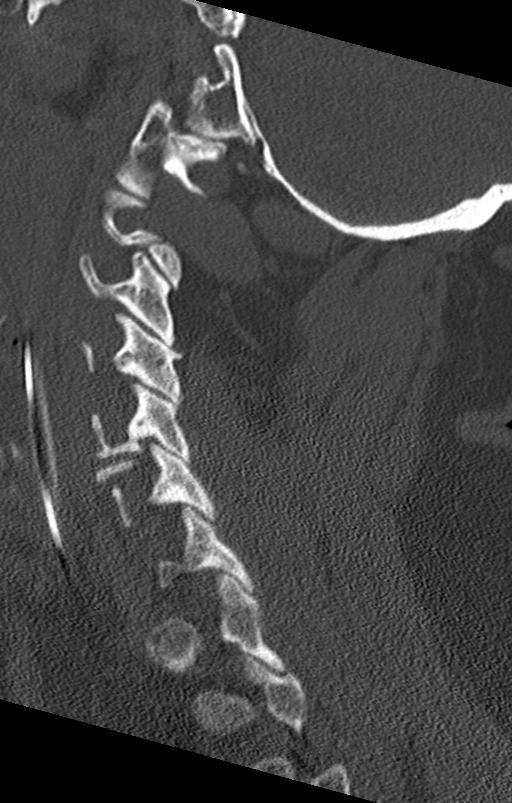
[im 37/56  bone]
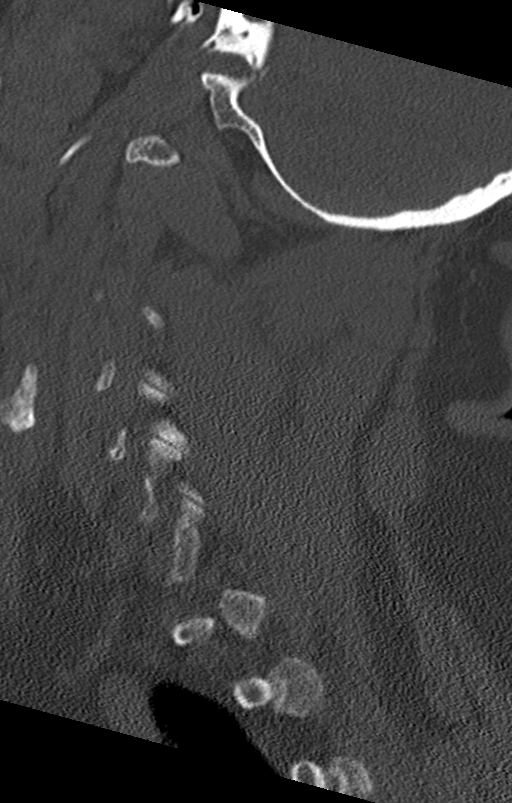

[Series 8: coronal bone · coronal · 0.27mm/px · 3 of 57 slices shown]
[im 12/57  bone]
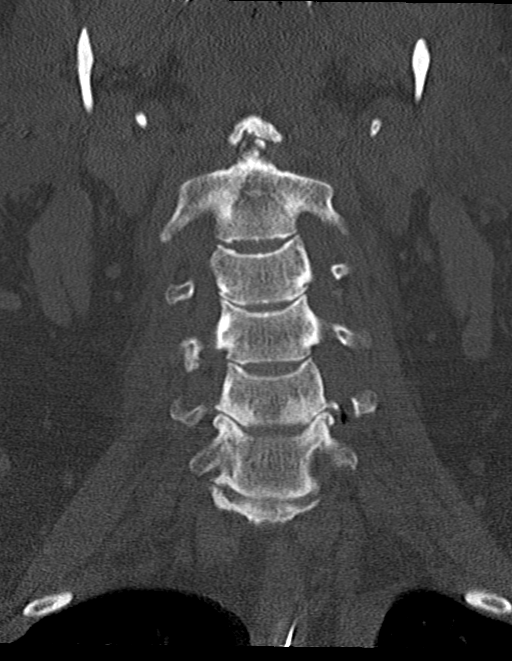
[im 23/57  bone]
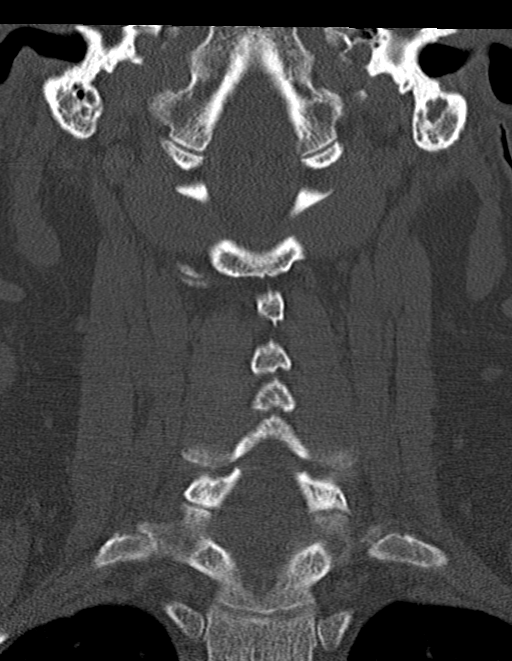
[im 34/57  bone]
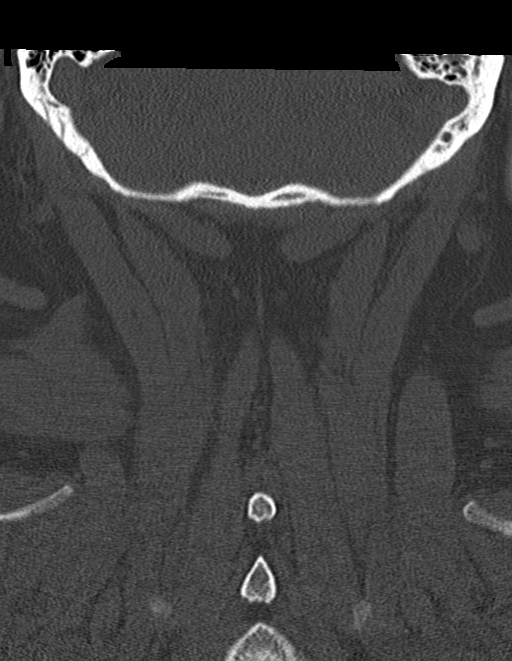

[Series 9: orthogonal bone · axial · 0.25mm/px · z∈[-325,-200]mm · 4 of 95 slices shown, 5 images]
[im 14/95  soft-tissue]
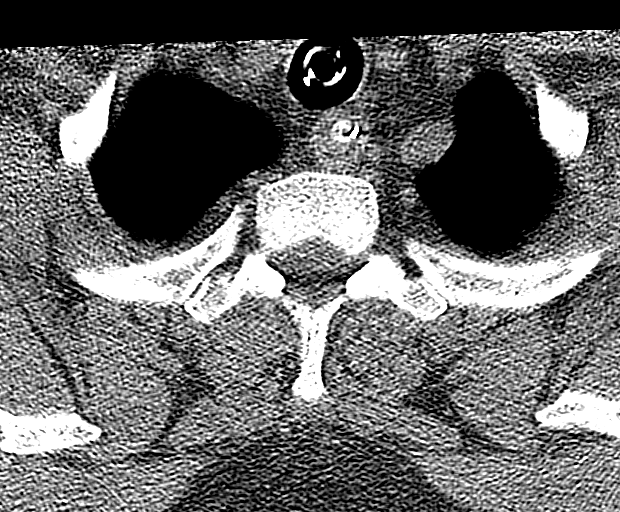
[im 14/95  bone]
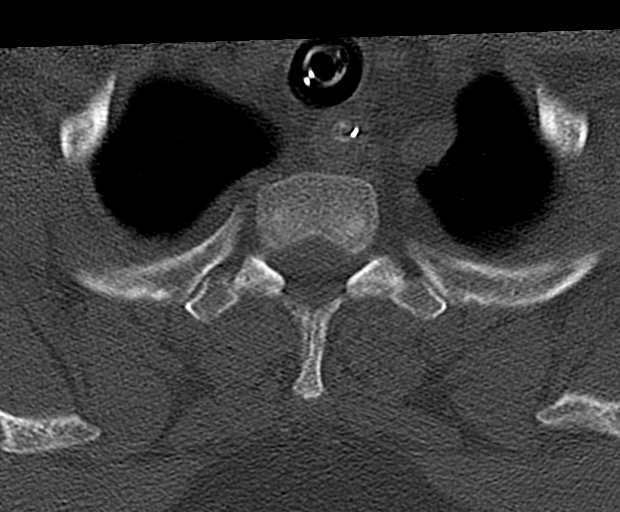
[im 41/95  bone]
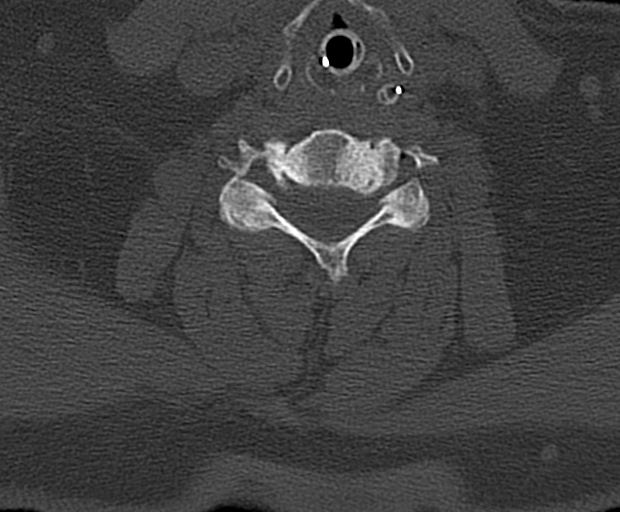
[im 54/95  bone]
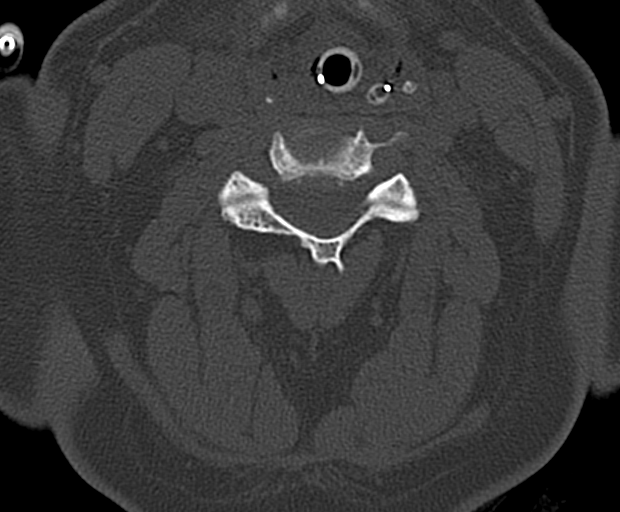
[im 81/95  bone]
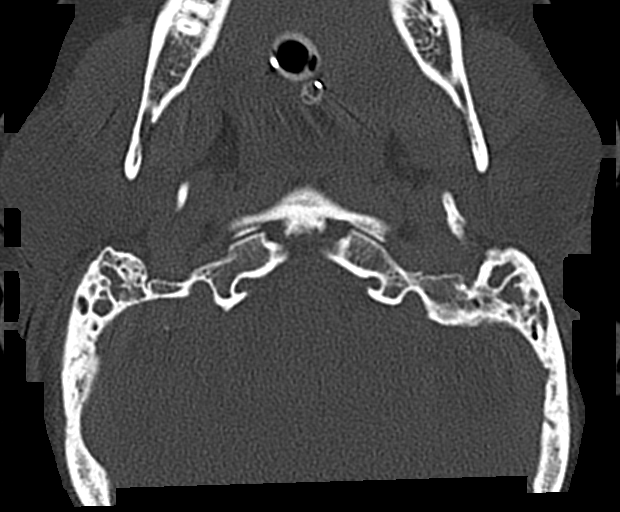

[12 of 33 positions shown; findings below may reference images not displayed]

FINDINGS: CT HEAD FINDINGS

Brain: Right infra tentorial dural calcifications, unchanged. No
intracranial hemorrhage, mass effect, or midline shift. No
hydrocephalus. The basilar cisterns are patent. No evidence of
territorial infarct or acute ischemia. No extra-axial or
intracranial fluid collection.

Vascular: No hyperdense vessel or unexpected calcification.

Skull: Left parietal scalp hematoma. No skull fracture. No focal
lesion.

Sinuses/Orbits: Mucosal thickening throughout the paranasal sinuses
may be related to intubation. Opacification of lower bilateral
mastoid air cells.

Other: None.

CT CERVICAL SPINE FINDINGS

Alignment: Normal.

Skull base and vertebrae: No acute fracture. Vertebral body heights
are maintained. The dens and skull base are intact.

Soft tissues and spinal canal: No prevertebral fluid or swelling. No
visible canal hematoma.

Disc levels: Disc space narrowing and endplate spurring C5-C6 and
C6-C7. Scattered facet arthropathy.

Upper chest: No acute finding, dedicated chest CT performed
concurrently.

Other: None.
IMPRESSION: 1. Left parietal scalp hematoma. No acute intracranial abnormality.
No skull fracture.
2. Mucosal thickening of the paranasal sinuses and mastoid air cell
opacification may be related to intubation.
3. Degenerative change in the cervical spine without acute fracture
or traumatic subluxation.
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
## 2018-04-10 MED ORDER — ENOXAPARIN SODIUM 40 MG/0.4ML ~~LOC~~ SOLN
40.0000 mg | SUBCUTANEOUS | Status: DC
  Administered 2018-04-10 – 2018-04-12 (×3): 40 mg via SUBCUTANEOUS
  Filled 2018-04-10 (×4): qty 0.4

## 2018-04-10 MED ORDER — HYDROMORPHONE HCL 1 MG/ML IJ SOLN: 1.0000 mg | Freq: Once | INTRAMUSCULAR | Status: DC

## 2018-04-10 MED ORDER — ACETAMINOPHEN 650 MG RE SUPP: 650.0000 mg | Freq: Four times a day (QID) | RECTAL | Status: DC | PRN

## 2018-04-10 MED ORDER — ADULT MULTIVITAMIN LIQUID CH
15.0000 mL | Freq: Every day | ORAL | Status: DC
  Administered 2018-04-10 – 2018-04-11 (×2): 15 mL
  Filled 2018-04-10 (×2): qty 15

## 2018-04-10 MED ORDER — PROPOFOL 1000 MG/100ML IV EMUL
5.0000 ug/kg/min | Freq: Once | INTRAVENOUS | Status: AC
  Administered 2018-04-11: 60 ug/kg/min via INTRAVENOUS
  Filled 2018-04-10: qty 100

## 2018-04-10 MED ORDER — ONDANSETRON HCL 4 MG PO TABS: 4.0000 mg | ORAL_TABLET | Freq: Four times a day (QID) | ORAL | Status: DC | PRN

## 2018-04-10 MED ORDER — FENTANYL CITRATE (PF) 100 MCG/2ML IJ SOLN
100.0000 ug | Freq: Once | INTRAMUSCULAR | Status: AC
  Administered 2018-04-10: 100 ug via INTRAVENOUS

## 2018-04-10 MED ORDER — LORAZEPAM 2 MG/ML IJ SOLN
1.0000 mg | Freq: Four times a day (QID) | INTRAMUSCULAR | Status: DC | PRN
  Administered 2018-04-10: 1 mg via INTRAVENOUS
  Administered 2018-04-11: 2 mg via INTRAVENOUS
  Administered 2018-04-12: 1 mg via INTRAVENOUS
  Filled 2018-04-10 (×3): qty 1

## 2018-04-10 MED ORDER — SODIUM CHLORIDE 0.9 % IV SOLN
3.0000 g | Freq: Four times a day (QID) | INTRAVENOUS | Status: DC
  Administered 2018-04-10 – 2018-04-11 (×7): 3 g via INTRAVENOUS
  Filled 2018-04-10 (×10): qty 3

## 2018-04-10 MED ORDER — FAMOTIDINE IN NACL 20-0.9 MG/50ML-% IV SOLN
20.0000 mg | INTRAVENOUS | Status: DC
  Administered 2018-04-10 – 2018-04-11 (×2): 20 mg via INTRAVENOUS
  Filled 2018-04-10 (×2): qty 50

## 2018-04-10 MED ORDER — SODIUM CHLORIDE 0.9 % IV BOLUS
1000.0000 mL | Freq: Once | INTRAVENOUS | Status: AC
  Administered 2018-04-10: 1000 mL via INTRAVENOUS

## 2018-04-10 MED ORDER — ADULT MULTIVITAMIN LIQUID CH
15.0000 mL | Freq: Every day | ORAL | Status: DC
  Filled 2018-04-10: qty 15

## 2018-04-10 MED ORDER — CHLORHEXIDINE GLUCONATE 0.12% ORAL RINSE (MEDLINE KIT)
15.0000 mL | Freq: Two times a day (BID) | OROMUCOSAL | Status: DC
  Administered 2018-04-10 – 2018-04-11 (×3): 15 mL via OROMUCOSAL

## 2018-04-10 MED ORDER — THIAMINE HCL 100 MG/ML IJ SOLN
100.0000 mg | Freq: Every day | INTRAMUSCULAR | Status: DC
  Administered 2018-04-10: 100 mg via INTRAVENOUS
  Filled 2018-04-10: qty 2

## 2018-04-10 MED ORDER — ALBUTEROL SULFATE (2.5 MG/3ML) 0.083% IN NEBU
2.5000 mg | INHALATION_SOLUTION | RESPIRATORY_TRACT | Status: DC | PRN
Start: 2018-04-10 — End: 2018-04-13

## 2018-04-10 MED ORDER — PROPOFOL 1000 MG/100ML IV EMUL
INTRAVENOUS | Status: AC
  Administered 2018-04-11: 60 ug/kg/min via INTRAVENOUS
  Filled 2018-04-10: qty 100

## 2018-04-10 MED ORDER — FOLIC ACID 5 MG/ML IJ SOLN
1.0000 mg | Freq: Every day | INTRAMUSCULAR | Status: DC
  Administered 2018-04-10: 1 mg via INTRAVENOUS
  Filled 2018-04-10 (×3): qty 0.2

## 2018-04-10 MED ORDER — POTASSIUM CHLORIDE IN NACL 20-0.9 MEQ/L-% IV SOLN
INTRAVENOUS | Status: DC
  Administered 2018-04-10 – 2018-04-11 (×3): via INTRAVENOUS
  Filled 2018-04-10 (×6): qty 1000

## 2018-04-10 MED ORDER — VITAL HIGH PROTEIN PO LIQD
1000.0000 mL | ORAL | Status: DC
  Administered 2018-04-10: 1000 mL

## 2018-04-10 MED ORDER — FENTANYL CITRATE (PF) 100 MCG/2ML IJ SOLN
200.0000 ug | Freq: Once | INTRAMUSCULAR | Status: AC
  Administered 2018-04-10: 200 ug via INTRAVENOUS

## 2018-04-10 MED ORDER — ORAL CARE MOUTH RINSE
15.0000 mL | OROMUCOSAL | Status: DC
  Administered 2018-04-10 – 2018-04-11 (×11): 15 mL via OROMUCOSAL

## 2018-04-10 MED ORDER — PHENYLEPHRINE HCL 10 MG/ML IJ SOLN
0.0000 ug/min | INTRAMUSCULAR | Status: DC
  Filled 2018-04-10: qty 1

## 2018-04-10 MED ORDER — POTASSIUM CHLORIDE 20 MEQ/15ML (10%) PO SOLN
80.0000 meq | Freq: Once | ORAL | Status: AC
  Administered 2018-04-10: 80 meq via ORAL
  Filled 2018-04-10: qty 60

## 2018-04-10 MED ORDER — MAGNESIUM SULFATE 2 GM/50ML IV SOLN
2.0000 g | Freq: Once | INTRAVENOUS | Status: AC
  Administered 2018-04-10: 2 g via INTRAVENOUS
  Filled 2018-04-10: qty 50

## 2018-04-10 MED ORDER — PROPOFOL 1000 MG/100ML IV EMUL
5.0000 ug/kg/min | INTRAVENOUS | Status: DC
  Administered 2018-04-10: 70 ug/kg/min via INTRAVENOUS
  Administered 2018-04-10: 40 ug/kg/min via INTRAVENOUS
  Administered 2018-04-10: 60 ug/kg/min via INTRAVENOUS
  Administered 2018-04-11: 50 ug/kg/min via INTRAVENOUS
  Filled 2018-04-10 (×5): qty 100

## 2018-04-10 MED ORDER — POTASSIUM CHLORIDE 10 MEQ/100ML IV SOLN
10.0000 meq | INTRAVENOUS | Status: AC
  Administered 2018-04-10 (×4): 10 meq via INTRAVENOUS
  Filled 2018-04-10 (×4): qty 100

## 2018-04-10 MED ORDER — ONDANSETRON HCL 4 MG/2ML IJ SOLN
4.0000 mg | Freq: Four times a day (QID) | INTRAMUSCULAR | Status: DC | PRN
Start: 2018-04-10 — End: 2018-04-13

## 2018-04-10 MED ORDER — PHENYLEPHRINE HCL-NACL 10-0.9 MG/250ML-% IV SOLN
0.0000 ug/min | INTRAVENOUS | Status: DC
  Administered 2018-04-10: 20 ug/min via INTRAVENOUS
  Filled 2018-04-10 (×2): qty 250

## 2018-04-10 MED ORDER — ACETAMINOPHEN 325 MG PO TABS
650.0000 mg | ORAL_TABLET | Freq: Four times a day (QID) | ORAL | Status: DC | PRN
  Administered 2018-04-11: 650 mg via ORAL
  Filled 2018-04-10: qty 2

## 2018-04-10 MED ORDER — IOHEXOL 300 MG/ML  SOLN
100.0000 mL | Freq: Once | INTRAMUSCULAR | Status: AC | PRN
  Administered 2018-04-10: 100 mL via INTRAVENOUS

## 2018-04-10 MED ORDER — FENTANYL 2500MCG IN NS 250ML (10MCG/ML) PREMIX INFUSION
0.0000 ug/h | INTRAVENOUS | Status: DC
  Administered 2018-04-10: 200 ug/h via INTRAVENOUS
  Administered 2018-04-10: 150 ug/h via INTRAVENOUS
  Filled 2018-04-10 (×3): qty 250

## 2018-04-10 NOTE — ED Notes (Signed)
Pt waking from sedation, drips titrated

## 2018-04-10 NOTE — ED Notes (Signed)
Pt waking, propofol titrated

## 2018-04-10 NOTE — ED Notes (Signed)
tele-sitter rolled into room, number called, Judith Drake, NT no longer at bedside  Report to Artricia att
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
## 2018-04-10 NOTE — ED Notes (Signed)
Pt agitated after foley attempted
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
## 2018-04-10 NOTE — ED Notes (Signed)
CRITICAL LAB: ETOH is 428, [facility], Dr. Mable Paris notified, orders received

## 2018-04-10 NOTE — ED Notes (Signed)
CRITICAL LAB: potassium is 2.4, [facility], Dr. Mable Paris notified, orders received

## 2018-04-10 NOTE — ED Notes (Signed)
Pt back to room with this RN and RT

## 2018-04-10 NOTE — ED Notes (Addendum)
Soft mittens applied to pt's hands

## 2018-04-10 NOTE — Progress Notes (Signed)
Pharmacy Antibiotic Note  Judith Drake is a 47 y.o. female admitted on 04/09/2018 with aspiration pneumonia.  Pharmacy has been consulted for unasyn dosing.  Plan: Will start Unasyn 3g IV q6h     Temp (24hrs), Avg:98.6 F (37 C), Min:98.6 F (37 C), Max:98.6 F (37 C)  Recent Labs  Lab 04/09/18 2327  WBC 12.3*  CREATININE 0.63    Estimated Creatinine Clearance: 82.7 mL/min (by C-G formula based on SCr of 0.63 mg/dL).    Allergies  Allergen Reactions  . Amoxicillin Nausea And Vomiting    Thank you for allowing pharmacy to be a part of this patient's care.  Tobie Lords, PharmD, BCPS Clinical Pharmacist 04/10/2018

## 2018-04-10 NOTE — ED Notes (Signed)
PT OG TUBE CLAMPED - leave for 2 hours d/t med infusion
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
## 2018-04-10 NOTE — Progress Notes (Signed)
Initial Nutrition Assessment  DOCUMENTATION CODES:   Not applicable  INTERVENTION:  If patient expected to remain intubated >24-48 hours, recommend initiating tube feeds.  Recommend initiating Vital High Protein at 15 mL/hr and advancing by 15 mL/hr every 8 hours to goal regimen of Vital High Protein at 45 mL/hr (1080 mL goal daily volume). Provides 1080 kcal, 95 grams of protein, 907 mL H2O daily. With current propofol rate provides 1516 kcal daily.  Provide free water flush of 30 mL Q4hrs to maintain tube patency. Patient receiving IV fluids at 125 mL/hr.  Continue liquid MVI daily, folic acid 1 mg daily, thiamine 100 mg daily in setting of EtOH abuse.  Monitor magnesium, potassium, and phosphorus daily for at least 3 days, MD to replete as needed, as pt is at risk for refeeding syndrome given EtOH abuse.  NUTRITION DIAGNOSIS:   Inadequate oral intake related to inability to eat as evidenced by NPO status.  GOAL:   Provide needs based on ASPEN/SCCM guidelines  MONITOR:   Vent status, Labs, Weight trends, TF tolerance, I & O's  REASON FOR ASSESSMENT:   Ventilator    ASSESSMENT:   47 year old female with PMHx of EtOH abuse, HTN, GERD, anemia who is now admitted after fall related to EtOH intoxication with left parietal scalp hematoma, acute hypoxic respiratory failure likely secondary to aspiration requiring intubation on 8/4, transaminitis secondary to EtOH abuse.   Patient intubated and sedated. On PRVC mode with FiO2 35%. Failed SBT this AM but plan may be for another SBT today. Abdomen feels soft. Weight appears stable in chart.  Access: 16 Fr. OGT placed 8/4; terminates in mid stomach per CT Abd/Pelvis 8/4; marked at lip  MAP: 57-93 mmHg  Patient is currently intubated on ventilator support Ve: 8.2 L/min Temp (24hrs), Avg:98.8 F (37.1 C), Min:98.6 F (37 C), Max:99 F (37.2 C)  Propofol: 16.5 ml/hr (436 kcal daily)  Medications reviewed and include: folic  acid 1 mg IV daily, liquid MVI daily per tube, thiamine 100 mg IV daily, NS with KCl 20 mEq/L at 125 mL/hr, Unasyn, famotidine, fentanyl gtt, potassium chloride 10 mEq IV 4 times today, propofol gtt.  Labs reviewed: CBG 71-84, Chloride 113, BUN <5. Ethyl alcohol 197.  I/O: 1900 mL UOP overnight  Patient does not meet criteria for malnutrition at this time but is at risk for malnutrition in setting of EtOH abuse.  Discussed with RN. Also discussed with MD. Possible extubation in 24-48 hours.  NUTRITION - FOCUSED PHYSICAL EXAM:    Most Recent Value  Orbital Region  No depletion  Upper Arm Region  No depletion  Thoracic and Lumbar Region  No depletion  Buccal Region  Unable to assess  Temple Region  No depletion  Clavicle Bone Region  No depletion  Clavicle and Acromion Bone Region  No depletion  Scapular Bone Region  Unable to assess  Dorsal Hand  No depletion  Patellar Region  Moderate depletion  Anterior Thigh Region  Moderate depletion  Posterior Calf Region  Mild depletion  Edema (RD Assessment)  None  Hair  Reviewed  Eyes  Unable to assess  Mouth  Unable to assess  Skin  Reviewed  Nails  Reviewed     Diet Order:   Diet Order           Diet NPO time specified  Diet effective now          EDUCATION NEEDS:   No education needs have been identified at this  time  Skin:  Skin Assessment: Skin Integrity Issues:(hematoma to left scalp; laceration 6cm left head)  Last BM:  Unknown/PTA  Height:   Ht Readings from Last 1 Encounters:  04/10/18 5\' 3"  (1.6 m)    Weight:   Wt Readings from Last 1 Encounters:  04/10/18 154 lb 12.2 oz (70.2 kg)    Ideal Body Weight:  52.3 kg  BMI:  Body mass index is 27.42 kg/m.  Estimated Nutritional Needs:   Kcal:  1521 (PSU 2003b w/ MSJ 1319, Ve 8.2, Tmax 37.2)  Protein:  85-105 grams (1.2-1.5 grams/kg)  Fluid:  2.1 L/day (30 mL/kg)  Willey Blade, MS, RD, LDN Office: [phone] Pager: [phone] After  Hours/Weekend Pager: [phone]
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
## 2018-04-10 NOTE — ED Notes (Signed)
Informed by charge no room in ICU, will hold pt

## 2018-04-10 NOTE — ED Notes (Signed)
ICU, [facility] Dyer Provider at bedside.
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
## 2018-04-10 NOTE — ED Notes (Signed)
Patient transported to CT with this RN and RT  

## 2018-04-11 ENCOUNTER — Encounter: Payer: Self-pay | Admitting: Family

## 2018-04-11 ENCOUNTER — Telehealth: Payer: Self-pay | Admitting: Family

## 2018-04-11 DIAGNOSIS — F1010 Alcohol abuse, uncomplicated: Secondary | ICD-10-CM

## 2018-04-11 LAB — PROCALCITONIN: Procalcitonin: <0.1 ng/mL

## 2018-04-11 LAB — MAGNESIUM: Magnesium: 1.8 mg/dL (ref 1.7–2.4)

## 2018-04-11 LAB — GLUCOSE, CAPILLARY
GLUCOSE-CAPILLARY: 83 mg/dL (ref 70–99)
GLUCOSE-CAPILLARY: 91 mg/dL (ref 70–99)
GLUCOSE-CAPILLARY: 83 mg/dL (ref 70–99)
Glucose-Capillary: 93 mg/dL (ref 70–99)
Glucose-Capillary: 87 mg/dL (ref 70–99)
Glucose-Capillary: 83 mg/dL (ref 70–99)

## 2018-04-11 LAB — PHOSPHORUS: PHOSPHORUS: 4.5 mg/dL (ref 2.5–4.6)

## 2018-04-11 MED ORDER — BUSPIRONE HCL 10 MG PO TABS
10.0000 mg | ORAL_TABLET | Freq: Three times a day (TID) | ORAL | Status: DC
  Administered 2018-04-11 – 2018-04-13 (×6): 10 mg via ORAL
  Filled 2018-04-11 (×8): qty 1

## 2018-04-11 MED ORDER — GABAPENTIN 100 MG PO CAPS
200.0000 mg | ORAL_CAPSULE | Freq: Every day | ORAL | Status: DC
  Administered 2018-04-11 – 2018-04-12 (×2): 200 mg via ORAL
  Filled 2018-04-11 (×2): qty 2

## 2018-04-11 MED ORDER — ADULT MULTIVITAMIN W/MINERALS CH
1.0000 | ORAL_TABLET | Freq: Every day | ORAL | Status: DC
  Administered 2018-04-12 – 2018-04-13 (×2): 1 via ORAL
  Filled 2018-04-11 (×2): qty 1

## 2018-04-11 MED ORDER — FOLIC ACID 1 MG PO TABS
1.0000 mg | ORAL_TABLET | Freq: Every day | ORAL | Status: DC
  Administered 2018-04-12 – 2018-04-13 (×2): 1 mg via ORAL
  Filled 2018-04-11 (×2): qty 1

## 2018-04-11 MED ORDER — FAMOTIDINE 20 MG PO TABS: 20.0000 mg | ORAL_TABLET | Freq: Two times a day (BID) | ORAL | Status: DC

## 2018-04-11 MED ORDER — PHENOL 1.4 % MT LIQD
1.0000 | OROMUCOSAL | Status: DC | PRN
Start: 2018-04-11 — End: 2018-04-13
  Administered 2018-04-12: 1 via OROMUCOSAL
  Filled 2018-04-11: qty 177

## 2018-04-11 MED ORDER — VITAMIN B-1 100 MG PO TABS
100.0000 mg | ORAL_TABLET | Freq: Every day | ORAL | Status: DC
  Administered 2018-04-11: 100 mg
  Filled 2018-04-11: qty 1

## 2018-04-11 MED ORDER — MAGNESIUM SULFATE 2 GM/50ML IV SOLN
2.0000 g | Freq: Once | INTRAVENOUS | Status: AC
  Administered 2018-04-11: 2 g via INTRAVENOUS
  Filled 2018-04-11: qty 50

## 2018-04-11 MED ORDER — VITAMIN B-1 100 MG PO TABS
100.0000 mg | ORAL_TABLET | Freq: Every day | ORAL | Status: DC
  Administered 2018-04-12 – 2018-04-13 (×2): 100 mg via ORAL
  Filled 2018-04-11 (×2): qty 1

## 2018-04-11 MED ORDER — FOLIC ACID 1 MG PO TABS
1.0000 mg | ORAL_TABLET | Freq: Every day | ORAL | Status: DC
  Administered 2018-04-11: 1 mg
  Filled 2018-04-11: qty 1
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
## 2018-04-11 NOTE — Progress Notes (Signed)
Pharmacy Electrolyte Monitoring Consult:  Pharmacy consulted to assist in monitoring and replacing electrolytes in this 47 y.o. female admitted on 04/09/2018 with Fall and Head Injury   Labs:  Sodium (mmol/L)  Date Value  04/10/2018 145   Potassium (mmol/L)  Date Value  04/10/2018 3.9   Magnesium (mg/dL)  Date Value  04/11/2018 1.8   Phosphorus (mg/dL)  Date Value  04/11/2018 4.5   Calcium (mg/dL)  Date Value  04/10/2018 7.5 (L)   Albumin (g/dL)  Date Value  04/09/2018 4.5    Assessment/Plan: Will replace magnesium 2g IV x 1.   Will replace for goal magnesium ~ 2 and goal potassium ~4.   Will recheck electrolytes with am labs.   Pharmacy will continue to monitor and adjust per consult.   Simpson,Michael L 04/11/2018 1:50 PM

## 2018-04-11 NOTE — Progress Notes (Signed)
Tylenol 650 mg crushed and put down OG tube for oral temperature of 101.5.

## 2018-04-11 NOTE — Progress Notes (Signed)
Patient awake alert and following commands.

## 2018-04-11 NOTE — Progress Notes (Signed)
Propofol dose decreased to 25 mcg and fentanyl turned off for vent wean.

## 2018-04-11 NOTE — Progress Notes (Signed)
Propofol turned off and patient placed in spontaneous by cardiopulmonary for vent wean.

## 2018-04-11 NOTE — Progress Notes (Signed)
CHIEF COMPLAINT:   Chief Complaint  Patient presents with  . Fall  . Head Injury  encephalopathy  Subjective  Intubated,sedated Plan for SAT/SBT today Remains critically ill, severe resp failure     Objective   Examination: PHYSICAL EXAMINATION:  GENERAL:critically ill appearing, +resp distress HEAD: Normocephalic, atraumatic.  EYES: Pupils equal, round, reactive to light.  No scleral icterus.  MOUTH: Moist mucosal membrane. NECK: Supple. No thyromegaly. No nodules. No JVD.  PULMONARY: +rhonchi, +wheezing CARDIOVASCULAR: S1 and S2. Regular rate and rhythm. No murmurs, rubs, or gallops.  GASTROINTESTINAL: Soft, nontender, -distended. No masses. Positive bowel sounds. No hepatosplenomegaly.  MUSCULOSKELETAL: No swelling, clubbing, or edema.  NEUROLOGIC: obtunded SKIN:intact,warm,dry    VITALS:  height is 5\' 3"  (1.6 m) and weight is 159 lb 9.8 oz (72.4 kg). Her oral temperature is 100.6 F (38.1 C) (abnormal). Her blood pressure is 112/68 and her pulse is 108 (abnormal). Her respiration is 18 and oxygen saturation is 100%.   I personally reviewed Labs under Results section.  Radiology Reports Ct Head Wo Contrast  Result Date: 04/10/2018 CLINICAL DATA:  Fall, back of head hematoma.  ETOH. EXAM: CT HEAD WITHOUT CONTRAST CT CERVICAL SPINE WITHOUT CONTRAST TECHNIQUE: Multidetector CT imaging of the head and cervical spine was performed following the standard protocol without intravenous contrast. Multiplanar CT image reconstructions of the cervical spine were also generated. COMPARISON:  Head CT 02/24/2005 FINDINGS: CT HEAD FINDINGS Brain: Right infra tentorial dural calcifications, unchanged. No intracranial hemorrhage, mass effect, or midline shift. No hydrocephalus. The basilar cisterns are patent. No evidence of territorial infarct or acute ischemia. No extra-axial or intracranial fluid collection. Vascular: No hyperdense vessel or unexpected calcification. Skull: Left  parietal scalp hematoma. No skull fracture. No focal lesion. Sinuses/Orbits: Mucosal thickening throughout the paranasal sinuses may be related to intubation. Opacification of lower bilateral mastoid air cells. Other: None. CT CERVICAL SPINE FINDINGS Alignment: Normal. Skull base and vertebrae: No acute fracture. Vertebral body heights are maintained. The dens and skull base are intact. Soft tissues and spinal canal: No prevertebral fluid or swelling. No visible canal hematoma. Disc levels: Disc space narrowing and endplate spurring D9-R4 and C6-C7. Scattered facet arthropathy. Upper chest: No acute finding, dedicated chest CT performed concurrently. Other: None. IMPRESSION: 1. Left parietal scalp hematoma. No acute intracranial abnormality. No skull fracture. 2. Mucosal thickening of the paranasal sinuses and mastoid air cell opacification may be related to intubation. 3. Degenerative change in the cervical spine without acute fracture or traumatic subluxation. Electronically Signed   By: Jeb Levering M.D.   On: 04/10/2018 01:06   Ct Chest W Contrast  Result Date: 04/10/2018 CLINICAL DATA:  Extreme ethanol. Possible fall. Hematoma to back of head. Intubated. Abdominal trauma, blunt, stable. EXAM: CT CHEST, ABDOMEN, AND PELVIS WITH CONTRAST TECHNIQUE: Multidetector CT imaging of the chest, abdomen and pelvis was performed following the standard protocol during bolus administration of intravenous contrast. CONTRAST:  16mL OMNIPAQUE IOHEXOL 300 MG/ML  SOLN COMPARISON:  06/02/2013 FINDINGS: CT CHEST FINDINGS Cardiovascular: The heart size is normal. No substantial pericardial effusion. Coronary artery calcification is evident. Endotracheal tube and NG tubes noted. Mediastinum/Nodes: No mediastinal lymphadenopathy. There is no hilar lymphadenopathy. There is no axillary lymphadenopathy. Lungs/Pleura: Dependent atelectasis in the lower lobes bilaterally. Musculoskeletal: Old posterior right lower rib fractures  evident. CT ABDOMEN PELVIS FINDINGS Hepatobiliary: No focal abnormality within the liver parenchyma. There is no evidence for gallstones, gallbladder wall thickening, or pericholecystic fluid. No intrahepatic or extrahepatic biliary dilation. Pancreas:  No focal mass lesion. No dilatation of the main duct. No intraparenchymal cyst. No peripancreatic edema. Spleen: No splenomegaly. No focal mass lesion. Adrenals/Urinary Tract: No adrenal nodule or mass. Kidneys unremarkable. No evidence for hydroureter. The urinary bladder appears normal for the degree of distention. Stomach/Bowel: NG tube tip is in the mid stomach. Duodenum is normally positioned as is the ligament of Treitz. No small bowel wall thickening. No small bowel dilatation. The terminal ileum is normal. The appendix is normal. No gross colonic mass. No colonic wall thickening. No substantial diverticular change. Vascular/Lymphatic: No abdominal aortic aneurysm. No abdominal aortic atherosclerotic calcification. There is no gastrohepatic or hepatoduodenal ligament lymphadenopathy. No intraperitoneal or retroperitoneal lymphadenopathy. No pelvic sidewall lymphadenopathy. Reproductive: Uterus unremarkable.  There is no adnexal mass. Other: No intraperitoneal free fluid. Musculoskeletal: No worrisome lytic or sclerotic osseous abnormality. IMPRESSION: 1. No acute findings in the chest, abdomen, or pelvis. 2. Endotracheal tube tip in the mid to distal trachea with NG tube tip in the mid stomach. 3. Old posterior right eleventh rib fracture. Electronically Signed   By: Misty Stanley M.D.   On: 04/10/2018 01:16   Ct Cervical Spine Wo Contrast  Result Date: 04/10/2018 CLINICAL DATA:  Fall, back of head hematoma.  ETOH. EXAM: CT HEAD WITHOUT CONTRAST CT CERVICAL SPINE WITHOUT CONTRAST TECHNIQUE: Multidetector CT imaging of the head and cervical spine was performed following the standard protocol without intravenous contrast. Multiplanar CT image reconstructions  of the cervical spine were also generated. COMPARISON:  Head CT 02/24/2005 FINDINGS: CT HEAD FINDINGS Brain: Right infra tentorial dural calcifications, unchanged. No intracranial hemorrhage, mass effect, or midline shift. No hydrocephalus. The basilar cisterns are patent. No evidence of territorial infarct or acute ischemia. No extra-axial or intracranial fluid collection. Vascular: No hyperdense vessel or unexpected calcification. Skull: Left parietal scalp hematoma. No skull fracture. No focal lesion. Sinuses/Orbits: Mucosal thickening throughout the paranasal sinuses may be related to intubation. Opacification of lower bilateral mastoid air cells. Other: None. CT CERVICAL SPINE FINDINGS Alignment: Normal. Skull base and vertebrae: No acute fracture. Vertebral body heights are maintained. The dens and skull base are intact. Soft tissues and spinal canal: No prevertebral fluid or swelling. No visible canal hematoma. Disc levels: Disc space narrowing and endplate spurring H0-W2 and C6-C7. Scattered facet arthropathy. Upper chest: No acute finding, dedicated chest CT performed concurrently. Other: None. IMPRESSION: 1. Left parietal scalp hematoma. No acute intracranial abnormality. No skull fracture. 2. Mucosal thickening of the paranasal sinuses and mastoid air cell opacification may be related to intubation. 3. Degenerative change in the cervical spine without acute fracture or traumatic subluxation. Electronically Signed   By: Jeb Levering M.D.   On: 04/10/2018 01:06   Ct Abdomen Pelvis W Contrast  Result Date: 04/10/2018 CLINICAL DATA:  Extreme ethanol. Possible fall. Hematoma to back of head. Intubated. Abdominal trauma, blunt, stable. EXAM: CT CHEST, ABDOMEN, AND PELVIS WITH CONTRAST TECHNIQUE: Multidetector CT imaging of the chest, abdomen and pelvis was performed following the standard protocol during bolus administration of intravenous contrast. CONTRAST:  173mL OMNIPAQUE IOHEXOL 300 MG/ML  SOLN  COMPARISON:  06/02/2013 FINDINGS: CT CHEST FINDINGS Cardiovascular: The heart size is normal. No substantial pericardial effusion. Coronary artery calcification is evident. Endotracheal tube and NG tubes noted. Mediastinum/Nodes: No mediastinal lymphadenopathy. There is no hilar lymphadenopathy. There is no axillary lymphadenopathy. Lungs/Pleura: Dependent atelectasis in the lower lobes bilaterally. Musculoskeletal: Old posterior right lower rib fractures evident. CT ABDOMEN PELVIS FINDINGS Hepatobiliary: No focal abnormality within the liver parenchyma.  There is no evidence for gallstones, gallbladder wall thickening, or pericholecystic fluid. No intrahepatic or extrahepatic biliary dilation. Pancreas: No focal mass lesion. No dilatation of the main duct. No intraparenchymal cyst. No peripancreatic edema. Spleen: No splenomegaly. No focal mass lesion. Adrenals/Urinary Tract: No adrenal nodule or mass. Kidneys unremarkable. No evidence for hydroureter. The urinary bladder appears normal for the degree of distention. Stomach/Bowel: NG tube tip is in the mid stomach. Duodenum is normally positioned as is the ligament of Treitz. No small bowel wall thickening. No small bowel dilatation. The terminal ileum is normal. The appendix is normal. No gross colonic mass. No colonic wall thickening. No substantial diverticular change. Vascular/Lymphatic: No abdominal aortic aneurysm. No abdominal aortic atherosclerotic calcification. There is no gastrohepatic or hepatoduodenal ligament lymphadenopathy. No intraperitoneal or retroperitoneal lymphadenopathy. No pelvic sidewall lymphadenopathy. Reproductive: Uterus unremarkable.  There is no adnexal mass. Other: No intraperitoneal free fluid. Musculoskeletal: No worrisome lytic or sclerotic osseous abnormality. IMPRESSION: 1. No acute findings in the chest, abdomen, or pelvis. 2. Endotracheal tube tip in the mid to distal trachea with NG tube tip in the mid stomach. 3. Old  posterior right eleventh rib fracture. Electronically Signed   By: Misty Stanley M.D.   On: 04/10/2018 01:16   Dg Chest Port 1 View  Result Date: 04/10/2018 CLINICAL DATA:  Status post intubation. EXAM: PORTABLE CHEST 1 VIEW COMPARISON:  12/13/2017 FINDINGS: 2357 hours. Endotracheal tube tip is 3.7 cm above the base of the carina. The NG tube passes into the stomach although the distal tip position is not included on the film. The lungs are clear without focal pneumonia, edema, pneumothorax or pleural effusion. Atelectasis noted left base. The cardiopericardial silhouette is within normal limits for size. The visualized bony structures of the thorax are intact. IMPRESSION: 1. Endotracheal tube tip 3.7 cm above the carina. 2. Left base atelectasis. Electronically Signed   By: Misty Stanley M.D.   On: 04/10/2018 00:39       Assessment/Plan:  47 yo white female with acute ETOH abuse and intoxication s/p fall with acute encephalopathy and severe resp failure failure to protect airway  Plan for trial of extubation today fio2 as needed   Critical Care Time devoted to patient care services described in this note is 32 minutes.   Overall, patient is critically ill, prognosis is guarded.     Corrin Parker, M.D.  [facility]  Medical Director [place] Director [facility] Cardio-Pulmonary Department

## 2018-04-11 NOTE — Telephone Encounter (Signed)
mychart message sent

## 2018-04-11 NOTE — Progress Notes (Signed)
Patient extubated and placed on 2L nasal cannula.  Patient able to cough and state name upon extubation. Vital signs stable.

## 2018-04-11 NOTE — Progress Notes (Signed)
PHARMACIST - PHYSICIAN COMMUNICATION  CONCERNING: IV to Oral Route Change Policy  RECOMMENDATION: This patient is receiving thiamine and folic acid by the intravenous route.  Based on criteria approved by the Pharmacy and Therapeutics Committee, the intravenous medication(s) is/are being converted to the equivalent oral dose form(s).   DESCRIPTION: These criteria include:  The patient is eating (either orally or via tube) and/or has been taking other orally administered medications for a least 24 hours  The patient has no evidence of active gastrointestinal bleeding or impaired GI absorption (gastrectomy, short bowel, patient on TNA or NPO).  If you have questions about this conversion, please contact the Pharmacy Department  []   [phone] )  [name] [x]   [phone] )  [name] []   [phone] )  [name] []   [phone] )  [name] []   [phone] )  [name], [name] 04/11/2018 9:09 AM
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
## 2018-04-11 NOTE — Progress Notes (Signed)
Patient successfully extubated to 2l [place] with no complications. Patients saturations are 100% on 2lnc and patient is able to cough. Will continue to monitor.

## 2018-04-12 ENCOUNTER — Other Ambulatory Visit: Payer: Self-pay | Admitting: Family

## 2018-04-12 LAB — BASIC METABOLIC PANEL
Anion gap: 10 (ref 5–15)
CHLORIDE: 99 mmol/L (ref 98–111)
CO2: 29 mmol/L (ref 22–32)
Calcium: 8.8 mg/dL — ABNORMAL LOW (ref 8.9–10.3)
Creatinine, Ser: 0.42 mg/dL — ABNORMAL LOW (ref 0.44–1.00)
GFR calc Af Amer: >60 mL/min (ref >60)
GFR calc non Af Amer: >60 mL/min (ref >60)
GLUCOSE: 80 mg/dL (ref 70–99)
POTASSIUM: 3.5 mmol/L (ref 3.5–5.1)
Sodium: 138 mmol/L (ref 135–145)

## 2018-04-12 LAB — GLUCOSE, CAPILLARY
GLUCOSE-CAPILLARY: 81 mg/dL (ref 70–99)
GLUCOSE-CAPILLARY: 112 mg/dL — AB (ref 70–99)
Glucose-Capillary: 69 mg/dL — ABNORMAL LOW (ref 70–99)
Glucose-Capillary: 103 mg/dL — ABNORMAL HIGH (ref 70–99)
Glucose-Capillary: 173 mg/dL — ABNORMAL HIGH (ref 70–99)
Glucose-Capillary: 115 mg/dL — ABNORMAL HIGH (ref 70–99)

## 2018-04-12 LAB — MAGNESIUM: Magnesium: 2 mg/dL (ref 1.7–2.4)

## 2018-04-12 MED ORDER — KETOROLAC TROMETHAMINE 30 MG/ML IJ SOLN: 15.0000 mg | Freq: Four times a day (QID) | INTRAMUSCULAR | Status: DC | PRN

## 2018-04-12 MED ORDER — IBUPROFEN 400 MG PO TABS: 400.0000 mg | ORAL_TABLET | Freq: Four times a day (QID) | ORAL | Status: DC | PRN

## 2018-04-12 MED ORDER — TRAMADOL HCL 50 MG PO TABS
50.0000 mg | ORAL_TABLET | Freq: Four times a day (QID) | ORAL | Status: DC | PRN
  Administered 2018-04-12: 21:00:00 50 mg via ORAL
  Filled 2018-04-12: qty 1

## 2018-04-12 MED ORDER — KETOROLAC TROMETHAMINE 15 MG/ML IJ SOLN
15.0000 mg | Freq: Four times a day (QID) | INTRAMUSCULAR | Status: DC | PRN
  Administered 2018-04-12: 15 mg via INTRAVENOUS
  Filled 2018-04-12 (×2): qty 1

## 2018-04-12 MED ORDER — POTASSIUM CHLORIDE CRYS ER 20 MEQ PO TBCR
40.0000 meq | EXTENDED_RELEASE_TABLET | Freq: Once | ORAL | Status: AC
  Administered 2018-04-12: 10:00:00 40 meq via ORAL
  Filled 2018-04-12: qty 2

## 2018-04-12 MED ORDER — AMLODIPINE BESYLATE 5 MG PO TABS
5.0000 mg | ORAL_TABLET | Freq: Every day | ORAL | Status: DC
  Administered 2018-04-12 – 2018-04-13 (×2): 5 mg via ORAL
  Filled 2018-04-12 (×2): qty 1

## 2018-04-12 MED ORDER — CYCLOBENZAPRINE HCL 10 MG PO TABS
5.0000 mg | ORAL_TABLET | Freq: Three times a day (TID) | ORAL | Status: DC | PRN
  Administered 2018-04-12: 5 mg via ORAL
  Filled 2018-04-12: qty 1

## 2018-04-12 NOTE — Care Management (Signed)
RNCM consulted on patient per MD request. Patient from home with husband and two children. Recently taken off of ventilation support. MD thinks home health services may be beneficial since the patient has residual weakness from being bed bound for the last two days. PT is pending. Per patient she has no safety issues and is not interested in home health at this time  but will see how she does with PT. Patient has autistic son and voices concern that having home health people may upset her son. I offered outpatient therapy services as well. SHe would like to see how PT goes and how she feels closer to time of discharge to decide what is needed.Care management team will continue to follow.  Ines Bloomer RN BSN RNCM [phone]

## 2018-04-12 NOTE — Progress Notes (Signed)
Pharmacy Electrolyte Monitoring Consult:  Pharmacy consulted to assist in monitoring and replacing electrolytes in this 47 y.o. female admitted on 04/09/2018 with Fall and Head Injury  Patient received magnesium 2g IV x 1 on 8/5.   Labs:  Sodium (mmol/L)  Date Value  04/12/2018 138   Potassium (mmol/L)  Date Value  04/12/2018 3.5   Magnesium (mg/dL)  Date Value  04/12/2018 2.0   Phosphorus (mg/dL)  Date Value  04/11/2018 4.5   Calcium (mg/dL)  Date Value  04/12/2018 8.8 (L)   Albumin (g/dL)  Date Value  04/09/2018 4.5    Assessment/Plan: Potassium 46mEq PO x 1.   Will replace for goal magnesium ~ 2 and goal potassium ~4.   Will recheck electrolytes with am labs.   Pharmacy will continue to monitor and adjust per consult.   Marylon Verno L 04/12/2018 8:57 AM

## 2018-04-12 NOTE — Progress Notes (Signed)
Floor unable to take report at 7:00 am from third shift nurse [facility]. I reported to [facility] RN. Patient alert, room air, staples intact. Blood sugar 81. Foley removed. Will be on tele. Patient being tx by wheel chair.

## 2018-04-12 NOTE — Progress Notes (Signed)
   CHIEF COMPLAINT:   Chief Complaint  Patient presents with  . Fall  . Head Injury    Subjective  Successfully extubated Alert and awake Follows commands Lethargic VS stable     Objective   Examination:  General exam: Appears calm and comfortable  Respiratory system: Clear to auscultation. Respiratory effort normal. HEENT: [place]/AT, PERRLA, no thrush, no stridor. Cardiovascular system: S1 & S2 heard, RRR. No JVD, murmurs, rubs, gallops or clicks. No pedal edema. Gastrointestinal system: Abdomen is nondistended, soft and nontender. No organomegaly or masses felt. Normal bowel sounds heard. Central nervous system: Alert and oriented. No focal neurological deficits. Extremities: Symmetric 5 x 5 power. Skin: No rashes, lesions or ulcers Psychiatry: Judgement and insight appear normal. Mood & affect appropriate.   VITALS:  height is 5\' 3"  (1.6 m) and weight is 152 lb 1.9 oz (69 kg). Her oral temperature is 98.2 F (36.8 C). Her blood pressure is 109/63 and her pulse is 89. Her respiration is 12 and oxygen saturation is 94%.   I personally reviewed Labs under Results section.  Radiology Reports No results found.     Assessment/Plan:  S/o ETOH abuse with intoxication Slight increased risk for DT's VS stable critical illness resolved  Transfer to gen med floor     Judith Drake, M.D.  [facility]  Medical Director [place] Director [facility] Cardio-Pulmonary Department
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
## 2018-04-12 NOTE — Progress Notes (Signed)
Hypoglycemic Event  CBG: 69  Treatment: 4-6oz of orange juice  Symptoms: N/A  Follow-up CBG: Time: 15 mins CBG Result: 103  Possible Reasons for Event: Limited PO intake  Comments/MD notified:N/A hypoglycemic protocol followed    Judith Drake

## 2018-04-12 NOTE — Progress Notes (Signed)
Pt complaining of a slight headache and being somewhat anxious. 1mg  PRN Ativan given. Later pt complaining of pain 7/10, Elink MD notified, order placed for Toradol, administered. Will continue to monitor. Pt son at bedside throughout night.
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
## 2018-04-13 LAB — BASIC METABOLIC PANEL
ANION GAP: 8 (ref 5–15)
BUN: 7 mg/dL (ref 6–20)
CO2: 30 mmol/L (ref 22–32)
Calcium: 8.9 mg/dL (ref 8.9–10.3)
Chloride: 101 mmol/L (ref 98–111)
Creatinine, Ser: 0.49 mg/dL (ref 0.44–1.00)
GFR calc Af Amer: >60 mL/min (ref >60)
Glucose, Bld: 94 mg/dL (ref 70–99)
Potassium: 3.6 mmol/L (ref 3.5–5.1)
SODIUM: 139 mmol/L (ref 135–145)

## 2018-04-13 LAB — GLUCOSE, CAPILLARY: Glucose-Capillary: 92 mg/dL (ref 70–99)

## 2018-04-13 LAB — TRIGLYCERIDES: TRIGLYCERIDES: 151 mg/dL — AB (ref <150)

## 2018-04-13 MED ORDER — ACETAMINOPHEN 325 MG PO TABS: 650.0000 mg | ORAL_TABLET | Freq: Four times a day (QID) | ORAL | Status: DC | PRN

## 2018-04-13 MED ORDER — ADULT MULTIVITAMIN W/MINERALS CH: 1.0000 | ORAL_TABLET | Freq: Every day | ORAL | Status: DC

## 2018-04-13 MED ORDER — FOLIC ACID 1 MG PO TABS: 1.0000 mg | ORAL_TABLET | Freq: Every day | ORAL | 0 refills | Status: DC

## 2018-04-13 MED ORDER — THIAMINE HCL 100 MG PO TABS: 100.0000 mg | ORAL_TABLET | Freq: Every day | ORAL | Status: DC
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
## 2018-04-13 NOTE — Care Management (Signed)
Discharge to home today per Dr. Margaretmary Eddy. Spoke with Ms. Mosely at the bedside about home health services. Declining services at this time. Family will transport. Shelbie Ammons RN MSN CCM Care Management [phone]

## 2018-04-13 NOTE — Progress Notes (Signed)
Discharge instructions along with home medications and follow up gone over with patient. She verbalized that she understood instructions.  IV and tele removed. Pt being discharged home on room air, no distress noted. Ammie Dalton, RN

## 2018-04-13 NOTE — Discharge Instructions (Signed)
Follow-up with primary care physician in 2 to 3 days Follow-up with [facility] in 3 to 4 days Outpatient [facility]

## 2018-04-20 ENCOUNTER — Ambulatory Visit: Payer: BLUE CROSS/BLUE SHIELD | Admitting: Family

## 2018-04-20 ENCOUNTER — Encounter: Payer: Self-pay | Admitting: Family

## 2018-04-20 VITALS — BP 124/84 | HR 86 | Temp 98.8°F | Resp 15 | Wt 149.1 lb

## 2018-04-20 DIAGNOSIS — F411 Generalized anxiety disorder: Secondary | ICD-10-CM | POA: Diagnosis not present

## 2018-04-20 DIAGNOSIS — S0101XA Laceration without foreign body of scalp, initial encounter: Secondary | ICD-10-CM | POA: Insufficient documentation

## 2018-04-20 DIAGNOSIS — S0101XD Laceration without foreign body of scalp, subsequent encounter: Secondary | ICD-10-CM | POA: Diagnosis not present

## 2018-04-20 DIAGNOSIS — F1010 Alcohol abuse, uncomplicated: Secondary | ICD-10-CM

## 2018-04-20 DIAGNOSIS — I10 Essential (primary) hypertension: Secondary | ICD-10-CM

## 2018-04-20 MED ORDER — AMLODIPINE BESYLATE 10 MG PO TABS: 10.0000 mg | ORAL_TABLET | Freq: Every day | ORAL | 3 refills | Status: DC

## 2018-04-20 NOTE — Assessment & Plan Note (Signed)
Controlled on 10mg  amlodipine. Refilled.

## 2018-04-20 NOTE — Progress Notes (Signed)
Subjective:    Patient ID: Judith Drake, female    DOB: 1970-12-28, 47 y.o.   MRN: [id number]  CC: Judith Drake is a 47 y.o. female who presents today for follow up.   HPI: Feels well today. No complaints.   Needs staples removed. States 7 of them. No purulent discharge, fever  Regarding laceration; patient states she fell down the stairs. She states no one pushed her down the stairs. Her husband did not push her. States she is safe at home.   On day of admission and fall, she describes that it was not a good day. Grandmother had passed away, caring for autistic son , and argument with husband.   No alcohol use since admission. Doesn't feel like needs AA. Doesn't crave alcohol. Uses it to 'numb'  Accompanied by young son  GAD- compliant with medication. Taking BID and can tell a difference. No si/hi.     HTN- amlodipine 10 mg. No CP. Needs refill.     Patient was admitted 8/3 and discharged 4 days later 8/7.  Admission diagnosis hyperkalemia, laceration of scalp, alcoholic intoxication. Altered mental status, resolved.  Followed CIWA protocol during auscultation.  Advised to stay on thiamine, folate.  Follow with [facility] and [facility].  Acute respiratory failure, resolved.  Hypertension, resume amlodipine on discharge.  Anxiety- buspar. Declined PT CT head and cervical spine showed left parietal scalp hematoma, no skull fracture, no intra-cranial abnormality.  Mucosal thickening of paranasal sinuses may be related to intubation.  no change in cervical spine without acute fracture CT chest acute findings in the chest, abdomen or pelvis. Normal BMP 04/13/18 Hemogloblin 11 ( fgrom 10.4) Alcohol 197 at admission No notes regarding removal of staples.   HISTORY:  Past Medical History:  Diagnosis Date  . Anemia   . ETOH abuse   . Fibroid   . GERD (gastroesophageal reflux disease)   . Heavy menstrual period   . Hypertension    Past Surgical History:  Procedure  Laterality Date  . ELBOW SURGERY     1997   . TUBAL LIGATION     Family History  Problem Relation Age of Onset  . Cancer Mother 16       Lung   . Hyperlipidemia Mother   . Hypertension Mother   . Stroke Mother   . Kidney disease Mother   . Diabetes Mother   . Heart disease Mother   . Hyperlipidemia Father   . Autism Son   . Cancer Paternal Grandfather 50       Colon Cancer - died  . Breast cancer Neg Hx     Allergies: Amoxicillin Current Outpatient Medications on File Prior to Visit  Medication Sig Dispense Refill  . acetaminophen (TYLENOL) 325 MG tablet Take 2 tablets (650 mg total) by mouth every 6 (six) hours as needed for mild pain (or Fever >/= 101).    . busPIRone (BUSPAR) 10 MG tablet Take 1 tablet (10 mg total) by mouth 3 (three) times daily. 90 tablet 3  . CVS VITAMIN B12 1000 MCG tablet TAKE 1 TABLET BY MOUTH EVERY DAY 90 tablet 0  . ferrous sulfate 325 (65 FE) MG tablet TAKE 1 TABLET (325 MG TOTAL) BY MOUTH 2 (TWO) TIMES DAILY WITH A MEAL. 60 tablet 2  . gabapentin (NEURONTIN) 100 MG capsule Take 2 capsules (200 mg total) by mouth at bedtime. 60 capsule 3  . Multiple Vitamin (MULTIVITAMIN WITH MINERALS) TABS tablet Take 1 tablet by mouth  daily.    . thiamine 100 MG tablet Take 1 tablet (100 mg total) by mouth daily.    . folic acid (FOLVITE) 1 MG tablet Take 1 tablet (1 mg total) by mouth daily. (Patient not taking: Reported on 04/20/2018) 30 tablet 0   No current facility-administered medications on file prior to visit.     Social History   Tobacco Use  . Smoking status: Light Tobacco Smoker    Types: Cigarettes  . Smokeless tobacco: Current User  Substance Use Topics  . Alcohol use: Yes  . Drug use: No    Review of Systems  Constitutional: Negative for chills and fever.  Respiratory: Negative for cough.   Cardiovascular: Negative for chest pain and palpitations.  Gastrointestinal: Negative for nausea and vomiting.  Skin: Positive for wound.    Neurological: Negative for tremors, seizures and headaches.  Psychiatric/Behavioral: Negative for suicidal ideas. The patient is nervous/anxious.       Objective:    BP 124/84 (BP Location: Left Arm, Patient Position: Sitting, Cuff Size: Normal)   Pulse 86   Temp 98.8 F (37.1 C) (Oral)   Resp 15   Wt 149 lb 2 oz (67.6 kg)   LMP 04/09/2018   SpO2 99%   BMI 26.42 kg/m  BP Readings from Last 3 Encounters:  04/20/18 124/84  04/12/18 128/86  03/28/18 126/82   Wt Readings from Last 3 Encounters:  04/20/18 149 lb 2 oz (67.6 kg)  04/13/18 150 lb 1.6 oz (68.1 kg)  03/28/18 152 lb (68.9 kg)    Physical Exam  Constitutional: She appears well-developed and well-nourished.  HENT:  Head: Head is with laceration.    7 staples removed. Laceration well approximated. Scant dried blood. No purulent discharge, increase in warmth appreciated  Eyes: Conjunctivae are normal.  Cardiovascular: Normal rate, regular rhythm, normal heart sounds and normal pulses.  Pulmonary/Chest: Effort normal and breath sounds normal. She has no wheezes. She has no rhonchi. She has no rales.  Neurological: She is alert.  Skin: Skin is warm and dry.  Psychiatric: She has a normal mood and affect. Her speech is normal and behavior is normal. Thought content normal.  Vitals reviewed.      Assessment & Plan:   Problem List Items Addressed This Visit      Cardiovascular and Mediastinum   HTN (hypertension)    Controlled on 10mg  amlodipine. Refilled.       Relevant Medications   amLODipine (NORVASC) 10 MG tablet     Other   Alcohol abuse    Reviewed hospitalization with patient. She has not drank alcohol since. We discussed alcohol being a coping mechanism and she was open to counseling. Referral placed.       Relevant Orders   Ambulatory referral to Psychology   GAD (generalized anxiety disorder)    Some improvement. Discussed trial of increase buspar from bid to tid due to alcohol use. Referral  to counseling as well. Close follow up.       Relevant Orders   Ambulatory referral to Psychology   Laceration of scalp - Primary    Staples removed without difficulty. Patient tolerated well. No active bleeding nor signs of infection.           I have discontinued [name]. Wildes's amLODipine. I am also having her start on amLODipine. Additionally, I am having her maintain her gabapentin, busPIRone, ferrous sulfate, CVS VITAMIN B12, acetaminophen, multivitamin with minerals, thiamine, and folic acid.   Meds ordered this  encounter  Medications  . amLODipine (NORVASC) 10 MG tablet    Sig: Take 1 tablet (10 mg total) by mouth daily.    Dispense:  90 tablet    Refill:  3    Order Specific Question:   Supervising Provider    Answer:   Crecencio Mc [[id number]]    Return precautions given.   Risks, benefits, and alternatives of the medications and treatment plan prescribed today were discussed, and patient expressed understanding.   Education regarding symptom management and diagnosis given to patient on AVS.  Continue to follow with Burnard Hawthorne, FNP for routine health maintenance.   Regan Lemming and I agreed with plan.   Mable Paris, FNP

## 2018-04-20 NOTE — Patient Instructions (Signed)
   Take buspar three times per day  Today we discussed referrals, orders. Counseling   I have placed these orders in the system for you.  Please be sure to give [place] a call if you have not heard from our office regarding scheduling a test or regarding referral in a timely manner.  It is very important that you let me know as soon as possible.

## 2018-04-20 NOTE — Assessment & Plan Note (Signed)
Some improvement. Discussed trial of increase buspar from bid to tid due to alcohol use. Referral to counseling as well. Close follow up.

## 2018-04-20 NOTE — Assessment & Plan Note (Addendum)
Staples removed without difficulty. Patient tolerated well. No active bleeding nor signs of infection.

## 2018-04-20 NOTE — Assessment & Plan Note (Signed)
Reviewed hospitalization with patient. She has not drank alcohol since. We discussed alcohol being a coping mechanism and she was open to counseling. Referral placed.

## 2018-04-26 ENCOUNTER — Ambulatory Visit: Payer: BLUE CROSS/BLUE SHIELD | Admitting: Family Medicine

## 2018-04-28 ENCOUNTER — Other Ambulatory Visit: Payer: Self-pay | Admitting: Family

## 2018-04-28 DIAGNOSIS — F411 Generalized anxiety disorder: Secondary | ICD-10-CM

## 2018-05-23 ENCOUNTER — Ambulatory Visit: Payer: BLUE CROSS/BLUE SHIELD | Admitting: Family

## 2018-06-08 ENCOUNTER — Ambulatory Visit: Payer: BLUE CROSS/BLUE SHIELD | Admitting: Family

## 2018-06-09 ENCOUNTER — Encounter (HOSPITAL_COMMUNITY): Payer: Self-pay | Admitting: Emergency Medicine

## 2018-06-09 ENCOUNTER — Emergency Department (HOSPITAL_COMMUNITY)
Admission: EM | Admit: 2018-06-09 | Discharge: 2018-06-10 | Disposition: A | Discharge status: Home / Self Care | Payer: BLUE CROSS/BLUE SHIELD | Attending: Emergency Medicine | Admitting: Emergency Medicine

## 2018-06-09 ENCOUNTER — Emergency Department (HOSPITAL_COMMUNITY): Payer: BLUE CROSS/BLUE SHIELD

## 2018-06-09 DIAGNOSIS — Z79899 Other long term (current) drug therapy: Secondary | ICD-10-CM | POA: Insufficient documentation

## 2018-06-09 DIAGNOSIS — E785 Hyperlipidemia, unspecified: Secondary | ICD-10-CM | POA: Diagnosis not present

## 2018-06-09 DIAGNOSIS — I10 Essential (primary) hypertension: Secondary | ICD-10-CM | POA: Diagnosis not present

## 2018-06-09 DIAGNOSIS — F17210 Nicotine dependence, cigarettes, uncomplicated: Secondary | ICD-10-CM | POA: Diagnosis not present

## 2018-06-09 DIAGNOSIS — R0789 Other chest pain: Secondary | ICD-10-CM

## 2018-06-09 DIAGNOSIS — R079 Chest pain, unspecified: Secondary | ICD-10-CM | POA: Diagnosis not present

## 2018-06-09 LAB — CBC
HEMATOCRIT: 45.4 % (ref 36.0–46.0)
HEMOGLOBIN: 15.2 g/dL — AB (ref 12.0–15.0)
MCH: 33.1 pg (ref 26.0–34.0)
MCHC: 33.5 g/dL (ref 30.0–36.0)
MCV: 98.9 fL (ref 78.0–100.0)
Platelets: 257 10*3/uL (ref 150–400)
RBC: 4.59 MIL/uL (ref 3.87–5.11)
RDW: 12.6 % (ref 11.5–15.5)
WBC: 8.8 10*3/uL (ref 4.0–10.5)

## 2018-06-09 LAB — BASIC METABOLIC PANEL
ANION GAP: 14 (ref 5–15)
BUN: 9 mg/dL (ref 6–20)
CHLORIDE: 97 mmol/L — AB (ref 98–111)
CO2: 26 mmol/L (ref 22–32)
Calcium: 10 mg/dL (ref 8.9–10.3)
Creatinine, Ser: 0.68 mg/dL (ref 0.44–1.00)
GFR calc Af Amer: >60 mL/min (ref >60)
GFR calc non Af Amer: >60 mL/min (ref >60)
GLUCOSE: 97 mg/dL (ref 70–99)
POTASSIUM: 2.8 mmol/L — AB (ref 3.5–5.1)
SODIUM: 137 mmol/L (ref 135–145)

## 2018-06-09 LAB — I-STAT BETA HCG BLOOD, ED (MC, WL, AP ONLY): HCG, QUANTITATIVE: 81.1 m[IU]/mL — AB (ref <5)

## 2018-06-09 LAB — I-STAT TROPONIN, ED: Troponin i, poc: 0 ng/mL (ref 0.00–0.08)

## 2018-06-09 LAB — D-DIMER, QUANTITATIVE (NOT AT ARMC): D DIMER QUANT: 0.47 ug{FEU}/mL (ref 0.00–0.50)

## 2018-06-09 LAB — MAGNESIUM: MAGNESIUM: 1.4 mg/dL — AB (ref 1.7–2.4)

## 2018-06-09 LAB — HCG, QUANTITATIVE, PREGNANCY: HCG, BETA CHAIN, QUANT, S: 3 m[IU]/mL (ref <5)

## 2018-06-09 IMAGING — DX DG CHEST 2V
2 series · 2 of 2 positions shown · non-contrast
Comparison: Body CT [DATE]

CLINICAL DATA: Right-sided chest pain.

EXAM:
CHEST - 2 VIEW

[chest pa]
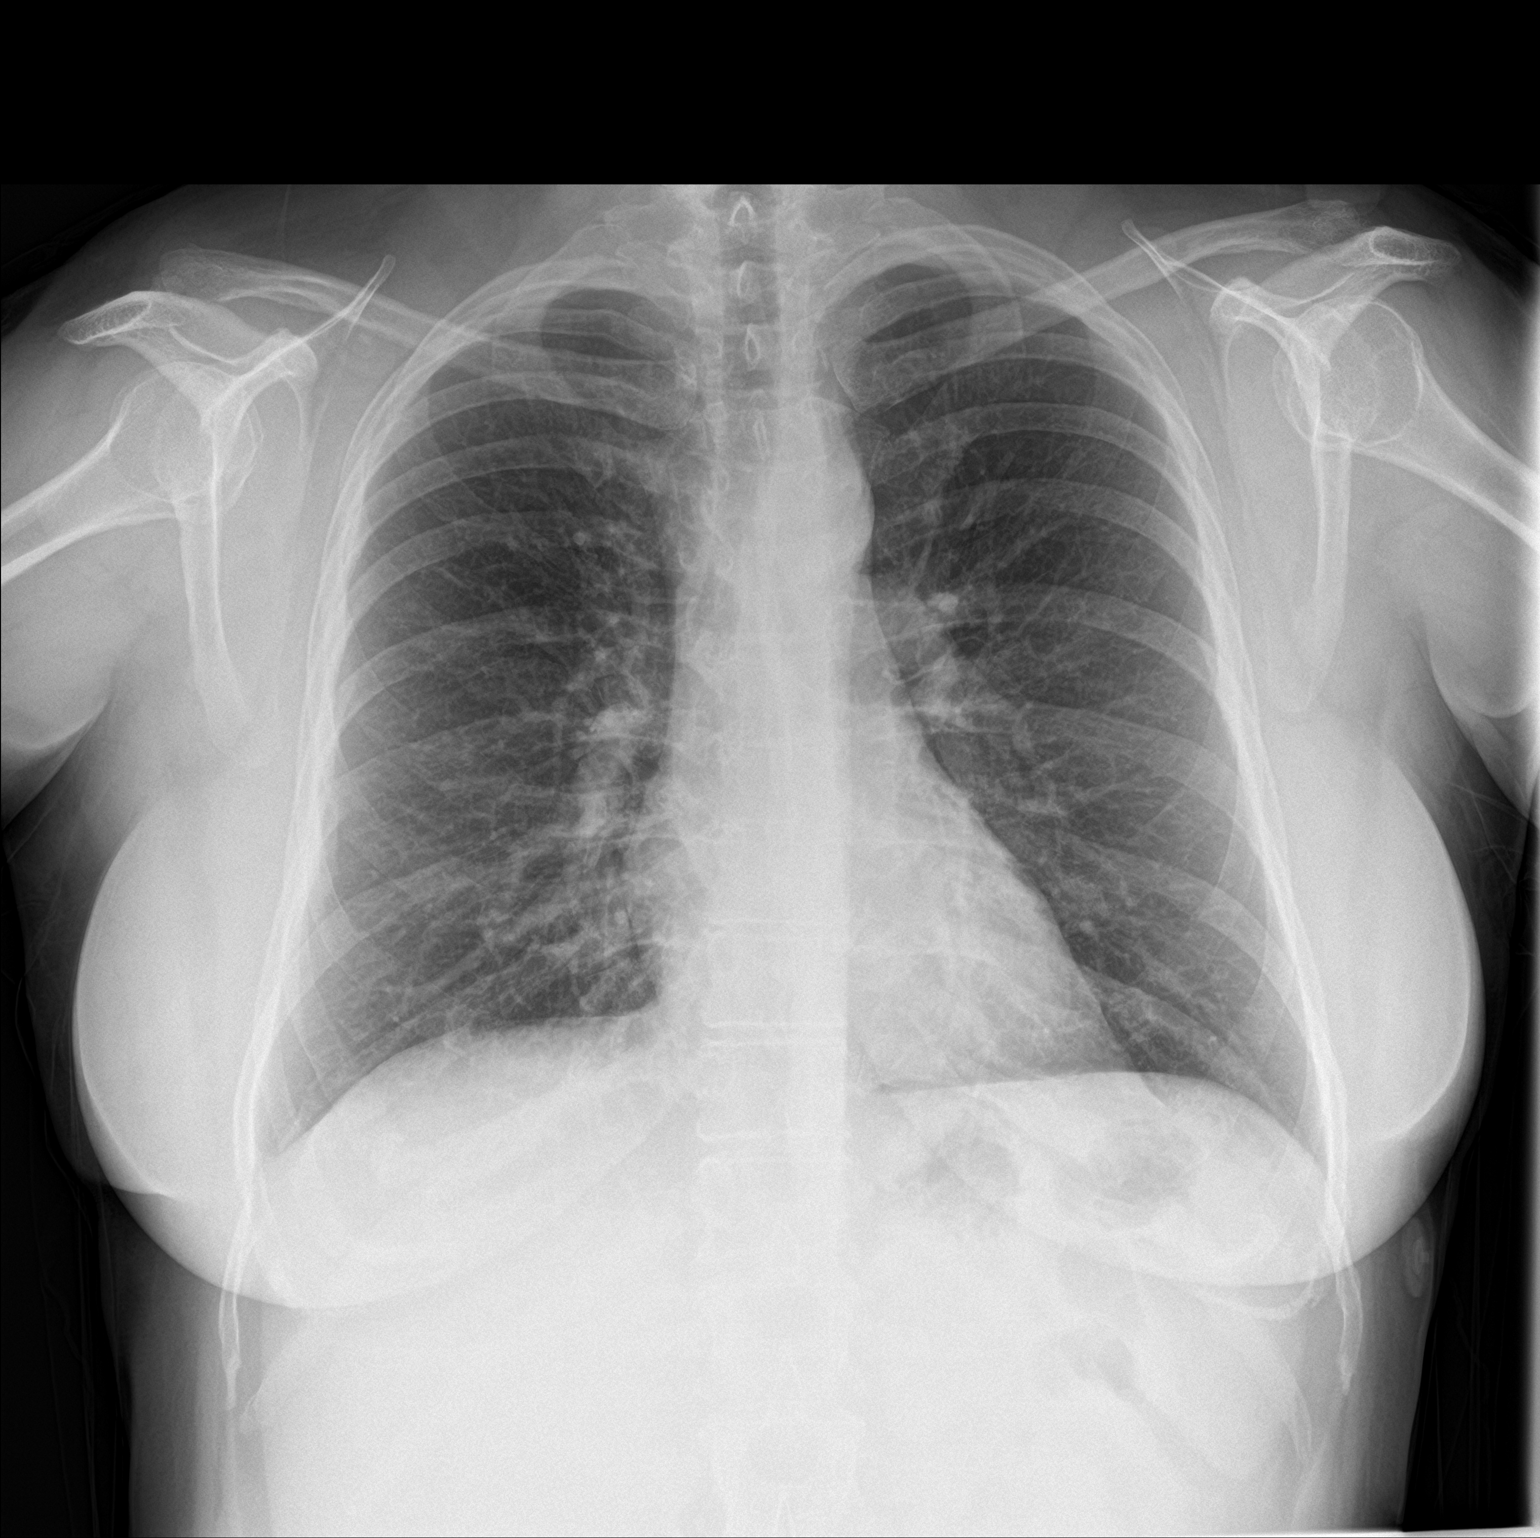

[chest lat]
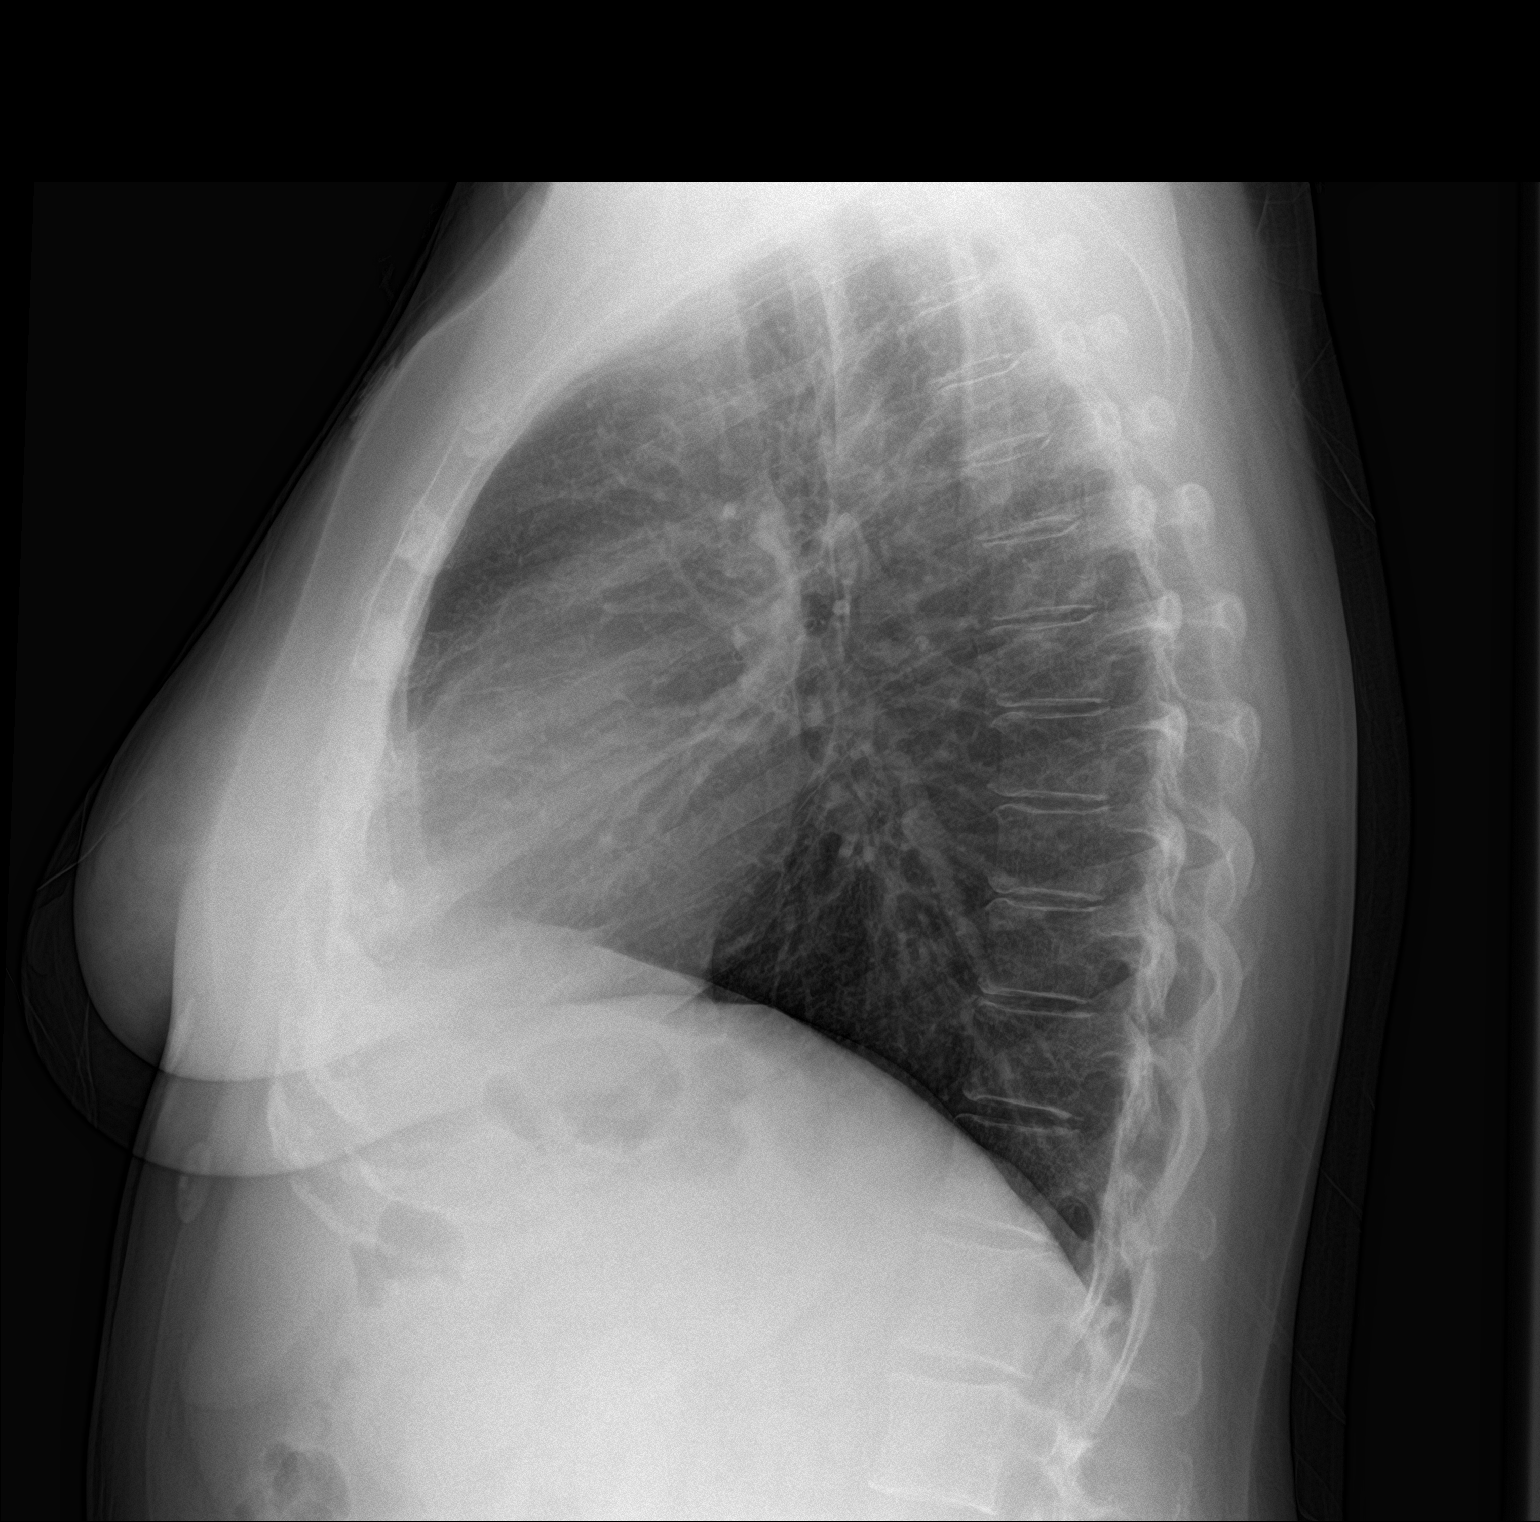

[2 of 2 positions shown; findings below may reference images not displayed]

FINDINGS: Cardiomediastinal silhouette is normal. Mediastinal contours appear
intact.

There is no evidence of focal airspace consolidation, pleural
effusion or pneumothorax.

Osseous structures are without acute abnormality. Soft tissues are
grossly normal.
IMPRESSION: No active cardiopulmonary disease.

## 2018-06-09 MED ORDER — POTASSIUM CHLORIDE 10 MEQ/100ML IV SOLN
10.0000 meq | Freq: Once | INTRAVENOUS | Status: AC
  Administered 2018-06-09: 10 meq via INTRAVENOUS
  Filled 2018-06-09: qty 100

## 2018-06-09 MED ORDER — MAGNESIUM SULFATE 2 GM/50ML IV SOLN
2.0000 g | Freq: Once | INTRAVENOUS | Status: AC
  Administered 2018-06-09: 2 g via INTRAVENOUS
  Filled 2018-06-09: qty 50

## 2018-06-09 MED ORDER — POTASSIUM CHLORIDE CRYS ER 20 MEQ PO TBCR
20.0000 meq | EXTENDED_RELEASE_TABLET | Freq: Once | ORAL | Status: AC
  Administered 2018-06-09: 20 meq via ORAL
  Filled 2018-06-09: qty 1

## 2018-06-09 NOTE — ED Provider Notes (Signed)
[facility] EMERGENCY DEPARTMENT Provider Note   CSN: [id number] Arrival date & time: 06/09/18  1907     History   Chief Complaint Chief Complaint  Patient presents with  . Chest Pain    HPI Judith Drake is a 47 y.o. female.  HPI   47 year old female PMH for HTN, HLD, EtOH abuse, anemia presents with chief complaint of chest pain. Patient states that tillers prior to arrival she had acute onset of right-sided chest pain, intermittent, sharp, lasting several seconds, rating to her right shoulder blade, nothing makes better, nothing makes it worse, not associated with deep breathing.  Denies recent infectious symptoms.  Doris is intermittent lower extremity edema, not currently present.  Denies recent traumas or falls.  Is not on hormone replacement.   Past Medical History:  Diagnosis Date  . Anemia   . ETOH abuse   . Fibroid   . GERD (gastroesophageal reflux disease)   . Heavy menstrual period   . Hypertension     Patient Active Problem List   Diagnosis Date Noted  . Laceration of scalp 04/20/2018  . Acute respiratory failure with hypoxia ([place]) 04/10/2018  . Nonallopathic lesion of sacral region 03/28/2018  . Nonallopathic lesion of thoracic region 03/28/2018  . Nonallopathic lesion of lumbosacral region 03/28/2018  . Nonallopathic lesion of pelvic region 03/28/2018  . Nonallopathic lesion of cervical region 03/28/2018  . Enlarged thyroid 03/07/2018  . 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 Lumbar radiculopathy, right 02/08/2018  . GAD (generalized anxiety disorder) 01/03/2018  . Rash 01/03/2018  . Right hip pain 01/03/2018  . GERD (gastroesophageal reflux disease) 12/13/2017  . Heavy menstrual period 12/13/2017  . Hypokalemia 12/13/2017  . Fibroid uterus 12/13/2017  . Elevated liver enzymes 11/24/2013  . Depression 11/24/2013  . Fatigue 07/12/2013  . Abnormal laboratory test 07/12/2013  . Symptomatic anemia 07/12/2013  . HTN  (hypertension) 07/12/2013  . Alcohol abuse 07/12/2013  . Tobacco abuse 07/12/2013  . Yeast infection of the skin 07/12/2013  . Routine general medical examination at a health care facility 07/12/2013    Past Surgical History:  Procedure Laterality Date  . ELBOW SURGERY     1997   . TUBAL LIGATION       OB History   None      Home Medications    Prior to Admission medications   Medication Sig Start Date End Date Taking? Authorizing Provider  amLODipine (NORVASC) 10 MG tablet Take 1 tablet (10 mg total) by mouth daily. 04/20/18  Yes Arnett, Yvetta Coder, FNP  busPIRone (BUSPAR) 10 MG tablet TAKE 1 TABLET BY MOUTH THREE TIMES A DAY Patient taking differently: Take 10 mg by mouth 3 (three) times daily.  04/28/18  Yes Burnard Hawthorne, FNP  cholecalciferol (VITAMIN D) 1000 units tablet Take 1,000 Units by mouth daily.   Yes [provider]  CVS VITAMIN B12 1000 MCG tablet TAKE 1 TABLET BY MOUTH EVERY DAY 04/12/18  Yes Burnard Hawthorne, FNP  ferrous sulfate 325 (65 FE) MG tablet TAKE 1 TABLET (325 MG TOTAL) BY MOUTH 2 (TWO) TIMES DAILY WITH A MEAL. 04/11/18  Yes Arnett, Yvetta Coder, FNP  vitamin C (ASCORBIC ACID) 500 MG tablet Take 500 mg by mouth daily.   Yes [provider]  acetaminophen (TYLENOL) 325 MG tablet Take 2 tablets (650 mg total) by mouth every 6 (six) hours as needed for mild pain (or Fever >/= 101). Patient not taking: Reported on 06/09/2018 04/13/18  Nicholes Mango, MD  folic acid (FOLVITE) 1 MG tablet Take 1 tablet (1 mg total) by mouth daily. Patient not taking: Reported on 04/20/2018 04/13/18   Nicholes Mango, MD  gabapentin (NEURONTIN) 100 MG capsule Take 2 capsules (200 mg total) by mouth at bedtime. Patient not taking: Reported on 06/09/2018 02/08/18   Lyndal Pulley, DO  Multiple Vitamin (MULTIVITAMIN WITH MINERALS) TABS tablet Take 1 tablet by mouth daily. Patient not taking: Reported on 06/09/2018 04/13/18   Nicholes Mango, MD  thiamine 100 MG tablet Take 1  tablet (100 mg total) by mouth daily. Patient not taking: Reported on 06/09/2018 04/13/18   Nicholes Mango, MD    Family History Family History  Problem Relation Age of Onset  . Cancer Mother 24       Lung   . Hyperlipidemia Mother   . Hypertension Mother   . Stroke Mother   . Kidney disease Mother   . Diabetes Mother   . Heart disease Mother   . Hyperlipidemia Father   . Autism Son   . Cancer Paternal Grandfather 57       Colon Cancer - died  . Breast cancer Neg Hx     Social History Social History   Tobacco Use  . Smoking status: Light Tobacco Smoker    Types: Cigarettes  . Smokeless tobacco: Current User  Substance Use Topics  . Alcohol use: Yes  . Drug use: No     Allergies   Amoxicillin   Review of Systems Review of Systems  Constitutional: Positive for diaphoresis. Negative for chills and fever.  HENT: Negative for ear pain and sore throat.   Eyes: Negative for pain and visual disturbance.  Respiratory: Negative for cough and shortness of breath.   Cardiovascular: Positive for chest pain and leg swelling. Negative for palpitations.  Gastrointestinal: Positive for nausea. Negative for abdominal pain and vomiting.  Genitourinary: Negative for dysuria and hematuria.  Musculoskeletal: Negative for arthralgias and back pain.  Skin: Negative for color change and rash.  Neurological: Negative for seizures and syncope.  All other systems reviewed and are negative.    Physical Exam Updated Vital Signs BP (!) 142/90   Pulse 83   Temp 98.5 F (36.9 C) (Oral)   Resp 15   Ht 5\' 4"  (1.626 m)   Wt 68 kg   LMP 05/26/2018 (Approximate)   SpO2 97%   BMI 25.75 kg/m   Physical Exam  Constitutional: She appears well-developed and well-nourished. No distress.  HENT:  Head: Normocephalic and atraumatic.  Eyes: Pupils are equal, round, and reactive to light. Conjunctivae and EOM are normal.  Neck: Neck supple.  Cardiovascular: Normal rate and regular rhythm.  No  murmur heard. Pulmonary/Chest: Effort normal and breath sounds normal. No respiratory distress.    Abdominal: Soft. There is no tenderness. There is no rigidity, no rebound, no guarding, no CVA tenderness, no tenderness at McBurney's point and negative Murphy's sign.  Musculoskeletal: She exhibits no edema.       Right lower leg: She exhibits no edema.       Left lower leg: She exhibits no edema.  Neurological: She is alert.  Skin: Skin is warm and dry.  Psychiatric: She has a normal mood and affect.  Nursing note and vitals reviewed.    ED Treatments / Results  Labs (all labs ordered are listed, but only abnormal results are displayed) Labs Reviewed  BASIC METABOLIC PANEL - Abnormal; Notable for the following components:  Result Value   Potassium 2.8 (*)    Chloride 97 (*)    All other components within normal limits  CBC - Abnormal; Notable for the following components:   Hemoglobin 15.2 (*)    All other components within normal limits  MAGNESIUM - Abnormal; Notable for the following components:   Magnesium 1.4 (*)    All other components within normal limits  I-STAT BETA HCG BLOOD, ED ([facility], [facility], [facility] ONLY) - Abnormal; Notable for the following components:   I-stat hCG, quantitative 81.1 (*)    All other components within normal limits  HCG, QUANTITATIVE, PREGNANCY  D-DIMER, QUANTITATIVE (NOT AT [facility])  I-STAT TROPONIN, ED    EKG EKG Interpretation  Date/Time:  Thursday June 09 2018 19:17:11 EDT Ventricular Rate:  97 PR Interval:  130 QRS Duration: 88 QT Interval:  382 QTC Calculation: 485 R Axis:   25 Text Interpretation:  Normal sinus rhythm Prolonged QT Abnormal ECG No significant change since last tracing Confirmed by Isla Pence [phone]) on 06/09/2018 9:17:36 PM   Radiology Dg Chest 2 View  Result Date: 06/09/2018 CLINICAL DATA:  Right-sided chest pain. EXAM: CHEST - 2 VIEW COMPARISON:  Body CT 04/10/2018 FINDINGS: Cardiomediastinal silhouette is  normal. Mediastinal contours appear intact. There is no evidence of focal airspace consolidation, pleural effusion or pneumothorax. Osseous structures are without acute abnormality. Soft tissues are grossly normal. IMPRESSION: No active cardiopulmonary disease. Electronically Signed   By: Fidela Salisbury M.D.   On: 06/09/2018 19:44    Procedures Procedures (including critical care time)  Medications Ordered in ED Medications  magnesium sulfate IVPB 2 g 50 mL (has no administration in time range)  potassium chloride 10 mEq in 100 mL IVPB (0 mEq Intravenous Stopped 06/09/18 2241)  potassium chloride SA (K-DUR,KLOR-CON) CR tablet 20 mEq (20 mEq Oral Given 06/09/18 2141)     Initial Impression / Assessment and Plan / ED Course  I have reviewed the triage vital signs and the nursing notes.  Pertinent labs & imaging results that were available during my care of the patient were reviewed by me and considered in my medical decision making (see chart for details).     47 year old female PMH for HTN, HLD, EtOH abuse, anemia presents with chief complaint of chest pain.  History as above.  DDX includes muscle skeletal pain versus ACS versus PE.  Heart pathway score 2.  Labs and imaging performed.  Reveal K2.8, given IV and p.o. potassium, mag 4, replaced with IV mag 2 g, initial troponin negative.  Due to onset of symptoms greater than 12 hours ago, delta troponin is not necessary at this time.  D-dimer negative  XR negative for acute abnormality.  EKG shows regular rate and rhythm, normal axis, QTC mildly prolonged at 485, no signs of ST elevation or ST deviation.  No TWA.  I-STAT hCG 81, quant hCG negative.  Patient with low hear score, negative troponin, negative d-dimer, negative ACS work-up.  Patient with likely muscular skeletal pain.  Given ibuprofen.  Advise follow-up with PCP for recurrence of symptoms.  Patient seen conduction with my attending Dr. Gilford Raid who agrees with plan of  disposition.   Final Clinical Impressions(s) / ED Diagnoses   Final diagnoses:  Chest wall pain    ED Discharge Orders    None       Keenan Bachelor, MD 06/09/18 6045    Isla Pence, MD 06/09/18 [phone]

## 2018-06-09 NOTE — ED Notes (Signed)
ED Provider at bedside. 

## 2018-06-09 NOTE — ED Triage Notes (Signed)
Patient reports intermittent mid/right chest pain onset this morning with mild nausea and diaphoresis , denies SOB , no chest pain at triage , denies cough or congestion .

## 2018-07-27 ENCOUNTER — Other Ambulatory Visit: Payer: Self-pay | Admitting: Family

## 2018-07-27 NOTE — Telephone Encounter (Signed)
Copied from [place] [id number]. Topic: General - Other >> Jul 27, 2018  2:56 PM Janace Aris A wrote: Medication: ferrous sulfate 325 (65 FE) MG tablet, CVS VITAMIN B12 1000 MCG tablet  Has the patient contacted their pharmacy? Yes  Preferred Pharmacy (with phone number or street name): [id number] [address], [place]  [phone] (Phone) [phone] (Fax)   Agent: Please be advised that RX refills may take up to 3 business days. We ask that you follow-up with your pharmacy.

## 2018-07-28 MED ORDER — FERROUS SULFATE 325 (65 FE) MG PO TABS: 325.0000 mg | ORAL_TABLET | Freq: Two times a day (BID) | ORAL | 2 refills | Status: DC

## 2018-07-28 NOTE — Telephone Encounter (Signed)
Requested medication (s) are due for refill today: yes  Requested medication (s) are on the active medication list: yes    Last refill: 04/12/18  #90  0 refills  Future visit scheduled yes  12 /9 /19  Notes to clinic:off protocol  Requested Prescriptions  Pending Prescriptions Disp Refills   cyanocobalamin (CVS VITAMIN B12) 1000 MCG tablet 90 tablet 0    Sig: Take 1 tablet (1,000 mcg total) by mouth daily.     Off-Protocol Failed - 07/28/2018  9:46 AM      Failed - Medication not assigned to a protocol, review manually.      Passed - Valid encounter within last 12 months    Recent Outpatient Visits          3 months ago Laceration of scalp, subsequent encounter   [place], Yvetta Coder, FNP   4 months ago Arthritis of right sacroiliac joint   [place], [place], DO   4 months ago Essential hypertension    [place], Yvetta Coder, FNP   5 months ago Right hip pain   [place], [place], DO   5 months ago Pain of both hip joints   [place], [place], DO      Future Appointments            In 2 weeks Arnett, Yvetta Coder, FNP [place], [facility]         Signed Prescriptions Disp Refills   ferrous sulfate 325 (65 FE) MG tablet 60 tablet 2    Sig: Take 1 tablet (325 mg total) by mouth 2 (two) times daily with a meal.     Endocrinology:  Minerals - Iron Supplementation Failed - 07/28/2018  9:46 AM      Failed - HGB in normal range and within 360 days    Hemoglobin  Date Value Ref Range Status  06/09/2018 15.2 (H) 12.0 - 15.0 g/dL Final   HGB  Date Value Ref Range Status  08/16/2013 12.0 12.0 - 16.0 g/dL Final         Failed - Ferritin in normal range and within 360 days    Ferritin  Date Value Ref Range Status  12/13/2017 6 (L) 11 - 307 ng/mL Final    Comment:    Performed at [facility], [place] [address], [place], [place] [address]         Passed - HCT in normal range and within 360 days    HCT  Date Value Ref Range Status  06/09/2018 45.4 36.0 - 46.0 % Final  08/16/2013 37.7 35.0 - 47.0 % Final         Passed - RBC in normal range and within 360 days    RBC  Date Value Ref Range Status  06/09/2018 4.59 3.87 - 5.11 MIL/uL Final         Passed - Fe (serum) in normal range and within 360 days    Iron  Date Value Ref Range Status  02/15/2018 86 42 - 145 ug/dL Final   Saturation Ratios  Date Value Ref Range Status  02/15/2018 16.9 (L) 20.0 - 50.0 % Final         Passed - Valid encounter within last 12 months    Recent Outpatient Visits          3 months ago Laceration of scalp, subsequent  encounter   [facility] Arnett, Yvetta Coder, FNP   4 months ago Arthritis of right sacroiliac joint   [place], [place], DO   4 months ago Essential hypertension   [place], Yvetta Coder, FNP   5 months ago Right hip pain   [place], [place], DO   5 months ago Pain of both hip joints   [place], [place], DO      Future Appointments            In 2 weeks Arnett, Yvetta Coder, [place], [facility]

## 2018-07-29 MED ORDER — CYANOCOBALAMIN 1000 MCG PO TABS: 1000.0000 ug | ORAL_TABLET | Freq: Every day | ORAL | 1 refills | Status: DC

## 2018-08-15 ENCOUNTER — Ambulatory Visit: Payer: BLUE CROSS/BLUE SHIELD | Admitting: Family

## 2018-09-19 ENCOUNTER — Ambulatory Visit: Payer: BLUE CROSS/BLUE SHIELD | Admitting: Family

## 2018-10-22 ENCOUNTER — Other Ambulatory Visit: Payer: Self-pay | Admitting: Family

## 2019-03-17 ENCOUNTER — Other Ambulatory Visit: Payer: Self-pay

## 2019-03-17 ENCOUNTER — Encounter (HOSPITAL_COMMUNITY): Payer: Self-pay

## 2019-03-17 ENCOUNTER — Inpatient Hospital Stay (HOSPITAL_COMMUNITY)
Admission: EM | Admit: 2019-03-17 | Discharge: 2019-03-21 | DRG: 760 | Disposition: A | Discharge status: Home / Self Care | Payer: Self-pay | Attending: Nephrology | Admitting: Nephrology

## 2019-03-17 DIAGNOSIS — Z20828 Contact with and (suspected) exposure to other viral communicable diseases: Secondary | ICD-10-CM | POA: Diagnosis present

## 2019-03-17 DIAGNOSIS — D259 Leiomyoma of uterus, unspecified: Secondary | ICD-10-CM | POA: Diagnosis present

## 2019-03-17 DIAGNOSIS — F1090 Alcohol use, unspecified, uncomplicated: Secondary | ICD-10-CM | POA: Diagnosis present

## 2019-03-17 DIAGNOSIS — D649 Anemia, unspecified: Secondary | ICD-10-CM | POA: Diagnosis present

## 2019-03-17 DIAGNOSIS — R402362 Coma scale, best motor response, obeys commands, at arrival to emergency department: Secondary | ICD-10-CM | POA: Diagnosis present

## 2019-03-17 DIAGNOSIS — F17210 Nicotine dependence, cigarettes, uncomplicated: Secondary | ICD-10-CM | POA: Diagnosis present

## 2019-03-17 DIAGNOSIS — R402142 Coma scale, eyes open, spontaneous, at arrival to emergency department: Secondary | ICD-10-CM | POA: Diagnosis present

## 2019-03-17 DIAGNOSIS — F32A Depression, unspecified: Secondary | ICD-10-CM | POA: Diagnosis present

## 2019-03-17 DIAGNOSIS — F411 Generalized anxiety disorder: Secondary | ICD-10-CM | POA: Diagnosis present

## 2019-03-17 DIAGNOSIS — Z880 Allergy status to penicillin: Secondary | ICD-10-CM

## 2019-03-17 DIAGNOSIS — N939 Abnormal uterine and vaginal bleeding, unspecified: Secondary | ICD-10-CM | POA: Diagnosis present

## 2019-03-17 DIAGNOSIS — F329 Major depressive disorder, single episode, unspecified: Secondary | ICD-10-CM | POA: Diagnosis present

## 2019-03-17 DIAGNOSIS — R402252 Coma scale, best verbal response, oriented, at arrival to emergency department: Secondary | ICD-10-CM | POA: Diagnosis present

## 2019-03-17 DIAGNOSIS — I4581 Long QT syndrome: Secondary | ICD-10-CM | POA: Diagnosis present

## 2019-03-17 DIAGNOSIS — N946 Dysmenorrhea, unspecified: Secondary | ICD-10-CM | POA: Diagnosis present

## 2019-03-17 DIAGNOSIS — I10 Essential (primary) hypertension: Secondary | ICD-10-CM | POA: Diagnosis present

## 2019-03-17 DIAGNOSIS — F1010 Alcohol abuse, uncomplicated: Secondary | ICD-10-CM | POA: Diagnosis present

## 2019-03-17 DIAGNOSIS — Z597 Insufficient social insurance and welfare support: Secondary | ICD-10-CM

## 2019-03-17 DIAGNOSIS — Z9851 Tubal ligation status: Secondary | ICD-10-CM

## 2019-03-17 DIAGNOSIS — Z720 Tobacco use: Secondary | ICD-10-CM | POA: Diagnosis present

## 2019-03-17 DIAGNOSIS — D62 Acute posthemorrhagic anemia: Secondary | ICD-10-CM | POA: Diagnosis present

## 2019-03-17 DIAGNOSIS — N920 Excessive and frequent menstruation with regular cycle: Principal | ICD-10-CM | POA: Diagnosis present

## 2019-03-17 DIAGNOSIS — R9389 Abnormal findings on diagnostic imaging of other specified body structures: Secondary | ICD-10-CM | POA: Diagnosis present

## 2019-03-17 DIAGNOSIS — E876 Hypokalemia: Secondary | ICD-10-CM | POA: Diagnosis present

## 2019-03-17 DIAGNOSIS — Z79899 Other long term (current) drug therapy: Secondary | ICD-10-CM

## 2019-03-17 NOTE — ED Triage Notes (Signed)
Per EMS, Pt is coming from home. Pt states she has had vaginal bleeding for the last three months. Pt admits to ETOH use tonight, did not tell how much to EMS. Pt states she is passing softballs sized clots. Hx of htn.

## 2019-03-18 ENCOUNTER — Emergency Department (HOSPITAL_COMMUNITY): Payer: Self-pay

## 2019-03-18 DIAGNOSIS — D649 Anemia, unspecified: Secondary | ICD-10-CM

## 2019-03-18 DIAGNOSIS — F1010 Alcohol abuse, uncomplicated: Secondary | ICD-10-CM

## 2019-03-18 DIAGNOSIS — N921 Excessive and frequent menstruation with irregular cycle: Secondary | ICD-10-CM

## 2019-03-18 DIAGNOSIS — I10 Essential (primary) hypertension: Secondary | ICD-10-CM

## 2019-03-18 DIAGNOSIS — Z720 Tobacco use: Secondary | ICD-10-CM

## 2019-03-18 DIAGNOSIS — E876 Hypokalemia: Secondary | ICD-10-CM

## 2019-03-18 LAB — CBC WITH DIFFERENTIAL/PLATELET
Abs Immature Granulocytes: 0.05 10*3/uL (ref 0.00–0.07)
Basophils Absolute: 0.1 10*3/uL (ref 0.0–0.1)
Basophils Relative: 1 %
Eosinophils Absolute: 0.3 10*3/uL (ref 0.0–0.5)
Eosinophils Relative: 3 %
HCT: 22.2 % — ABNORMAL LOW (ref 36.0–46.0)
Hemoglobin: 6.9 g/dL — CL (ref 12.0–15.0)
Immature Granulocytes: 1 %
Lymphocytes Relative: 29 %
Lymphs Abs: 3.1 10*3/uL (ref 0.7–4.0)
MCH: 30 pg (ref 26.0–34.0)
MCHC: 31.1 g/dL (ref 30.0–36.0)
MCV: 96.5 fL (ref 80.0–100.0)
Monocytes Absolute: 0.6 10*3/uL (ref 0.1–1.0)
Monocytes Relative: 6 %
Neutro Abs: 6.4 10*3/uL (ref 1.7–7.7)
Neutrophils Relative %: 60 %
Platelets: 308 10*3/uL (ref 150–400)
RBC: 2.3 MIL/uL — ABNORMAL LOW (ref 3.87–5.11)
RDW: 14.2 % (ref 11.5–15.5)
WBC: 10.5 10*3/uL (ref 4.0–10.5)
nRBC: 0 % (ref 0.0–0.2)

## 2019-03-18 LAB — URINALYSIS, ROUTINE W REFLEX MICROSCOPIC
Bilirubin Urine: NEGATIVE
Glucose, UA: NEGATIVE mg/dL
Hgb urine dipstick: NEGATIVE
Ketones, ur: NEGATIVE mg/dL
Leukocytes,Ua: NEGATIVE
Nitrite: NEGATIVE
Protein, ur: NEGATIVE mg/dL
Specific Gravity, Urine: 1.004 — ABNORMAL LOW (ref 1.005–1.030)
pH: 6 (ref 5.0–8.0)

## 2019-03-18 LAB — BASIC METABOLIC PANEL
Anion gap: 17 — ABNORMAL HIGH (ref 5–15)
BUN: 7 mg/dL (ref 6–20)
CO2: 22 mmol/L (ref 22–32)
Calcium: 7.9 mg/dL — ABNORMAL LOW (ref 8.9–10.3)
Chloride: 92 mmol/L — ABNORMAL LOW (ref 98–111)
Creatinine, Ser: 0.72 mg/dL (ref 0.44–1.00)
GFR calc Af Amer: >60 mL/min (ref >60)
GFR calc non Af Amer: >60 mL/min (ref >60)
Glucose, Bld: 98 mg/dL (ref 70–99)
Potassium: 2.6 mmol/L — CL (ref 3.5–5.1)
Sodium: 131 mmol/L — ABNORMAL LOW (ref 135–145)

## 2019-03-18 LAB — VITAMIN B12: Vitamin B-12: 330 pg/mL (ref 180–914)

## 2019-03-18 LAB — APTT: aPTT: 30 seconds (ref 24–36)

## 2019-03-18 LAB — PREGNANCY, URINE: Preg Test, Ur: NEGATIVE

## 2019-03-18 LAB — FERRITIN: Ferritin: 57 ng/mL (ref 11–307)

## 2019-03-18 LAB — PROTIME-INR
INR: 1.3 — ABNORMAL HIGH (ref 0.8–1.2)
Prothrombin Time: 16 seconds — ABNORMAL HIGH (ref 11.4–15.2)

## 2019-03-18 LAB — FOLATE: Folate: 13.8 ng/mL (ref >5.9)

## 2019-03-18 LAB — IRON AND TIBC
Iron: 140 ug/dL (ref 28–170)
Saturation Ratios: 29 % (ref 10.4–31.8)
TIBC: 491 ug/dL — ABNORMAL HIGH (ref 250–450)
UIBC: 351 ug/dL

## 2019-03-18 LAB — WET PREP, GENITAL
Clue Cells Wet Prep HPF POC: NONE SEEN
Sperm: NONE SEEN
Trich, Wet Prep: NONE SEEN
WBC, Wet Prep HPF POC: NONE SEEN
Yeast Wet Prep HPF POC: NONE SEEN

## 2019-03-18 LAB — HEMOGLOBIN AND HEMATOCRIT, BLOOD
HCT: 26.8 % — ABNORMAL LOW (ref 36.0–46.0)
Hemoglobin: 8.2 g/dL — ABNORMAL LOW (ref 12.0–15.0)

## 2019-03-18 LAB — RETICULOCYTES
Immature Retic Fract: 39.8 % — ABNORMAL HIGH (ref 2.3–15.9)
RBC.: 2.31 MIL/uL — ABNORMAL LOW (ref 3.87–5.11)
Retic Count, Absolute: 53.4 10*3/uL (ref 19.0–186.0)
Retic Ct Pct: 2.3 % (ref 0.4–3.1)

## 2019-03-18 LAB — PREPARE RBC (CROSSMATCH)

## 2019-03-18 LAB — ABO/RH: ABO/RH(D): A POS

## 2019-03-18 LAB — SARS CORONAVIRUS 2 BY RT PCR (HOSPITAL ORDER, PERFORMED IN ~~LOC~~ HOSPITAL LAB): SARS Coronavirus 2: NEGATIVE

## 2019-03-18 IMAGING — US US PELVIS COMPLETE
1 series · 13 of 25 positions shown · non-contrast
Comparison: None

CLINICAL DATA: Vaginal bleeding for 8 weeks.

EXAM:
TRANSABDOMINAL AND TRANSVAGINAL ULTRASOUND OF PELVIS
TECHNIQUE: Both transabdominal and transvaginal ultrasound examinations of the
pelvis were performed. Transabdominal technique was performed for
global imaging of the pelvis including uterus, ovaries, adnexal
regions, and pelvic cul-de-sac. It was necessary to proceed with
endovaginal exam following the transabdominal exam to visualize the
uterus, endometrium and ovaries to better advantage.

[Series 1: us pelvis complete · 13 of 42 slices shown]
[im 1/42]
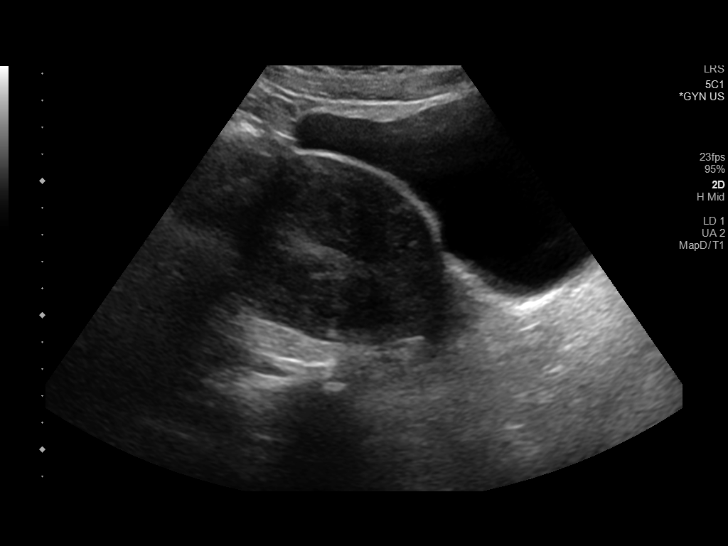
[im 4/42]
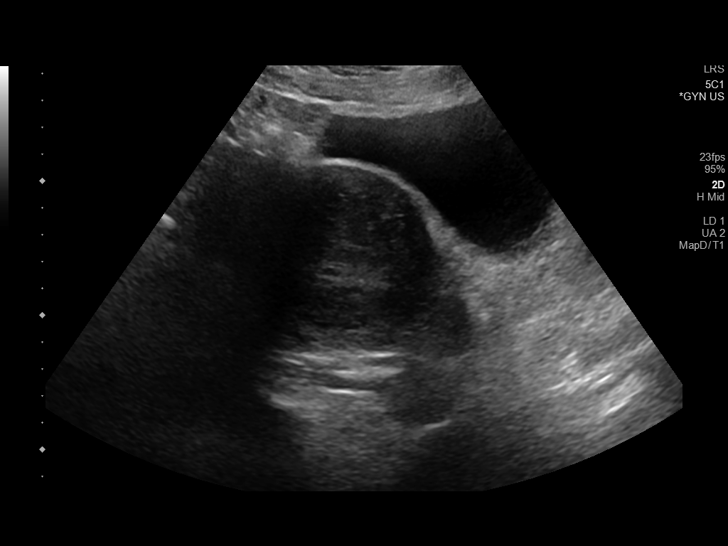
[im 7/42]
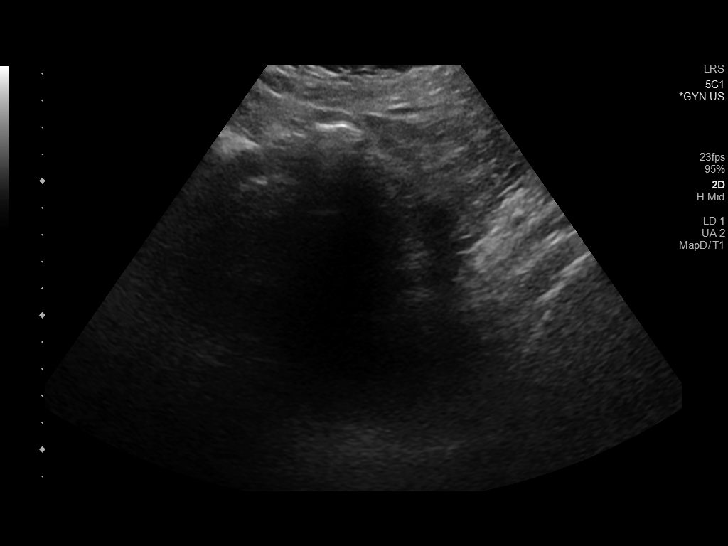
[im 11/42]
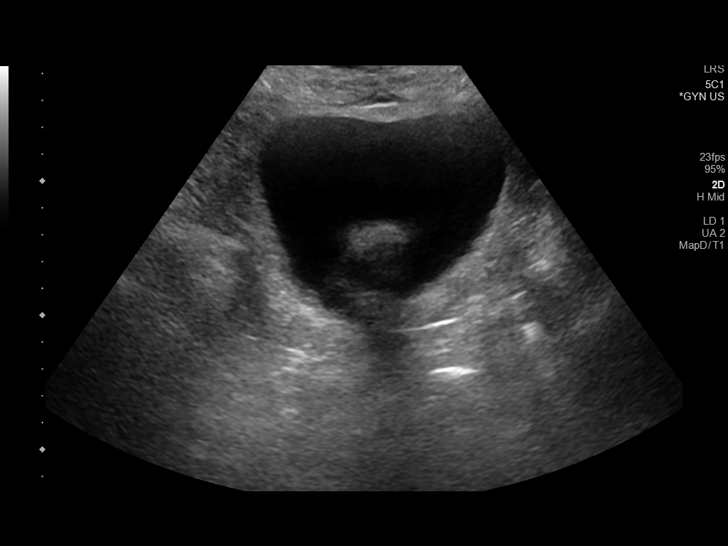
[im 14/42]
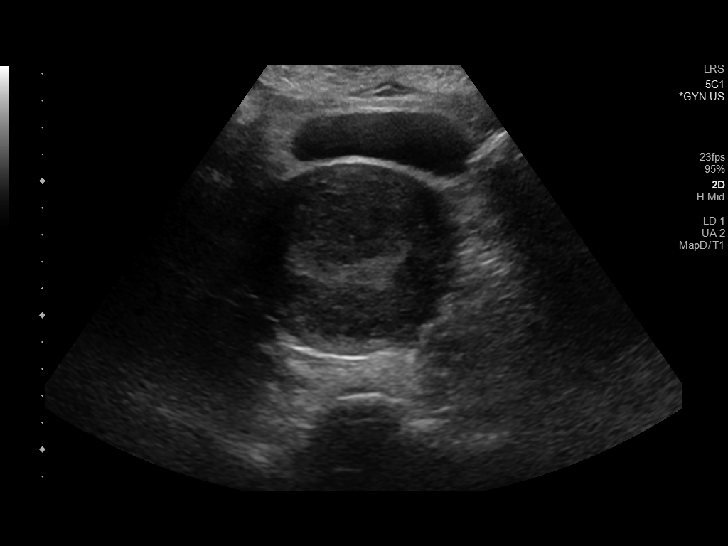
[im 18/42]
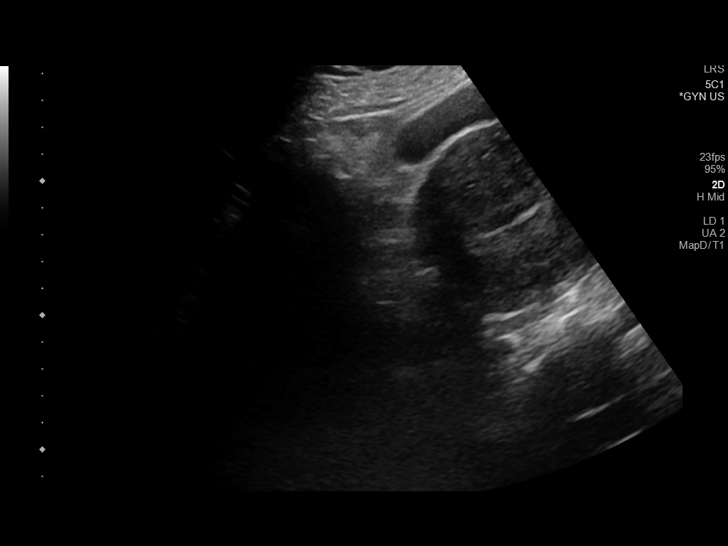
[im 21/42]
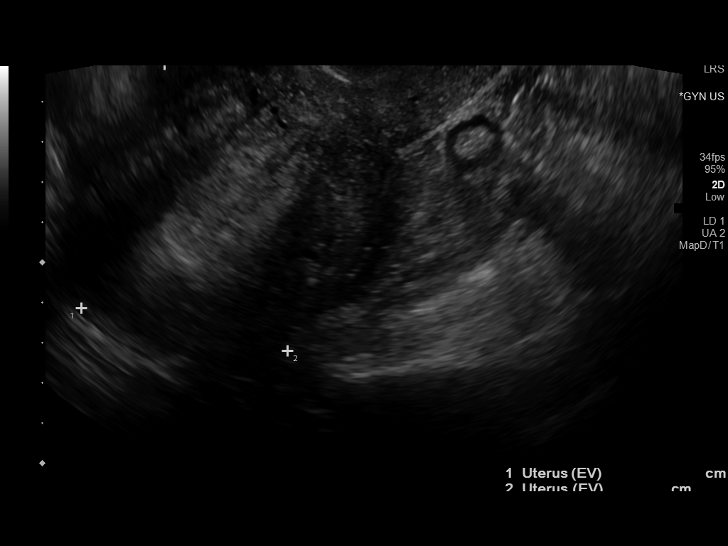
[im 24/42]
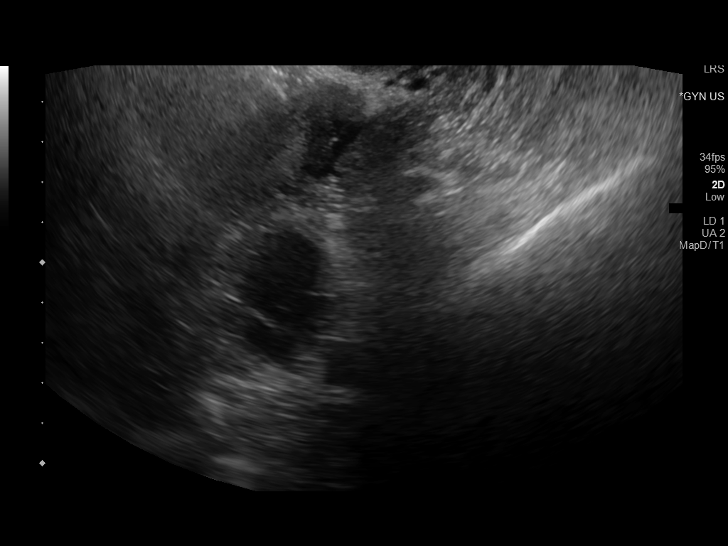
[im 28/42]
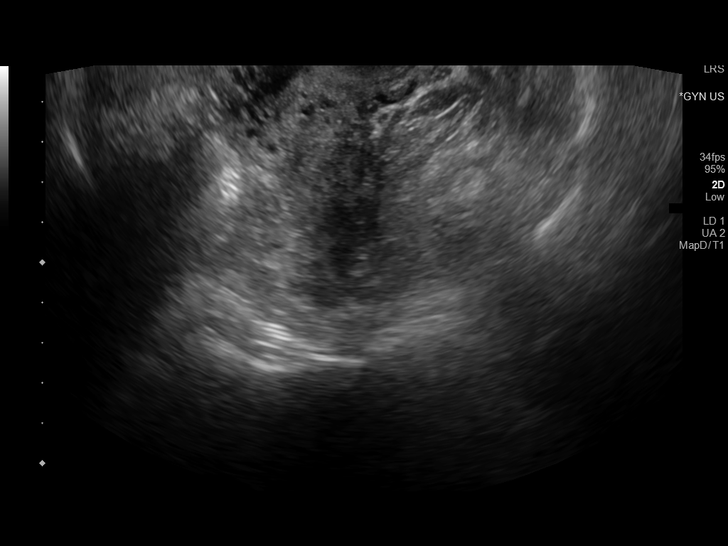
[im 31/42]
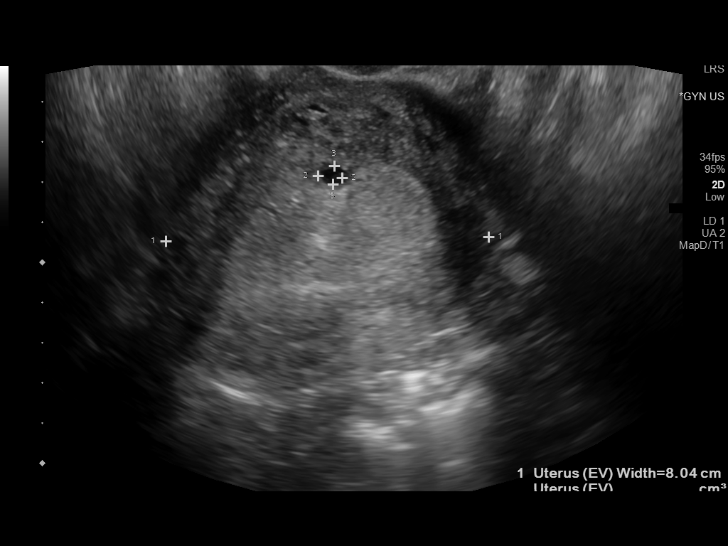
[im 35/42]
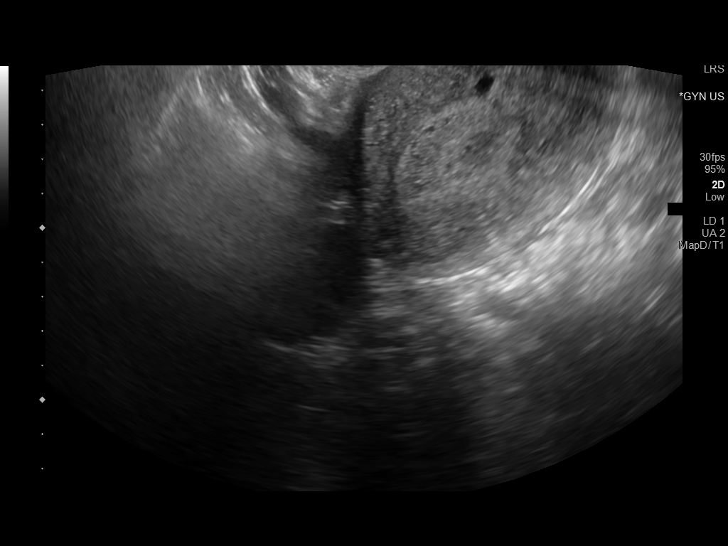
[im 38/42]
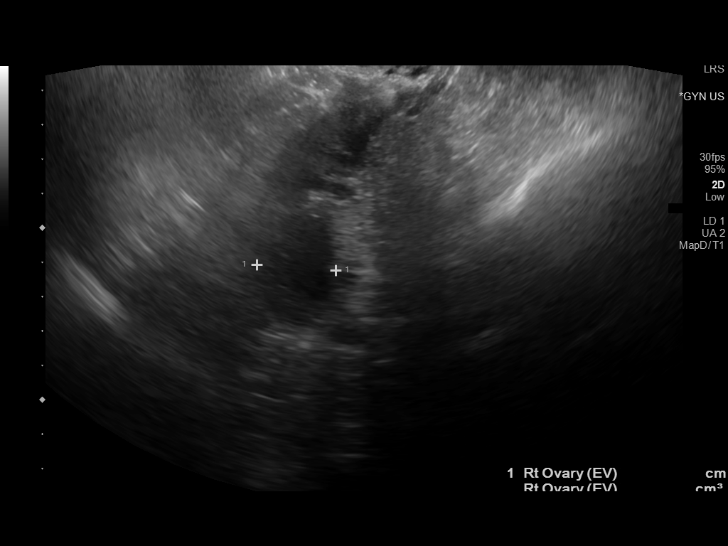
[im 42/42]
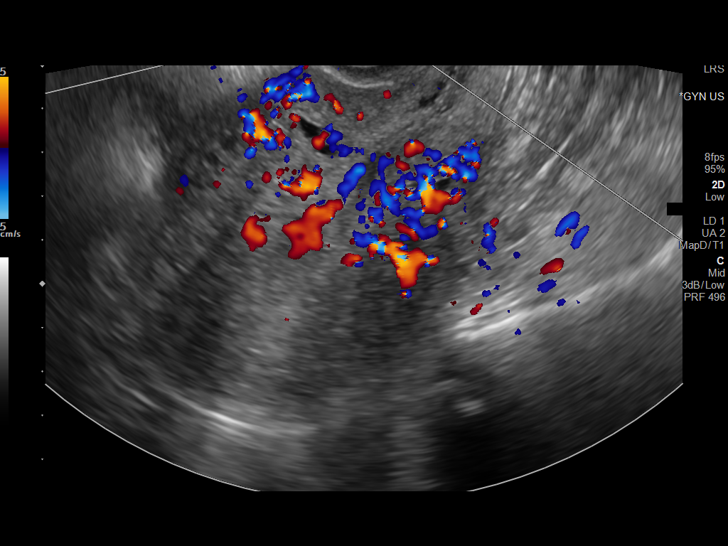

[13 of 25 positions shown; findings below may reference images not displayed]

FINDINGS: Uterus

Measurements: 11.5 x 7.8 x 8.0 cm = volume: 375 mL. No masses.
Hypervascular on color Doppler analysis. Heterogeneous echogenicity.
Several small cystic spaces noted mostly along the junctional zone.

Endometrium

Thickness: 1.9 cm.  No focal abnormality visualized.

Right ovary

Measurements: 3.9 x 2.9 x 2.3 cm = volume: 13.8 mL. Normal
appearance/no adnexal mass.

Left ovary

Not visualized.

Other findings

No abnormal free fluid.
IMPRESSION: 1. Endometrium is thickened to 1.9 cm. No endometrial mass.
Heterogeneous appearance of the uterus with small cystic spaces, but
no defined mass. If bleeding remains unresponsive to hormonal or
medical therapy, focal lesion work-up with sonohysterogram should be
considered. Endometrial biopsy should also be considered in
pre-menopausal patients at high risk for endometrial carcinoma.
(Ref: Radiological Reasoning: Algorithmic Workup of Abnormal Vaginal
Bleeding with Endovaginal Sonography and Sonohysterography. AJR
[3P]; 191:S68-73)
2. No other abnormality.  Left ovary not visualized.

## 2019-03-18 MED ORDER — ONDANSETRON HCL 4 MG PO TABS: 4.0000 mg | ORAL_TABLET | Freq: Four times a day (QID) | ORAL | Status: DC | PRN

## 2019-03-18 MED ORDER — ONDANSETRON HCL 4 MG/2ML IJ SOLN: 4.0000 mg | Freq: Four times a day (QID) | INTRAMUSCULAR | Status: DC | PRN

## 2019-03-18 MED ORDER — VITAMIN B-12 1000 MCG PO TABS
1000.0000 ug | ORAL_TABLET | Freq: Every day | ORAL | Status: DC
  Administered 2019-03-18 – 2019-03-21 (×4): 1000 ug via ORAL
  Filled 2019-03-18 (×4): qty 1

## 2019-03-18 MED ORDER — THIAMINE HCL 100 MG/ML IJ SOLN: 100.0000 mg | Freq: Every day | INTRAMUSCULAR | Status: DC

## 2019-03-18 MED ORDER — ADULT MULTIVITAMIN W/MINERALS CH: 1.0000 | ORAL_TABLET | Freq: Every day | ORAL | Status: DC

## 2019-03-18 MED ORDER — MAGNESIUM SULFATE 2 GM/50ML IV SOLN: 2.0000 g | Freq: Once | INTRAVENOUS | Status: DC

## 2019-03-18 MED ORDER — LORAZEPAM 2 MG/ML IJ SOLN: 1.0000 mg | Freq: Four times a day (QID) | INTRAMUSCULAR | Status: AC | PRN

## 2019-03-18 MED ORDER — SODIUM CHLORIDE 0.9% IV SOLUTION
Freq: Once | INTRAVENOUS | Status: AC
  Administered 2019-03-18: 03:00:00 via INTRAVENOUS

## 2019-03-18 MED ORDER — LORAZEPAM 2 MG/ML IJ SOLN: 0.0000 mg | Freq: Two times a day (BID) | INTRAMUSCULAR | Status: DC

## 2019-03-18 MED ORDER — POTASSIUM CHLORIDE 10 MEQ/100ML IV SOLN
10.0000 meq | Freq: Once | INTRAVENOUS | Status: AC
  Administered 2019-03-18: 10 meq via INTRAVENOUS
  Filled 2019-03-18: qty 100

## 2019-03-18 MED ORDER — ESTROGENS CONJUGATED 25 MG IJ SOLR
25.0000 mg | Freq: Once | INTRAMUSCULAR | Status: DC
  Filled 2019-03-18: qty 25

## 2019-03-18 MED ORDER — SODIUM CHLORIDE 0.9 % IV SOLN: INTRAVENOUS | Status: DC

## 2019-03-18 MED ORDER — ACETAMINOPHEN 325 MG PO TABS: 650.0000 mg | ORAL_TABLET | Freq: Four times a day (QID) | ORAL | Status: DC | PRN

## 2019-03-18 MED ORDER — MEGESTROL ACETATE 40 MG PO TABS
40.0000 mg | ORAL_TABLET | Freq: Two times a day (BID) | ORAL | Status: DC
  Administered 2019-03-18 – 2019-03-19 (×2): 40 mg via ORAL
  Filled 2019-03-18 (×2): qty 1

## 2019-03-18 MED ORDER — FERROUS SULFATE 325 (65 FE) MG PO TABS
325.0000 mg | ORAL_TABLET | Freq: Two times a day (BID) | ORAL | Status: DC
  Administered 2019-03-18 – 2019-03-19 (×3): 325 mg via ORAL
  Filled 2019-03-18 (×3): qty 1

## 2019-03-18 MED ORDER — ADULT MULTIVITAMIN W/MINERALS CH
1.0000 | ORAL_TABLET | Freq: Every day | ORAL | Status: DC
  Administered 2019-03-18 – 2019-03-21 (×4): 1 via ORAL
  Filled 2019-03-18 (×4): qty 1

## 2019-03-18 MED ORDER — LORAZEPAM 1 MG PO TABS: 1.0000 mg | ORAL_TABLET | Freq: Four times a day (QID) | ORAL | Status: AC | PRN

## 2019-03-18 MED ORDER — NICOTINE 21 MG/24HR TD PT24
21.0000 mg | MEDICATED_PATCH | Freq: Every day | TRANSDERMAL | Status: DC
  Filled 2019-03-18 (×3): qty 1

## 2019-03-18 MED ORDER — POTASSIUM CHLORIDE CRYS ER 20 MEQ PO TBCR: 40.0000 meq | EXTENDED_RELEASE_TABLET | Freq: Once | ORAL | Status: DC

## 2019-03-18 MED ORDER — AMLODIPINE BESYLATE 10 MG PO TABS: 10.0000 mg | ORAL_TABLET | Freq: Every day | ORAL | Status: DC

## 2019-03-18 MED ORDER — SODIUM CHLORIDE 0.9 % IV BOLUS
1000.0000 mL | Freq: Once | INTRAVENOUS | Status: AC
  Administered 2019-03-18: 1000 mL via INTRAVENOUS

## 2019-03-18 MED ORDER — POTASSIUM CHLORIDE CRYS ER 20 MEQ PO TBCR: 40.0000 meq | EXTENDED_RELEASE_TABLET | Freq: Once | ORAL | Status: AC

## 2019-03-18 MED ORDER — ACETAMINOPHEN 650 MG RE SUPP: 650.0000 mg | Freq: Four times a day (QID) | RECTAL | Status: DC | PRN

## 2019-03-18 MED ORDER — TRAMADOL HCL 50 MG PO TABS
50.0000 mg | ORAL_TABLET | Freq: Four times a day (QID) | ORAL | Status: DC | PRN
  Administered 2019-03-18 – 2019-03-21 (×7): 50 mg via ORAL
  Filled 2019-03-18 (×7): qty 1

## 2019-03-18 MED ORDER — FOLIC ACID 1 MG PO TABS
1.0000 mg | ORAL_TABLET | Freq: Every day | ORAL | Status: DC
  Administered 2019-03-18 – 2019-03-21 (×4): 1 mg via ORAL
  Filled 2019-03-18 (×4): qty 1

## 2019-03-18 MED ORDER — POTASSIUM CHLORIDE CRYS ER 20 MEQ PO TBCR
40.0000 meq | EXTENDED_RELEASE_TABLET | Freq: Once | ORAL | Status: AC
  Administered 2019-03-18: 40 meq via ORAL
  Filled 2019-03-18: qty 2

## 2019-03-18 MED ORDER — MEGESTROL ACETATE 40 MG PO TABS
40.0000 mg | ORAL_TABLET | Freq: Every day | ORAL | Status: DC
  Administered 2019-03-18: 40 mg via ORAL
  Filled 2019-03-18 (×2): qty 1

## 2019-03-18 MED ORDER — LORAZEPAM 2 MG/ML IJ SOLN: 0.0000 mg | Freq: Four times a day (QID) | INTRAMUSCULAR | Status: AC

## 2019-03-18 MED ORDER — VITAMIN B-1 100 MG PO TABS
100.0000 mg | ORAL_TABLET | Freq: Every day | ORAL | Status: DC
  Administered 2019-03-18 – 2019-03-21 (×4): 100 mg via ORAL
  Filled 2019-03-18 (×4): qty 1

## 2019-03-18 NOTE — ED Notes (Signed)
Date and time results received: 03/18/19 01115 (use smartphrase ".now" to insert current time)  Test: k+ Critical Value: 2.6  Name of Provider Notified: Verline Lema, [place]  Orders Received? Or Actions Taken?: Orders Received - See Orders for details

## 2019-03-18 NOTE — ED Provider Notes (Addendum)
[facility] DEPT Provider Note   CSN: [id number] Arrival date & time: 03/17/19  2331    History   Chief Complaint Chief Complaint  Patient presents with  . Vaginal Bleeding    HPI Judith Drake is a 48 y.o. female past medical history of anemia, hypertension, alcohol abuse, fibroids, GERD, presents emergency department today with chief complaint of vaginal bleeding.  Patient states she has had heavy vaginal bleeding for 3 months.  She states she has been passing softball sign blood clots.  Admits to having to change her pad once every 5 hours.  Also reports generalized weakness and craving ice.  She denies any chest pain, shortness of breath, cough, abdominal pain, nausea, vomiting, urinary symptoms, diarrhea, vaginal discharge, pelvic pain.  Patient reports she has been seen by her pcp for similar vaginal bleeding.  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 She does not remember exactly when this was. History provided by patient with additional history obtained from chart review.      Past Medical History:  Diagnosis Date  . Anemia   . ETOH abuse   . Fibroid   . GERD (gastroesophageal reflux disease)   . Heavy menstrual period   . Hypertension     Patient Active Problem List   Diagnosis Date Noted  . Laceration of scalp 04/20/2018  . Acute respiratory failure with hypoxia ([place]) 04/10/2018  . Nonallopathic lesion of sacral region 03/28/2018  . Nonallopathic lesion of thoracic region 03/28/2018  . Nonallopathic lesion of lumbosacral region 03/28/2018  . Nonallopathic lesion of pelvic region 03/28/2018  . Nonallopathic lesion of cervical region 03/28/2018  . Enlarged thyroid 03/07/2018  . 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 Lumbar radiculopathy, right 02/08/2018  . GAD (generalized anxiety disorder) 01/03/2018  . Rash 01/03/2018  .  Right hip pain 01/03/2018  . GERD (gastroesophageal reflux disease) 12/13/2017  . Heavy menstrual period 12/13/2017  . Hypokalemia 12/13/2017  . Fibroid uterus 12/13/2017  . Elevated liver enzymes 11/24/2013  . Depression 11/24/2013  . Fatigue 07/12/2013  . Abnormal laboratory test 07/12/2013  . Symptomatic anemia 07/12/2013  . HTN (hypertension) 07/12/2013  . Alcohol abuse 07/12/2013  . Tobacco abuse 07/12/2013  . Yeast infection of the skin 07/12/2013  . Routine general medical examination at a health care facility 07/12/2013    Past Surgical History:  Procedure Laterality Date  . ELBOW SURGERY     1997   . TUBAL LIGATION       OB History   No obstetric history on file.      Home Medications    Prior to Admission medications   Medication Sig Start Date End Date Taking? Authorizing Provider  amLODipine (NORVASC) 10 MG tablet Take 1 tablet (10 mg total) by mouth daily. 04/20/18  Yes Arnett, Yvetta Coder, FNP  ferrous sulfate 325 (65 FE) MG tablet TAKE 1 TABLET (325 MG TOTAL) BY MOUTH 2 (TWO) TIMES DAILY WITH A MEAL. 10/24/18  Yes Arnett, Yvetta Coder, FNP  Multiple Vitamin (MULTIVITAMIN WITH MINERALS) TABS tablet Take 1 tablet by mouth daily. 04/13/18  Yes Gouru, Illene Silver, MD  acetaminophen (TYLENOL) 325 MG tablet Take 2 tablets (650 mg total) by mouth every 6 (six) hours as needed for mild pain (or Fever >/= 101). Patient not taking: Reported on 06/09/2018 04/13/18   Nicholes Mango, MD  busPIRone (BUSPAR) 10 MG tablet  TAKE 1 TABLET BY MOUTH THREE TIMES A DAY Patient not taking: No sig reported 04/28/18   Burnard Hawthorne, FNP  cyanocobalamin ([facility] VITAMIN B12) 1000 MCG tablet Take 1 tablet (1,000 mcg total) by mouth daily. Patient not taking: Reported on 03/18/2019 07/29/18   Burnard Hawthorne, FNP  folic acid (FOLVITE) 1 MG tablet Take 1 tablet (1 mg total) by mouth daily. Patient not taking: Reported on 04/20/2018 04/13/18   Nicholes Mango, MD  gabapentin (NEURONTIN) 100 MG capsule Take 2  capsules (200 mg total) by mouth at bedtime. Patient not taking: Reported on 06/09/2018 02/08/18   Lyndal Pulley, DO  thiamine 100 MG tablet Take 1 tablet (100 mg total) by mouth daily. Patient not taking: Reported on 06/09/2018 04/13/18   Nicholes Mango, MD    Family History Family History  Problem Relation Age of Onset  . Cancer Mother 42       Lung   . Hyperlipidemia Mother   . Hypertension Mother   . Stroke Mother   . Kidney disease Mother   . Diabetes Mother   . Heart disease Mother   . Hyperlipidemia Father   . Autism Son   . Cancer Paternal Grandfather 63       Colon Cancer - died  . Breast cancer Neg Hx     Social History Social History   Tobacco Use  . Smoking status: Light Tobacco Smoker    Types: Cigarettes  . Smokeless tobacco: Current User  Substance Use Topics  . Alcohol use: Yes  . Drug use: No     Allergies   Amoxicillin   Review of Systems Review of Systems  Constitutional: Negative for chills and fever.  HENT: Negative for congestion, ear discharge, ear pain, sinus pressure, sinus pain and sore throat.   Eyes: Negative for pain and redness.  Respiratory: Negative for cough and shortness of breath.   Cardiovascular: Negative for chest pain.  Gastrointestinal: Negative for abdominal pain, constipation, diarrhea, nausea and vomiting.  Genitourinary: Positive for menstrual problem and vaginal bleeding. Negative for dysuria and hematuria.  Musculoskeletal: Negative for back pain and neck pain.  Skin: Negative for wound.  Neurological: Positive for weakness. Negative for numbness and headaches.     Physical Exam Updated Vital Signs BP 135/79   Pulse (!) 116   Temp 98.4 F (36.9 C)   Resp (!) 23   Ht 5\' 3"  (1.6 m)   Wt 65.8 kg   SpO2 100%   BMI 25.69 kg/m   Physical Exam Vitals signs and nursing note reviewed.  Constitutional:      General: She is not in acute distress.    Appearance: She is not ill-appearing.  HENT:     Head:  Normocephalic and atraumatic.     Right Ear: Tympanic membrane and external ear normal.     Left Ear: Tympanic membrane and external ear normal.     Nose: Nose normal.     Mouth/Throat:     Mouth: Mucous membranes are dry.     Pharynx: Oropharynx is clear.  Eyes:     General: No scleral icterus.       Right eye: No discharge.        Left eye: No discharge.     Extraocular Movements: Extraocular movements intact.     Conjunctiva/sclera: Conjunctivae normal.     Pupils: Pupils are equal, round, and reactive to light.  Neck:     Musculoskeletal: Normal range of motion.  Vascular: No JVD.  Cardiovascular:     Rate and Rhythm: Regular rhythm. Tachycardia present.     Pulses: Normal pulses.          Radial pulses are 2+ on the right side and 2+ on the left side.     Heart sounds: Normal heart sounds.  Pulmonary:     Comments: Lungs clear to auscultation in all fields. Symmetric chest rise. No wheezing, rales, or rhonchi. Abdominal:     Comments: Abdomen is soft, non-distended, and non-tender in all quadrants. No rigidity, no guarding. No peritoneal signs.  Genitourinary:    Comments: Normal external genitalia. No pain with speculum insertion. Closed cervical os with normal appearance - no rash or lesions. No significant discharge noted from cervix or in vaginal vault.  Leading seen in vaginal vault, no clots noted. On bimanual examination no adnexal tenderness or cervical motion tenderness. Chaperone NT present during exam.  Musculoskeletal: Normal range of motion.  Skin:    General: Skin is warm and dry.     Capillary Refill: Capillary refill takes less than 2 seconds.  Neurological:     Mental Status: She is oriented to person, place, and time.     GCS: GCS eye subscore is 4. GCS verbal subscore is 5. GCS motor subscore is 6.     Comments: Fluent speech, no facial droop.  Psychiatric:        Behavior: Behavior normal.      ED Treatments / Results  Labs (all labs ordered  are listed, but only abnormal results are displayed) Labs Reviewed  BASIC METABOLIC PANEL - Abnormal; Notable for the following components:      Result Value   Sodium 131 (*)    Potassium 2.6 (*)    Chloride 92 (*)    Calcium 7.9 (*)    Anion gap 17 (*)    All other components within normal limits  CBC WITH DIFFERENTIAL/PLATELET - Abnormal; Notable for the following components:   RBC 2.30 (*)    Hemoglobin 6.9 (*)    HCT 22.2 (*)    All other components within normal limits  URINALYSIS, ROUTINE W REFLEX MICROSCOPIC - Abnormal; Notable for the following components:   Color, Urine STRAW (*)    Specific Gravity, Urine 1.004 (*)    All other components within normal limits  WET PREP, GENITAL  SARS CORONAVIRUS 2 (HOSPITAL ORDER, [facility])  PREGNANCY, URINE  VITAMIN B12  FOLATE  IRON AND TIBC  FERRITIN  RETICULOCYTES  TYPE AND SCREEN  PREPARE RBC (CROSSMATCH)  ABO/RH  GC/CHLAMYDIA PROBE AMP ([place]) NOT AT [facility]    EKG None  Radiology No results found.  Procedures .Critical Care Performed by: Cherre Robins, PA-C Authorized by: Cherre Robins, PA-C   Critical care provider statement:    Critical care time (minutes):  33   Critical care time was exclusive of:  Separately billable procedures and treating other patients and teaching time   Critical care was time spent personally by me on the following activities:  Development of treatment plan with patient or surrogate, examination of patient, obtaining history from patient or surrogate, ordering and review of laboratory studies, ordering and performing treatments and interventions, re-evaluation of patient's condition, pulse oximetry and review of old charts   I assumed direction of critical care for this patient from another provider in my specialty: no     (including critical care time)  Medications Ordered in ED Medications  0.9 %  sodium chloride infusion (Manually program  via Guardrails IV Fluids) (has no administration in time range)  potassium chloride 10 mEq in 100 mL IVPB (has no administration in time range)  potassium chloride SA (K-DUR) CR tablet 40 mEq (has no administration in time range)  sodium chloride 0.9 % bolus 1,000 mL (1,000 mLs Intravenous New Bag/Given 03/18/19 0118)     Initial Impression / Assessment and Plan / ED Course  I have reviewed the triage vital signs and the nursing notes.  Pertinent labs & imaging results that were available during my care of the patient were reviewed by me and considered in my medical decision making (see chart for details).  On arrival patient is tachycardic and tachypneic.  She is normotensive.  Lungs are clear to auscultation all fields.  Abdomen is nontender, no peritoneal signs.  Work-up initiated with CBC, BMP, UA.  IVF started.  Pelvic exam with blood in vaginal vault, no cervical motion tenderness.  Patient is not concerned with STIs, given she has no vaginal discharge and wet prep without any findings, will not prophylactically treat at this time. Pt aware she has cultures pending. Labs are significant for Hypokalemia of 2.6, will replete with IV and oral.  CBC shows hemoglobin of 6.9.  Type and screen pending, will plan to transfuse and admit.  Patient care transferred to S. [place] PA-C at the end of my shift. Patient presentation, ED course, and plan of care discussed with review of all pertinent labs and imaging. Please see her note for further details regarding further ED course and disposition.    Final Clinical Impressions(s) / ED Diagnoses   Final diagnoses:  None    ED Discharge Orders    None       Cherre Robins, PA-C 03/18/19 0351    Zela Sobieski, Harley Hallmark, PA-C 03/18/19 0357    Varney Biles, MD 03/19/19 [phone]

## 2019-03-18 NOTE — ED Notes (Signed)
ED TO INPATIENT HANDOFF REPORT  Name/Age/Gender Judith Drake 48 y.o. female  Code Status Code Status History    Date Active Date Inactive Code Status Order ID Comments User Context   04/10/2018 0827 04/13/2018 1424 Full Code [id number]  Demetrios Loll, MD Inpatient   12/13/2017 0233 12/13/2017 2302 Full Code [id number]  Ivor Costa, MD ED   Advance Care Planning Activity      Home/SNF/Other Home  Chief Complaint vaginal bleeding  Level of Care/Admitting Diagnosis ED Disposition    ED Disposition Condition [facility] Area: [facility] [[id number]]  Level of Care: Telemetry [5]  Admit to tele based on following criteria: Other see comments  Comments: Tachycardia  Covid Evaluation: Asymptomatic Screening Protocol (No Symptoms)  Diagnosis: Symptomatic anemia [9169450]  Admitting Physician: Ivor Costa [[id number]]  Attending Physician: Ivor Costa [[id number]]  PT Class (Do Not Modify): Observation [104]  PT Acc Code (Do Not Modify): Observation [10022]       Medical History Past Medical History:  Diagnosis Date  . Anemia   . ETOH abuse   . Fibroid   . GERD (gastroesophageal reflux disease)   . Heavy menstrual period   . Hypertension     Allergies Allergies  Allergen Reactions  . Amoxicillin Nausea And Vomiting    Has patient had a PCN reaction causing immediate rash, facial/tongue/throat swelling, SOB or lightheadedness with hypotension: No Has patient had a PCN reaction causing severe rash involving mucus membranes or skin necrosis: No Has patient had a PCN reaction that required hospitalization: No Has patient had a PCN reaction occurring within the last 10 years: No If all of the above answers are "NO", then may proceed with Cephalosporin use.     IV Location/Drains/Wounds Patient Lines/Drains/Airways Status   Active Line/Drains/Airways    Name:   Placement date:   Placement time:   Site:   Days:   Peripheral IV 03/18/19 Right Antecubital    03/18/19    0007    Antecubital   less than 1   Peripheral IV 03/18/19 Left Antecubital   03/18/19    0342    Antecubital   less than 1   Wound / Incision (Open or Dehisced) 04/10/18 Laceration Head Left;Lateral;Upper laceration closed with staples   04/10/18    0830    Head   342          Labs/Imaging Results for orders placed or performed during the hospital encounter of 03/17/19 (from the past 48 hour(s))  Basic metabolic panel     Status: Abnormal   Collection Time: 03/18/19 12:09 AM  Result Value Ref Range   Sodium 131 (L) 135 - 145 mmol/L   Potassium 2.6 (LL) 3.5 - 5.1 mmol/L    Comment: CRITICAL RESULT CALLED TO, READ BACK BY AND VERIFIED WITH: J,TALKINGTON AT 0112 ON 03/18/19 BY A,MOHAMED    Chloride 92 (L) 98 - 111 mmol/L   CO2 22 22 - 32 mmol/L   Glucose, Bld 98 70 - 99 mg/dL   BUN 7 6 - 20 mg/dL   Creatinine, Ser 0.72 0.44 - 1.00 mg/dL   Calcium 7.9 (L) 8.9 - 10.3 mg/dL   GFR calc non Af Amer >60 >60 mL/min   GFR calc Af Amer >60 >60 mL/min   Anion gap 17 (H) 5 - 15    Comment: Performed at [facility], [place] [address], [place], [place] [address]  CBC with Differential     Status:  Abnormal   Collection Time: 03/18/19 12:09 AM  Result Value Ref Range   WBC 10.5 4.0 - 10.5 K/uL   RBC 2.30 (L) 3.87 - 5.11 MIL/uL   Hemoglobin 6.9 (LL) 12.0 - 15.0 g/dL    Comment: REPEATED TO VERIFY THIS CRITICAL RESULT HAS VERIFIED AND BEEN CALLED TO TEETERS,Z BY POTEAT,SHANNON ON 07 11 2020 AT 0136, AND HAS BEEN READ BACK. CRITICAL RESULT VERIFIED    HCT 22.2 (L) 36.0 - 46.0 %   MCV 96.5 80.0 - 100.0 fL   MCH 30.0 26.0 - 34.0 pg   MCHC 31.1 30.0 - 36.0 g/dL   RDW 14.2 11.5 - 15.5 %   Platelets 308 150 - 400 K/uL   nRBC 0.0 0.0 - 0.2 %   Neutrophils Relative % 60 %   Neutro Abs 6.4 1.7 - 7.7 K/uL   Lymphocytes Relative 29 %   Lymphs Abs 3.1 0.7 - 4.0 K/uL   Monocytes Relative 6 %   Monocytes Absolute 0.6 0.1 - 1.0 K/uL   Eosinophils Relative 3 %    Eosinophils Absolute 0.3 0.0 - 0.5 K/uL   Basophils Relative 1 %   Basophils Absolute 0.1 0.0 - 0.1 K/uL   Immature Granulocytes 1 %   Abs Immature Granulocytes 0.05 0.00 - 0.07 K/uL    Comment: Performed at [facility], [place] [address], [place], [place] [address]  Reticulocytes     Status: Abnormal   Collection Time: 03/18/19 12:09 AM  Result Value Ref Range   Retic Ct Pct 2.3 0.4 - 3.1 %   RBC. 2.31 (L) 3.87 - 5.11 MIL/uL   Retic Count, Absolute 53.4 19.0 - 186.0 K/uL   Immature Retic Fract 39.8 (H) 2.3 - 15.9 %    Comment: Performed at [facility], [place] [address] [place], [place] [address]  Wet prep, genital     Status: None   Collection Time: 03/18/19 12:10 AM   Specimen: Cervical/Vaginal swab  Result Value Ref Range   Yeast Wet Prep HPF POC NONE SEEN NONE SEEN   Trich, Wet Prep NONE SEEN NONE SEEN   Clue Cells Wet Prep HPF POC NONE SEEN NONE SEEN   WBC, Wet Prep HPF POC NONE SEEN NONE SEEN   Sperm NONE SEEN     Comment: Performed at [facility], [place] [address] [place], [place] [address]  Urinalysis, Routine w reflex microscopic     Status: Abnormal   Collection Time: 03/18/19  1:03 AM  Result Value Ref Range   Color, Urine STRAW (A) YELLOW   APPearance CLEAR CLEAR   Specific Gravity, Urine 1.004 (L) 1.005 - 1.030   pH 6.0 5.0 - 8.0   Glucose, UA NEGATIVE NEGATIVE mg/dL   Hgb urine dipstick NEGATIVE NEGATIVE   Bilirubin Urine NEGATIVE NEGATIVE   Ketones, ur NEGATIVE NEGATIVE mg/dL   Protein, ur NEGATIVE NEGATIVE mg/dL   Nitrite NEGATIVE NEGATIVE   Leukocytes,Ua NEGATIVE NEGATIVE    Comment: Performed at [place] [address] [place], [place] [address]  Pregnancy, urine     Status: None   Collection Time: 03/18/19  1:03 AM  Result Value Ref Range   Preg Test, Ur NEGATIVE NEGATIVE    Comment:        THE SENSITIVITY OF THIS METHODOLOGY IS >20 mIU/mL. Performed at [facility], [place] [address], [place], [place] [address]   Type and screen [facility]  Status: None (Preliminary result)   Collection Time: 03/18/19  1:03 AM  Result Value Ref Range   ABO/RH(D) A POS    Antibody Screen NEG    Sample Expiration      03/21/2019,2359 Performed at [facility], [place] [address] [place], [place] [address]    Unit Number [id number]    Blood Component Type RED CELLS,LR    Unit division 00    Status of Unit ALLOCATED    Transfusion Status OK TO TRANSFUSE    Crossmatch Result Compatible    Unit Number [id number]    Blood Component Type RED CELLS,LR    Unit division 00    Status of Unit ALLOCATED    Transfusion Status OK TO TRANSFUSE    Crossmatch Result Compatible   ABO/Rh     Status: None   Collection Time: 03/18/19  1:03 AM  Result Value Ref Range   ABO/RH(D)      A POS Performed at [facility], [place] [address], [place], [place] [address]   Prepare RBC     Status: None   Collection Time: 03/18/19  3:23 AM  Result Value Ref Range   Order Confirmation      ORDER PROCESSED BY BLOOD BANK Performed at [facility], [place] [address] [place], [place] [address]   Vitamin B12     Status: None   Collection Time: 03/18/19  3:23 AM  Result Value Ref Range   Vitamin B-12 330 180 - 914 pg/mL    Comment: (NOTE) This assay is not validated for testing neonatal or myeloproliferative syndrome specimens for Vitamin B12 levels. Performed at [facility], [place] [address] [place], [place] [address]   Folate     Status: None   Collection Time: 03/18/19  3:23 AM  Result Value Ref Range   Folate 13.8 >5.9 ng/mL    Comment: Performed at [facility], [place] [address], [place], [place] [address]  Iron and TIBC     Status: Abnormal   Collection Time: 03/18/19  3:23 AM  Result Value Ref Range   Iron 140 28 - 170 ug/dL   TIBC 491 (H) 250 -  450 ug/dL   Saturation Ratios 29 10.4 - 31.8 %   UIBC 351 ug/dL    Comment: Performed at [facility], [place] [address] [place], [place] [address]  Ferritin     Status: None   Collection Time: 03/18/19  3:23 AM  Result Value Ref Range   Ferritin 57 11 - 307 ng/mL    Comment: Performed at [facility], [place] [address], [place], [place] [address]  SARS Coronavirus 2 (CEPHEID - Performed in [place] hospital lab), Hosp Order     Status: None   Collection Time: 03/18/19  3:27 AM   Specimen: Nasopharyngeal Swab  Result Value Ref Range   SARS Coronavirus 2 NEGATIVE NEGATIVE    Comment: (NOTE) If result is NEGATIVE SARS-CoV-2 target nucleic acids are NOT DETECTED. The SARS-CoV-2 RNA is generally detectable in upper and lower  respiratory specimens during the acute phase of infection. The lowest  concentration of SARS-CoV-2 viral copies this assay can detect is 250  copies / mL. A negative result does not preclude SARS-CoV-2 infection  and should not be used as the sole basis for treatment or other  patient management decisions.  A negative result may occur with  improper specimen collection / handling, submission of specimen other  than nasopharyngeal swab, presence of viral mutation(s) within the  areas targeted by this assay, and inadequate number of viral copies  (<250 copies / mL). A negative result must be combined with clinical  observations, patient history, and epidemiological information. If result is POSITIVE SARS-CoV-2 target nucleic acids are DETECTED. The SARS-CoV-2 RNA is generally detectable in upper and lower  respiratory specimens dur ing the acute phase of infection.  Positive  results are indicative of active infection with SARS-CoV-2.  Clinical  correlation with patient history and other diagnostic information is  necessary to determine patient infection status.  Positive results do  not rule out bacterial infection or  co-infection with other viruses. If result is PRESUMPTIVE POSTIVE SARS-CoV-2 nucleic acids MAY BE PRESENT.   A presumptive positive result was obtained on the submitted specimen  and confirmed on repeat testing.  While 2019 novel coronavirus  (SARS-CoV-2) nucleic acids may be present in the submitted sample  additional confirmatory testing may be necessary for epidemiological  and / or clinical management purposes  to differentiate between  SARS-CoV-2 and other Sarbecovirus currently known to infect humans.  If clinically indicated additional testing with an alternate test  methodology [phone]) is advised. The SARS-CoV-2 RNA is generally  detectable in upper and lower respiratory sp ecimens during the acute  phase of infection. The expected result is Negative. Fact Sheet for Patients:  StrictlyIdeas.no Fact Sheet for Healthcare Providers: BankingDealers.co.za This test is not yet approved or cleared by the [place] FDA and has been authorized for detection and/or diagnosis of SARS-CoV-2 by FDA under an Emergency Use Authorization (EUA).  This EUA will remain in effect (meaning this test can be used) for the duration of the COVID-19 declaration under Section 564(b)(1) of the Act, 21 U.S.C. section 360bbb-3(b)(1), unless the authorization is terminated or revoked sooner. Performed at [facility], [place] [address], [place], [place] [address]    US Transvaginal Non-ob  Result Date: 03/18/2019 CLINICAL DATA:  Vaginal bleeding for 8 weeks. EXAM: TRANSABDOMINAL AND TRANSVAGINAL ULTRASOUND OF PELVIS TECHNIQUE: Both transabdominal and transvaginal ultrasound examinations of the pelvis were performed. Transabdominal technique was performed for global imaging of the pelvis including uterus, ovaries, adnexal regions, and pelvic cul-de-sac. It was necessary to proceed with endovaginal exam following the transabdominal exam to  visualize the uterus, endometrium and ovaries to better advantage. COMPARISON:  None FINDINGS: Uterus Measurements: 11.5 x 7.8 x 8.0 cm = volume: 375 mL. No masses. Hypervascular on color Doppler analysis. Heterogeneous echogenicity. Several small cystic spaces noted mostly along the junctional zone. Endometrium Thickness: 1.9 cm.  No focal abnormality visualized. Right ovary Measurements: 3.9 x 2.9 x 2.3 cm = volume: 13.8 mL. Normal appearance/no adnexal mass. Left ovary Not visualized. Other findings No abnormal free fluid. IMPRESSION: 1. Endometrium is thickened to 1.9 cm. No endometrial mass. Heterogeneous appearance of the uterus with small cystic spaces, but no defined mass. If bleeding remains unresponsive to hormonal or medical therapy, focal lesion work-up with sonohysterogram should be considered. Endometrial biopsy should also be considered in pre-menopausal patients at high risk for endometrial carcinoma. (Ref: Radiological Reasoning: Algorithmic Workup of Abnormal Vaginal Bleeding with Endovaginal Sonography and Sonohysterography. AJR 2008; 706:C37-62) 2. No other abnormality.  Left ovary not visualized. Electronically Signed   By: Lajean Manes M.D.   On: 03/18/2019 05:56   US Pelvis Complete  Result Date: 03/18/2019 CLINICAL DATA:  Vaginal bleeding for 8 weeks. EXAM: TRANSABDOMINAL AND TRANSVAGINAL ULTRASOUND OF PELVIS TECHNIQUE: Both transabdominal and  transvaginal ultrasound examinations of the pelvis were performed. Transabdominal technique was performed for global imaging of the pelvis including uterus, ovaries, adnexal regions, and pelvic cul-de-sac. It was necessary to proceed with endovaginal exam following the transabdominal exam to visualize the uterus, endometrium and ovaries to better advantage. COMPARISON:  None FINDINGS: Uterus Measurements: 11.5 x 7.8 x 8.0 cm = volume: 375 mL. No masses. Hypervascular on color Doppler analysis. Heterogeneous echogenicity. Several small cystic spaces  noted mostly along the junctional zone. Endometrium Thickness: 1.9 cm.  No focal abnormality visualized. Right ovary Measurements: 3.9 x 2.9 x 2.3 cm = volume: 13.8 mL. Normal appearance/no adnexal mass. Left ovary Not visualized. Other findings No abnormal free fluid. IMPRESSION: 1. Endometrium is thickened to 1.9 cm. No endometrial mass. Heterogeneous appearance of the uterus with small cystic spaces, but no defined mass. If bleeding remains unresponsive to hormonal or medical therapy, focal lesion work-up with sonohysterogram should be considered. Endometrial biopsy should also be considered in pre-menopausal patients at high risk for endometrial carcinoma. (Ref: Radiological Reasoning: Algorithmic Workup of Abnormal Vaginal Bleeding with Endovaginal Sonography and Sonohysterography. AJR 2008; 379:K24-09) 2. No other abnormality.  Left ovary not visualized. Electronically Signed   By: Lajean Manes M.D.   On: 03/18/2019 05:56    Pending Labs Unresulted Labs (From admission, onward)    Start     Ordered   03/18/19 0413  HIV Antibody (routine testing w rflx)  Once,   R     03/18/19 0413   03/18/19 0413  RPR  Once,   R     03/18/19 0413   Signed and Held  Magnesium  Tomorrow morning,   R     Signed and Held   Signed and Held  Protime-INR  Once,   R     Signed and Held   Signed and Held  APTT  Once,   R     Signed and Held   Signed and Occupational hygienist morning,   R     Signed and Held   Signed and Held  CBC  Tomorrow morning,   R     Signed and Held          Vitals/Pain Today's Vitals   03/17/19 2340 03/17/19 2345 03/17/19 2347 03/18/19 0325  BP:  135/79  118/74  Pulse:  (!) 116  (!) 103  Resp:  (!) 23  16  Temp:  98.4 F (36.9 C)    SpO2: 96% 100%  100%  Weight:   65.8 kg   Height:   5\' 3"  (1.6 m)   PainSc:  0-No pain      Isolation Precautions No active isolations  Medications Medications  potassium chloride SA (K-DUR) CR tablet 40 mEq (has no  administration in time range)  magnesium sulfate IVPB 2 g 50 mL (has no administration in time range)  conjugated estrogens (PREMARIN) injection 25 mg (has no administration in time range)  potassium chloride SA (K-DUR) CR tablet 40 mEq (has no administration in time range)  sodium chloride 0.9 % bolus 1,000 mL (0 mLs Intravenous Stopped 03/18/19 0319)  0.9 %  sodium chloride infusion (Manually program via Guardrails IV Fluids) ( Intravenous New Bag/Given 03/18/19 0325)  potassium chloride 10 mEq in 100 mL IVPB (0 mEq Intravenous Stopped 03/18/19 0438)  potassium chloride SA (K-DUR) CR tablet 40 mEq (40 mEq Oral Given 03/18/19 0327)    Mobility walks

## 2019-03-18 NOTE — ED Provider Notes (Addendum)
  Physical Exam  BP 118/74   Pulse (!) 103   Temp 98.4 F (36.9 C)   Resp 16   Ht 5\' 3"  (1.6 m)   Wt 65.8 kg   SpO2 100%   BMI 25.69 kg/m   Physical Exam  Gen: appears pale CV: mildly tachy at 105   ED Course/Procedures     .Critical Care Performed by: Franchot Heidelberg, PA-C Authorized by: Franchot Heidelberg, PA-C   Critical care provider statement:    Critical care time (minutes):  15   Critical care time was exclusive of:  Separately billable procedures and treating other patients and teaching time   Critical care was necessary to treat or prevent imminent or life-threatening deterioration of the following conditions:  Circulatory failure   Critical care was time spent personally by me on the following activities:  Blood draw for specimens, development of treatment plan with patient or surrogate, evaluation of patient's response to treatment, examination of patient, obtaining history from patient or surrogate, ordering and performing treatments and interventions, ordering and review of laboratory studies, ordering and review of radiographic studies, pulse oximetry, re-evaluation of patient's condition and review of old charts   I assumed direction of critical care for this patient from another provider in my specialty: yes   Comments:     Pt with critically low hgb and low K. Discussed finding and plan with pt and called for admission. Ordered US and repeat potassium.     MDM   Patient signed out to me by K Albrizze, PA-C.  Please see previous notes for further history.  In brief, patient presenting for vaginal bleeding x4 months.  Recently she has had increased generalized weakness and been chewing on ice.  She has a history of needing a blood transfusion due to her vaginal bleeding, has been recommended to have an ablation many years ago, but never did so.  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    Labs show critical hemoglobin of  6.9 and a potassium of 2.6.  Blood ordered and potassium given.  Will call for admission for symptomatic anemia.  Discussed with Dr. Blaine Hamper from [facility], pt to be admitted.      Franchot Heidelberg, PA-C 03/18/19 [place], Aldea Avis, PA-C 03/18/19 [place], [place], MD 03/19/19 [phone]

## 2019-03-18 NOTE — Care Management (Signed)
This is a no charge note  Pending admission per PA, Judith Drake  48 year old lady with past medical history of hypertension, GERD, depression, heavy menstruation due to fibroid, Alcohol abuse, tobacco abuse, iron deficiency anemia, who presents with vaginal bleeding, which is been going on for nearly 4 months.  Developed generalized weakness.  Found to have hemoglobin dropped from 15.2 on 06/09/18 to 6.9.  Denies rectal bleeding or dark stool.  Patient was found to have potassium 2.6, which was treated.  Also give 1 g of magnesium sulfate.  Patient will be transfused 2 unit of blood. Will give 25 milligrams of Premarin by IV, and then oral Megace 40 mg daily.  I put in admission order, but no time to complete HPI.  This patient will be carryover.   Ivor Costa, MD  [facility]   If 7PM-7AM, please contact night-coverage [email or web address] Password [facility] 03/18/2019, 5:45 AM

## 2019-03-18 NOTE — H&P (Addendum)
History and Physical  Judith Drake FGH:[id number] DOB: 11/20/1970 DOA: 03/17/2019   Patient coming from: Home & is able to ambulate  Chief Complaint: Vaginal bleeding  HPI: Judith Drake is a 48 y.o. female with medical history significant for chronic anemia, hypertension, alcohol/tobacco abuse, GERD presents to the ED complaining of significant vaginal bleeding that has been ongoing for years, but got worse for the past 3 months.  Patient has a long history of heavy periods that lasts for about 12 days. Periods are not painful, but significantly heavy.  Prior to presentation to the ED patient passed large blood clots with excessive bleeding.  Patient also reports generalized weakness.  Patient denies any chest pain, shortness of breath, cough, abdominal pain, rectal bleeding or dark stool, vaginal discharge or pain, nausea/vomiting, dysuria.  Patient lives in [place] and has been following up with OB/GYN over there, she has had recommendation to have an ablation but has never made out time to do that.   ED Course: Patient was noted to be tachycardic, other vital signs stable.  Patient noted to have a hemoglobin of 6.9, baseline around 13, also noted to have potassium of 2.6, magnesium of 1.4, pregnancy test negative.  Patient was given potassium and magnesium, given 25 mg of Premarin IV and oral Megace 40 mg daily.  Patient was typed and screened, given IV fluids and [facility] hospitalist was consulted for admission.  Review of Systems: Review of systems are otherwise negative   Past Medical History:  Diagnosis Date  . Anemia   . ETOH abuse   . Fibroid   . GERD (gastroesophageal reflux disease)   . Heavy menstrual period   . Hypertension    Past Surgical History:  Procedure Laterality Date  . ELBOW SURGERY     1997   . TUBAL LIGATION      Social History:  reports that she has been smoking cigarettes. She uses smokeless tobacco. She reports current alcohol use. She reports  that she does not use drugs.   Allergies  Allergen Reactions  . Amoxicillin Nausea And Vomiting    Has patient had a PCN reaction causing immediate rash, facial/tongue/throat swelling, SOB or lightheadedness with hypotension: No Has patient had a PCN reaction causing severe rash involving mucus membranes or skin necrosis: No Has patient had a PCN reaction that required hospitalization: No Has patient had a PCN reaction occurring within the last 10 years: No If all of the above answers are "NO", then may proceed with Cephalosporin use.     Family History  Problem Relation Age of Onset  . Cancer Mother 49       Lung   . Hyperlipidemia Mother   . Hypertension Mother   . Stroke Mother   . Kidney disease Mother   . Diabetes Mother   . Heart disease Mother   . Hyperlipidemia Father   . Autism Son   . Cancer Paternal Grandfather 96       Colon Cancer - died  . Breast cancer Neg Hx       Prior to Admission medications   Medication Sig Start Date End Date Taking? Authorizing Provider  amLODipine (NORVASC) 10 MG tablet Take 1 tablet (10 mg total) by mouth daily. 04/20/18  Yes Arnett, Yvetta Coder, FNP  ferrous sulfate 325 (65 FE) MG tablet TAKE 1 TABLET (325 MG TOTAL) BY MOUTH 2 (TWO) TIMES DAILY WITH A MEAL. 10/24/18  Yes Burnard Hawthorne, FNP  Multiple Vitamin (MULTIVITAMIN WITH MINERALS)  TABS tablet Take 1 tablet by mouth daily. 04/13/18  Yes Gouru, Illene Silver, MD  acetaminophen (TYLENOL) 325 MG tablet Take 2 tablets (650 mg total) by mouth every 6 (six) hours as needed for mild pain (or Fever >/= 101). Patient not taking: Reported on 06/09/2018 04/13/18   Nicholes Mango, MD  busPIRone (BUSPAR) 10 MG tablet TAKE 1 TABLET BY MOUTH THREE TIMES A DAY Patient not taking: No sig reported 04/28/18   Burnard Hawthorne, FNP  cyanocobalamin ([employer] VITAMIN B12) 1000 MCG tablet Take 1 tablet (1,000 mcg total) by mouth daily. Patient not taking: Reported on 03/18/2019 07/29/18   Burnard Hawthorne, FNP  folic  acid (FOLVITE) 1 MG tablet Take 1 tablet (1 mg total) by mouth daily. Patient not taking: Reported on 04/20/2018 04/13/18   Nicholes Mango, MD  gabapentin (NEURONTIN) 100 MG capsule Take 2 capsules (200 mg total) by mouth at bedtime. Patient not taking: Reported on 06/09/2018 02/08/18   Lyndal Pulley, DO  thiamine 100 MG tablet Take 1 tablet (100 mg total) by mouth daily. Patient not taking: Reported on 06/09/2018 04/13/18   Nicholes Mango, MD    Physical Exam: BP (!) 109/56 (BP Location: Left Arm)   Pulse (!) 102   Temp 97.9 F (36.6 C) (Oral)   Resp 18   Ht 5\' 3"  (1.6 m)   Wt 65.8 kg   SpO2 96%   BMI 25.69 kg/m   General: AAO x3, NAD Eyes: Normal ENT: Normal Neck: Supple Cardiovascular: S1, S2 present Respiratory: CTAB Abdomen: Soft, nontender, nondistended, bowel sounds present Skin: Normal Musculoskeletal: No pedal edema bilaterally Psychiatric: Normal mood Neurologic: No focal neurologic deficits noted          Labs on Admission:  Basic Metabolic Panel: Recent Labs  Lab 03/18/19 0009  NA 131*  K 2.6*  CL 92*  CO2 22  GLUCOSE 98  BUN 7  CREATININE 0.72  CALCIUM 7.9*   Liver Function Tests: No results for input(s): AST, ALT, ALKPHOS, BILITOT, PROT, ALBUMIN in the last 168 hours. No results for input(s): LIPASE, AMYLASE in the last 168 hours. No results for input(s): AMMONIA in the last 168 hours. CBC: Recent Labs  Lab 03/18/19 0009  WBC 10.5  NEUTROABS 6.4  HGB 6.9*  HCT 22.2*  MCV 96.5  PLT 308   Cardiac Enzymes: No results for input(s): CKTOTAL, CKMB, CKMBINDEX, TROPONINI in the last 168 hours.  BNP (last 3 results) No results for input(s): BNP in the last 8760 hours.  ProBNP (last 3 results) No results for input(s): PROBNP in the last 8760 hours.  CBG: No results for input(s): GLUCAP in the last 168 hours.  Radiological Exams on Admission: US Transvaginal Non-ob  Result Date: 03/18/2019 CLINICAL DATA:  Vaginal bleeding for 8 weeks. EXAM:  TRANSABDOMINAL AND TRANSVAGINAL ULTRASOUND OF PELVIS TECHNIQUE: Both transabdominal and transvaginal ultrasound examinations of the pelvis were performed. Transabdominal technique was performed for global imaging of the pelvis including uterus, ovaries, adnexal regions, and pelvic cul-de-sac. It was necessary to proceed with endovaginal exam following the transabdominal exam to visualize the uterus, endometrium and ovaries to better advantage. COMPARISON:  None FINDINGS: Uterus Measurements: 11.5 x 7.8 x 8.0 cm = volume: 375 mL. No masses. Hypervascular on color Doppler analysis. Heterogeneous echogenicity. Several small cystic spaces noted mostly along the junctional zone. Endometrium Thickness: 1.9 cm.  No focal abnormality visualized. Right ovary Measurements: 3.9 x 2.9 x 2.3 cm = volume: 13.8 mL. Normal appearance/no adnexal mass. Left ovary  Not visualized. Other findings No abnormal free fluid. IMPRESSION: 1. Endometrium is thickened to 1.9 cm. No endometrial mass. Heterogeneous appearance of the uterus with small cystic spaces, but no defined mass. If bleeding remains unresponsive to hormonal or medical therapy, focal lesion work-up with sonohysterogram should be considered. Endometrial biopsy should also be considered in pre-menopausal patients at high risk for endometrial carcinoma. (Ref: Radiological Reasoning: Algorithmic Workup of Abnormal Vaginal Bleeding with Endovaginal Sonography and Sonohysterography. AJR 2008; 096:G83-66) 2. No other abnormality.  Left ovary not visualized. Electronically Signed   By: Lajean Manes M.D.   On: 03/18/2019 05:56   US Pelvis Complete  Result Date: 03/18/2019 CLINICAL DATA:  Vaginal bleeding for 8 weeks. EXAM: TRANSABDOMINAL AND TRANSVAGINAL ULTRASOUND OF PELVIS TECHNIQUE: Both transabdominal and transvaginal ultrasound examinations of the pelvis were performed. Transabdominal technique was performed for global imaging of the pelvis including uterus, ovaries, adnexal  regions, and pelvic cul-de-sac. It was necessary to proceed with endovaginal exam following the transabdominal exam to visualize the uterus, endometrium and ovaries to better advantage. COMPARISON:  None FINDINGS: Uterus Measurements: 11.5 x 7.8 x 8.0 cm = volume: 375 mL. No masses. Hypervascular on color Doppler analysis. Heterogeneous echogenicity. Several small cystic spaces noted mostly along the junctional zone. Endometrium Thickness: 1.9 cm.  No focal abnormality visualized. Right ovary Measurements: 3.9 x 2.9 x 2.3 cm = volume: 13.8 mL. Normal appearance/no adnexal mass. Left ovary Not visualized. Other findings No abnormal free fluid. IMPRESSION: 1. Endometrium is thickened to 1.9 cm. No endometrial mass. Heterogeneous appearance of the uterus with small cystic spaces, but no defined mass. If bleeding remains unresponsive to hormonal or medical therapy, focal lesion work-up with sonohysterogram should be considered. Endometrial biopsy should also be considered in pre-menopausal patients at high risk for endometrial carcinoma. (Ref: Radiological Reasoning: Algorithmic Workup of Abnormal Vaginal Bleeding with Endovaginal Sonography and Sonohysterography. AJR 2008; 294:[id number]) 2. No other abnormality.  Left ovary not visualized. Electronically Signed   By: Lajean Manes M.D.   On: 03/18/2019 05:56    EKG: Independently reviewed.  Sinus tachycardia  Assessment/Plan Present on Admission: . Symptomatic anemia . HTN (hypertension) . Alcohol abuse . Tobacco abuse . Depression . Heavy menstrual period . Hypokalemia  Principal Problem:   Symptomatic anemia Active Problems:   HTN (hypertension)   Alcohol abuse   Tobacco abuse   Depression   Heavy menstrual period   Hypokalemia  Symptomatic anemia likely 2/2 vaginal bleeding 2/2 ?DUB vs ?fibroids Hemoglobin 6.9 on presentation, and around 13 Pregnancy test negative, UA negative INR 1.3 Pelvic ultrasound showed endometrium is thickened to 1.9  cm, no endometrial mass seen Status post IV Premarin given, Megace Transfuse 2 units PRBC IV fluids Spoke to GYN: Agree with the above plan, if patient has another bleed, can give another dose of Premarin.  Discharge patient on twice daily Megace until patient can see a GYN physician as an outpatient.  No need for GYN inpatient visit Monitor closely  Hypokalemia/hypomagnesemia Replace PRN  Hypertension Stable Hold home amlodipine for now  Alcohol abuse Daily drinker, advised to quit CIWA protocol  Tobacco abuse Nicotine patch ordered Advised to quit    DVT prophylaxis: SCDs  Code Status: Full  Family Communication:  None at beside  Disposition Plan: To be determined  Consults called: Oby/Gyn  Admission status: Observation    Alma Friendly MD [facility]   If 7PM-7AM, please contact night-coverage [email or web address]  03/18/2019, 10:01 AM

## 2019-03-18 NOTE — ED Notes (Signed)
Attempted to give report. Rn unavailable and will call back

## 2019-03-18 NOTE — Plan of Care (Signed)

## 2019-03-18 NOTE — ED Notes (Signed)
US at bedside

## 2019-03-19 ENCOUNTER — Encounter (HOSPITAL_COMMUNITY): Payer: Self-pay | Admitting: *Deleted

## 2019-03-19 LAB — BASIC METABOLIC PANEL
Anion gap: 7 (ref 5–15)
BUN: 8 mg/dL (ref 6–20)
CO2: 27 mmol/L (ref 22–32)
Calcium: 8.3 mg/dL — ABNORMAL LOW (ref 8.9–10.3)
Chloride: 104 mmol/L (ref 98–111)
Creatinine, Ser: 0.57 mg/dL (ref 0.44–1.00)
GFR calc Af Amer: >60 mL/min (ref >60)
GFR calc non Af Amer: >60 mL/min (ref >60)
Glucose, Bld: 101 mg/dL — ABNORMAL HIGH (ref 70–99)
Potassium: 3.5 mmol/L (ref 3.5–5.1)
Sodium: 138 mmol/L (ref 135–145)

## 2019-03-19 LAB — BPAM RBC
Blood Product Expiration Date: 202007252359
Blood Product Expiration Date: 202008062359
ISSUE DATE / TIME: 202007111044
ISSUE DATE / TIME: 202007111335
Unit Type and Rh: 6200
Unit Type and Rh: 6200

## 2019-03-19 LAB — TYPE AND SCREEN
ABO/RH(D): A POS
Antibody Screen: NEGATIVE
Unit division: 0
Unit division: 0

## 2019-03-19 LAB — CBC
HCT: 25.1 % — ABNORMAL LOW (ref 36.0–46.0)
Hemoglobin: 7.9 g/dL — ABNORMAL LOW (ref 12.0–15.0)
MCH: 29.3 pg (ref 26.0–34.0)
MCHC: 31.5 g/dL (ref 30.0–36.0)
MCV: 93 fL (ref 80.0–100.0)
Platelets: 210 10*3/uL (ref 150–400)
RBC: 2.7 MIL/uL — ABNORMAL LOW (ref 3.87–5.11)
RDW: 17.4 % — ABNORMAL HIGH (ref 11.5–15.5)
WBC: 7.8 10*3/uL (ref 4.0–10.5)
nRBC: 0 % (ref 0.0–0.2)

## 2019-03-19 LAB — MAGNESIUM: Magnesium: 1.9 mg/dL (ref 1.7–2.4)

## 2019-03-19 MED ORDER — FERROUS SULFATE 325 (65 FE) MG PO TABS
325.0000 mg | ORAL_TABLET | Freq: Every day | ORAL | Status: DC
  Administered 2019-03-20 – 2019-03-21 (×2): 325 mg via ORAL
  Filled 2019-03-19 (×2): qty 1

## 2019-03-19 MED ORDER — STERILE WATER FOR INJECTION IJ SOLN
INTRAMUSCULAR | Status: AC
  Administered 2019-03-19: 18:00:00
  Filled 2019-03-19: qty 10

## 2019-03-19 MED ORDER — MEGESTROL ACETATE 40 MG PO TABS
120.0000 mg | ORAL_TABLET | Freq: Every day | ORAL | Status: DC
  Administered 2019-03-19 – 2019-03-21 (×3): 120 mg via ORAL
  Filled 2019-03-19 (×3): qty 3

## 2019-03-19 MED ORDER — SENNOSIDES-DOCUSATE SODIUM 8.6-50 MG PO TABS
1.0000 | ORAL_TABLET | Freq: Two times a day (BID) | ORAL | Status: DC
  Administered 2019-03-19 – 2019-03-21 (×5): 1 via ORAL
  Filled 2019-03-19 (×5): qty 1

## 2019-03-19 MED ORDER — ESTROGENS CONJUGATED 25 MG IJ SOLR
25.0000 mg | Freq: Four times a day (QID) | INTRAMUSCULAR | Status: DC
  Administered 2019-03-19 – 2019-03-20 (×3): 25 mg via INTRAVENOUS
  Filled 2019-03-19 (×4): qty 25

## 2019-03-19 MED ORDER — POTASSIUM CHLORIDE CRYS ER 20 MEQ PO TBCR
40.0000 meq | EXTENDED_RELEASE_TABLET | Freq: Once | ORAL | Status: AC
  Administered 2019-03-19: 40 meq via ORAL
  Filled 2019-03-19: qty 2

## 2019-03-19 NOTE — Progress Notes (Signed)
PROGRESS NOTE  Judith Drake DQQ:[id number] DOB: 11/11/1970 DOA: 03/17/2019 PCP: Burnard Hawthorne, FNP  HPI/Recap of past 24 hours:  She continue to pass large amount of clots, needs to change pads every hr, she reports feeling weak, she does not wants to go home today  No fever  Assessment/Plan: Principal Problem:   Symptomatic anemia Active Problems:   HTN (hypertension)   Alcohol abuse   Tobacco abuse   Depression   Heavy menstrual period   Hypokalemia   Symptomatic anemia likely 2/2 vaginal bleeding 2/2 ?DUB vs ?fibroids Hemoglobin 6.9 on presentation, and around 13 Pregnancy test negative, UA negative INR 1.3 Pelvic ultrasound showed endometrium is thickened to 1.9 cm, no endometrial mass seen Status post IV Premarin given, Megace Transfuse 2 units PRBC, monitor hgb She continued to have significant bleeding, case discussed with Gyn Dr Elonda Husky who recommended premarin 25mg  Iv q6hrs, megace 120mg  daily, GYN will see formally consult on 7/13.  Hypokalemia/hypomagnesemia Replace to keep mag>2, k>4  Hypertension Stable off home amlodipine  monitor  Alcohol abuse Daily drinker, advised to quit CIWA protocol  Tobacco abuse Nicotine patch ordered Advised to quit   Code Status: full  Family Communication: patient   Disposition Plan: needs gyn clearance    Consultants:  Building services engineer Dr Elonda Husky  Procedures:  prbc transfusion x2  Antibiotics:  none   Objective: BP 129/74 (BP Location: Left Arm)   Pulse 84   Temp 98.9 F (37.2 C) (Oral)   Resp 14   Ht 5\' 3"  (1.6 m)   Wt 65.8 kg   SpO2 97%   BMI 25.69 kg/m   Intake/Output Summary (Last 24 hours) at 03/19/2019 1347 Last data filed at 03/19/2019 0910 Gross per 24 hour  Intake 440 ml  Output -  Net 440 ml   Filed Weights   03/17/19 2347  Weight: 65.8 kg    Exam: Patient is examined daily including today on 03/19/2019, exams remain the same as of yesterday except that has  changed    General:  NAD  Cardiovascular: RRR  Respiratory: CTABL  Abdomen: Soft/ND/NT, positive BS  Musculoskeletal: No Edema  Neuro: alert, oriented   Data Reviewed: Basic Metabolic Panel: Recent Labs  Lab 03/18/19 0009 03/19/19 0536  NA 131*  --   K 2.6*  --   CL 92*  --   CO2 22  --   GLUCOSE 98  --   BUN 7  --   CREATININE 0.72  --   CALCIUM 7.9*  --   MG  --  1.9   Liver Function Tests: No results for input(s): AST, ALT, ALKPHOS, BILITOT, PROT, ALBUMIN in the last 168 hours. No results for input(s): LIPASE, AMYLASE in the last 168 hours. No results for input(s): AMMONIA in the last 168 hours. CBC: Recent Labs  Lab 03/18/19 0009 03/18/19 2038 03/19/19 0536  WBC 10.5  --  7.8  NEUTROABS 6.4  --   --   HGB 6.9* 8.2* 7.9*  HCT 22.2* 26.8* 25.1*  MCV 96.5  --  93.0  PLT 308  --  210   Cardiac Enzymes:   No results for input(s): CKTOTAL, CKMB, CKMBINDEX, TROPONINI in the last 168 hours. BNP (last 3 results) No results for input(s): BNP in the last 8760 hours.  ProBNP (last 3 results) No results for input(s): PROBNP in the last 8760 hours.  CBG: No results for input(s): GLUCAP in the last 168 hours.  Recent Results (from the past  240 hour(s))  Wet prep, genital     Status: None   Collection Time: 03/18/19 12:10 AM   Specimen: Cervical/Vaginal swab  Result Value Ref Range Status   Yeast Wet Prep HPF POC NONE SEEN NONE SEEN Final   Trich, Wet Prep NONE SEEN NONE SEEN Final   Clue Cells Wet Prep HPF POC NONE SEEN NONE SEEN Final   WBC, Wet Prep HPF POC NONE SEEN NONE SEEN Final   Sperm NONE SEEN  Final    Comment: Performed at [facility], [place] [address] [place], [place] [address]  SARS Coronavirus 2 (CEPHEID - Performed in [place] hospital lab), Hosp Order     Status: None   Collection Time: 03/18/19  3:27 AM   Specimen: Nasopharyngeal Swab  Result Value Ref Range Status   SARS Coronavirus 2 NEGATIVE NEGATIVE Final     Comment: (NOTE) If result is NEGATIVE SARS-CoV-2 target nucleic acids are NOT DETECTED. The SARS-CoV-2 RNA is generally detectable in upper and lower  respiratory specimens during the acute phase of infection. The lowest  concentration of SARS-CoV-2 viral copies this assay can detect is 250  copies / mL. A negative result does not preclude SARS-CoV-2 infection  and should not be used as the sole basis for treatment or other  patient management decisions.  A negative result may occur with  improper specimen collection / handling, submission of specimen other  than nasopharyngeal swab, presence of viral mutation(s) within the  areas targeted by this assay, and inadequate number of viral copies  (<250 copies / mL). A negative result must be combined with clinical  observations, patient history, and epidemiological information. If result is POSITIVE SARS-CoV-2 target nucleic acids are DETECTED. The SARS-CoV-2 RNA is generally detectable in upper and lower  respiratory specimens dur ing the acute phase of infection.  Positive  results are indicative of active infection with SARS-CoV-2.  Clinical  correlation with patient history and other diagnostic information is  necessary to determine patient infection status.  Positive results do  not rule out bacterial infection or co-infection with other viruses. If result is PRESUMPTIVE POSTIVE SARS-CoV-2 nucleic acids MAY BE PRESENT.   A presumptive positive result was obtained on the submitted specimen  and confirmed on repeat testing.  While 2019 novel coronavirus  (SARS-CoV-2) nucleic acids may be present in the submitted sample  additional confirmatory testing may be necessary for epidemiological  and / or clinical management purposes  to differentiate between  SARS-CoV-2 and other Sarbecovirus currently known to infect humans.  If clinically indicated additional testing with an alternate test  methodology [phone]) is advised. The SARS-CoV-2  RNA is generally  detectable in upper and lower respiratory sp ecimens during the acute  phase of infection. The expected result is Negative. Fact Sheet for Patients:  StrictlyIdeas.no Fact Sheet for Healthcare Providers: BankingDealers.co.za This test is not yet approved or cleared by the [place] FDA and has been authorized for detection and/or diagnosis of SARS-CoV-2 by FDA under an Emergency Use Authorization (EUA).  This EUA will remain in effect (meaning this test can be used) for the duration of the COVID-19 declaration under Section 564(b)(1) of the Act, 21 U.S.C. section 360bbb-3(b)(1), unless the authorization is terminated or revoked sooner. Performed at [facility], [place] [address] [place], [place] [address]      Studies: No results found.  Scheduled Meds: . conjugated estrogens  25 mg Intravenous Once  . conjugated estrogens  25 mg  Intravenous Q6H  . ferrous sulfate  325 mg Oral BID WC  . folic acid  1 mg Oral Daily  . LORazepam  0-4 mg Intravenous Q6H   Followed by  . [START ON 03/20/2019] LORazepam  0-4 mg Intravenous Q12H  . megestrol  120 mg Oral Daily  . multivitamin with minerals  1 tablet Oral Daily  . nicotine  21 mg Transdermal Daily  . potassium chloride  40 mEq Oral Once  . potassium chloride  40 mEq Oral Once  . thiamine  100 mg Oral Daily   Or  . thiamine  100 mg Intravenous Daily  . cyanocobalamin  1,000 mcg Oral Daily    Continuous Infusions: . magnesium sulfate bolus IVPB       Time spent: 39mins, case discussed with gyn Dr Elonda Husky over the phone I have personally reviewed and interpreted on  03/19/2019 daily labs, tele strips, imagings as discussed above under date review session and assessment and plans.  I reviewed all nursing notes, pharmacy notes,   vitals, pertinent old records  I have discussed plan of care as described above with RN , patient  on 03/19/2019    Florencia Reasons MD, PhD  [facility] Pager [phone]. If 7PM-7AM, please contact night-coverage at [email or web address], password [facility] 03/19/2019, 1:47 PM  LOS: 0 days

## 2019-03-19 NOTE — Plan of Care (Signed)

## 2019-03-19 NOTE — Progress Notes (Signed)
Pt reports large amount of blood clots noted on pads and in toilet. Called this RN to room. Large amount of red discharge noted in toilet. Pt provided clean pads. Will continue to monitor.

## 2019-03-20 ENCOUNTER — Telehealth: Payer: Self-pay | Admitting: Radiology

## 2019-03-20 ENCOUNTER — Encounter (HOSPITAL_COMMUNITY): Payer: Self-pay | Admitting: Obstetrics and Gynecology

## 2019-03-20 DIAGNOSIS — N946 Dysmenorrhea, unspecified: Secondary | ICD-10-CM | POA: Diagnosis present

## 2019-03-20 DIAGNOSIS — N939 Abnormal uterine and vaginal bleeding, unspecified: Secondary | ICD-10-CM

## 2019-03-20 HISTORY — DX: Abnormal uterine and vaginal bleeding, unspecified: N93.9

## 2019-03-20 HISTORY — DX: Dysmenorrhea, unspecified: N94.6

## 2019-03-20 LAB — COMPREHENSIVE METABOLIC PANEL
ALT: 18 U/L (ref 0–44)
AST: 27 U/L (ref 15–41)
Albumin: 3.4 g/dL — ABNORMAL LOW (ref 3.5–5.0)
Alkaline Phosphatase: 38 U/L (ref 38–126)
Anion gap: 9 (ref 5–15)
BUN: 7 mg/dL (ref 6–20)
CO2: 25 mmol/L (ref 22–32)
Calcium: 8.6 mg/dL — ABNORMAL LOW (ref 8.9–10.3)
Chloride: 105 mmol/L (ref 98–111)
Creatinine, Ser: 0.57 mg/dL (ref 0.44–1.00)
GFR calc Af Amer: >60 mL/min (ref >60)
GFR calc non Af Amer: >60 mL/min (ref >60)
Glucose, Bld: 91 mg/dL (ref 70–99)
Potassium: 3.6 mmol/L (ref 3.5–5.1)
Sodium: 139 mmol/L (ref 135–145)
Total Bilirubin: 0.4 mg/dL (ref 0.3–1.2)
Total Protein: 6.1 g/dL — ABNORMAL LOW (ref 6.5–8.1)

## 2019-03-20 LAB — CBC WITH DIFFERENTIAL/PLATELET
Abs Immature Granulocytes: 0.05 10*3/uL (ref 0.00–0.07)
Basophils Absolute: 0.1 10*3/uL (ref 0.0–0.1)
Basophils Relative: 1 %
Eosinophils Absolute: 0.4 10*3/uL (ref 0.0–0.5)
Eosinophils Relative: 4 %
HCT: 24.3 % — ABNORMAL LOW (ref 36.0–46.0)
Hemoglobin: 7.2 g/dL — ABNORMAL LOW (ref 12.0–15.0)
Immature Granulocytes: 1 %
Lymphocytes Relative: 25 %
Lymphs Abs: 2.6 10*3/uL (ref 0.7–4.0)
MCH: 29.1 pg (ref 26.0–34.0)
MCHC: 29.6 g/dL — ABNORMAL LOW (ref 30.0–36.0)
MCV: 98.4 fL (ref 80.0–100.0)
Monocytes Absolute: 1 10*3/uL (ref 0.1–1.0)
Monocytes Relative: 10 %
Neutro Abs: 6.1 10*3/uL (ref 1.7–7.7)
Neutrophils Relative %: 59 %
Platelets: 239 10*3/uL (ref 150–400)
RBC: 2.47 MIL/uL — ABNORMAL LOW (ref 3.87–5.11)
RDW: 17.2 % — ABNORMAL HIGH (ref 11.5–15.5)
WBC: 10.3 10*3/uL (ref 4.0–10.5)
nRBC: 0 % (ref 0.0–0.2)

## 2019-03-20 LAB — CBC
HCT: 26.8 % — ABNORMAL LOW (ref 36.0–46.0)
Hemoglobin: 8.1 g/dL — ABNORMAL LOW (ref 12.0–15.0)
MCH: 29.3 pg (ref 26.0–34.0)
MCHC: 30.2 g/dL (ref 30.0–36.0)
MCV: 97.1 fL (ref 80.0–100.0)
Platelets: 288 10*3/uL (ref 150–400)
RBC: 2.76 MIL/uL — ABNORMAL LOW (ref 3.87–5.11)
RDW: 17.6 % — ABNORMAL HIGH (ref 11.5–15.5)
WBC: 10.5 10*3/uL (ref 4.0–10.5)
nRBC: 0.2 % (ref 0.0–0.2)

## 2019-03-20 LAB — MAGNESIUM: Magnesium: 1.9 mg/dL (ref 1.7–2.4)

## 2019-03-20 NOTE — Consult Note (Signed)
Obstetrics & Gynecology Consult NOte   Date of Consult: 03/20/2019   Requesting Provider: [facility]  Primary OBGYN: None (Saw [facility] in [place] 4-5 years ago per patient report Primary Care Provider: [facility]  Reason for Consult: anemia, AUB  History of Present Illness: Judith Drake is a 48 y.o. [id number] (No LMP recorded.), with the above CC. PMHx is significant for HTN, BMI 26, tobacco abuse, h/o BTL, ETOH abuse  Patient states she has a long standing h/o AUB and that her periods have been qmonth, regular but lasts for up to two weeks for many years. In the past two months, she's had bleeding almost every day and it worsened to the point to she came in for evaluation; she was admitted for this in April 2019 and received two units PRBCs; at that time she wasn't bleeding but was anemic. She wasn't discharged on any gyn meds nor received any while in patient.   The H&P from this hospitalization states she received a dose of IV premarin but I don't see it as being given. She was started on megace 40 bid but her bleeding worsened on 7/12. GYN was consulted and phone recommendations given, with plans for formal consult the next. She was started on IV premarin 25 q6h and her megace increased to 120 qday. She has already received two units PRBCs. Her last dose of premarin was at 0500 today.   She currently states she is only having spotting today. +hot flashes and night sweats for several months. She denies any h/o migraines or VTE  ROS: A 12-point review of systems was performed and negative, except as stated in the above HPI.  OBGYN History: As per HPI. OB History  Gravida Para Term Preterm AB Living  2 2 2     2   SAB TAB Ectopic Multiple Live Births          2    # Outcome Date GA Lbr Len/2nd Weight Sex Delivery Anes PTL Lv  2 Term           1 Term             Obstetric Comments  Vaginal x 2   Periods: as per HPI History of pap smears: Yes. Last pap  smear unsure but no h/o abnormal paps per patient HRT use: no  Past Medical History: Past Medical History:  Diagnosis Date  . Anemia   . ETOH abuse   . Fibroid   . GERD (gastroesophageal reflux disease)   . Heavy menstrual period   . Hypertension     Past Surgical History: Past Surgical History:  Procedure Laterality Date  . ELBOW SURGERY     1997   . TUBAL LIGATION      Family History:  Family History  Problem Relation Age of Onset  . Cancer Mother 59       Lung   . Hyperlipidemia Mother   . Hypertension Mother   . Stroke Mother   . Kidney disease Mother   . Diabetes Mother   . Heart disease Mother   . Hyperlipidemia Father   . Autism Son   . Cancer Paternal Grandfather 48       Colon Cancer - died  . Breast cancer Neg Hx    She denies any female cancers, bleeding or blood clotting disorders.   Social History:  Social History   Socioeconomic History  . Marital status: Married    Spouse name: Lanny Hurst  .  Number of children: 2  . Years of education: 71  . Highest education level: Not on file  Occupational History  . Occupation: Agricultural engineer   Social Needs  . Financial resource strain: Not on file  . Food insecurity    Worry: Not on file    Inability: Not on file  . Transportation needs    Medical: Not on file    Non-medical: Not on file  Tobacco Use  . Smoking status: Light Tobacco Smoker    Types: Cigarettes  . Smokeless tobacco: Current User  Substance and Sexual Activity  . Alcohol use: Yes  . Drug use: No  . Sexual activity: Yes    Partners: Male    Birth control/protection: Surgical  Lifestyle  . Physical activity    Days per week: Not on file    Minutes per session: Not on file  . Stress: Not on file  Relationships  . Social Herbalist on phone: Not on file    Gets together: Not on file    Attends religious service: Not on file    Active member of club or organization: Not on file    Attends meetings of clubs or organizations:  Not on file    Relationship status: Not on file  . Intimate partner violence    Fear of current or ex partner: Not on file    Emotionally abused: Not on file    Physically abused: Not on file    Forced sexual activity: Not on file  Other Topics Concern  . Not on file  Social History Narrative   Callahan grew up in [place]. She attended [employer] for a certificate in accounting. She currently lives a home with her husband and 2 boys. Her oldest son has autism. She enjoys going out with her family.     Allergy: Allergies  Allergen Reactions  . Amoxicillin Nausea And Vomiting    Has patient had a PCN reaction causing immediate rash, facial/tongue/throat swelling, SOB or lightheadedness with hypotension: No Has patient had a PCN reaction causing severe rash involving mucus membranes or skin necrosis: No Has patient had a PCN reaction that required hospitalization: No Has patient had a PCN reaction occurring within the last 10 years: No If all of the above answers are "NO", then may proceed with Cephalosporin use.     Current Outpatient Medications: Medications Prior to Admission  Medication Sig Dispense Refill Last Dose  . amLODipine (NORVASC) 10 MG tablet Take 1 tablet (10 mg total) by mouth daily. 90 tablet 3 03/17/2019 at Unknown time  . ferrous sulfate 325 (65 FE) MG tablet TAKE 1 TABLET (325 MG TOTAL) BY MOUTH 2 (TWO) TIMES DAILY WITH A MEAL. 180 tablet 0 Past Week at Unknown time  . Multiple Vitamin (MULTIVITAMIN WITH MINERALS) TABS tablet Take 1 tablet by mouth daily.   Past Week at Unknown time  . acetaminophen (TYLENOL) 325 MG tablet Take 2 tablets (650 mg total) by mouth every 6 (six) hours as needed for mild pain (or Fever >/= 101). (Patient not taking: Reported on 06/09/2018)   Not Taking at Unknown time  . busPIRone (BUSPAR) 10 MG tablet TAKE 1 TABLET BY MOUTH THREE TIMES A DAY (Patient not taking: No sig reported) 270 tablet 2 Not Taking at Unknown time  .  cyanocobalamin ([employer] VITAMIN B12) 1000 MCG tablet Take 1 tablet (1,000 mcg total) by mouth daily. (Patient not taking: Reported on 03/18/2019) 90 tablet 1 Not Taking at  Unknown time  . folic acid (FOLVITE) 1 MG tablet Take 1 tablet (1 mg total) by mouth daily. (Patient not taking: Reported on 04/20/2018) 30 tablet 0 Not Taking at Unknown time  . gabapentin (NEURONTIN) 100 MG capsule Take 2 capsules (200 mg total) by mouth at bedtime. (Patient not taking: Reported on 06/09/2018) 60 capsule 3 Not Taking at Unknown time  . thiamine 100 MG tablet Take 1 tablet (100 mg total) by mouth daily. (Patient not taking: Reported on 06/09/2018)   Not Taking at Unknown time     Hospital Medications: Current Facility-Administered Medications  Medication Dose Route Frequency Provider Last Rate Last Dose  . conjugated estrogens (PREMARIN) injection 25 mg  25 mg Intravenous Once Ivor Costa, MD      . ferrous sulfate tablet 325 mg  325 mg Oral Q breakfast Florencia Reasons, MD   325 mg at 03/20/19 6789  . folic acid (FOLVITE) tablet 1 mg  1 mg Oral Daily Ivor Costa, MD   1 mg at 03/20/19 3810  . LORazepam (ATIVAN) injection 0-4 mg  0-4 mg Intravenous Q12H Ivor Costa, MD      . LORazepam (ATIVAN) tablet 1 mg  1 mg Oral Q6H PRN Ivor Costa, MD       Or  . LORazepam (ATIVAN) injection 1 mg  1 mg Intravenous Q6H PRN Ivor Costa, MD      . magnesium sulfate IVPB 2 g 50 mL  2 g Intravenous Once Ivor Costa, MD      . megestrol (MEGACE) tablet 120 mg  120 mg Oral Daily Florian Buff, MD   120 mg at 03/20/19 1751  . multivitamin with minerals tablet 1 tablet  1 tablet Oral Daily Ivor Costa, MD   1 tablet at 03/20/19 0258  . nicotine (NICODERM CQ - dosed in mg/24 hours) patch 21 mg  21 mg Transdermal Daily Ivor Costa, MD      . ondansetron [facility] 2) tablet 4 mg  4 mg Oral Q6H PRN Ivor Costa, MD       Or  . ondansetron (ZOFRAN) injection 4 mg  4 mg Intravenous Q6H PRN Ivor Costa, MD      . potassium chloride SA (K-DUR) CR tablet 40 mEq   40 mEq Oral Once Ivor Costa, MD      . senna-docusate (Senokot-S) tablet 1 tablet  1 tablet Oral BID Florencia Reasons, MD   1 tablet at 03/20/19 [phone]  . thiamine (VITAMIN B-1) tablet 100 mg  100 mg Oral Daily Ivor Costa, MD   100 mg at 03/20/19 8242   Or  . thiamine (B-1) injection 100 mg  100 mg Intravenous Daily Ivor Costa, MD      . traMADol Veatrice Bourbon) tablet 50 mg  50 mg Oral Q6H PRN Alma Friendly, MD   50 mg at 03/20/19 3536  . vitamin B-12 (CYANOCOBALAMIN) tablet 1,000 mcg  1,000 mcg Oral Daily Ivor Costa, MD   1,000 mcg at 03/20/19 1443     Physical Exam:   Current Vital Signs 24h Vital Sign Ranges  T 98.7 F (37.1 C) Temp  Avg: 98.8 F (37.1 C)  Min: 98.7 F (37.1 C)  Max: 98.9 F (37.2 C)  BP 122/78 BP  Min: 122/78  Max: 132/88  HR 83 Pulse  Avg: 85  Min: 83  Max: 88  RR 17 Resp  Avg: 15.7  Min: 14  Max: 17  SaO2 100 % Room Air SpO2  Avg: 96.8 %  Min:  93 %  Max: 100 %       24 Hour I/O Current Shift I/O  Time Ins Outs 07/12 0701 - 07/13 0700 In: 200 [P.O.:200] Out: -  No intake/output data recorded.   Patient Vitals for the past 24 hrs:  BP Temp Temp src Pulse Resp SpO2  03/20/19 0455 122/78 98.7 F (37.1 C) - 83 17 100 %  03/19/19 2126 132/88 98.9 F (37.2 C) Oral 88 16 97 %  03/19/19 1500 - - - - - 93 %  03/19/19 1305 129/74 98.9 F (37.2 C) Oral 84 14 97 %    Body mass index is 25.69 kg/m. General appearance: Well nourished, well developed female in no acute distress.  Cardiovascular: S1, S2 normal, no murmur, rub or gallop, regular rate and rhythm Respiratory:  Clear to auscultation bilateral. Normal respiratory effort Abdomen: positive bowel sounds and no masses, hernias; diffusely non tender to palpation, non distended Neuro/Psych:  Normal mood and affect.  Skin:  Warm and dry.  Extremities: no clubbing, cyanosis, or edema.   Pelvic exam: deferred Per ED exam   Comments: Normal external genitalia. No pain with speculum insertion. Closed cervical os  with normal appearance - no rash or lesions. No significant discharge noted from cervix or in vaginal vault.  Leading seen in vaginal vault, no clots noted. On bimanual examination no adnexal tenderness or cervical motion tenderness. Chaperone NT present during exam.  Laboratory: Pending: GC/CT, HIV, RPR  Recent Labs  Lab 03/18/19 0009 03/18/19 2038 03/19/19 0536 03/20/19 0541  WBC 10.5  --  7.8 10.3  HGB 6.9* 8.2* 7.9* 7.2*  HCT 22.2* 26.8* 25.1* 24.3*  PLT 308  --  210 239   Recent Labs  Lab 03/18/19 0009 03/19/19 0536 03/20/19 0541  NA 131* 138 139  K 2.6* 3.5 3.6  CL 92* 104 105  CO2 22 27 25   BUN 7 8 7   CREATININE 0.72 0.57 0.57  CALCIUM 7.9* 8.3* 8.6*  PROT  --   --  6.1*  BILITOT  --   --  0.4  ALKPHOS  --   --  38  ALT  --   --  18  AST  --   --  27  GLUCOSE 98 101* 91   Recent Labs  Lab 03/18/19 0818  APTT 30  INR 1.3*   Recent Labs  Lab 03/18/19 0103  ABORH A POS  A POS Performed at [facility], [place] [address], [place], [place] [address]     Imaging:  CLINICAL DATA:  Vaginal bleeding for 8 weeks.  EXAM: TRANSABDOMINAL AND TRANSVAGINAL ULTRASOUND OF PELVIS  TECHNIQUE: Both transabdominal and transvaginal ultrasound examinations of the pelvis were performed. Transabdominal technique was performed for global imaging of the pelvis including uterus, ovaries, adnexal regions, and pelvic cul-de-sac. It was necessary to proceed with endovaginal exam following the transabdominal exam to visualize the uterus, endometrium and ovaries to better advantage.  COMPARISON:  None  FINDINGS: Uterus  Measurements: 11.5 x 7.8 x 8.0 cm = volume: 375 mL. No masses. Hypervascular on color Doppler analysis. Heterogeneous echogenicity. Several small cystic spaces noted mostly along the junctional zone.  Endometrium  Thickness: 1.9 cm.  No focal abnormality visualized.  Right ovary  Measurements: 3.9 x 2.9 x 2.3 cm = volume:  13.8 mL. Normal appearance/no adnexal mass.  Left ovary  Not visualized.  Other findings  No abnormal free fluid.  IMPRESSION: 1. Endometrium is thickened to 1.9 cm. No endometrial  mass. Heterogeneous appearance of the uterus with small cystic spaces, but no defined mass. If bleeding remains unresponsive to hormonal or medical therapy, focal lesion work-up with sonohysterogram should be considered. Endometrial biopsy should also be considered in pre-menopausal patients at high risk for endometrial carcinoma. (Ref: Radiological Reasoning: Algorithmic Workup of Abnormal Vaginal Bleeding with Endovaginal Sonography and Sonohysterography. AJR 2008; [id number]:[id number]) 2. No other abnormality.  Left ovary not visualized.   Electronically Signed   By: Lajean Manes M.D.   On: 03/18/2019 05:56  Assessment: Ms. Skelley is a 48 y.o. G2P2002 (No LMP recorded.) with AUB, anemia. Pt stable  Plan: D/w pt that I would prefer to watch her overnight and if just spotting on the megace 120 qday (I stopped her IV premarin) then fine for follow up in our clinic within 10 days of d/c; pt to stay on the megace until a final plan is in place  I told her that we need to see her clinic to do a pap smear and endometrial biopsy to rule out malignancy. I d/w her re: medical and surgical options after leaving the hospital. I told her that if she desired a medical option that I'd recommend depo provera or Mirena IUD. If she desires surgical option, which I told her is what I recommend, there is hysteroscopy, d&c; hysteroscopy & endometrial ablation or hysterectomy. She is having menopausal s/s already, which is probably what's causing her aub, and she is the sole caregiver of her severely autistic 29 y/o son. Given this, I told her I'd lean towards an endometrial ablation since it would be a far faster recovery and it's help will bleeding will likely last until her bleeding stops naturally from menopause. I also  told her that a hysterectomy, likely could be done vaginally, if her exam in the office shows this and that her recovery would also be fast but she'd need help for her son for at least 2 weeks  She has no insurance and she is to talk to the financial counselor today re: applying for medicaid or the cone assistance program; I'll talk to her primary team to see how much megace will cost and about keeping her overnight. I also told her that if she's doing well and continuing with just spotting by dinner time tonight that I'd be fine with her going home then, too  Patient is amenable to plan   Total time taking care of the patient was 30 minutes, with greater than 50% of the time spent in face to face interaction with the patient.  Durene Romans MD Attending Center for [employer] (Faculty Practice)\

## 2019-03-20 NOTE — Telephone Encounter (Signed)
Left message for patient regarding appointment with Dr Ilda Basset on 03/28/19 @ 10:00

## 2019-03-20 NOTE — Progress Notes (Signed)
PROGRESS NOTE  Judith Drake QPY:[id number] DOB: 02-Jun-1971 DOA: 03/17/2019 PCP: Burnard Hawthorne, FNP  HPI/Recap of past 24 hours:  She continue to pass large amount of clots, needs to change pads every hr, she reports feeling weak, she does not wants to go home today  No fever  Assessment/Plan: Principal Problem:   Symptomatic anemia Active Problems:   HTN (hypertension)   Alcohol abuse   Tobacco abuse   Depression   Heavy menstrual period   Hypokalemia   Hypomagnesemia   Symptomatic anemia likely 2/2 vaginal bleeding 2/2 ?DUB vs ?fibroids: - Pregnancy test negative, UA negative - INR 1.3 - Pelvic ultrasound > endometrium thickened to 1.9 cm, no endometrial mass seen - Hb 6.9 on presentation here > up to 8.2 after prbc x 2, however continued to bleed heavily, case discussed with Gyn Dr Elonda Husky this weekend who recommended premarin 25mg  Iv q6hrs, megace 120mg  daily - GYN to see for formal consult on 7/13 today - Hb down to 7.2 this am - per patient bleeding has slowed significantly in the past 24 hrs - premarin IV dc'd this am, cont megace po  - CBC every 12 hrs   Hypokalemia/hypomagnesemia Replace to keep mag>2, k>4  Hypertension Stable off home amlodipine  monitor  Alcohol abuse Daily drinker, advised to quit CIWA protocol  Tobacco abuse Nicotine patch ordered Advised to quit   Code Status: full  Family Communication: patient   Disposition Plan: await gyn rec's   Kelly Splinter MD  pgr [phone] 03/20/2019, 10:55 AM      Consultants:  Gyn faculty practice Dr Elonda Husky  Procedures:  prbc transfusion x2  Antibiotics:  none   Objective: BP 122/78 (BP Location: Right Arm)   Pulse 83   Temp 98.7 F (37.1 C)   Resp 17   Ht 5\' 3"  (1.6 m)   Wt 65.8 kg   SpO2 100%   BMI 25.69 kg/m   Intake/Output Summary (Last 24 hours) at 03/20/2019 1046 Last data filed at 03/20/2019 0600 Gross per 24 hour  Intake 0 ml  Output -  Net 0 ml    Filed Weights   03/17/19 2347  Weight: 65.8 kg    Exam:   General:  NAD  Cardiovascular: RRR  Respiratory: CTABL  Abdomen: Soft/ND/NT, positive BS  Musculoskeletal: No Edema  Neuro: alert, oriented   Data Reviewed: Basic Metabolic Panel: Recent Labs  Lab 03/18/19 0009 03/19/19 0536 03/20/19 0541  NA 131* 138 139  K 2.6* 3.5 3.6  CL 92* 104 105  CO2 22 27 25   GLUCOSE 98 101* 91  BUN 7 8 7   CREATININE 0.72 0.57 0.57  CALCIUM 7.9* 8.3* 8.6*  MG  --  1.9 1.9   Liver Function Tests: Recent Labs  Lab 03/20/19 0541  AST 27  ALT 18  ALKPHOS 38  BILITOT 0.4  PROT 6.1*  ALBUMIN 3.4*   No results for input(s): LIPASE, AMYLASE in the last 168 hours. No results for input(s): AMMONIA in the last 168 hours. CBC: Recent Labs  Lab 03/18/19 0009 03/18/19 2038 03/19/19 0536 03/20/19 0541  WBC 10.5  --  7.8 10.3  NEUTROABS 6.4  --   --  6.1  HGB 6.9* 8.2* 7.9* 7.2*  HCT 22.2* 26.8* 25.1* 24.3*  MCV 96.5  --  93.0 98.4  PLT 308  --  210 239   Cardiac Enzymes:   No results for input(s): CKTOTAL, CKMB, CKMBINDEX, TROPONINI in the last 168 hours. BNP (last  3 results) No results for input(s): BNP in the last 8760 hours.  ProBNP (last 3 results) No results for input(s): PROBNP in the last 8760 hours.  CBG: No results for input(s): GLUCAP in the last 168 hours.  Recent Results (from the past 240 hour(s))  Wet prep, genital     Status: None   Collection Time: 03/18/19 12:10 AM   Specimen: Cervical/Vaginal swab  Result Value Ref Range Status   Yeast Wet Prep HPF POC NONE SEEN NONE SEEN Final   Trich, Wet Prep NONE SEEN NONE SEEN Final   Clue Cells Wet Prep HPF POC NONE SEEN NONE SEEN Final   WBC, Wet Prep HPF POC NONE SEEN NONE SEEN Final   Sperm NONE SEEN  Final    Comment: Performed at [facility], [place] [address], [place], [place] [address]  SARS Coronavirus 2 (CEPHEID - Performed in [place] hospital lab), Hosp Order     Status: None    Collection Time: 03/18/19  3:27 AM   Specimen: Nasopharyngeal Swab  Result Value Ref Range Status   SARS Coronavirus 2 NEGATIVE NEGATIVE Final    Comment: (NOTE) If result is NEGATIVE SARS-CoV-2 target nucleic acids are NOT DETECTED. The SARS-CoV-2 RNA is generally detectable in upper and lower  respiratory specimens during the acute phase of infection. The lowest  concentration of SARS-CoV-2 viral copies this assay can detect is 250  copies / mL. A negative result does not preclude SARS-CoV-2 infection  and should not be used as the sole basis for treatment or other  patient management decisions.  A negative result may occur with  improper specimen collection / handling, submission of specimen other  than nasopharyngeal swab, presence of viral mutation(s) within the  areas targeted by this assay, and inadequate number of viral copies  (<250 copies / mL). A negative result must be combined with clinical  observations, patient history, and epidemiological information. If result is POSITIVE SARS-CoV-2 target nucleic acids are DETECTED. The SARS-CoV-2 RNA is generally detectable in upper and lower  respiratory specimens dur ing the acute phase of infection.  Positive  results are indicative of active infection with SARS-CoV-2.  Clinical  correlation with patient history and other diagnostic information is  necessary to determine patient infection status.  Positive results do  not rule out bacterial infection or co-infection with other viruses. If result is PRESUMPTIVE POSTIVE SARS-CoV-2 nucleic acids MAY BE PRESENT.   A presumptive positive result was obtained on the submitted specimen  and confirmed on repeat testing.  While 2019 novel coronavirus  (SARS-CoV-2) nucleic acids may be present in the submitted sample  additional confirmatory testing may be necessary for epidemiological  and / or clinical management purposes  to differentiate between  SARS-CoV-2 and other Sarbecovirus  currently known to infect humans.  If clinically indicated additional testing with an alternate test  methodology [phone]) is advised. The SARS-CoV-2 RNA is generally  detectable in upper and lower respiratory sp ecimens during the acute  phase of infection. The expected result is Negative. Fact Sheet for Patients:  StrictlyIdeas.no Fact Sheet for Healthcare Providers: BankingDealers.co.za This test is not yet approved or cleared by the [place] FDA and has been authorized for detection and/or diagnosis of SARS-CoV-2 by FDA under an Emergency Use Authorization (EUA).  This EUA will remain in effect (meaning this test can be used) for the duration of the COVID-19 declaration under Section 564(b)(1) of the Act, 21 U.S.C. section 360bbb-3(b)(1), unless the authorization  is terminated or revoked sooner. Performed at [facility], [place] [address], [place], [place] [address]      Studies: No results found.  Scheduled Meds: . conjugated estrogens  25 mg Intravenous Once  . ferrous sulfate  325 mg Oral Q breakfast  . folic acid  1 mg Oral Daily  . LORazepam  0-4 mg Intravenous Q12H  . megestrol  120 mg Oral Daily  . multivitamin with minerals  1 tablet Oral Daily  . nicotine  21 mg Transdermal Daily  . potassium chloride  40 mEq Oral Once  . senna-docusate  1 tablet Oral BID  . thiamine  100 mg Oral Daily   Or  . thiamine  100 mg Intravenous Daily  . cyanocobalamin  1,000 mcg Oral Daily    Continuous Infusions: . magnesium sulfate bolus IVPB         [facility] Pager [phone]. If 7PM-7AM, please contact night-coverage at [email or web address], password [facility] 03/20/2019, 10:46 AM  LOS: 1 day

## 2019-03-21 DIAGNOSIS — N946 Dysmenorrhea, unspecified: Secondary | ICD-10-CM

## 2019-03-21 LAB — HIV ANTIBODY (ROUTINE TESTING W REFLEX): HIV Screen 4th Generation wRfx: NONREACTIVE

## 2019-03-21 LAB — GC/CHLAMYDIA PROBE AMP (~~LOC~~) NOT AT ARMC
Chlamydia: NEGATIVE
Neisseria Gonorrhea: NEGATIVE

## 2019-03-21 LAB — RPR: RPR Ser Ql: NONREACTIVE

## 2019-03-21 MED ORDER — TRAMADOL HCL 50 MG PO TABS: 50.0000 mg | ORAL_TABLET | Freq: Four times a day (QID) | ORAL | 0 refills | Status: AC | PRN

## 2019-03-21 MED ORDER — MEGESTROL ACETATE 40 MG PO TABS: 120.0000 mg | ORAL_TABLET | Freq: Every day | ORAL | 0 refills | Status: DC

## 2019-03-21 NOTE — Discharge Summary (Signed)
Physician Discharge Summary  Patient ID: Judith Drake MRN: [id number] DOB/AGE: [birth year] 48 y.o.  Admit date: 03/17/2019 Discharge date: 03/21/2019  Admission Diagnoses: Principal Problem:   Abnormal uterine bleeding (AUB)   Symptomatic anemia Active Problems:   HTN (hypertension)   Alcohol abuse   Tobacco abuse   Depression   Menorrhagia   Hypokalemia   Hypomagnesemia   Dysmenorrhea   Discharge Diagnoses:  Principal Problem:   Abnormal uterine bleeding (AUB)   Symptomatic anemia Active Problems:   HTN (hypertension)   Alcohol abuse   Tobacco abuse   Depression   Menorrhagia   Hypokalemia   Hypomagnesemia   Dysmenorrhea  Discharged Condition: good  HPI: Judith Drake is a 48 y.o. female with medical history significant for chronic anemia, hypertension, alcohol/tobacco abuse, GERD presents to the ED complaining of significant vaginal bleeding that has been ongoing for years, but got worse for the past 3 months.  Patient has a long history of heavy periods that lasts for about 12 days. Periods are not painful, but significantly heavy.  Prior to presentation to the ED patient passed large blood clots with excessive bleeding.  Patient also reports generalized weakness.  Patient denies any chest pain, shortness of breath, cough, abdominal pain, rectal bleeding or dark stool, vaginal discharge or pain, nausea/vomiting, dysuria.  Patient lives in [place] and has been following up with OB/GYN over there, she has had recommendation to have an ablation but has never made out time to do that.  ED Course: Patient was noted to be tachycardic, other vital signs stable.  Patient noted to have a hemoglobin of 6.9, baseline around 13, also noted to have potassium of 2.6, magnesium of 1.4, pregnancy test negative.  Patient was given potassium and magnesium, given 25 mg of Premarin IV and oral Megace 40 mg daily.  Patient was typed and screened, given IV fluids and triad hospitalist  was consulted for admission.   Hospital Course:  Symptomatic anemia likely 2/2 vaginal bleeding2/2 ?DUB vs ?fibroids: - Pregnancy test negative,UA negative - INR 1.3 - Pelvic ultrasound > endometrium thickened to 1.9 cm, no endometrial mass seen - Hb 6.9 on presentation here > up to 8.2 after prbc x 2, however continued to bleed heavily >> case discussed with Gyn Dr Elonda Husky this weekend who recommended premarin 25mg  Iv q6hrs, megace 120mg  daily - GYN Dr Ilda Basset saw patient 7/13 for GYN in consultation, appreciate assistacne, noted that bleeding had improved on q6 hr IV premarin + po megace 120 > bleeding remained stable overnight and Hb was up > 8 on day of dc - pt is to continue po megace (if can afford) and f/u w Dr Ilda Basset on 7/21 at 10am in the [place] office - will send pt w/ Rx for tramadol as well for her arthritis pains as she has been using OTC nsaids which has a blood thinning effects due to antiplatelet properties   Hypokalemia/hypomagnesemia better  Hypertension Resume home meds at dc  Alcohol abuse Daily drinker,advised to quit CIWA protocol no signs of withdrawal here  Tobacco abuse Nicotine patch ordered Advised to quit  Consultants:  Gyn faculty practice Dr C. Pickens  Procedures:  prbc transfusion x2  Antibiotics:  none    Discharge Exam: Blood pressure 124/80, pulse 79, temperature 98.6 F (37 C), temperature source Oral, resp. rate 16, height 5\' 3"  (1.6 m), weight 65.8 kg, SpO2 99 %. Alert, no distress  no jvd   chest cta bilat  Cor reg no mrg  Abd soft ntnd    Ext no edema   NF O x3  Disposition: Discharge disposition: 01-Home or Self Care        Allergies as of 03/21/2019      Reactions   Amoxicillin Nausea And Vomiting   Has patient had a PCN reaction causing immediate rash, facial/tongue/throat swelling, SOB or lightheadedness with hypotension: No Has patient had a PCN reaction causing severe rash involving mucus  membranes or skin necrosis: No Has patient had a PCN reaction that required hospitalization: No Has patient had a PCN reaction occurring within the last 10 years: No If all of the above answers are "NO", then may proceed with Cephalosporin use.      Medication List    TAKE these medications   acetaminophen 325 MG tablet Commonly known as: TYLENOL Take 2 tablets (650 mg total) by mouth every 6 (six) hours as needed for mild pain (or Fever >/= 101).   amLODipine 10 MG tablet Commonly known as: NORVASC Take 1 tablet (10 mg total) by mouth daily.   busPIRone 10 MG tablet Commonly known as: BUSPAR TAKE 1 TABLET BY MOUTH THREE TIMES A DAY   cyanocobalamin 1000 MCG tablet Commonly known as: [employer] VITAMIN B12 Take 1 tablet (1,000 mcg total) by mouth daily.   ferrous sulfate 325 (65 FE) MG tablet TAKE 1 TABLET (325 MG TOTAL) BY MOUTH 2 (TWO) TIMES DAILY WITH A MEAL.   folic acid 1 MG tablet Commonly known as: FOLVITE Take 1 tablet (1 mg total) by mouth daily.   gabapentin 100 MG capsule Commonly known as: NEURONTIN Take 2 capsules (200 mg total) by mouth at bedtime.   megestrol 40 MG tablet Commonly known as: MEGACE Take 3 tablets (120 mg total) by mouth daily for 14 days. Start taking on: March 22, 2019   multivitamin with minerals Tabs tablet Take 1 tablet by mouth daily.   thiamine 100 MG tablet Take 1 tablet (100 mg total) by mouth daily.   traMADol 50 MG tablet Commonly known as: ULTRAM Take 1 tablet (50 mg total) by mouth every 6 (six) hours as needed for up to 5 days for moderate pain.      Follow-up Information    Burnard Hawthorne, FNP Follow up.   Specialty: Family Medicine Contact information: [address] [place] [address] [place] [address] [phone]        [facility] [place] [facility]. Go in 7 day(s).   Specialty: Obstetrics and Gynecology Contact information: [address] [place]  [place] [phone]          Signed: Sol Blazing 03/21/2019, 9:11 AM

## 2019-03-21 NOTE — Consult Note (Addendum)
GYN Telephone Note I spoke to patient's nurse and patient had barely any spotting overnight and this morning. I asked her to let patient's primary team know patient is fine for d/c to home from GYN point of view. Pt already has f/u with me on 7/21 @ 10am in our [place] office. Please continue on megace until seen by me.   Durene Romans MD Attending [facility] [place] ([facility]ulty [facility]) GYN Consult Phone: [phone] (M-F, 0800-1700) & [phone] (Off hours, weekends, holidays)

## 2019-03-28 ENCOUNTER — Ambulatory Visit: Payer: Self-pay | Admitting: Obstetrics and Gynecology

## 2019-03-28 ENCOUNTER — Other Ambulatory Visit: Payer: Self-pay

## 2019-03-28 ENCOUNTER — Telehealth: Payer: Self-pay

## 2019-03-28 MED ORDER — MEGESTROL ACETATE 40 MG PO TABS: 120.0000 mg | ORAL_TABLET | Freq: Every day | ORAL | 1 refills | Status: AC

## 2019-03-28 NOTE — Telephone Encounter (Signed)
Patient presented to the office for appointment. Patient does not have insurance and was down for pap and endometrial biopsy. We have defer her appointment today. Information has been given to patient for [facility] program and referral has been made for patient. Per provider we will go ahead and call in more megace for patient to continued taking until she can get in. Patient should call go back to the ER if she start bleeding, soaking one pad per hour. Patient voice understanding.

## 2019-03-29 ENCOUNTER — Telehealth (HOSPITAL_COMMUNITY): Payer: Self-pay

## 2019-03-29 ENCOUNTER — Telehealth: Payer: Self-pay | Admitting: General Practice

## 2019-03-29 NOTE — Telephone Encounter (Signed)
Late entry--03/30/2019 Patient was given [facility] application.

## 2019-03-29 NOTE — Telephone Encounter (Signed)
Telephoned patient at home number. Left message to call and schedule appointment with [facility]. °

## 2019-03-31 ENCOUNTER — Other Ambulatory Visit (HOSPITAL_COMMUNITY): Payer: Self-pay | Admitting: *Deleted

## 2019-03-31 DIAGNOSIS — R921 Mammographic calcification found on diagnostic imaging of breast: Secondary | ICD-10-CM

## 2019-05-11 ENCOUNTER — Other Ambulatory Visit: Payer: Self-pay

## 2019-05-11 ENCOUNTER — Emergency Department (HOSPITAL_COMMUNITY): Payer: Self-pay

## 2019-05-11 ENCOUNTER — Inpatient Hospital Stay (HOSPITAL_COMMUNITY)
Admission: EM | Admit: 2019-05-11 | Discharge: 2019-05-14 | DRG: 872 | Disposition: A | Discharge status: Home / Self Care | Payer: Self-pay | Attending: Family Medicine | Admitting: Family Medicine

## 2019-05-11 ENCOUNTER — Ambulatory Visit (HOSPITAL_COMMUNITY): Payer: Self-pay

## 2019-05-11 DIAGNOSIS — Z8349 Family history of other endocrine, nutritional and metabolic diseases: Secondary | ICD-10-CM

## 2019-05-11 DIAGNOSIS — R740 Nonspecific elevation of levels of transaminase and lactic acid dehydrogenase [LDH]: Secondary | ICD-10-CM | POA: Diagnosis present

## 2019-05-11 DIAGNOSIS — Z79899 Other long term (current) drug therapy: Secondary | ICD-10-CM

## 2019-05-11 DIAGNOSIS — I10 Essential (primary) hypertension: Secondary | ICD-10-CM | POA: Diagnosis present

## 2019-05-11 DIAGNOSIS — Z833 Family history of diabetes mellitus: Secondary | ICD-10-CM

## 2019-05-11 DIAGNOSIS — R6510 Systemic inflammatory response syndrome (SIRS) of non-infectious origin without acute organ dysfunction: Secondary | ICD-10-CM | POA: Diagnosis present

## 2019-05-11 DIAGNOSIS — D500 Iron deficiency anemia secondary to blood loss (chronic): Secondary | ICD-10-CM | POA: Diagnosis present

## 2019-05-11 DIAGNOSIS — Z841 Family history of disorders of kidney and ureter: Secondary | ICD-10-CM

## 2019-05-11 DIAGNOSIS — Z881 Allergy status to other antibiotic agents status: Secondary | ICD-10-CM

## 2019-05-11 DIAGNOSIS — Z20828 Contact with and (suspected) exposure to other viral communicable diseases: Secondary | ICD-10-CM | POA: Diagnosis present

## 2019-05-11 DIAGNOSIS — F1090 Alcohol use, unspecified, uncomplicated: Secondary | ICD-10-CM | POA: Diagnosis present

## 2019-05-11 DIAGNOSIS — N852 Hypertrophy of uterus: Secondary | ICD-10-CM | POA: Diagnosis present

## 2019-05-11 DIAGNOSIS — N739 Female pelvic inflammatory disease, unspecified: Secondary | ICD-10-CM | POA: Diagnosis present

## 2019-05-11 DIAGNOSIS — R000 Tachycardia, unspecified: Secondary | ICD-10-CM | POA: Diagnosis present

## 2019-05-11 DIAGNOSIS — Z8249 Family history of ischemic heart disease and other diseases of the circulatory system: Secondary | ICD-10-CM

## 2019-05-11 DIAGNOSIS — N390 Urinary tract infection, site not specified: Secondary | ICD-10-CM | POA: Diagnosis present

## 2019-05-11 DIAGNOSIS — Z885 Allergy status to narcotic agent status: Secondary | ICD-10-CM

## 2019-05-11 DIAGNOSIS — F1010 Alcohol abuse, uncomplicated: Secondary | ICD-10-CM | POA: Diagnosis present

## 2019-05-11 DIAGNOSIS — F17210 Nicotine dependence, cigarettes, uncomplicated: Secondary | ICD-10-CM | POA: Diagnosis present

## 2019-05-11 DIAGNOSIS — N3001 Acute cystitis with hematuria: Secondary | ICD-10-CM

## 2019-05-11 DIAGNOSIS — Z823 Family history of stroke: Secondary | ICD-10-CM

## 2019-05-11 DIAGNOSIS — Z800 Family history of malignant neoplasm of digestive organs: Secondary | ICD-10-CM

## 2019-05-11 DIAGNOSIS — N920 Excessive and frequent menstruation with regular cycle: Secondary | ICD-10-CM | POA: Diagnosis present

## 2019-05-11 DIAGNOSIS — A419 Sepsis, unspecified organism: Principal | ICD-10-CM | POA: Diagnosis present

## 2019-05-11 DIAGNOSIS — K219 Gastro-esophageal reflux disease without esophagitis: Secondary | ICD-10-CM | POA: Diagnosis present

## 2019-05-11 LAB — CBC
HCT: 33.9 % — ABNORMAL LOW (ref 36.0–46.0)
Hemoglobin: 10.9 g/dL — ABNORMAL LOW (ref 12.0–15.0)
MCH: 29 pg (ref 26.0–34.0)
MCHC: 32.2 g/dL (ref 30.0–36.0)
MCV: 90.2 fL (ref 80.0–100.0)
Platelets: 253 10*3/uL (ref 150–400)
RBC: 3.76 MIL/uL — ABNORMAL LOW (ref 3.87–5.11)
RDW: 17.1 % — ABNORMAL HIGH (ref 11.5–15.5)
WBC: 19.9 10*3/uL — ABNORMAL HIGH (ref 4.0–10.5)
nRBC: 0 % (ref 0.0–0.2)

## 2019-05-11 LAB — COMPREHENSIVE METABOLIC PANEL
ALT: 86 U/L — ABNORMAL HIGH (ref 0–44)
AST: 65 U/L — ABNORMAL HIGH (ref 15–41)
Albumin: 4.2 g/dL (ref 3.5–5.0)
Alkaline Phosphatase: 74 U/L (ref 38–126)
Anion gap: 13 (ref 5–15)
BUN: 6 mg/dL (ref 6–20)
CO2: 22 mmol/L (ref 22–32)
Calcium: 9.4 mg/dL (ref 8.9–10.3)
Chloride: 99 mmol/L (ref 98–111)
Creatinine, Ser: 0.66 mg/dL (ref 0.44–1.00)
GFR calc Af Amer: >60 mL/min (ref >60)
GFR calc non Af Amer: >60 mL/min (ref >60)
Glucose, Bld: 115 mg/dL — ABNORMAL HIGH (ref 70–99)
Potassium: 3.1 mmol/L — ABNORMAL LOW (ref 3.5–5.1)
Sodium: 134 mmol/L — ABNORMAL LOW (ref 135–145)
Total Bilirubin: 0.6 mg/dL (ref 0.3–1.2)
Total Protein: 7.9 g/dL (ref 6.5–8.1)

## 2019-05-11 LAB — URINALYSIS, MICROSCOPIC (REFLEX)
RBC / HPF: >50 RBC/hpf (ref 0–5)
Squamous Epithelial / HPF: NONE SEEN (ref 0–5)

## 2019-05-11 LAB — URINALYSIS, ROUTINE W REFLEX MICROSCOPIC

## 2019-05-11 LAB — CBC WITH DIFFERENTIAL/PLATELET
Abs Immature Granulocytes: 0.15 10*3/uL — ABNORMAL HIGH (ref 0.00–0.07)
Basophils Absolute: 0.1 10*3/uL (ref 0.0–0.1)
Basophils Relative: 0 %
Eosinophils Absolute: 0 10*3/uL (ref 0.0–0.5)
Eosinophils Relative: 0 %
HCT: 36.2 % (ref 36.0–46.0)
Hemoglobin: 11.8 g/dL — ABNORMAL LOW (ref 12.0–15.0)
Immature Granulocytes: 1 %
Lymphocytes Relative: 4 %
Lymphs Abs: 0.7 10*3/uL (ref 0.7–4.0)
MCH: 29.3 pg (ref 26.0–34.0)
MCHC: 32.6 g/dL (ref 30.0–36.0)
MCV: 89.8 fL (ref 80.0–100.0)
Monocytes Absolute: 1.4 10*3/uL — ABNORMAL HIGH (ref 0.1–1.0)
Monocytes Relative: 7 %
Neutro Abs: 17.2 10*3/uL — ABNORMAL HIGH (ref 1.7–7.7)
Neutrophils Relative %: 88 %
Platelets: 255 10*3/uL (ref 150–400)
RBC: 4.03 MIL/uL (ref 3.87–5.11)
RDW: 16.9 % — ABNORMAL HIGH (ref 11.5–15.5)
WBC: 19.5 10*3/uL — ABNORMAL HIGH (ref 4.0–10.5)
nRBC: 0 % (ref 0.0–0.2)

## 2019-05-11 LAB — DIFFERENTIAL
Abs Immature Granulocytes: 0.24 10*3/uL — ABNORMAL HIGH (ref 0.00–0.07)
Basophils Absolute: 0.1 10*3/uL (ref 0.0–0.1)
Basophils Relative: 0 %
Eosinophils Absolute: 0 10*3/uL (ref 0.0–0.5)
Eosinophils Relative: 0 %
Immature Granulocytes: 1 %
Lymphocytes Relative: 3 %
Lymphs Abs: 0.7 10*3/uL (ref 0.7–4.0)
Monocytes Absolute: 1.2 10*3/uL — ABNORMAL HIGH (ref 0.1–1.0)
Monocytes Relative: 6 %
Neutro Abs: 17.7 10*3/uL — ABNORMAL HIGH (ref 1.7–7.7)
Neutrophils Relative %: 90 %

## 2019-05-11 LAB — TYPE AND SCREEN
ABO/RH(D): A POS
Antibody Screen: NEGATIVE

## 2019-05-11 LAB — LACTIC ACID, PLASMA: Lactic Acid, Venous: 1.8 mmol/L (ref 0.5–1.9)

## 2019-05-11 LAB — CK: Total CK: 55 U/L (ref 38–234)

## 2019-05-11 LAB — TSH: TSH: 1.645 u[IU]/mL (ref 0.350–4.500)

## 2019-05-11 LAB — HCG, QUANTITATIVE, PREGNANCY: hCG, Beta Chain, Quant, S: <1 m[IU]/mL (ref <5)

## 2019-05-11 LAB — SARS CORONAVIRUS 2 BY RT PCR (HOSPITAL ORDER, PERFORMED IN ~~LOC~~ HOSPITAL LAB): SARS Coronavirus 2: NEGATIVE

## 2019-05-11 IMAGING — CT CT ABD-PELV W/ CM
2 of 5 series · 11 of 46 positions shown, 12 images · IV contrast (OMNIPAQUE 300)
Comparison: CT abdomen pelvis [DATE]
COMPARISON: CT abdomen pelvis [DATE]

Addendum:
CLINICAL DATA: Acute generalized abdominal pain with fever,
dysmenorrhea

EXAM:
CT ABDOMEN AND PELVIS WITH CONTRAST
TECHNIQUE: Multidetector CT imaging of the abdomen and pelvis was performed
using the standard protocol following bolus administration of
intravenous contrast.
CONTRAST:  100mL OMNIPAQUE IOHEXOL 300 MG/ML  SOLN

[Series 2: axial st · axial · 0.71mm/px · z∈[+1157,+1547]mm · 8 of 92 slices shown, 9 images]
[im 7/92  soft-tissue]
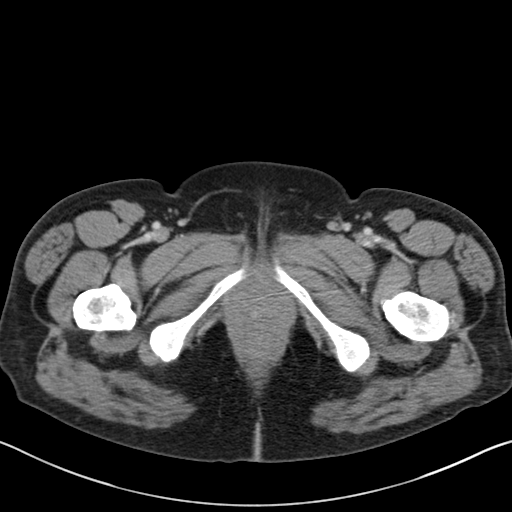
[im 7/92  bone]
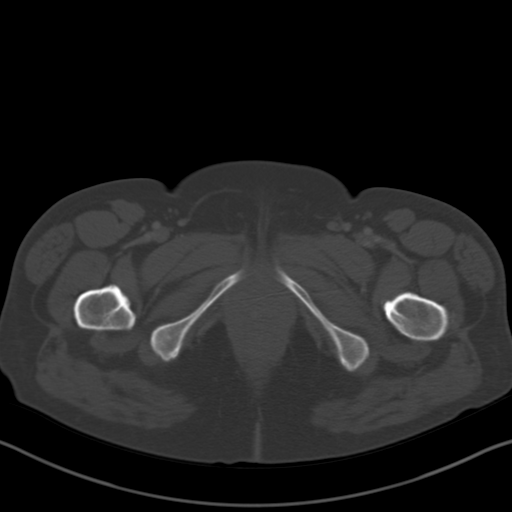
[im 19/92  soft-tissue]
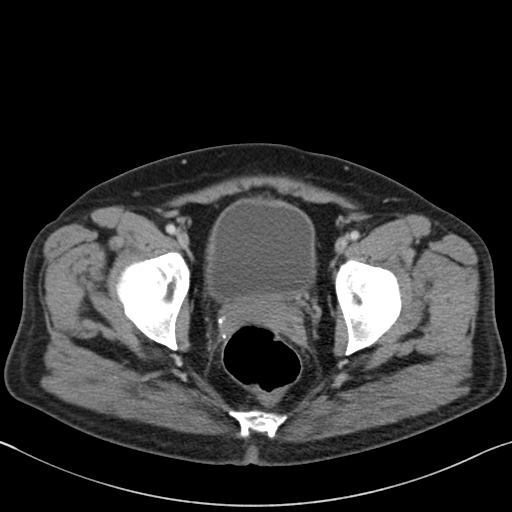
[im 31/92  soft-tissue]
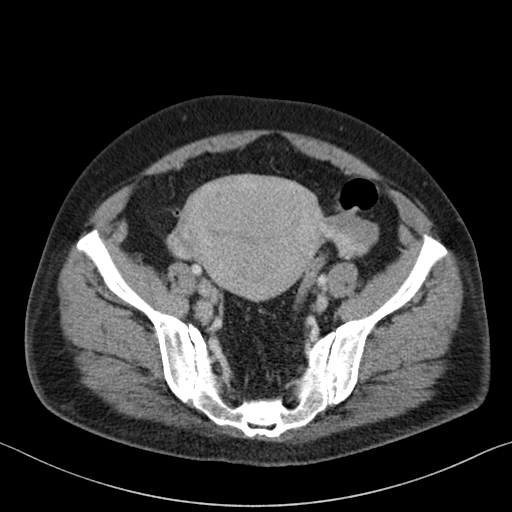
[im 43/92  soft-tissue]
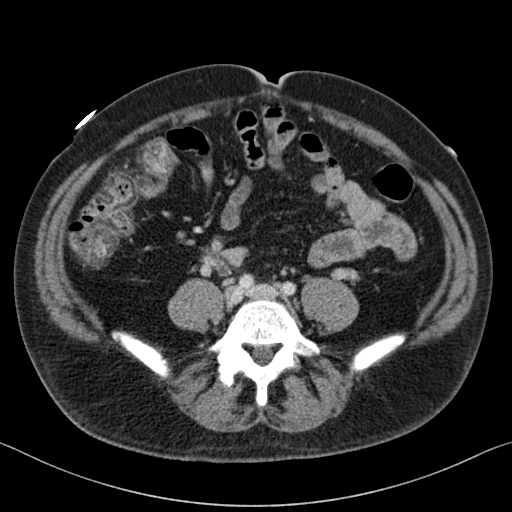
[im 49/92  soft-tissue]
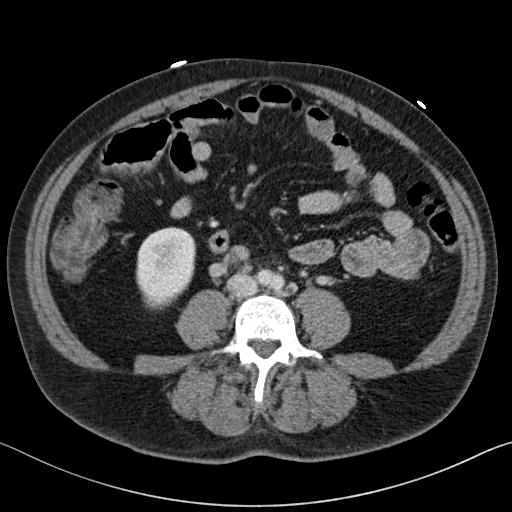
[im 61/92  soft-tissue]
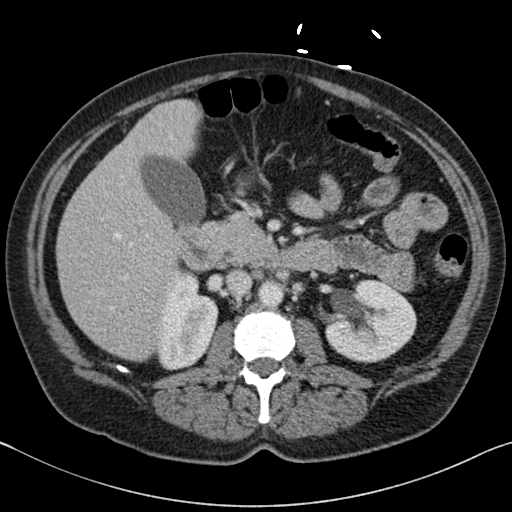
[im 73/92  soft-tissue]
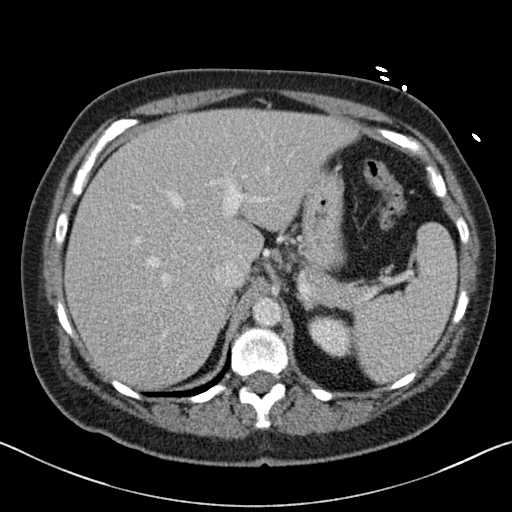
[im 85/92  soft-tissue]
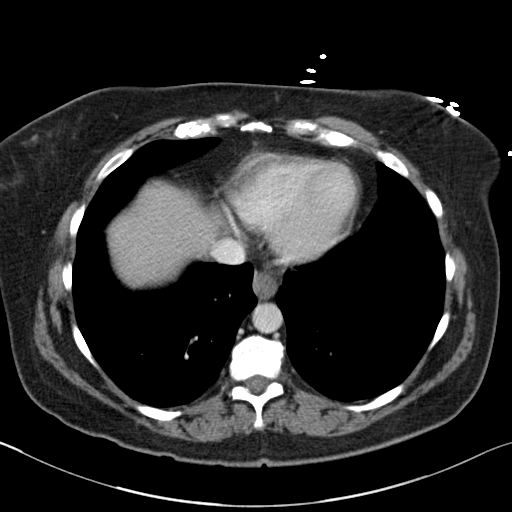

[Series 4: coronal st · coronal · 0.69mm/px · 3 of 144 slices shown]
[im 48/144  soft-tissue]
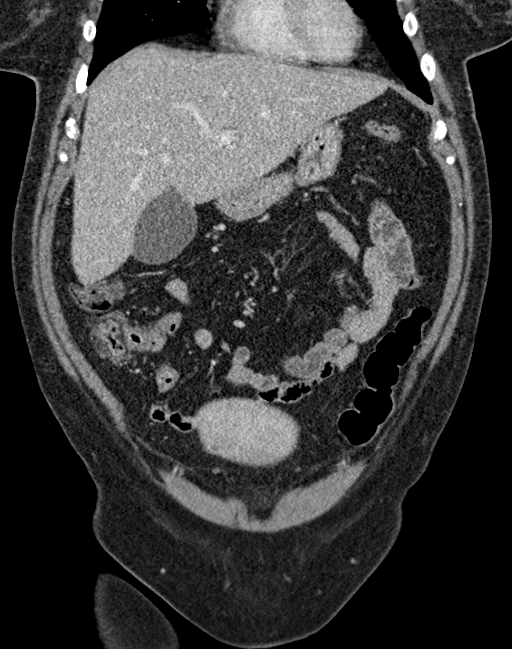
[im 64/144  soft-tissue]
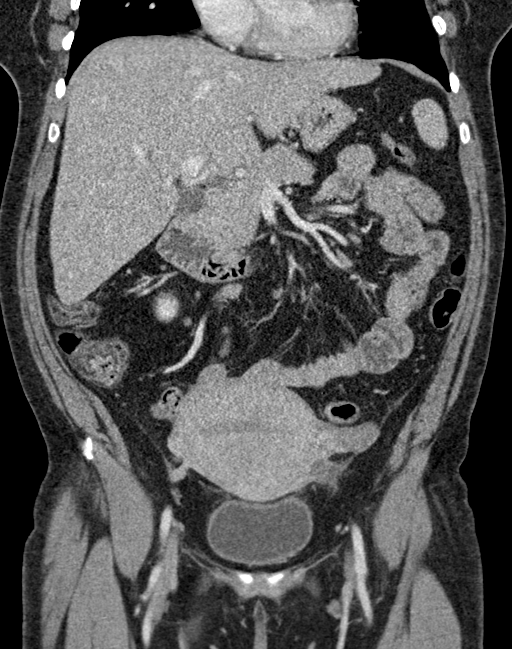
[im 80/144  soft-tissue]
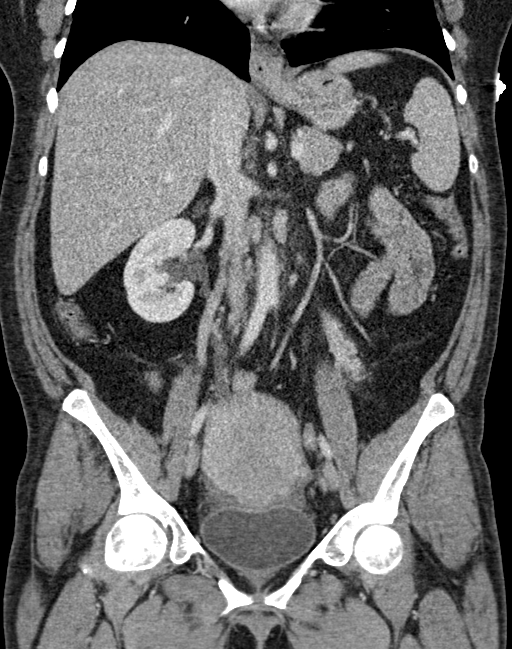

[11 of 46 positions shown; findings below may reference images not displayed]

FINDINGS: Lower chest: Lung bases are clear. Normal heart size. No pericardial
effusion.

Hepatobiliary: No focal liver abnormality is seen. No gallstones,
gallbladder wall thickening, or biliary dilatation.

Pancreas: Unremarkable. No pancreatic ductal dilatation or
surrounding inflammatory changes.

Spleen: Normal in size without focal abnormality.

Adrenals/Urinary Tract: Normal adrenal glands. No concerning renal
mass. There is mild prominence of the bilateral renal collecting
systems with slight urothelial thickening. A right extrarenal pelvis
is similar to prior. No obstructing urolithiasis. There is
circumferential bladder wall thickening with perivesicular haze.

Stomach/Bowel: Small sliding-type hiatal hernia. Duodenal sweep is
normal orientation. No small bowel dilatation or wall thickening. A
normal appendix is visualized. Mild intramural fat of the cecum and
ascending colon, nonspecific. No colonic dilatation or wall
thickening.

Vascular/Lymphatic: The aorta is normal caliber. There is clustered
retroperitoneal adenopathy with several borderline enlarged lymph
nodes present in the periaortic, aortocaval and inguinal lymphatic
changes. Example lymph nodes include a 17 mm lymph node in the left
iliac chain with some central hypoattenuation suggesting necrosis
(2/67) a similar-appearing right iliac node measures 11.6 cm (2/64).

Reproductive: There is marked enlargement of the uterus with
extensive myometrial thickening and scattered intramural punctate
hypoattenuating foci. The endometrial canal as a relatively bland
appearance. There is fluid in the endocervical canal and within the
vaginal vault. Small amount of fluid is present along the broad
ligament with venous engorgement of the parametrial vessels. Few
normal follicles are present in the right ovary.

Other: No abdominopelvic free fluid or free gas. No bowel containing
hernias.

Musculoskeletal: Multilevel degenerative changes are present in the
imaged portions of the spine.
IMPRESSION: 1. Marked enlargement of the uterus with extensive myometrial
thickening and scattered intramural punctate hypoattenuating foci.
Findings could reflect a benign process such as adenomyosis though
underlying malignancy is not fully excluded given concurrent
adenopathy as below. Recommend further evaluation with pelvic
ultrasound as clinically indicated.
2. Retroperitoneal and pelvic lymphadenopathy, concerning for
metastatic disease versus lymphoproliferative disorder.
3. Circumferential bladder wall thickening with perivesicular haze,
suggestive of cystitis. Mild prominence of the bilateral renal
collecting systems with slight urothelial thickening, which could
reflect ascending urinary tract infection. Correlate with
urinalysis.
4. Small amount of fluid along the broad ligament with venous
engorgement of the parametrial vessels, which can be seen with
pelvic congestion syndrome in the appropriate clinical setting.
5. Mild intramural fat of the cecum and ascending colon,
nonspecific, but can be seen in the setting of remote inflammatory
bowel disease or secondary to body habitus. Correlate with history.
6. Small sliding-type hiatal hernia.

ADDENDUM:
Incorrect measurement unit within the vascular/lymphatic section
below, should read as follows:

.Example lymph nodes include a 17 mm lymph node in the left iliac
chain with some central hypoattenuation suggesting necrosis (2/67).
A similar-appearing right iliac node measures 11.6 mm(2/64).

*** End of Addendum ***
FINDINGS: Lower chest: Lung bases are clear. Normal heart size. No pericardial
effusion.

Hepatobiliary: No focal liver abnormality is seen. No gallstones,
gallbladder wall thickening, or biliary dilatation.

Pancreas: Unremarkable. No pancreatic ductal dilatation or
surrounding inflammatory changes.

Spleen: Normal in size without focal abnormality.

Adrenals/Urinary Tract: Normal adrenal glands. No concerning renal
mass. There is mild prominence of the bilateral renal collecting
systems with slight urothelial thickening. A right extrarenal pelvis
is similar to prior. No obstructing urolithiasis. There is
circumferential bladder wall thickening with perivesicular haze.

Stomach/Bowel: Small sliding-type hiatal hernia. Duodenal sweep is
normal orientation. No small bowel dilatation or wall thickening. A
normal appendix is visualized. Mild intramural fat of the cecum and
ascending colon, nonspecific. No colonic dilatation or wall
thickening.

Vascular/Lymphatic: The aorta is normal caliber. There is clustered
retroperitoneal adenopathy with several borderline enlarged lymph
nodes present in the periaortic, aortocaval and inguinal lymphatic
changes. Example lymph nodes include a 17 mm lymph node in the left
iliac chain with some central hypoattenuation suggesting necrosis
(2/67) a similar-appearing right iliac node measures 11.6 cm (2/64).

Reproductive: There is marked enlargement of the uterus with
extensive myometrial thickening and scattered intramural punctate
hypoattenuating foci. The endometrial canal as a relatively bland
appearance. There is fluid in the endocervical canal and within the
vaginal vault. Small amount of fluid is present along the broad
ligament with venous engorgement of the parametrial vessels. Few
normal follicles are present in the right ovary.

Other: No abdominopelvic free fluid or free gas. No bowel containing
hernias.

Musculoskeletal: Multilevel degenerative changes are present in the
imaged portions of the spine.
IMPRESSION: 1. Marked enlargement of the uterus with extensive myometrial
thickening and scattered intramural punctate hypoattenuating foci.
Findings could reflect a benign process such as adenomyosis though
underlying malignancy is not fully excluded given concurrent
adenopathy as below. Recommend further evaluation with pelvic
ultrasound as clinically indicated.
2. Retroperitoneal and pelvic lymphadenopathy, concerning for
metastatic disease versus lymphoproliferative disorder.
3. Circumferential bladder wall thickening with perivesicular haze,
suggestive of cystitis. Mild prominence of the bilateral renal
collecting systems with slight urothelial thickening, which could
reflect ascending urinary tract infection. Correlate with
urinalysis.
4. Small amount of fluid along the broad ligament with venous
engorgement of the parametrial vessels, which can be seen with
pelvic congestion syndrome in the appropriate clinical setting.
5. Mild intramural fat of the cecum and ascending colon,
nonspecific, but can be seen in the setting of remote inflammatory
bowel disease or secondary to body habitus. Correlate with history.
6. Small sliding-type hiatal hernia.

## 2019-05-11 IMAGING — DX DG CHEST 1V PORT
1 series · 1 of 1 positions shown · non-contrast
Comparison: [DATE]

CLINICAL DATA: Fever and body aches.

EXAM:
PORTABLE CHEST 1 VIEW

[chest ap]
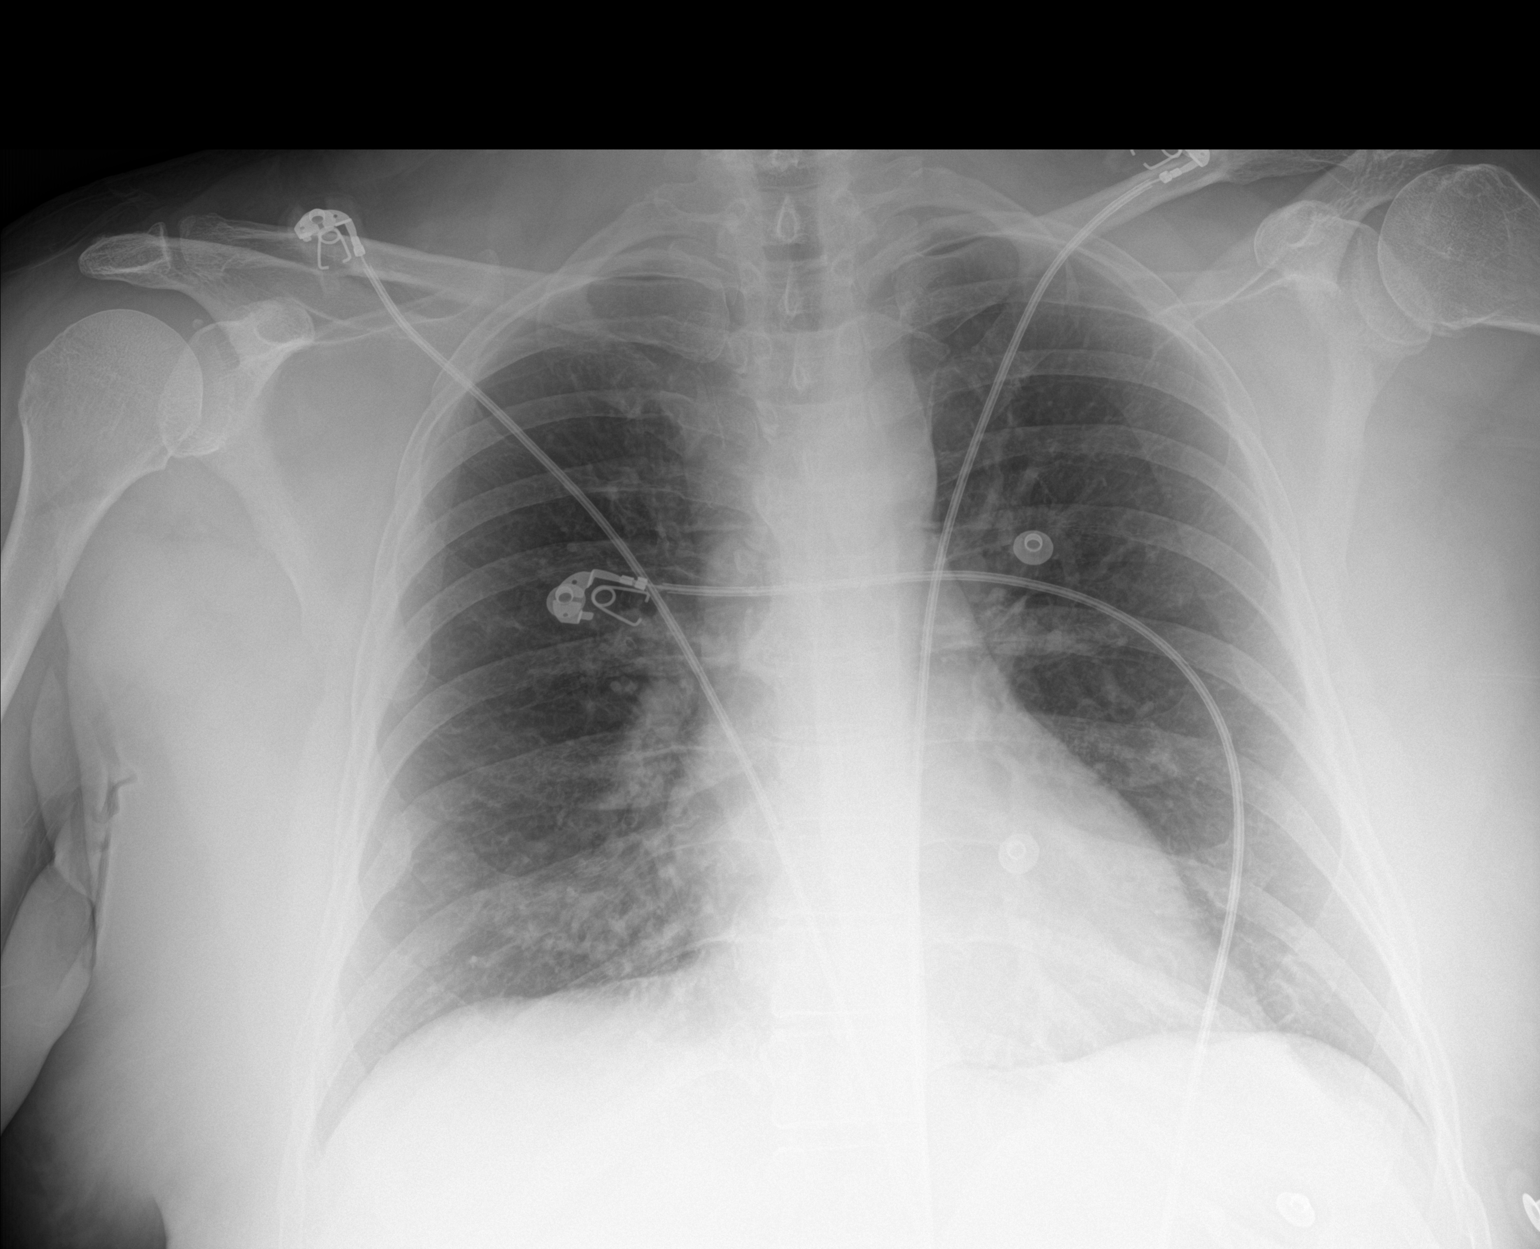

[1 of 1 positions shown; findings below may reference images not displayed]

FINDINGS: The cardiomediastinal contours are normal. The lungs are clear.
Pulmonary vasculature is normal. No consolidation, pleural effusion,
or pneumothorax. No acute osseous abnormalities are seen.
IMPRESSION: No active disease.

## 2019-05-11 MED ORDER — FENTANYL CITRATE (PF) 100 MCG/2ML IJ SOLN
50.0000 ug | Freq: Once | INTRAMUSCULAR | Status: AC
  Administered 2019-05-11: 50 ug via INTRAVENOUS
  Filled 2019-05-11: qty 2

## 2019-05-11 MED ORDER — SODIUM CHLORIDE (PF) 0.9 % IJ SOLN
INTRAMUSCULAR | Status: AC
  Filled 2019-05-11: qty 50

## 2019-05-11 MED ORDER — SODIUM CHLORIDE 0.9 % IV BOLUS
1000.0000 mL | Freq: Once | INTRAVENOUS | Status: AC
  Administered 2019-05-11: 1000 mL via INTRAVENOUS

## 2019-05-11 MED ORDER — IOHEXOL 300 MG/ML  SOLN
100.0000 mL | Freq: Once | INTRAMUSCULAR | Status: AC | PRN
  Administered 2019-05-11: 100 mL via INTRAVENOUS

## 2019-05-11 MED ORDER — ONDANSETRON HCL 4 MG/2ML IJ SOLN
4.0000 mg | Freq: Once | INTRAMUSCULAR | Status: AC
  Administered 2019-05-11: 4 mg via INTRAVENOUS
  Filled 2019-05-11: qty 2

## 2019-05-11 MED ORDER — MORPHINE SULFATE (PF) 4 MG/ML IV SOLN
4.0000 mg | Freq: Once | INTRAVENOUS | Status: DC
  Filled 2019-05-11: qty 1

## 2019-05-11 MED ORDER — ACETAMINOPHEN 500 MG PO TABS
1000.0000 mg | ORAL_TABLET | Freq: Once | ORAL | Status: AC
  Administered 2019-05-11: 1000 mg via ORAL
  Filled 2019-05-11: qty 2

## 2019-05-11 NOTE — ED Provider Notes (Signed)
Plan at signout f/u on CT imaging/COVID testing If workup negative she can be discharged   Ripley Fraise, MD 05/11/19 2314

## 2019-05-11 NOTE — ED Provider Notes (Signed)
[facility] DEPT Provider Note   CSN: [id number] Arrival date & time: 05/11/19  1841     History   Chief Complaint Chief Complaint  Patient presents with  . Vaginal Bleeding    HPI AZAELIA KINCANNON is a 48 y.o. female.     Patient is a 48 year old female with a history of hypertension, alcohol abuse, recurrent heavy menstrual periods resulting in anemia and blood transfusions who is presenting today with multiple symptoms.  Patient states that she received Megace and blood transfusion in July after heavy vaginal bleeding.  The menses  Subsided for approximately 1 week and she started bleeding again on 24 July.  She did follow-up with OB/GYN but has not been able to have an appointment yet because of insurance reasons.  She has an appointment in 2 weeks for further testing.  However she was given a second prescription for Megace but states it has not stopped her vaginal bleeding.  She typically is going through 1 pad every 2-3 hours.  She states that not quite as heavy as it was before but anytime she standing or walking it is made worse.  She has had no recent sexual contacts in the last few months due to all the bleeding.  In the last 1 week she is seemed to feel worse.  She is now having racing heart, generalized weakness, myalgias, lower abdominal pain and intermittent nausea.  She denies any diarrhea or vomiting.  Patient states that she is still drinking about the typical amount.  She has had no alcohol today but states she is never had withdrawal before and did drink yesterday.  She denies any urinary symptoms.  The history is provided by the patient.  Vaginal Bleeding   Past Medical History:  Diagnosis Date  . Anemia   . ETOH abuse   . Fibroid   . GERD (gastroesophageal reflux disease)   . Heavy menstrual period   . Hypertension     Patient Active Problem List   Diagnosis Date Noted  . Abnormal uterine bleeding (AUB) 03/20/2019  .  Dysmenorrhea 03/20/2019  . Hypomagnesemia   . Laceration of scalp 04/20/2018  . Acute respiratory failure with hypoxia ([place]) 04/10/2018  . Nonallopathic lesion of sacral region 03/28/2018  . Nonallopathic lesion of thoracic region 03/28/2018  . Nonallopathic lesion of lumbosacral region 03/28/2018  . Nonallopathic lesion of pelvic region 03/28/2018  . Nonallopathic lesion of cervical region 03/28/2018  . Enlarged thyroid 03/07/2018  . 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 Lumbar radiculopathy, right 02/08/2018  . GAD (generalized anxiety disorder) 01/03/2018  . Rash 01/03/2018  . Right hip pain 01/03/2018  . GERD (gastroesophageal reflux disease) 12/13/2017  . Menorrhagia 12/13/2017  . Hypokalemia 12/13/2017  . Fibroid uterus 12/13/2017  . Elevated liver enzymes 11/24/2013  . Depression 11/24/2013  . Fatigue 07/12/2013  . Abnormal laboratory test 07/12/2013  . Symptomatic anemia 07/12/2013  . HTN (hypertension) 07/12/2013  . Alcohol abuse 07/12/2013  . Tobacco abuse 07/12/2013  . Yeast infection of the skin 07/12/2013  . Routine general medical examination at a health care facility 07/12/2013    Past Surgical History:  Procedure Laterality Date  . ELBOW SURGERY     1997   . TUBAL LIGATION       OB History    Gravida  2   Para  2   Term  2   Preterm      AB      Living  2     SAB      TAB      Ectopic      Multiple      Live Births  2        Obstetric Comments  Vaginal x 2         Home Medications    Prior to Admission medications   Medication Sig Start Date End Date Taking? Authorizing Provider  amLODipine (NORVASC) 10 MG tablet Take 1 tablet (10 mg total) by mouth daily. 04/20/18  Yes Arnett, Yvetta Coder, FNP  Aspirin-Salicylamide-Caffeine (BC HEADACHE PO) Take 1 packet by mouth daily as needed (pain and headache).   Yes [provider]  ferrous sulfate 325 (65 FE) MG tablet TAKE 1 TABLET (325 MG TOTAL) BY MOUTH 2 (TWO)  TIMES DAILY WITH A MEAL. 10/24/18  Yes Burnard Hawthorne, FNP  Multiple Vitamins-Minerals (MULTIVITAMIN ADULT) TABS Take 1 tablet by mouth daily.   Yes [provider]  acetaminophen (TYLENOL) 325 MG tablet Take 2 tablets (650 mg total) by mouth every 6 (six) hours as needed for mild pain (or Fever >/= 101). Patient not taking: Reported on 06/09/2018 04/13/18   Nicholes Mango, MD  busPIRone (BUSPAR) 10 MG tablet TAKE 1 TABLET BY MOUTH THREE TIMES A DAY Patient not taking: No sig reported 04/28/18   Burnard Hawthorne, FNP  cyanocobalamin ([facility] VITAMIN B12) 1000 MCG tablet Take 1 tablet (1,000 mcg total) by mouth daily. Patient not taking: Reported on 03/18/2019 07/29/18   Burnard Hawthorne, FNP  folic acid (FOLVITE) 1 MG tablet Take 1 tablet (1 mg total) by mouth daily. Patient not taking: Reported on 05/11/2019 04/13/18   Nicholes Mango, MD  gabapentin (NEURONTIN) 100 MG capsule Take 2 capsules (200 mg total) by mouth at bedtime. Patient not taking: Reported on 06/09/2018 02/08/18   Lyndal Pulley, DO  Multiple Vitamin (MULTIVITAMIN WITH MINERALS) TABS tablet Take 1 tablet by mouth daily. Patient not taking: Reported on 03/28/2019 04/13/18   Nicholes Mango, MD  thiamine 100 MG tablet Take 1 tablet (100 mg total) by mouth daily. Patient not taking: Reported on 06/09/2018 04/13/18   Nicholes Mango, MD    Family History Family History  Problem Relation Age of Onset  . Cancer Mother 74       Lung   . Hyperlipidemia Mother   . Hypertension Mother   . Stroke Mother   . Kidney disease Mother   . Diabetes Mother   . Heart disease Mother   . Hyperlipidemia Father   . Autism Son   . Cancer Paternal Grandfather 73       Colon Cancer - died  . Breast cancer Neg Hx     Social History Social History   Tobacco Use  . Smoking status: Light Tobacco Smoker    Types: Cigarettes  . Smokeless tobacco: Current User  Substance Use Topics  . Alcohol use: Yes  . Drug use: No     Allergies   Amoxicillin  and Morphine and related   Review of Systems Review of Systems  Genitourinary: Positive for vaginal bleeding.  Neurological: Positive for headaches.  All other systems reviewed and are negative.    Physical Exam Updated Vital Signs BP 135/82   Pulse (!) 127   Temp (!) 102.8 F (39.3 C) (Oral) Comment (Src): Dr. Maryan Rued aware  Resp (!) 23   SpO2 97%   Physical Exam Vitals signs and nursing note reviewed.  Constitutional:  General: She is not in acute distress.    Appearance: She is well-developed and normal weight.  HENT:     Head: Normocephalic and atraumatic.     Nose: Nose normal.  Eyes:     Pupils: Pupils are equal, round, and reactive to light.  Cardiovascular:     Rate and Rhythm: Regular rhythm. Tachycardia present.     Heart sounds: Normal heart sounds. No murmur. No friction rub.  Pulmonary:     Effort: Pulmonary effort is normal.     Breath sounds: Normal breath sounds. No wheezing or rales.  Abdominal:     General: Bowel sounds are normal. There is no distension.     Palpations: Abdomen is soft.     Tenderness: There is abdominal tenderness in the right lower quadrant, suprapubic area and left lower quadrant. There is no guarding or rebound.  Genitourinary:    Labia:        Right: No rash.        Left: No rash.      Cervix: Cervical bleeding present. No cervical motion tenderness.     Uterus: Tender.      Adnexa:        Right: Tenderness present.        Left: Tenderness present.      Comments: Mild diffuse pelvic tenderness Musculoskeletal: Normal range of motion.        General: No tenderness.     Right lower leg: No edema.     Left lower leg: No edema.     Comments: No edema  Skin:    General: Skin is warm and dry.     Findings: No rash.  Neurological:     General: No focal deficit present.     Mental Status: She is alert and oriented to person, place, and time. Mental status is at baseline.     Cranial Nerves: No cranial nerve deficit.   Psychiatric:        Mood and Affect: Mood normal.        Behavior: Behavior normal.        Thought Content: Thought content normal.      ED Treatments / Results  Labs (all labs ordered are listed, but only abnormal results are displayed) Labs Reviewed  COMPREHENSIVE METABOLIC PANEL - Abnormal; Notable for the following components:      Result Value   Sodium 134 (*)    Potassium 3.1 (*)    Glucose, Bld 115 (*)    AST 65 (*)    ALT 86 (*)    All other components within normal limits  CBC WITH DIFFERENTIAL/PLATELET - Abnormal; Notable for the following components:   WBC 19.5 (*)    Hemoglobin 11.8 (*)    RDW 16.9 (*)    Neutro Abs 17.2 (*)    Monocytes Absolute 1.4 (*)    Abs Immature Granulocytes 0.15 (*)    All other components within normal limits  URINALYSIS, ROUTINE W REFLEX MICROSCOPIC - Abnormal; Notable for the following components:   Color, Urine RED (*)    APPearance TURBID (*)    Glucose, UA   (*)    Value: TEST NOT REPORTED DUE TO COLOR INTERFERENCE OF URINE PIGMENT   Hgb urine dipstick   (*)    Value: TEST NOT REPORTED DUE TO COLOR INTERFERENCE OF URINE PIGMENT   Bilirubin Urine   (*)    Value: TEST NOT REPORTED DUE TO COLOR INTERFERENCE OF URINE PIGMENT   Ketones,  ur   (*)    Value: TEST NOT REPORTED DUE TO COLOR INTERFERENCE OF URINE PIGMENT   Protein, ur   (*)    Value: TEST NOT REPORTED DUE TO COLOR INTERFERENCE OF URINE PIGMENT   Nitrite   (*)    Value: TEST NOT REPORTED DUE TO COLOR INTERFERENCE OF URINE PIGMENT   Leukocytes,Ua   (*)    Value: TEST NOT REPORTED DUE TO COLOR INTERFERENCE OF URINE PIGMENT   All other components within normal limits  CBC - Abnormal; Notable for the following components:   WBC 19.9 (*)    RBC 3.76 (*)    Hemoglobin 10.9 (*)    HCT 33.9 (*)    RDW 17.1 (*)    All other components within normal limits  DIFFERENTIAL - Abnormal; Notable for the following components:   Neutro Abs 17.7 (*)    Monocytes Absolute 1.2 (*)     Abs Immature Granulocytes 0.24 (*)    All other components within normal limits  URINALYSIS, MICROSCOPIC (REFLEX) - Abnormal; Notable for the following components:   Bacteria, UA RARE (*)    All other components within normal limits  SARS CORONAVIRUS 2 (HOSPITAL ORDER, [place] [facility])  LACTIC ACID, PLASMA  CBC WITH DIFFERENTIAL/PLATELET  CK  TSH  TYPE AND SCREEN    EKG None  Radiology No results found.  Procedures Procedures (including critical care time)  Medications Ordered in ED Medications  sodium chloride 0.9 % bolus 1,000 mL (0 mLs Intravenous Stopped 05/11/19 2015)  ondansetron (ZOFRAN) injection 4 mg (4 mg Intravenous Given 05/11/19 2005)  fentaNYL (SUBLIMAZE) injection 50 mcg (50 mcg Intravenous Given 05/11/19 2011)     Initial Impression / Assessment and Plan / ED Course  I have reviewed the triage vital signs and the nursing notes.  Pertinent labs & imaging results that were available during my care of the patient were reviewed by me and considered in my medical decision making (see chart for details).        Patient is a 48 year old female presenting today with fever of 102.8, persistent tachycardia between 1 20-1 50, generalized weakness, body aches, vaginal bleeding and lower abdominal pain.  Patient denies any cough or shortness of breath.  On pelvic exam she does have tenderness bilaterally but no cervical motion tenderness concerning for PID.  She also has suprapubic abdominal tenderness.  Low suspicion for STI as patient has not been sexually active for months due to all the vaginal bleeding.  Patient today has a white count of 19,000 but stable hemoglobin of 10.9.  Lactic acid is within normal limits.  Patient's urine due to significant blood showed only rare bacteria and greater than 50 red blood cells.  CMP with mild hypokalemia 3.1 and mild elevated LFTs of 65 and 86.  Patient given IV fluids and Tylenol for the fever.  TSH, CK and COVID  testing is pending.  CT of the abdomen pelvis for further evaluation also pending  Final Clinical Impressions(s) / ED Diagnoses   Final diagnoses:  None    ED Discharge Orders    None       Blanchie Dessert, MD 05/15/19 1012

## 2019-05-11 NOTE — ED Notes (Signed)
Pt c/o cramping. HR 140's with EMS.

## 2019-05-11 NOTE — ED Triage Notes (Signed)
Per EMS: Pt hx of heavy menstruation.  Pt has has constant heavy bleeding since April.  Pt has had 3 blood transfusions.  Pt last in hospital in July.  hgb was about 6 then.  Pt was given megace, which only helped for a week.

## 2019-05-11 NOTE — ED Notes (Signed)
Pt able to ambulate to the restroom with standby assist. No issues observed. Gait steady.

## 2019-05-12 ENCOUNTER — Encounter (HOSPITAL_COMMUNITY): Payer: Self-pay | Admitting: Internal Medicine

## 2019-05-12 DIAGNOSIS — N390 Urinary tract infection, site not specified: Secondary | ICD-10-CM | POA: Diagnosis present

## 2019-05-12 DIAGNOSIS — R6510 Systemic inflammatory response syndrome (SIRS) of non-infectious origin without acute organ dysfunction: Secondary | ICD-10-CM | POA: Diagnosis present

## 2019-05-12 DIAGNOSIS — N3001 Acute cystitis with hematuria: Secondary | ICD-10-CM | POA: Insufficient documentation

## 2019-05-12 HISTORY — DX: Urinary tract infection, site not specified: N39.0

## 2019-05-12 HISTORY — DX: Systemic inflammatory response syndrome (sirs) of non-infectious origin without acute organ dysfunction: R65.10

## 2019-05-12 LAB — CBC WITH DIFFERENTIAL/PLATELET
Abs Immature Granulocytes: 0.1 10*3/uL — ABNORMAL HIGH (ref 0.00–0.07)
Basophils Absolute: 0 10*3/uL (ref 0.0–0.1)
Basophils Relative: 0 %
Eosinophils Absolute: 0 10*3/uL (ref 0.0–0.5)
Eosinophils Relative: 0 %
HCT: 34.2 % — ABNORMAL LOW (ref 36.0–46.0)
Hemoglobin: 11.1 g/dL — ABNORMAL LOW (ref 12.0–15.0)
Immature Granulocytes: 1 %
Lymphocytes Relative: 6 %
Lymphs Abs: 0.8 10*3/uL (ref 0.7–4.0)
MCH: 29.6 pg (ref 26.0–34.0)
MCHC: 32.5 g/dL (ref 30.0–36.0)
MCV: 91.2 fL (ref 80.0–100.0)
Monocytes Absolute: 0.7 10*3/uL (ref 0.1–1.0)
Monocytes Relative: 5 %
Neutro Abs: 12.7 10*3/uL — ABNORMAL HIGH (ref 1.7–7.7)
Neutrophils Relative %: 88 %
Platelets: 236 10*3/uL (ref 150–400)
RBC: 3.75 MIL/uL — ABNORMAL LOW (ref 3.87–5.11)
RDW: 17 % — ABNORMAL HIGH (ref 11.5–15.5)
WBC: 14.3 10*3/uL — ABNORMAL HIGH (ref 4.0–10.5)
nRBC: 0 % (ref 0.0–0.2)

## 2019-05-12 LAB — BASIC METABOLIC PANEL
Anion gap: 15 (ref 5–15)
BUN: 5 mg/dL — ABNORMAL LOW (ref 6–20)
CO2: 23 mmol/L (ref 22–32)
Calcium: 8.9 mg/dL (ref 8.9–10.3)
Chloride: 98 mmol/L (ref 98–111)
Creatinine, Ser: 0.75 mg/dL (ref 0.44–1.00)
GFR calc Af Amer: >60 mL/min (ref >60)
GFR calc non Af Amer: >60 mL/min (ref >60)
Glucose, Bld: 105 mg/dL — ABNORMAL HIGH (ref 70–99)
Potassium: 3.4 mmol/L — ABNORMAL LOW (ref 3.5–5.1)
Sodium: 136 mmol/L (ref 135–145)

## 2019-05-12 LAB — LACTIC ACID, PLASMA: Lactic Acid, Venous: 2.4 mmol/L (ref 0.5–1.9)

## 2019-05-12 LAB — HEPATIC FUNCTION PANEL
ALT: 70 U/L — ABNORMAL HIGH (ref 0–44)
AST: 49 U/L — ABNORMAL HIGH (ref 15–41)
Albumin: 3.7 g/dL (ref 3.5–5.0)
Alkaline Phosphatase: 63 U/L (ref 38–126)
Bilirubin, Direct: 0.2 mg/dL (ref 0.0–0.2)
Indirect Bilirubin: 0.4 mg/dL (ref 0.3–0.9)
Total Bilirubin: 0.6 mg/dL (ref 0.3–1.2)
Total Protein: 7.2 g/dL (ref 6.5–8.1)

## 2019-05-12 LAB — PROCALCITONIN: Procalcitonin: 0.49 ng/mL

## 2019-05-12 MED ORDER — SODIUM CHLORIDE 0.9 % IV BOLUS
500.0000 mL | Freq: Once | INTRAVENOUS | Status: AC
  Administered 2019-05-12: 500 mL via INTRAVENOUS

## 2019-05-12 MED ORDER — AMLODIPINE BESYLATE 10 MG PO TABS
10.0000 mg | ORAL_TABLET | Freq: Every day | ORAL | Status: DC
  Administered 2019-05-12 – 2019-05-14 (×3): 10 mg via ORAL
  Filled 2019-05-12: qty 1
  Filled 2019-05-12: qty 2
  Filled 2019-05-12: qty 1

## 2019-05-12 MED ORDER — SODIUM CHLORIDE 0.9 % IV SOLN
100.0000 mg | Freq: Two times a day (BID) | INTRAVENOUS | Status: DC
  Administered 2019-05-12 – 2019-05-14 (×5): 100 mg via INTRAVENOUS
  Filled 2019-05-12 (×8): qty 100

## 2019-05-12 MED ORDER — KETOROLAC TROMETHAMINE 30 MG/ML IJ SOLN
30.0000 mg | Freq: Once | INTRAMUSCULAR | Status: AC
  Administered 2019-05-12: 30 mg via INTRAVENOUS
  Filled 2019-05-12: qty 1

## 2019-05-12 MED ORDER — METRONIDAZOLE IN NACL 5-0.79 MG/ML-% IV SOLN
500.0000 mg | Freq: Three times a day (TID) | INTRAVENOUS | Status: DC
  Administered 2019-05-12 – 2019-05-14 (×6): 500 mg via INTRAVENOUS
  Filled 2019-05-12 (×6): qty 100

## 2019-05-12 MED ORDER — FENTANYL CITRATE (PF) 100 MCG/2ML IJ SOLN
25.0000 ug | INTRAMUSCULAR | Status: DC | PRN
  Administered 2019-05-12 – 2019-05-14 (×13): 25 ug via INTRAVENOUS
  Filled 2019-05-12 (×13): qty 2

## 2019-05-12 MED ORDER — FENTANYL CITRATE (PF) 100 MCG/2ML IJ SOLN
50.0000 ug | Freq: Once | INTRAMUSCULAR | Status: AC
  Administered 2019-05-12: 50 ug via INTRAVENOUS
  Filled 2019-05-12: qty 2

## 2019-05-12 MED ORDER — SODIUM CHLORIDE 0.9 % IV SOLN
1.0000 g | Freq: Once | INTRAVENOUS | Status: AC
  Administered 2019-05-12: 1 g via INTRAVENOUS
  Filled 2019-05-12: qty 10

## 2019-05-12 MED ORDER — POTASSIUM CHLORIDE CRYS ER 20 MEQ PO TBCR
40.0000 meq | EXTENDED_RELEASE_TABLET | Freq: Once | ORAL | Status: AC
  Administered 2019-05-12: 40 meq via ORAL
  Filled 2019-05-12: qty 2

## 2019-05-12 MED ORDER — ACETAMINOPHEN 325 MG PO TABS
650.0000 mg | ORAL_TABLET | Freq: Four times a day (QID) | ORAL | Status: DC | PRN
  Administered 2019-05-12 – 2019-05-13 (×4): 650 mg via ORAL
  Filled 2019-05-12 (×4): qty 2

## 2019-05-12 MED ORDER — THIAMINE HCL 100 MG/ML IJ SOLN: 100.0000 mg | Freq: Every day | INTRAMUSCULAR | Status: DC

## 2019-05-12 MED ORDER — FOLIC ACID 1 MG PO TABS
1.0000 mg | ORAL_TABLET | Freq: Every day | ORAL | Status: DC
  Administered 2019-05-12 – 2019-05-14 (×3): 1 mg via ORAL
  Filled 2019-05-12 (×3): qty 1

## 2019-05-12 MED ORDER — ENOXAPARIN SODIUM 40 MG/0.4ML ~~LOC~~ SOLN
40.0000 mg | SUBCUTANEOUS | Status: DC
  Administered 2019-05-12 – 2019-05-13 (×2): 40 mg via SUBCUTANEOUS
  Filled 2019-05-12 (×2): qty 0.4

## 2019-05-12 MED ORDER — LORAZEPAM 2 MG/ML IJ SOLN: 1.0000 mg | Freq: Four times a day (QID) | INTRAMUSCULAR | Status: DC | PRN

## 2019-05-12 MED ORDER — MEGESTROL ACETATE 40 MG PO TABS
40.0000 mg | ORAL_TABLET | Freq: Two times a day (BID) | ORAL | Status: DC
  Administered 2019-05-12 – 2019-05-14 (×5): 40 mg via ORAL
  Filled 2019-05-12 (×5): qty 1

## 2019-05-12 MED ORDER — FERROUS SULFATE 325 (65 FE) MG PO TABS
325.0000 mg | ORAL_TABLET | Freq: Two times a day (BID) | ORAL | Status: DC
  Administered 2019-05-12 – 2019-05-14 (×5): 325 mg via ORAL
  Filled 2019-05-12 (×7): qty 1

## 2019-05-12 MED ORDER — SODIUM CHLORIDE 0.9 % IV SOLN
2.0000 g | INTRAVENOUS | Status: DC
  Administered 2019-05-12 – 2019-05-13 (×2): 2 g via INTRAVENOUS
  Filled 2019-05-12: qty 20
  Filled 2019-05-12 (×2): qty 2

## 2019-05-12 MED ORDER — ADULT MULTIVITAMIN W/MINERALS CH
1.0000 | ORAL_TABLET | Freq: Every day | ORAL | Status: DC
  Administered 2019-05-12 – 2019-05-14 (×3): 1 via ORAL
  Filled 2019-05-12 (×3): qty 1

## 2019-05-12 MED ORDER — ONDANSETRON HCL 4 MG PO TABS: 4.0000 mg | ORAL_TABLET | Freq: Four times a day (QID) | ORAL | Status: DC | PRN

## 2019-05-12 MED ORDER — VITAMIN B-1 100 MG PO TABS
100.0000 mg | ORAL_TABLET | Freq: Every day | ORAL | Status: DC
  Administered 2019-05-12 – 2019-05-14 (×3): 100 mg via ORAL
  Filled 2019-05-12 (×3): qty 1

## 2019-05-12 MED ORDER — ACETAMINOPHEN 500 MG PO TABS
1000.0000 mg | ORAL_TABLET | Freq: Once | ORAL | Status: AC
  Administered 2019-05-12: 1000 mg via ORAL
  Filled 2019-05-12: qty 2

## 2019-05-12 MED ORDER — LORAZEPAM 1 MG PO TABS
1.0000 mg | ORAL_TABLET | Freq: Four times a day (QID) | ORAL | Status: DC | PRN
  Administered 2019-05-12 – 2019-05-13 (×3): 1 mg via ORAL
  Filled 2019-05-12 (×3): qty 1

## 2019-05-12 MED ORDER — ACETAMINOPHEN 650 MG RE SUPP: 650.0000 mg | Freq: Four times a day (QID) | RECTAL | Status: DC | PRN

## 2019-05-12 MED ORDER — IBUPROFEN 800 MG PO TABS
800.0000 mg | ORAL_TABLET | Freq: Once | ORAL | Status: AC
  Administered 2019-05-12: 800 mg via ORAL
  Filled 2019-05-12: qty 1

## 2019-05-12 MED ORDER — LACTATED RINGERS IV SOLN
INTRAVENOUS | Status: DC
  Administered 2019-05-12 – 2019-05-13 (×4): via INTRAVENOUS

## 2019-05-12 MED ORDER — ONDANSETRON HCL 4 MG/2ML IJ SOLN: 4.0000 mg | Freq: Four times a day (QID) | INTRAMUSCULAR | Status: DC | PRN

## 2019-05-12 NOTE — ED Provider Notes (Signed)
Discussed CT and lab findings with patient.  She still feels uncomfortable.  She is still tachycardic. CT findings revealed potential cystitis which may be cause of fever.  She also has abnormal uterus that will need close outpatient follow-up with gynecology as well as ultrasound imaging to evaluate for any sort of malignancy.  Patient is aware of this.  Discussed with Dr. Hal Hope for admission   Judith Fraise, MD 05/12/19 [facility] [place]

## 2019-05-12 NOTE — ED Notes (Signed)
Date and time results received: 05/12/19 8:12 AM  (use smartphrase ".now" to insert current time)  Test: lactic acid Critical Value: 2.4  Name of Provider Notified: Kaitlin RN  Orders Received? Or Actions Taken?:

## 2019-05-12 NOTE — Progress Notes (Signed)
On arrival to unit patients MEWS score a red 6 with initial VS.  Rapid RN and MD made aware.  Patient tachy and with fever.  Tylenol and pain meds given.  Will continue to monitor with frequent VS alogorthm

## 2019-05-12 NOTE — Progress Notes (Signed)
Subjective: Patient admitted this morning, see detailed H&P by Dr. Hal Hope 48 year old female with a history of hypertension, alcohol abuse admitted in July 2000 22 months ago for vaginal bleeding at that time she was placed on IV Premarin and discharged home on Megace tablets.  Patient says that she was having vaginal bleeding which started after few days again.  Over past 1 week she is having fever, chills.  Worsening abdominal pain.  Intermittent vaginal bleeding.  Patient is supposed to follow-up with OB/GYN on September 24.  She was found to have fever with chills, started on ceftriaxone for possible UTI.  Vitals:   05/12/19 1347 05/12/19 1403  BP: 139/84 (!) 146/87  Pulse: (!) 137 (!) 132  Resp: (!) 22 (!) 22  Temp: 99.6 F (37.6 C) (!) 103.2 F (39.6 C)  SpO2: 95% 93%      A/P Sepsis due to pelvic inflammatory disease-patient did have uterine tenderness with bilateral adnexal tenderness on GYN examination by the ED physician.  I called and discussed with OB/GYN on-call Dr. [name], and she agrees with pelvic inflammatory disease causing patient's symptoms.  She recommends to continue with ceftriaxone, also add doxycycline and Flagyl for chlamydial and anaerobic coverage.  We will also start Megace 40 mg p.o. twice daily for vaginal bleeding.  Patient can be discharged on Megace once she is medically stable for discharge.    [facility] [phone]

## 2019-05-12 NOTE — H&P (Signed)
History and Physical    Judith Drake [id number] DOB: 1971-02-06 DOA: 05/11/2019  PCP: Burnard Hawthorne, FNP  Patient coming from: Home.  Chief Complaint: Abdominal pain fever chills.  HPI: Judith Drake is a 48 y.o. female with history of hypertension alcohol abuse admitted in July 2 week 2020 almost 2 months ago for vaginal bleeding at that time patient was placed on IV Premarin and discharged home on Megace states she did well for a few days after which she started having again bleeding but not as bad as what she had initially during that admission.  Over the last 1 week patient has been having fever chills.  Increasing abdominal pain mostly in the lower abdomen around the pelvic area.  Bleeding has been intermittent not as bad as what it was 2 months ago.  Denies any chest pain or productive cough.  ED Course: In the ER patient is found to be febrile with temperature of 102.8 F tachycardic in the 140s blood work show leukocytosis with WBC count of 19.5 hemoglobin 0.8 elevated LFTs CT scan of the abdomen pelvis done shows enlarged uterus concerning for adenomyosis and also lymphadenopathy for which we cannot rule out any malignancy.  Also was seen cystitis and pelvic congestion.  There was some signs of ascending urinary tract infection and possible colitis.  Patient denies any diarrhea.  COVID-19 test is negative.  Chest x-ray unremarkable.  Mild hypokalemia.  Patient admitted for possible developing sepsis from UTI and vaginal bleed.  Review of Systems: As per HPI, rest all negative.   Past Medical History:  Diagnosis Date  . Anemia   . ETOH abuse   . Fibroid   . GERD (gastroesophageal reflux disease)   . Heavy menstrual period   . Hypertension     Past Surgical History:  Procedure Laterality Date  . ELBOW SURGERY     1997   . TUBAL LIGATION       reports that she has been smoking cigarettes. She uses smokeless tobacco. She reports current alcohol use. She  reports that she does not use drugs.  Allergies  Allergen Reactions  . Amoxicillin Nausea And Vomiting    Has patient had a PCN reaction causing immediate rash, facial/tongue/throat swelling, SOB or lightheadedness with hypotension: No Has patient had a PCN reaction causing severe rash involving mucus membranes or skin necrosis: No Has patient had a PCN reaction that required hospitalization: No Has patient had a PCN reaction occurring within the last 10 years: No If all of the above answers are "NO", then may proceed with Cephalosporin use.   Marland Kitchen Morphine And Related Itching    Family History  Problem Relation Age of Onset  . Cancer Mother 59       Lung   . Hyperlipidemia Mother   . Hypertension Mother   . Stroke Mother   . Kidney disease Mother   . Diabetes Mother   . Heart disease Mother   . Hyperlipidemia Father   . Autism Son   . Cancer Paternal Grandfather 93       Colon Cancer - died  . Breast cancer Neg Hx     Prior to Admission medications   Medication Sig Start Date End Date Taking? Authorizing Provider  amLODipine (NORVASC) 10 MG tablet Take 1 tablet (10 mg total) by mouth daily. 04/20/18  Yes Arnett, Yvetta Coder, FNP  Aspirin-Salicylamide-Caffeine (BC HEADACHE PO) Take 1 packet by mouth daily as needed (pain and headache).  Yes [provider]  ferrous sulfate 325 (65 FE) MG tablet TAKE 1 TABLET (325 MG TOTAL) BY MOUTH 2 (TWO) TIMES DAILY WITH A MEAL. 10/24/18  Yes Burnard Hawthorne, FNP  Multiple Vitamins-Minerals (MULTIVITAMIN ADULT) TABS Take 1 tablet by mouth daily.   Yes [provider]  acetaminophen (TYLENOL) 325 MG tablet Take 2 tablets (650 mg total) by mouth every 6 (six) hours as needed for mild pain (or Fever >/= 101). Patient not taking: Reported on 06/09/2018 04/13/18   Nicholes Mango, MD  busPIRone (BUSPAR) 10 MG tablet TAKE 1 TABLET BY MOUTH THREE TIMES A DAY Patient not taking: No sig reported 04/28/18   Burnard Hawthorne, FNP   cyanocobalamin ([facility] VITAMIN B12) 1000 MCG tablet Take 1 tablet (1,000 mcg total) by mouth daily. Patient not taking: Reported on 03/18/2019 07/29/18   Burnard Hawthorne, FNP  folic acid (FOLVITE) 1 MG tablet Take 1 tablet (1 mg total) by mouth daily. Patient not taking: Reported on 05/11/2019 04/13/18   Nicholes Mango, MD  gabapentin (NEURONTIN) 100 MG capsule Take 2 capsules (200 mg total) by mouth at bedtime. Patient not taking: Reported on 06/09/2018 02/08/18   Lyndal Pulley, DO  Multiple Vitamin (MULTIVITAMIN WITH MINERALS) TABS tablet Take 1 tablet by mouth daily. Patient not taking: Reported on 03/28/2019 04/13/18   Nicholes Mango, MD  thiamine 100 MG tablet Take 1 tablet (100 mg total) by mouth daily. Patient not taking: Reported on 06/09/2018 04/13/18   Nicholes Mango, MD    Physical Exam: Constitutional: Moderately built and nourished. Vitals:   05/12/19 0415 05/12/19 0501 05/12/19 0545 05/12/19 0630  BP: 116/60 122/76 (!) 101/59 104/74  Pulse: (!) 102 (!) 110 98 (!) 109  Resp: 16 20 14 15   Temp:      TempSrc:      SpO2: 96% 99% 97% 97%   Eyes: Anicteric no pallor. ENMT: No discharge from the ears eyes nose or mouth. Neck: No mass felt.  No neck rigidity. Respiratory: No rhonchi or crepitations. Cardiovascular: S1-S2 heard. Abdomen: Soft nontender bowel sounds present.  Distended. Musculoskeletal: No edema. Skin: No rash. Neurologic: Alert awake oriented to time place and person.  Moves all extremities. Psychiatric: Appears normal.   Labs on Admission: I have personally reviewed following labs and imaging studies  CBC: Recent Labs  Lab 05/11/19 1904 05/11/19 2025  WBC 19.5* 19.9*  NEUTROABS 17.2* 17.7*  HGB 11.8* 10.9*  HCT 36.2 33.9*  MCV 89.8 90.2  PLT 255 [id number]   Basic Metabolic Panel: Recent Labs  Lab 05/11/19 1904  NA 134*  K 3.1*  CL 99  CO2 22  GLUCOSE 115*  BUN 6  CREATININE 0.66  CALCIUM 9.4   GFR: CrCl cannot be calculated (Unknown ideal weight.).  Liver Function Tests: Recent Labs  Lab 05/11/19 1904  AST 65*  ALT 86*  ALKPHOS 74  BILITOT 0.6  PROT 7.9  ALBUMIN 4.2   No results for input(s): LIPASE, AMYLASE in the last 168 hours. No results for input(s): AMMONIA in the last 168 hours. Coagulation Profile: No results for input(s): INR, PROTIME in the last 168 hours. Cardiac Enzymes: Recent Labs  Lab 05/11/19 1955  CKTOTAL 55   BNP (last 3 results) No results for input(s): PROBNP in the last 8760 hours. HbA1C: No results for input(s): HGBA1C in the last 72 hours. CBG: No results for input(s): GLUCAP in the last 168 hours. Lipid Profile: No results for input(s): CHOL, HDL, LDLCALC, TRIG, CHOLHDL, LDLDIRECT  in the last 72 hours. Thyroid Function Tests: Recent Labs    05/11/19 1955  TSH 1.645   Anemia Panel: No results for input(s): VITAMINB12, FOLATE, FERRITIN, TIBC, IRON, RETICCTPCT in the last 72 hours. Urine analysis:    Component Value Date/Time   COLORURINE RED (A) 05/11/2019 2018   APPEARANCEUR TURBID (A) 05/11/2019 2018   LABSPEC  05/11/2019 2018    TEST NOT REPORTED DUE TO COLOR INTERFERENCE OF URINE PIGMENT   PHURINE  05/11/2019 2018    TEST NOT REPORTED DUE TO COLOR INTERFERENCE OF URINE PIGMENT   GLUCOSEU (A) 05/11/2019 2018    TEST NOT REPORTED DUE TO COLOR INTERFERENCE OF URINE PIGMENT   HGBUR (A) 05/11/2019 2018    TEST NOT REPORTED DUE TO COLOR INTERFERENCE OF URINE PIGMENT   BILIRUBINUR (A) 05/11/2019 2018    TEST NOT REPORTED DUE TO COLOR INTERFERENCE OF URINE PIGMENT   KETONESUR (A) 05/11/2019 2018    TEST NOT REPORTED DUE TO COLOR INTERFERENCE OF URINE PIGMENT   PROTEINUR (A) 05/11/2019 2018    TEST NOT REPORTED DUE TO COLOR INTERFERENCE OF URINE PIGMENT   UROBILINOGEN 0.2 06/02/2013 1800   NITRITE (A) 05/11/2019 2018    TEST NOT REPORTED DUE TO COLOR INTERFERENCE OF URINE PIGMENT   LEUKOCYTESUR (A) 05/11/2019 2018    TEST NOT REPORTED DUE TO COLOR INTERFERENCE OF URINE PIGMENT    Sepsis Labs: @LABRCNTIP (procalcitonin:4,lacticidven:4) ) Recent Results (from the past 240 hour(s))  SARS Coronavirus 2 [facility] order, Performed in [facility] hospital lab) Nasopharyngeal Nasopharyngeal Swab     Status: None   Collection Time: 05/11/19  9:31 PM   Specimen: Nasopharyngeal Swab  Result Value Ref Range Status   SARS Coronavirus 2 NEGATIVE NEGATIVE Final    Comment: (NOTE) If result is NEGATIVE SARS-CoV-2 target nucleic acids are NOT DETECTED. The SARS-CoV-2 RNA is generally detectable in upper and lower  respiratory specimens during the acute phase of infection. The lowest  concentration of SARS-CoV-2 viral copies this assay can detect is 250  copies / mL. A negative result does not preclude SARS-CoV-2 infection  and should not be used as the sole basis for treatment or other  patient management decisions.  A negative result may occur with  improper specimen collection / handling, submission of specimen other  than nasopharyngeal swab, presence of viral mutation(s) within the  areas targeted by this assay, and inadequate number of viral copies  (<250 copies / mL). A negative result must be combined with clinical  observations, patient history, and epidemiological information. If result is POSITIVE SARS-CoV-2 target nucleic acids are DETECTED. The SARS-CoV-2 RNA is generally detectable in upper and lower  respiratory specimens dur ing the acute phase of infection.  Positive  results are indicative of active infection with SARS-CoV-2.  Clinical  correlation with patient history and other diagnostic information is  necessary to determine patient infection status.  Positive results do  not rule out bacterial infection or co-infection with other viruses. If result is PRESUMPTIVE POSTIVE SARS-CoV-2 nucleic acids MAY BE PRESENT.   A presumptive positive result was obtained on the submitted specimen  and confirmed on repeat testing.  While 2019 novel coronavirus   (SARS-CoV-2) nucleic acids may be present in the submitted sample  additional confirmatory testing may be necessary for epidemiological  and / or clinical management purposes  to differentiate between  SARS-CoV-2 and other Sarbecovirus currently known to infect humans.  If clinically indicated additional testing with an alternate test  methodology [phone])  is advised. The SARS-CoV-2 RNA is generally  detectable in upper and lower respiratory sp ecimens during the acute  phase of infection. The expected result is Negative. Fact Sheet for Patients:  StrictlyIdeas.no Fact Sheet for Healthcare Providers: BankingDealers.co.za This test is not yet approved or cleared by the [place] FDA and has been authorized for detection and/or diagnosis of SARS-CoV-2 by FDA under an Emergency Use Authorization (EUA).  This EUA will remain in effect (meaning this test can be used) for the duration of the COVID-19 declaration under Section 564(b)(1) of the Act, 21 U.S.C. section 360bbb-3(b)(1), unless the authorization is terminated or revoked sooner. Performed at [facility], [place] [address] [place], [place] [address]      Radiological Exams on Admission: Ct Abdomen Pelvis W Contrast  Result Date: 05/12/2019 CLINICAL DATA:  Acute generalized abdominal pain with fever, dysmenorrhea EXAM: CT ABDOMEN AND PELVIS WITH CONTRAST TECHNIQUE: Multidetector CT imaging of the abdomen and pelvis was performed using the standard protocol following bolus administration of intravenous contrast. CONTRAST:  171mL OMNIPAQUE IOHEXOL 300 MG/ML  SOLN COMPARISON:  CT abdomen pelvis 06/02/2013 FINDINGS: Lower chest: Lung bases are clear. Normal heart size. No pericardial effusion. Hepatobiliary: No focal liver abnormality is seen. No gallstones, gallbladder wall thickening, or biliary dilatation. Pancreas: Unremarkable. No pancreatic ductal dilatation or  surrounding inflammatory changes. Spleen: Normal in size without focal abnormality. Adrenals/Urinary Tract: Normal adrenal glands. No concerning renal mass. There is mild prominence of the bilateral renal collecting systems with slight urothelial thickening. A right extrarenal pelvis is similar to prior. No obstructing urolithiasis. There is circumferential bladder wall thickening with perivesicular haze. Stomach/Bowel: Small sliding-type hiatal hernia. Duodenal sweep is normal orientation. No small bowel dilatation or wall thickening. A normal appendix is visualized. Mild intramural fat of the cecum and ascending colon, nonspecific. No colonic dilatation or wall thickening. Vascular/Lymphatic: The aorta is normal caliber. There is clustered retroperitoneal adenopathy with several borderline enlarged lymph nodes present in the periaortic, aortocaval and inguinal lymphatic changes. Example lymph nodes include a 17 mm lymph node in the left iliac chain with some central hypoattenuation suggesting necrosis (2/67) a similar-appearing right iliac node measures 11.6 cm (2/64). Reproductive: There is marked enlargement of the uterus with extensive myometrial thickening and scattered intramural punctate hypoattenuating foci. The endometrial canal as a relatively bland appearance. There is fluid in the endocervical canal and within the vaginal vault. Small amount of fluid is present along the broad ligament with venous engorgement of the parametrial vessels. Few normal follicles are present in the right ovary. Other: No abdominopelvic free fluid or free gas. No bowel containing hernias. Musculoskeletal: Multilevel degenerative changes are present in the imaged portions of the spine. IMPRESSION: 1. Marked enlargement of the uterus with extensive myometrial thickening and scattered intramural punctate hypoattenuating foci. Findings could reflect a benign process such as adenomyosis though underlying malignancy is not fully  excluded given concurrent adenopathy as below. Recommend further evaluation with pelvic ultrasound as clinically indicated. 2. Retroperitoneal and pelvic lymphadenopathy, concerning for metastatic disease versus lymphoproliferative disorder. 3. Circumferential bladder wall thickening with perivesicular haze, suggestive of cystitis. Mild prominence of the bilateral renal collecting systems with slight urothelial thickening, which could reflect ascending urinary tract infection. Correlate with urinalysis. 4. Small amount of fluid along the broad ligament with venous engorgement of the parametrial vessels, which can be seen with pelvic congestion syndrome in the appropriate clinical setting. 5. Mild intramural fat of the cecum and ascending colon, nonspecific, but can  be seen in the setting of remote inflammatory bowel disease or secondary to body habitus. Correlate with history. 6. Small sliding-type hiatal hernia. Electronically Signed   By: Lovena Le M.D.   On: 05/12/2019 00:46   Dg Chest Port 1 View  Result Date: 05/11/2019 CLINICAL DATA:  Fever and body aches. EXAM: PORTABLE CHEST 1 VIEW COMPARISON:  06/09/2018 FINDINGS: The cardiomediastinal contours are normal. The lungs are clear. Pulmonary vasculature is normal. No consolidation, pleural effusion, or pneumothorax. No acute osseous abnormalities are seen. IMPRESSION: No active disease. Electronically Signed   By: Keith Rake M.D.   On: 05/11/2019 21:35     Assessment/Plan Principal Problem:   SIRS (systemic inflammatory response syndrome) (HCC) Active Problems:   HTN (hypertension)   Alcohol abuse   Acute lower UTI    1. SIRS with possible developing sepsis secondary to urinary tract infection on empiric antibiotics follow cultures continue hydration follow lactate procalcitonin levels. 2. Vaginal bleed with a large uterus -please consult Dr. Atlee Abide gynecologist.  Patient has appointment with Dr. Ilda Basset in September 24.   Follow CBC.  Type and screen. 3. Hypertension on amlodipine. 4. Anemia likely from blood loss follow CBC.  On iron supplements. 5. Alcohol abuse on CIWA protocol. 6. LFTs elevated likely from alcohol abuse or possible developing sepsis.  Follow LFTs closely.  Given that patient has a septic picture and will need close monitoring and will need inpatient status.  DVT prophylaxis: SCDs due to patient having active vaginal bleed. Code Status: Full code. Family Communication: Discussed with patient. Disposition Plan: Home. Consults called: None. Admission status: Inpatient.   Rise Patience MD [facility] Pager [phone].  If 7PM-7AM, please contact night-coverage [email or web address] Password [email or web address]  05/12/2019, 6:59 AM

## 2019-05-12 NOTE — Progress Notes (Signed)
Patients temperature remains elevated @103 .5 despite tylenol given this evening  MD paged and made aware. Will continue to monitor.

## 2019-05-12 NOTE — Progress Notes (Signed)
OB/GYN follow-up note Please see note dated 03/21/2019 for full discussion of GYN needs. Patient previously scheduled with Dr. Ilda Basset on 7/21 for an outpt.visit and this appointment was canceled. She has f/u scheduled for 06/01/19 at 10:30. She is now admitted with presumed UTI vs. PID. Advised to add doxy and flagyl for 14 days of treatment total. May use Megace for bleeding. Hgb is very stable. Patient clearly needs outpt GYN f/u. Please encourage her to keep this appointment. We are available to see her if needed. Please call [phone] if you desire in-person consult.

## 2019-05-12 NOTE — ED Notes (Signed)
Patient ambulated to the RR without assistance. Patient has been eating and drinking fluids, no issues, no N/V.

## 2019-05-12 NOTE — Progress Notes (Signed)
Patients temperature remains elevated despite tylenol given earlier this afternoon.  MD paged and made aware - orders to give tylenol now.  Will continue to monitor

## 2019-05-13 DIAGNOSIS — I10 Essential (primary) hypertension: Secondary | ICD-10-CM

## 2019-05-13 DIAGNOSIS — N390 Urinary tract infection, site not specified: Secondary | ICD-10-CM

## 2019-05-13 DIAGNOSIS — N852 Hypertrophy of uterus: Secondary | ICD-10-CM

## 2019-05-13 DIAGNOSIS — F1010 Alcohol abuse, uncomplicated: Secondary | ICD-10-CM

## 2019-05-13 LAB — COMPREHENSIVE METABOLIC PANEL
ALT: 57 U/L — ABNORMAL HIGH (ref 0–44)
AST: 47 U/L — ABNORMAL HIGH (ref 15–41)
Albumin: 3.4 g/dL — ABNORMAL LOW (ref 3.5–5.0)
Alkaline Phosphatase: 63 U/L (ref 38–126)
Anion gap: 11 (ref 5–15)
BUN: 6 mg/dL (ref 6–20)
CO2: 24 mmol/L (ref 22–32)
Calcium: 8.8 mg/dL — ABNORMAL LOW (ref 8.9–10.3)
Chloride: 103 mmol/L (ref 98–111)
Creatinine, Ser: 0.63 mg/dL (ref 0.44–1.00)
GFR calc Af Amer: >60 mL/min (ref >60)
GFR calc non Af Amer: >60 mL/min (ref >60)
Glucose, Bld: 91 mg/dL (ref 70–99)
Potassium: 4.1 mmol/L (ref 3.5–5.1)
Sodium: 138 mmol/L (ref 135–145)
Total Bilirubin: 0.4 mg/dL (ref 0.3–1.2)
Total Protein: 6.8 g/dL (ref 6.5–8.1)

## 2019-05-13 LAB — CBC
HCT: 33.4 % — ABNORMAL LOW (ref 36.0–46.0)
Hemoglobin: 10.3 g/dL — ABNORMAL LOW (ref 12.0–15.0)
MCH: 28.5 pg (ref 26.0–34.0)
MCHC: 30.8 g/dL (ref 30.0–36.0)
MCV: 92.5 fL (ref 80.0–100.0)
Platelets: 215 10*3/uL (ref 150–400)
RBC: 3.61 MIL/uL — ABNORMAL LOW (ref 3.87–5.11)
RDW: 17 % — ABNORMAL HIGH (ref 11.5–15.5)
WBC: 11.5 10*3/uL — ABNORMAL HIGH (ref 4.0–10.5)
nRBC: 0 % (ref 0.0–0.2)

## 2019-05-13 LAB — LACTIC ACID, PLASMA
Lactic Acid, Venous: 0.9 mmol/L (ref 0.5–1.9)
Lactic Acid, Venous: 1.2 mmol/L (ref 0.5–1.9)

## 2019-05-13 MED ORDER — SODIUM CHLORIDE 0.9 % IV SOLN
INTRAVENOUS | Status: DC
  Administered 2019-05-13 – 2019-05-14 (×3): via INTRAVENOUS

## 2019-05-13 NOTE — Progress Notes (Signed)
[facility]  PROGRESS NOTE  Judith Drake [id number] DOB: Dec 30, 1970 DOA: 05/11/2019 PCP: Burnard Hawthorne, FNP   Brief HPI:   48 year old female history of hypertension, alcohol abuse came with vaginal bleeding.  Over past 1 week patient is having fever, worsening abdominal pain, and vaginal bleeding.  Pelvic exam done in the ED showed bilateral adnexal tenderness with uterine tenderness.  Patient was empirically started on ceftriaxone for possible UTI.  I called and discussed with OB/GYN on-call Dr. [name], who recommended starting Flagyl and doxycycline in addition to ceftriaxone for possible pelvic inflammatory disease.    Subjective   Patient seen and examined, still complains of low abdominal pain.  Has been febrile last night.  WBC is down to 11.5.   Assessment/Plan:     1. Sepsis due to pelvic inflammatory disease-sepsis physiology has resolved.  WBC is down to 11.5, blood pressure is stable.  Patient was not hypotensive so he did not require 30 cc/kg fluid bolus.  Continue with ceftriaxone, Flagyl, doxycycline.      2. Vaginal bleeding-started on Megace 40 mg p.o. twice daily as per GYN recommendation.  3. Hypertension-blood pressure is stable, continue body pain.  4. Chronic anemia-due to vaginal bleeding, continue ferrous sulfate.  5. Alcohol abuse-no signs and symptoms of alcohol withdrawal, continue CIWA protocol.  6. Transaminitis-patient has mild transaminitis, AST 47, ALT 57.  Likely from alcohol use.  Will monitor.   CBC: Recent Labs  Lab 05/11/19 1904 05/11/19 2025 05/12/19 0726 05/13/19 0500  WBC 19.5* 19.9* 14.3* 11.5*  NEUTROABS 17.2* 17.7* 12.7*  --   HGB 11.8* 10.9* 11.1* 10.3*  HCT 36.2 33.9* 34.2* 33.4*  MCV 89.8 90.2 91.2 92.5  PLT 255 253 236 [id number]    Basic Metabolic Panel: Recent Labs  Lab 05/11/19 1904 05/12/19 0726 05/13/19 0500  NA 134* 136 138  K 3.1* 3.4* 4.1  CL 99 98 103  CO2 22 23 24   GLUCOSE 115* 105* 91   BUN 6 5* 6  CREATININE 0.66 0.75 0.63  CALCIUM 9.4 8.9 8.8*     DVT prophylaxis: Lovenox  Code Status: Full code  Family Communication: No family at bedside  Disposition Plan: likely home when medically ready for discharge     BMI  Estimated body mass index is 28.6 kg/m as calculated from the following:   Height as of this encounter: 5' 3.5" (1.613 m).   Weight as of this encounter: 74.4 kg.  Scheduled medications:  . amLODipine  10 mg Oral Daily  . enoxaparin (LOVENOX) injection  40 mg Subcutaneous Q24H  . ferrous sulfate  325 mg Oral BID WC  . folic acid  1 mg Oral Daily  . megestrol  40 mg Oral BID  . multivitamin with minerals  1 tablet Oral Daily  . thiamine  100 mg Oral Daily   Or  . thiamine  100 mg Intravenous Daily    Consultants:  Phone consultation with OB/GYN Dr. [name]  Procedures:  None   Antibiotics:   Anti-infectives (From admission, onward)   Start     Dose/Rate Route Frequency Ordered Stop   05/12/19 1600  cefTRIAXone (ROCEPHIN) 2 g in sodium chloride 0.9 % 100 mL IVPB     2 g 200 mL/hr over 30 Minutes Intravenous Every 24 hours 05/12/19 0658     05/12/19 1300  doxycycline (VIBRAMYCIN) 100 mg in sodium chloride 0.9 % 250 mL IVPB     100 mg 125 mL/hr over 120 Minutes  Intravenous Every 12 hours 05/12/19 1024     05/12/19 1230  metroNIDAZOLE (FLAGYL) IVPB 500 mg     500 mg 100 mL/hr over 60 Minutes Intravenous Every 8 hours 05/12/19 1027     05/12/19 0200  cefTRIAXone (ROCEPHIN) 1 g in sodium chloride 0.9 % 100 mL IVPB     1 g 200 mL/hr over 30 Minutes Intravenous  Once 05/12/19 0158 05/12/19 0256       Objective   Vitals:   05/13/19 0014 05/13/19 0407 05/13/19 0751 05/13/19 1024  BP:  (!) 98/55 130/88 122/77  Pulse:  (!) 102 (!) 117 (!) 108  Resp:  14 20 19   Temp: 99 F (37.2 C) 98.5 F (36.9 C) (!) 102.4 F (39.1 C) 99.7 F (37.6 C)  TempSrc:  Oral Oral Oral  SpO2:  100%  94%  Weight:      Height:         Intake/Output Summary (Last 24 hours) at 05/13/2019 1043 Last data filed at 05/13/2019 0700 Gross per 24 hour  Intake 4501.8 ml  Output 3550 ml  Net 951.8 ml   Filed Weights   05/12/19 1330  Weight: 74.4 kg     Physical Examination:    General: Appears in no acute distress  Cardiovascular: S1-S2, regular, no murmur auscultated  Respiratory: Clear to auscultation bilaterally, no wheezing or crackles  Abdomen: Soft, tenderness in both right and left lower quadrants, no rigidity or guarding, no organomegaly  Extremities: No edema in the lower extremities  Neurologic: Alert, oriented x3, no focal deficit noted     Data Reviewed: I have personally reviewed following labs and imaging studies   Recent Results (from the past 240 hour(s))  SARS Coronavirus 2 [facility] order, Performed in [place] hospital lab) Nasopharyngeal Nasopharyngeal Swab     Status: None   Collection Time: 05/11/19  9:31 PM   Specimen: Nasopharyngeal Swab  Result Value Ref Range Status   SARS Coronavirus 2 NEGATIVE NEGATIVE Final    Comment: (NOTE) If result is NEGATIVE SARS-CoV-2 target nucleic acids are NOT DETECTED. The SARS-CoV-2 RNA is generally detectable in upper and lower  respiratory specimens during the acute phase of infection. The lowest  concentration of SARS-CoV-2 viral copies this assay can detect is 250  copies / mL. A negative result does not preclude SARS-CoV-2 infection  and should not be used as the sole basis for treatment or other  patient management decisions.  A negative result may occur with  improper specimen collection / handling, submission of specimen other  than nasopharyngeal swab, presence of viral mutation(s) within the  areas targeted by this assay, and inadequate number of viral copies  (<250 copies / mL). A negative result must be combined with clinical  observations, patient history, and epidemiological information. If result is POSITIVE SARS-CoV-2 target  nucleic acids are DETECTED. The SARS-CoV-2 RNA is generally detectable in upper and lower  respiratory specimens dur ing the acute phase of infection.  Positive  results are indicative of active infection with SARS-CoV-2.  Clinical  correlation with patient history and other diagnostic information is  necessary to determine patient infection status.  Positive results do  not rule out bacterial infection or co-infection with other viruses. If result is PRESUMPTIVE POSTIVE SARS-CoV-2 nucleic acids MAY BE PRESENT.   A presumptive positive result was obtained on the submitted specimen  and confirmed on repeat testing.  While 2019 novel coronavirus  (SARS-CoV-2) nucleic acids may be present in the submitted sample  additional confirmatory testing may be necessary for epidemiological  and / or clinical management purposes  to differentiate between  SARS-CoV-2 and other Sarbecovirus currently known to infect humans.  If clinically indicated additional testing with an alternate test  methodology [phone]) is advised. The SARS-CoV-2 RNA is generally  detectable in upper and lower respiratory sp ecimens during the acute  phase of infection. The expected result is Negative. Fact Sheet for Patients:  StrictlyIdeas.no Fact Sheet for Healthcare Providers: BankingDealers.co.za This test is not yet approved or cleared by the [place] FDA and has been authorized for detection and/or diagnosis of SARS-CoV-2 by FDA under an Emergency Use Authorization (EUA).  This EUA will remain in effect (meaning this test can be used) for the duration of the COVID-19 declaration under Section 564(b)(1) of the Act, 21 U.S.C. section 360bbb-3(b)(1), unless the authorization is terminated or revoked sooner. Performed at [facility], [place] [address] [place], [place] [address]      Liver Function Tests: Recent Labs  Lab 05/11/19 1904  05/12/19 0726 05/13/19 0500  AST 65* 49* 47*  ALT 86* 70* 57*  ALKPHOS 74 63 63  BILITOT 0.6 0.6 0.4  PROT 7.9 7.2 6.8  ALBUMIN 4.2 3.7 3.4*   No results for input(s): LIPASE, AMYLASE in the last 168 hours. No results for input(s): AMMONIA in the last 168 hours.  Cardiac Enzymes: Recent Labs  Lab 05/11/19 1955  CKTOTAL 55   BNP (last 3 results) No results for input(s): BNP in the last 8760 hours.  ProBNP (last 3 results) No results for input(s): PROBNP in the last 8760 hours.    Studies: Ct Abdomen Pelvis W Contrast  Result Date: 05/12/2019 CLINICAL DATA:  Acute generalized abdominal pain with fever, dysmenorrhea EXAM: CT ABDOMEN AND PELVIS WITH CONTRAST TECHNIQUE: Multidetector CT imaging of the abdomen and pelvis was performed using the standard protocol following bolus administration of intravenous contrast. CONTRAST:  160mL OMNIPAQUE IOHEXOL 300 MG/ML  SOLN COMPARISON:  CT abdomen pelvis 06/02/2013 FINDINGS: Lower chest: Lung bases are clear. Normal heart size. No pericardial effusion. Hepatobiliary: No focal liver abnormality is seen. No gallstones, gallbladder wall thickening, or biliary dilatation. Pancreas: Unremarkable. No pancreatic ductal dilatation or surrounding inflammatory changes. Spleen: Normal in size without focal abnormality. Adrenals/Urinary Tract: Normal adrenal glands. No concerning renal mass. There is mild prominence of the bilateral renal collecting systems with slight urothelial thickening. A right extrarenal pelvis is similar to prior. No obstructing urolithiasis. There is circumferential bladder wall thickening with perivesicular haze. Stomach/Bowel: Small sliding-type hiatal hernia. Duodenal sweep is normal orientation. No small bowel dilatation or wall thickening. A normal appendix is visualized. Mild intramural fat of the cecum and ascending colon, nonspecific. No colonic dilatation or wall thickening. Vascular/Lymphatic: The aorta is normal caliber. There  is clustered retroperitoneal adenopathy with several borderline enlarged lymph nodes present in the periaortic, aortocaval and inguinal lymphatic changes. Example lymph nodes include a 17 mm lymph node in the left iliac chain with some central hypoattenuation suggesting necrosis (2/67) a similar-appearing right iliac node measures 11.6 cm (2/64). Reproductive: There is marked enlargement of the uterus with extensive myometrial thickening and scattered intramural punctate hypoattenuating foci. The endometrial canal as a relatively bland appearance. There is fluid in the endocervical canal and within the vaginal vault. Small amount of fluid is present along the broad ligament with venous engorgement of the parametrial vessels. Few normal follicles are present in the right ovary. Other: No abdominopelvic free fluid or free gas.  No bowel containing hernias. Musculoskeletal: Multilevel degenerative changes are present in the imaged portions of the spine. IMPRESSION: 1. Marked enlargement of the uterus with extensive myometrial thickening and scattered intramural punctate hypoattenuating foci. Findings could reflect a benign process such as adenomyosis though underlying malignancy is not fully excluded given concurrent adenopathy as below. Recommend further evaluation with pelvic ultrasound as clinically indicated. 2. Retroperitoneal and pelvic lymphadenopathy, concerning for metastatic disease versus lymphoproliferative disorder. 3. Circumferential bladder wall thickening with perivesicular haze, suggestive of cystitis. Mild prominence of the bilateral renal collecting systems with slight urothelial thickening, which could reflect ascending urinary tract infection. Correlate with urinalysis. 4. Small amount of fluid along the broad ligament with venous engorgement of the parametrial vessels, which can be seen with pelvic congestion syndrome in the appropriate clinical setting. 5. Mild intramural fat of the cecum and  ascending colon, nonspecific, but can be seen in the setting of remote inflammatory bowel disease or secondary to body habitus. Correlate with history. 6. Small sliding-type hiatal hernia. Electronically Signed   By: Lovena Le M.D.   On: 05/12/2019 00:46   Dg Chest Port 1 View  Result Date: 05/11/2019 CLINICAL DATA:  Fever and body aches. EXAM: PORTABLE CHEST 1 VIEW COMPARISON:  06/09/2018 FINDINGS: The cardiomediastinal contours are normal. The lungs are clear. Pulmonary vasculature is normal. No consolidation, pleural effusion, or pneumothorax. No acute osseous abnormalities are seen. IMPRESSION: No active disease. Electronically Signed   By: Keith Rake M.D.   On: 05/11/2019 21:35     Admission status: Inpatient: Based on patients clinical presentation and evaluation of above clinical data, I have made determination that patient meets Inpatient criteria at this time.  Time spent: 20 minutes  [facility] Pager [phone]. If 7PM-7AM, please contact night-coverage at [email or web address], Office  [phone]  password [email or web address]  05/13/2019, 10:43 AM  LOS: 1 day

## 2019-05-14 DIAGNOSIS — N730 Acute parametritis and pelvic cellulitis: Secondary | ICD-10-CM

## 2019-05-14 MED ORDER — DOXYCYCLINE MONOHYDRATE 100 MG PO TABS: 100.0000 mg | ORAL_TABLET | Freq: Two times a day (BID) | ORAL | 0 refills | Status: AC

## 2019-05-14 MED ORDER — CEFPODOXIME PROXETIL 200 MG PO TABS: 200.0000 mg | ORAL_TABLET | Freq: Two times a day (BID) | ORAL | 0 refills | Status: AC

## 2019-05-14 MED ORDER — TRAMADOL HCL 50 MG PO TABS: 50.0000 mg | ORAL_TABLET | Freq: Four times a day (QID) | ORAL | 0 refills | Status: DC | PRN

## 2019-05-14 MED ORDER — METRONIDAZOLE 500 MG PO TABS: 500.0000 mg | ORAL_TABLET | Freq: Three times a day (TID) | ORAL | 0 refills | Status: AC

## 2019-05-14 MED ORDER — MEGESTROL ACETATE 40 MG PO TABS: 40.0000 mg | ORAL_TABLET | Freq: Two times a day (BID) | ORAL | 1 refills | Status: DC

## 2019-05-14 NOTE — Discharge Summary (Addendum)
Physician Discharge Summary  Judith Drake [id number] DOB: [birth year] DOA: 05/11/2019  PCP: Judith Hawthorne, FNP  Admit date: 05/11/2019 Discharge date: 05/14/2019  Time spent:  40 minutes  Recommendations for Outpatient Follow-up:  1. Follow-up OB/GYN on 06/01/2019  Discharge Diagnoses:  Principal Problem:   SIRS (systemic inflammatory response syndrome) (HCC) Active Problems:   HTN (hypertension)   Alcohol abuse   Acute lower UTI   Discharge Condition: Stable  Diet recommendation: Regular diet  Filed Weights   05/12/19 1330  Weight: 74.4 kg    History of present illness:   48 year old female history of hypertension, alcohol abuse came with vaginal bleeding.  Over past 1 week patient is having fever, worsening abdominal pain, and vaginal bleeding.  Pelvic exam done in the ED showed bilateral adnexal tenderness with uterine tenderness.  Patient was empirically started on ceftriaxone for possible UTI.  I called and discussed with OB/GYN on-call Dr. [name], who recommended starting Flagyl and doxycycline in addition to ceftriaxone for possible pelvic inflammatory disease  Hospital Course:  1. Sepsis due to pelvic inflammatory disease-sepsis physiology has resolved.  WBC is down to 11.5, blood pressure is stable.  Patient was not hypotensive so he did not require 30 cc/kg fluid bolus.    Patient was started on ceftriaxone, Flagyl, doxycycline.     Will discharge on p.o. Vantin 400 twice daily, Flagyl 500 mg p.o. 3 times daily, doxycycline 100 g p.o. twice daily for 11 more days.  She will complete 14 days of antibiotics for pelvic inflammatory disease.  Discharged on tramadol PRN for pain.   2. Vaginal bleeding-started on Megace 40 mg p.o. twice daily as per GYN recommendation.  3. Hypertension-blood pressure is stable, amlodipine  4. Chronic anemia-due to vaginal bleeding, continue ferrous sulfate.  5. Alcohol abuse-no signs and symptoms of alcohol withdrawal,    6. Transaminitis-patient has mild transaminitis, AST 47, ALT 57.  Likely from alcohol use.    Procedures:  None  Consultations:  Phone consultation OB/GYN, Dr. [name]  Discharge Exam: Vitals:   05/14/19 0430 05/14/19 1016  BP: 131/78 127/75  Pulse: (!) 104   Resp: 20   Temp: 99.2 F (37.3 C)   SpO2: 98%     General: Appears in no acute distress Cardiovascular: S1-S2, regular Respiratory: Clear to auscultation bilaterally  Discharge Instructions   Discharge Instructions    Diet - low sodium heart healthy   Complete by: As directed    Increase activity slowly   Complete by: As directed      Allergies as of 05/14/2019      Reactions   Amoxicillin Nausea And Vomiting   Has patient had a PCN reaction causing immediate rash, facial/tongue/throat swelling, SOB or lightheadedness with hypotension: No Has patient had a PCN reaction causing severe rash involving mucus membranes or skin necrosis: No Has patient had a PCN reaction that required hospitalization: No Has patient had a PCN reaction occurring within the last 10 years: No If all of the above answers are "NO", then may proceed with Cephalosporin use.   Morphine And Related Itching      Medication List    STOP taking these medications   acetaminophen 325 MG tablet Commonly known as: TYLENOL   BC HEADACHE PO   folic acid 1 MG tablet Commonly known as: FOLVITE   multivitamin with minerals Tabs tablet   thiamine 100 MG tablet     TAKE these medications   amLODipine 10 MG tablet Commonly known  as: NORVASC Take 1 tablet (10 mg total) by mouth daily.   busPIRone 10 MG tablet Commonly known as: BUSPAR TAKE 1 TABLET BY MOUTH THREE TIMES A DAY   cefpodoxime 200 MG tablet Commonly known as: VANTIN Take 1 tablet (200 mg total) by mouth 2 (two) times daily for 11 days.   cyanocobalamin 1000 MCG tablet Commonly known as: CVS VITAMIN B12 Take 1 tablet (1,000 mcg total) by mouth daily.    doxycycline 100 MG tablet Commonly known as: ADOXA Take 1 tablet (100 mg total) by mouth 2 (two) times daily for 11 days.   ferrous sulfate 325 (65 FE) MG tablet TAKE 1 TABLET (325 MG TOTAL) BY MOUTH 2 (TWO) TIMES DAILY WITH A MEAL.   gabapentin 100 MG capsule Commonly known as: NEURONTIN Take 2 capsules (200 mg total) by mouth at bedtime.   megestrol 40 MG tablet Commonly known as: MEGACE Take 1 tablet (40 mg total) by mouth 2 (two) times daily.   metroNIDAZOLE 500 MG tablet Commonly known as: Flagyl Take 1 tablet (500 mg total) by mouth 3 (three) times daily for 11 days.   Multivitamin Adult Tabs Take 1 tablet by mouth daily.   traMADol 50 MG tablet Commonly known as: Ultram Take 1 tablet (50 mg total) by mouth every 6 (six) hours as needed.      Allergies  Allergen Reactions  . Amoxicillin Nausea And Vomiting    Has patient had a PCN reaction causing immediate rash, facial/tongue/throat swelling, SOB or lightheadedness with hypotension: No Has patient had a PCN reaction causing severe rash involving mucus membranes or skin necrosis: No Has patient had a PCN reaction that required hospitalization: No Has patient had a PCN reaction occurring within the last 10 years: No If all of the above answers are "NO", then may proceed with Cephalosporin use.   Marland Kitchen Morphine And Related Itching      The results of significant diagnostics from this hospitalization (including imaging, microbiology, ancillary and laboratory) are listed below for reference.    Significant Diagnostic Studies: Ct Abdomen Pelvis W Contrast  Result Date: 05/12/2019 CLINICAL DATA:  Acute generalized abdominal pain with fever, dysmenorrhea EXAM: CT ABDOMEN AND PELVIS WITH CONTRAST TECHNIQUE: Multidetector CT imaging of the abdomen and pelvis was performed using the standard protocol following bolus administration of intravenous contrast. CONTRAST:  141mL OMNIPAQUE IOHEXOL 300 MG/ML  SOLN COMPARISON:  CT  abdomen pelvis 06/02/2013 FINDINGS: Lower chest: Lung bases are clear. Normal heart size. No pericardial effusion. Hepatobiliary: No focal liver abnormality is seen. No gallstones, gallbladder wall thickening, or biliary dilatation. Pancreas: Unremarkable. No pancreatic ductal dilatation or surrounding inflammatory changes. Spleen: Normal in size without focal abnormality. Adrenals/Urinary Tract: Normal adrenal glands. No concerning renal mass. There is mild prominence of the bilateral renal collecting systems with slight urothelial thickening. A right extrarenal pelvis is similar to prior. No obstructing urolithiasis. There is circumferential bladder wall thickening with perivesicular haze. Stomach/Bowel: Small sliding-type hiatal hernia. Duodenal sweep is normal orientation. No small bowel dilatation or wall thickening. A normal appendix is visualized. Mild intramural fat of the cecum and ascending colon, nonspecific. No colonic dilatation or wall thickening. Vascular/Lymphatic: The aorta is normal caliber. There is clustered retroperitoneal adenopathy with several borderline enlarged lymph nodes present in the periaortic, aortocaval and inguinal lymphatic changes. Example lymph nodes include a 17 mm lymph node in the left iliac chain with some central hypoattenuation suggesting necrosis (2/67) a similar-appearing right iliac node measures 11.6 cm (2/64). Reproductive: There  is marked enlargement of the uterus with extensive myometrial thickening and scattered intramural punctate hypoattenuating foci. The endometrial canal as a relatively bland appearance. There is fluid in the endocervical canal and within the vaginal vault. Small amount of fluid is present along the broad ligament with venous engorgement of the parametrial vessels. Few normal follicles are present in the right ovary. Other: No abdominopelvic free fluid or free gas. No bowel containing hernias. Musculoskeletal: Multilevel degenerative changes are  present in the imaged portions of the spine. IMPRESSION: 1. Marked enlargement of the uterus with extensive myometrial thickening and scattered intramural punctate hypoattenuating foci. Findings could reflect a benign process such as adenomyosis though underlying malignancy is not fully excluded given concurrent adenopathy as below. Recommend further evaluation with pelvic ultrasound as clinically indicated. 2. Retroperitoneal and pelvic lymphadenopathy, concerning for metastatic disease versus lymphoproliferative disorder. 3. Circumferential bladder wall thickening with perivesicular haze, suggestive of cystitis. Mild prominence of the bilateral renal collecting systems with slight urothelial thickening, which could reflect ascending urinary tract infection. Correlate with urinalysis. 4. Small amount of fluid along the broad ligament with venous engorgement of the parametrial vessels, which can be seen with pelvic congestion syndrome in the appropriate clinical setting. 5. Mild intramural fat of the cecum and ascending colon, nonspecific, but can be seen in the setting of remote inflammatory bowel disease or secondary to body habitus. Correlate with history. 6. Small sliding-type hiatal hernia. Electronically Signed   By: Lovena Le M.D.   On: 05/12/2019 00:46   Dg Chest Port 1 View  Result Date: 05/11/2019 CLINICAL DATA:  Fever and body aches. EXAM: PORTABLE CHEST 1 VIEW COMPARISON:  06/09/2018 FINDINGS: The cardiomediastinal contours are normal. The lungs are clear. Pulmonary vasculature is normal. No consolidation, pleural effusion, or pneumothorax. No acute osseous abnormalities are seen. IMPRESSION: No active disease. Electronically Signed   By: Keith Rake M.D.   On: 05/11/2019 21:35    Microbiology: Recent Results (from the past 240 hour(s))  SARS Coronavirus 2 [facility] order, Performed in [facility] hospital lab) Nasopharyngeal Nasopharyngeal Swab     Status: None   Collection Time:  05/11/19  9:31 PM   Specimen: Nasopharyngeal Swab  Result Value Ref Range Status   SARS Coronavirus 2 NEGATIVE NEGATIVE Final    Comment: (NOTE) If result is NEGATIVE SARS-CoV-2 target nucleic acids are NOT DETECTED. The SARS-CoV-2 RNA is generally detectable in upper and lower  respiratory specimens during the acute phase of infection. The lowest  concentration of SARS-CoV-2 viral copies this assay can detect is 250  copies / mL. A negative result does not preclude SARS-CoV-2 infection  and should not be used as the sole basis for treatment or other  patient management decisions.  A negative result may occur with  improper specimen collection / handling, submission of specimen other  than nasopharyngeal swab, presence of viral mutation(s) within the  areas targeted by this assay, and inadequate number of viral copies  (<250 copies / mL). A negative result must be combined with clinical  observations, patient history, and epidemiological information. If result is POSITIVE SARS-CoV-2 target nucleic acids are DETECTED. The SARS-CoV-2 RNA is generally detectable in upper and lower  respiratory specimens dur ing the acute phase of infection.  Positive  results are indicative of active infection with SARS-CoV-2.  Clinical  correlation with patient history and other diagnostic information is  necessary to determine patient infection status.  Positive results do  not rule out bacterial infection or co-infection with  other viruses. If result is PRESUMPTIVE POSTIVE SARS-CoV-2 nucleic acids MAY BE PRESENT.   A presumptive positive result was obtained on the submitted specimen  and confirmed on repeat testing.  While 2019 novel coronavirus  (SARS-CoV-2) nucleic acids may be present in the submitted sample  additional confirmatory testing may be necessary for epidemiological  and / or clinical management purposes  to differentiate between  SARS-CoV-2 and other Sarbecovirus currently known to  infect humans.  If clinically indicated additional testing with an alternate test  methodology [phone]) is advised. The SARS-CoV-2 RNA is generally  detectable in upper and lower respiratory sp ecimens during the acute  phase of infection. The expected result is Negative. Fact Sheet for Patients:  StrictlyIdeas.no Fact Sheet for Healthcare Providers: BankingDealers.co.za This test is not yet approved or cleared by the [place] FDA and has been authorized for detection and/or diagnosis of SARS-CoV-2 by FDA under an Emergency Use Authorization (EUA).  This EUA will remain in effect (meaning this test can be used) for the duration of the COVID-19 declaration under Section 564(b)(1) of the Act, 21 U.S.C. section 360bbb-3(b)(1), unless the authorization is terminated or revoked sooner. Performed at [facility], [place] [address] [place], [place] [address]      Labs: Basic Metabolic Panel: Recent Labs  Lab 05/11/19 1904 05/12/19 0726 05/13/19 0500  NA 134* 136 138  K 3.1* 3.4* 4.1  CL 99 98 103  CO2 22 23 24   GLUCOSE 115* 105* 91  BUN 6 5* 6  CREATININE 0.66 0.75 0.63  CALCIUM 9.4 8.9 8.8*   Liver Function Tests: Recent Labs  Lab 05/11/19 1904 05/12/19 0726 05/13/19 0500  AST 65* 49* 47*  ALT 86* 70* 57*  ALKPHOS 74 63 63  BILITOT 0.6 0.6 0.4  PROT 7.9 7.2 6.8  ALBUMIN 4.2 3.7 3.4*   No results for input(s): LIPASE, AMYLASE in the last 168 hours. No results for input(s): AMMONIA in the last 168 hours. CBC: Recent Labs  Lab 05/11/19 1904 05/11/19 2025 05/12/19 0726 05/13/19 0500  WBC 19.5* 19.9* 14.3* 11.5*  NEUTROABS 17.2* 17.7* 12.7*  --   HGB 11.8* 10.9* 11.1* 10.3*  HCT 36.2 33.9* 34.2* 33.4*  MCV 89.8 90.2 91.2 92.5  PLT 255 253 236 215   Cardiac Enzymes: Recent Labs  Lab 05/11/19 1955  CKTOTAL 55    Signed:  Oswald Hillock MD.  [facility] 05/14/2019, 10:37 AM

## 2019-05-16 ENCOUNTER — Telehealth: Payer: Self-pay | Admitting: Family

## 2019-05-21 ENCOUNTER — Other Ambulatory Visit: Payer: Self-pay | Admitting: Family

## 2019-06-01 ENCOUNTER — Ambulatory Visit: Payer: Self-pay | Admitting: Obstetrics and Gynecology

## 2019-06-08 ENCOUNTER — Other Ambulatory Visit: Payer: Self-pay | Admitting: Family

## 2019-06-08 MED ORDER — AMLODIPINE BESYLATE 10 MG PO TABS: 10.0000 mg | ORAL_TABLET | Freq: Every day | ORAL | 0 refills | Status: DC

## 2019-06-08 NOTE — Telephone Encounter (Signed)
Pt request refill   amLODipine (NORVASC) 10 MG tablet  Pt has appt 10/16, but will run out before her appt.  Please send to   [place], [place] [facility] [place] [phone] (Phone) [phone] (Fax)

## 2019-06-20 ENCOUNTER — Other Ambulatory Visit (HOSPITAL_COMMUNITY)
Admission: RE | Admit: 2019-06-20 | Discharge: 2019-06-20 | Disposition: A | Discharge status: Home / Self Care | Payer: No Typology Code available for payment source | Source: Ambulatory Visit | Attending: Obstetrics and Gynecology | Admitting: Obstetrics and Gynecology

## 2019-06-20 ENCOUNTER — Ambulatory Visit (INDEPENDENT_AMBULATORY_CARE_PROVIDER_SITE_OTHER): Payer: No Typology Code available for payment source | Admitting: Obstetrics and Gynecology

## 2019-06-20 ENCOUNTER — Other Ambulatory Visit: Payer: Self-pay

## 2019-06-20 ENCOUNTER — Other Ambulatory Visit: Payer: Self-pay | Admitting: Obstetrics and Gynecology

## 2019-06-20 ENCOUNTER — Encounter: Payer: Self-pay | Admitting: Obstetrics and Gynecology

## 2019-06-20 VITALS — BP 157/98 | HR 72 | Wt 163.0 lb

## 2019-06-20 DIAGNOSIS — N939 Abnormal uterine and vaginal bleeding, unspecified: Secondary | ICD-10-CM | POA: Diagnosis present

## 2019-06-20 DIAGNOSIS — Z3202 Encounter for pregnancy test, result negative: Secondary | ICD-10-CM

## 2019-06-20 LAB — POCT URINE PREGNANCY: Preg Test, Ur: NEGATIVE

## 2019-06-20 MED ORDER — MEGESTROL ACETATE 40 MG PO TABS: 40.0000 mg | ORAL_TABLET | Freq: Two times a day (BID) | ORAL | 1 refills | Status: DC

## 2019-06-20 NOTE — Progress Notes (Signed)
Patient is here today for pap and endometrial biopsy for abnormal bleeding. She reports not been on any birth control at this time.   Last pap? Mammogram?

## 2019-06-20 NOTE — Progress Notes (Signed)
Obstetrics and Gynecology Established Patient Evaluation  Appointment Date: 06/20/2019  OBGYN Clinic: [facility]  Primary Care Provider: Burnard Hawthorne  Referring Provider: Burnard Hawthorne, FNP  Chief Complaint: AUB, follow up hospitalization  History of Present Illness: Judith Drake is a 48 y.o. Caucasian G2P2002 (Patient's last menstrual period was 06/20/2019.), seen for the above chief complaint. See July consult note  Period starting yesterday but she does have AUB but bid megace does help.   Review of Systems: as noted in the History of Present Illness.  Past Medical History:  Past Medical History:  Diagnosis Date  . Anemia   . ETOH abuse   . Fibroid   . GERD (gastroesophageal reflux disease)   . Heavy menstrual period   . Hypertension     Past Surgical History:  Past Surgical History:  Procedure Laterality Date  . ELBOW SURGERY     1997   . TUBAL LIGATION      Past Obstetrical History:  OB History  Gravida Para Term Preterm AB Living  2 2 2     2   SAB TAB Ectopic Multiple Live Births          2    # Outcome Date GA Lbr Len/2nd Weight Sex Delivery Anes PTL Lv  2 Term           1 Term             Obstetric Comments  Vaginal x 2    Past Gynecological History: As per HPI. Periods: as per HPI History of pap smears: Yes. Last pap smear unsure but no h/o abnormal paps per patient HRT use: no  Social History:  Social History   Socioeconomic History  . Marital status: Married    Spouse name: Judith Drake  . Number of children: 2  . Years of education: 17  . Highest education level: Not on file  Occupational History  . Occupation: Agricultural engineer   Social Needs  . Financial resource strain: Not on file  . Food insecurity    Worry: Not on file    Inability: Not on file  . Transportation needs    Medical: Not on file    Non-medical: Not on file  Tobacco Use  . Smoking status: Light Tobacco Smoker    Types:  Cigarettes  . Smokeless tobacco: Current User  Substance and Sexual Activity  . Alcohol use: Yes  . Drug use: No  . Sexual activity: Yes    Partners: Male    Birth control/protection: Surgical  Lifestyle  . Physical activity    Days per week: Not on file    Minutes per session: Not on file  . Stress: Not on file  Relationships  . Social Herbalist on phone: Not on file    Gets together: Not on file    Attends religious service: Not on file    Active member of club or organization: Not on file    Attends meetings of clubs or organizations: Not on file    Relationship status: Not on file  . Intimate partner violence    Fear of current or ex partner: Not on file    Emotionally abused: Not on file    Physically abused: Not on file    Forced sexual activity: Not on file  Other Topics Concern  . Not on file  Social History Narrative   Judith Drake grew up in [place]. She attended [employer] for  a certificate in accounting. She currently lives a home with her husband and 2 boys. Her oldest son has autism. She enjoys going out with her family.     Family History:  Family History  Problem Relation Age of Onset  . Cancer Mother 63       Lung   . Hyperlipidemia Mother   . Hypertension Mother   . Stroke Mother   . Kidney disease Mother   . Diabetes Mother   . Heart disease Mother   . Hyperlipidemia Father   . Autism Son   . Cancer Paternal Grandfather 25       Colon Cancer - died  . Breast cancer Neg Hx    She denies any female cancers, bleeding or blood clotting disorders.    Medications [name]. [name] had no medications administered during this visit. Current Outpatient Medications  Medication Sig Dispense Refill  . amLODipine (NORVASC) 10 MG tablet Take 1 tablet (10 mg total) by mouth daily. 15 tablet 0  . busPIRone (BUSPAR) 10 MG tablet TAKE 1 TABLET BY MOUTH THREE TIMES A DAY 270 tablet 2  . cyanocobalamin ([employer] VITAMIN B12) 1000 MCG tablet  Take 1 tablet (1,000 mcg total) by mouth daily. 90 tablet 1  . ferrous sulfate 325 (65 FE) MG tablet TAKE 1 TABLET (325 MG TOTAL) BY MOUTH 2 (TWO) TIMES DAILY WITH A MEAL. 180 tablet 0  . megestrol (MEGACE) 40 MG tablet Take 1 tablet (40 mg total) by mouth 2 (two) times daily. 30 tablet 1  . Multiple Vitamins-Minerals (MULTIVITAMIN ADULT) TABS Take 1 tablet by mouth daily.    Marland Kitchen gabapentin (NEURONTIN) 100 MG capsule Take 2 capsules (200 mg total) by mouth at bedtime. (Patient not taking: Reported on 06/09/2018) 60 capsule 3  . traMADol (ULTRAM) 50 MG tablet Take 1 tablet (50 mg total) by mouth every 6 (six) hours as needed. (Patient not taking: Reported on 06/20/2019) 20 tablet 0   No current facility-administered medications for this visit.     Allergies Amoxicillin and Morphine and related   Physical Exam:  BP (!) 157/98   Pulse 72   Wt 163 lb (73.9 kg)   LMP 06/20/2019   BMI 28.42 kg/m  Body mass index is 28.42 kg/m. General appearance: Well nourished, well developed female in no acute distress.  Neck:  Supple, normal appearance, and no thyromegaly  Cardiovascular: normal s1 and s2.  No murmurs, rubs or gallops. Respiratory:  Clear to auscultation bilateral. Normal respiratory effort Abdomen: positive bowel sounds and no masses, hernias; diffusely non tender to palpation, non distended Neuro/Psych:  Normal mood and affect.  Skin:  Warm and dry.  Lymphatic:  No inguinal lymphadenopathy.   Pelvic exam: is not limited by body habitus EGBUS: within normal limits, Vagina: within normal limits and with scant old blood but no discharge in the vault, Cervix: normal appearing cervix without tenderness, discharge or lesions. Uterus:  nonenlarged and non tender and Adnexa:  normal adnexa and no mass, fullness, tenderness Rectovaginal: deferred  Laboratory: UPT negative CBC Latest Ref Rng & Units 05/13/2019 05/12/2019 05/11/2019  WBC 4.0 - 10.5 K/uL 11.5(H) 14.3(H) 19.9(H)  Hemoglobin 12.0 -  15.0 g/dL 10.3(L) 11.1(L) 10.9(L)  Hematocrit 36.0 - 46.0 % 33.4(L) 34.2(L) 33.9(L)  Platelets 150 - 400 K/uL 215 236 253   CMP Latest Ref Rng & Units 05/13/2019 05/12/2019 05/11/2019  Glucose 70 - 99 mg/dL 91 105(H) 115(H)  BUN 6 - 20 mg/dL 6 5(L) 6  Creatinine 0.44 -  1.00 mg/dL 0.63 0.75 0.66  Sodium 135 - 145 mmol/L 138 136 134(L)  Potassium 3.5 - 5.1 mmol/L 4.1 3.4(L) 3.1(L)  Chloride 98 - 111 mmol/L 103 98 99  CO2 22 - 32 mmol/L 24 23 22   Calcium 8.9 - 10.3 mg/dL 8.8(L) 8.9 9.4  Total Protein 6.5 - 8.1 g/dL 6.8 7.2 7.9  Total Bilirubin 0.3 - 1.2 mg/dL 0.4 0.6 0.6  Alkaline Phos 38 - 126 U/L 63 63 74  AST 15 - 41 U/L 47(H) 49(H) 65(H)  ALT 0 - 44 U/L 57(H) 70(H) 86(H)    Radiology:  CLINICAL DATA:  Vaginal bleeding for 8 weeks.  EXAM: TRANSABDOMINAL AND TRANSVAGINAL ULTRASOUND OF PELVIS  TECHNIQUE: Both transabdominal and transvaginal ultrasound examinations of the pelvis were performed. Transabdominal technique was performed for global imaging of the pelvis including uterus, ovaries, adnexal regions, and pelvic cul-de-sac. It was necessary to proceed with endovaginal exam following the transabdominal exam to visualize the uterus, endometrium and ovaries to better advantage.  COMPARISON:  None  FINDINGS: Uterus  Measurements: 11.5 x 7.8 x 8.0 cm = volume: 375 mL. No masses. Hypervascular on color Doppler analysis. Heterogeneous echogenicity. Several small cystic spaces noted mostly along the junctional zone.  Endometrium  Thickness: 1.9 cm.  No focal abnormality visualized.  Right ovary  Measurements: 3.9 x 2.9 x 2.3 cm = volume: 13.8 mL. Normal appearance/no adnexal mass.  Left ovary  Not visualized.  Other findings  No abnormal free fluid.  IMPRESSION: 1. Endometrium is thickened to 1.9 cm. No endometrial mass. Heterogeneous appearance of the uterus with small cystic spaces, but no defined mass. If bleeding remains unresponsive to hormonal  or medical therapy, focal lesion work-up with sonohysterogram should be considered. Endometrial biopsy should also be considered in pre-menopausal patients at high risk for endometrial carcinoma. (Ref: Radiological Reasoning: Algorithmic Workup of Abnormal Vaginal Bleeding with Endovaginal Sonography and Sonohysterography. AJR 2008; [id number]) 2. No other abnormality.  Left ovary not visualized.   Electronically Signed   By: Lajean Manes M.D.   On: 03/18/2019 05:56   CLINICAL DATA:  Acute generalized abdominal pain with fever, dysmenorrhea  EXAM: CT ABDOMEN AND PELVIS WITH CONTRAST  TECHNIQUE: Multidetector CT imaging of the abdomen and pelvis was performed using the standard protocol following bolus administration of intravenous contrast.  CONTRAST:  182mL OMNIPAQUE IOHEXOL 300 MG/ML  SOLN  COMPARISON:  CT abdomen pelvis 06/02/2013  FINDINGS: Lower chest: Lung bases are clear. Normal heart size. No pericardial effusion.  Hepatobiliary: No focal liver abnormality is seen. No gallstones, gallbladder wall thickening, or biliary dilatation.  Pancreas: Unremarkable. No pancreatic ductal dilatation or surrounding inflammatory changes.  Spleen: Normal in size without focal abnormality.  Adrenals/Urinary Tract: Normal adrenal glands. No concerning renal mass. There is mild prominence of the bilateral renal collecting systems with slight urothelial thickening. A right extrarenal pelvis is similar to prior. No obstructing urolithiasis. There is circumferential bladder wall thickening with perivesicular haze.  Stomach/Bowel: Small sliding-type hiatal hernia. Duodenal sweep is normal orientation. No small bowel dilatation or wall thickening. A normal appendix is visualized. Mild intramural fat of the cecum and ascending colon, nonspecific. No colonic dilatation or wall thickening.  Vascular/Lymphatic: The aorta is normal caliber. There is  clustered retroperitoneal adenopathy with several borderline enlarged lymph nodes present in the periaortic, aortocaval and inguinal lymphatic changes. Example lymph nodes include a 17 mm lymph node in the left iliac chain with some central hypoattenuation suggesting necrosis (2/67) a similar-appearing right iliac node measures  11.6 cm (2/64).  Reproductive: There is marked enlargement of the uterus with extensive myometrial thickening and scattered intramural punctate hypoattenuating foci. The endometrial canal as a relatively bland appearance. There is fluid in the endocervical canal and within the vaginal vault. Small amount of fluid is present along the broad ligament with venous engorgement of the parametrial vessels. Few normal follicles are present in the right ovary.  Other: No abdominopelvic free fluid or free gas. No bowel containing hernias.  Musculoskeletal: Multilevel degenerative changes are present in the imaged portions of the spine.  IMPRESSION: 1. Marked enlargement of the uterus with extensive myometrial thickening and scattered intramural punctate hypoattenuating foci. Findings could reflect a benign process such as adenomyosis though underlying malignancy is not fully excluded given concurrent adenopathy as below. Recommend further evaluation with pelvic ultrasound as clinically indicated. 2. Retroperitoneal and pelvic lymphadenopathy, concerning for metastatic disease versus lymphoproliferative disorder. 3. Circumferential bladder wall thickening with perivesicular haze, suggestive of cystitis. Mild prominence of the bilateral renal collecting systems with slight urothelial thickening, which could reflect ascending urinary tract infection. Correlate with urinalysis. 4. Small amount of fluid along the broad ligament with venous engorgement of the parametrial vessels, which can be seen with pelvic congestion syndrome in the appropriate clinical  setting. 5. Mild intramural fat of the cecum and ascending colon, nonspecific, but can be seen in the setting of remote inflammatory bowel disease or secondary to body habitus. Correlate with history. 6. Small sliding-type hiatal hernia.   Electronically Signed   By: Lovena Le M.D.   On: 05/12/2019 00:46  Assessment: pt stable  Plan:  1. Abnormal uterine bleeding (AUB) Follow up pap and embx  Message sent to radiology re: uterine size comparision vs u/s earlier this year and 2019 CT.   D/w her re: possible endometrial ablation given her age and good success rate and efficacy carrying her through menopause.   Mammogram ordered - Cytology - PAP( [place]) - MM 3D SCREEN BREAST BILATERAL; Future - POCT urine pregnancy - Surgical pathology( [place]/ [facility])  Orders Placed This Encounter  Procedures  . MM 3D SCREEN BREAST BILATERAL  . POCT urine pregnancy    RTC after results  Durene Romans MD Attending Center for [facility])

## 2019-06-20 NOTE — Procedures (Signed)
Endometrial Biopsy Procedure Note  Pre-operative Diagnosis: AUB  Post-operative Diagnosis: same  Procedure Details  Urine pregnancy test was done and result was negative.  The risks (including infection, bleeding, pain, and uterine perforation) and benefits of the procedure were explained to the patient and Written informed consent was obtained.  The patient was placed in the dorsal lithotomy position.  Bimanual exam showed the uterus to be in the neutral position.  A Graves' speculum inserted in the vagina, and the cervix was visualized and a pap smear performed. The cervix was then prepped with povidone iodine, and a sharp tenaculum was applied to the anterior lip of the cervix for stabilization.  A pipelle was inserted into the uterine cavity and sounded the uterus to a depth of 9.5cm.  A Small amount of tissue was collected after 3 passes mixed with large blood. The sample was sent for pathologic examination.  Condition: Stable  Complications: None  Plan: The patient was advised to call for any fever or for prolonged or severe pain or bleeding. She was advised to use OTC analgesics as needed for mild to moderate pain. She was advised to avoid vaginal intercourse for 48 hours or until the bleeding has completely stopped.  Durene Romans MD Attending Center for [facility])

## 2019-06-22 LAB — SURGICAL PATHOLOGY

## 2019-06-23 ENCOUNTER — Other Ambulatory Visit: Payer: Self-pay

## 2019-06-23 ENCOUNTER — Ambulatory Visit (INDEPENDENT_AMBULATORY_CARE_PROVIDER_SITE_OTHER): Payer: No Typology Code available for payment source | Admitting: Family Medicine

## 2019-06-23 VITALS — BP 142/78 | HR 105 | Temp 98.1°F | Resp 16 | Ht 63.0 in | Wt 162.8 lb

## 2019-06-23 DIAGNOSIS — Z23 Encounter for immunization: Secondary | ICD-10-CM | POA: Diagnosis not present

## 2019-06-23 DIAGNOSIS — D649 Anemia, unspecified: Secondary | ICD-10-CM | POA: Diagnosis not present

## 2019-06-23 DIAGNOSIS — I10 Essential (primary) hypertension: Secondary | ICD-10-CM

## 2019-06-23 DIAGNOSIS — F411 Generalized anxiety disorder: Secondary | ICD-10-CM | POA: Diagnosis not present

## 2019-06-23 MED ORDER — AMLODIPINE BESYLATE 10 MG PO TABS: 10.0000 mg | ORAL_TABLET | Freq: Every day | ORAL | 0 refills | Status: DC

## 2019-06-23 MED ORDER — BUSPIRONE HCL 10 MG PO TABS: 10.0000 mg | ORAL_TABLET | Freq: Three times a day (TID) | ORAL | 2 refills | Status: DC

## 2019-06-23 NOTE — Progress Notes (Signed)
Subjective:    Patient ID: Judith Drake, female    DOB: [birth year], 48 y.o.   MRN: [id number]  HPI   Patient presents to clinic for follow-up on anemia related to dysfunctional uterine bleeding.  She does have procedure scheduled for dilation and curettage end of this month.  Patient also following up on blood pressure and anxiety.  Blood pressure well controlled on current amlodipine and does require refill.  Also requesting refill for BuSpar.  Feels this medication does work well to control her anxiety and mood and tolerates without any problems.  Denies any SI or HI.  Patient Active Problem List   Diagnosis Date Noted  . SIRS (systemic inflammatory response syndrome) ([place]) 05/12/2019  . Acute lower UTI 05/12/2019  . Acute cystitis with hematuria   . Abnormal uterine bleeding (AUB) 03/20/2019  . Dysmenorrhea 03/20/2019  . Hypomagnesemia   . Laceration of scalp 04/20/2018  . Acute respiratory failure with hypoxia ([place]) 04/10/2018  . Nonallopathic lesion of sacral region 03/28/2018  . Nonallopathic lesion of thoracic region 03/28/2018  . Nonallopathic lesion of lumbosacral region 03/28/2018  . Nonallopathic lesion of pelvic region 03/28/2018  . Nonallopathic lesion of cervical region 03/28/2018  . Enlarged thyroid 03/07/2018  . 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 Lumbar radiculopathy, right 02/08/2018  . GAD (generalized anxiety disorder) 01/03/2018  . Rash 01/03/2018  . Right hip pain 01/03/2018  . GERD (gastroesophageal reflux disease) 12/13/2017  . Menorrhagia 12/13/2017  . Hypokalemia 12/13/2017  . Fibroid uterus 12/13/2017  . Elevated liver enzymes 11/24/2013  . Depression 11/24/2013  . Fatigue 07/12/2013  . Abnormal laboratory test 07/12/2013  . Symptomatic anemia 07/12/2013  . HTN (hypertension) 07/12/2013  . Alcohol abuse 07/12/2013  . Tobacco abuse 07/12/2013  . Yeast infection of the skin 07/12/2013  . Routine general medical examination  at a health care facility 07/12/2013   Social History   Tobacco Use  . Smoking status: Light Tobacco Smoker    Types: Cigarettes  . Smokeless tobacco: Current User  Substance Use Topics  . Alcohol use: Yes   Review of Systems   Constitutional: Negative for chills, fatigue and fever.  HENT: Negative for congestion, ear pain, sinus pain and sore throat.   Eyes: Negative.   Respiratory: Negative for cough, shortness of breath and wheezing.   Cardiovascular: Negative for chest pain, palpitations and leg swelling.  Gastrointestinal: Negative for abdominal pain, diarrhea, nausea and vomiting.  Genitourinary: Negative for dysuria, frequency and urgency.  Musculoskeletal: Negative for arthralgias and myalgias.  Skin: Negative for color change, pallor and rash.  Neurological: Negative for syncope, light-headedness and headaches.  Psychiatric/Behavioral: The patient is not nervous/anxious.       Objective:   Physical Exam Vitals signs and nursing note reviewed.  Constitutional:      General: She is not in acute distress.    Appearance: She is not toxic-appearing.  HENT:     Head: Normocephalic and atraumatic.  Eyes:     General: No scleral icterus.    Extraocular Movements: Extraocular movements intact.     Conjunctiva/sclera: Conjunctivae normal.  Cardiovascular:     Rate and Rhythm: Normal rate and regular rhythm.     Heart sounds: Normal heart sounds.  Pulmonary:     Effort: Pulmonary effort is normal.     Breath sounds: Normal breath sounds.  Abdominal:     General: Bowel sounds are normal. There is no distension.     Palpations: Abdomen is soft.  Tenderness: There is no abdominal tenderness. There is no guarding.  Musculoskeletal:     Right lower leg: No edema.     Left lower leg: No edema.  Skin:    General: Skin is warm and dry.     Coloration: Skin is not jaundiced or pale.  Neurological:     Mental Status: She is alert and oriented to person, place, and time.      Gait: Gait normal.  Psychiatric:        Mood and Affect: Mood normal.        Behavior: Behavior normal.    Today's Vitals   06/23/19 1509  BP: (!) 142/78  Pulse: (!) 105  Resp: 16  Temp: 98.1 F (36.7 C)  TempSrc: Temporal  SpO2: 98%  Weight: 162 lb 12.8 oz (73.8 kg)  Height: 5\' 3"  (1.6 m)   Body mass index is 28.84 kg/m.     Assessment & Plan:    Anemia related to uterine bleeding-we will recheck lab work to make sure her levels remained stable.  Encouraged to keep follow-up with OB/GYN for future treatments and plans of the dysfunctional uterine bleeding.  Hypertension-hypertension well-controlled amlodipine, will refill.  Anxiety-BuSpar refill.  Patient tolerating this medication very well without any problems.  Flu vaccine given in clinic today

## 2019-06-24 LAB — CBC
HCT: 38.5 % (ref 35.0–45.0)
Hemoglobin: 12.8 g/dL (ref 11.7–15.5)
MCH: 28.9 pg (ref 27.0–33.0)
MCHC: 33.2 g/dL (ref 32.0–36.0)
MCV: 86.9 fL (ref 80.0–100.0)
MPV: 10.6 fL (ref 7.5–12.5)
Platelets: 387 10*3/uL (ref 140–400)
RBC: 4.43 10*6/uL (ref 3.80–5.10)
RDW: 15.4 % — ABNORMAL HIGH (ref 11.0–15.0)
WBC: 7.7 10*3/uL (ref 3.8–10.8)

## 2019-06-24 LAB — COMPREHENSIVE METABOLIC PANEL
AG Ratio: 1.6 (calc) (ref 1.0–2.5)
ALT: 23 U/L (ref 6–29)
AST: 25 U/L (ref 10–35)
Albumin: 4.8 g/dL (ref 3.6–5.1)
Alkaline phosphatase (APISO): 56 U/L (ref 31–125)
BUN: 8 mg/dL (ref 7–25)
CO2: 22 mmol/L (ref 20–32)
Calcium: 10.2 mg/dL (ref 8.6–10.2)
Chloride: 100 mmol/L (ref 98–110)
Creat: 0.61 mg/dL (ref 0.50–1.10)
Globulin: 3 g/dL (calc) (ref 1.9–3.7)
Glucose, Bld: 102 mg/dL — ABNORMAL HIGH (ref 65–99)
Potassium: 3.6 mmol/L (ref 3.5–5.3)
Sodium: 138 mmol/L (ref 135–146)
Total Bilirubin: 0.8 mg/dL (ref 0.2–1.2)
Total Protein: 7.8 g/dL (ref 6.1–8.1)

## 2019-06-26 LAB — CYTOLOGY - PAP

## 2019-06-27 ENCOUNTER — Telehealth: Payer: Self-pay | Admitting: Radiology

## 2019-06-27 NOTE — Telephone Encounter (Signed)
Left message for patient to call [facility] to inform her that [employer] does not cover the procedure that she is having done on 07/05/19. Patient will be self pay.

## 2019-06-30 ENCOUNTER — Telehealth: Payer: Self-pay | Admitting: Obstetrics and Gynecology

## 2019-06-30 DIAGNOSIS — R591 Generalized enlarged lymph nodes: Secondary | ICD-10-CM

## 2019-06-30 NOTE — Telephone Encounter (Signed)
GYN Telephone Note Patient called at [phone] and another VM left requesting patient call me back at the clinic.  Durene Romans MD Attending Center for [facility] (Faculty Practice) 06/30/2019 Time: 1031am

## 2019-06-30 NOTE — Telephone Encounter (Signed)
GYN Telephone Note  Patient called back to clinic and I d/w her re: surgery. I told her I recommend seeing Gyn Onc to go over her CT scan from last month to make sure there's no additional work up that needs to be done to rule out malignancy. She states that she got the message from clinic re: her surgery not covered by her insurance and approximate cost would be $6,000 and would need to cancel the surgery anyways until she likely gets better insurance later this year.   I also told her that she needs another pap smear but that this could be done at her gyn onc referral.   Gyn onc referral placed.   Durene Romans MD Attending Center for [facility] (Faculty Practice) 06/30/2019 Time: 1121am

## 2019-07-01 ENCOUNTER — Other Ambulatory Visit (HOSPITAL_COMMUNITY): Payer: No Typology Code available for payment source

## 2019-07-05 ENCOUNTER — Encounter (HOSPITAL_BASED_OUTPATIENT_CLINIC_OR_DEPARTMENT_OTHER): Admission: RE | Payer: Self-pay | Source: Home / Self Care

## 2019-07-05 ENCOUNTER — Ambulatory Visit (HOSPITAL_BASED_OUTPATIENT_CLINIC_OR_DEPARTMENT_OTHER)
Admission: RE | Admit: 2019-07-05 | Payer: No Typology Code available for payment source | Source: Home / Self Care | Admitting: Obstetrics and Gynecology

## 2019-07-05 SURGERY — DILATATION & CURETTAGE/HYSTEROSCOPY WITH NOVASURE ABLATION
Anesthesia: Choice

## 2019-07-07 ENCOUNTER — Telehealth: Payer: Self-pay | Admitting: *Deleted

## 2019-07-07 NOTE — Telephone Encounter (Signed)
Called and left the patient a message to call the office back. Patient needs to be scheduled for a new patient appt  °

## 2019-07-10 ENCOUNTER — Telehealth: Payer: Self-pay | Admitting: *Deleted

## 2019-07-10 NOTE — Telephone Encounter (Signed)
Patient called and scheduled an appt for 11/10. Gave the address and phone number; also the policy for no visitors, mask and parking

## 2019-07-11 ENCOUNTER — Other Ambulatory Visit: Payer: Self-pay | Admitting: Family Medicine

## 2019-07-11 DIAGNOSIS — I10 Essential (primary) hypertension: Secondary | ICD-10-CM

## 2019-07-13 NOTE — Progress Notes (Signed)
Consult Note: Gyn-Onc New Patient Consultation  Judith Drake 48 y.o. female  CC:  Chief Complaint  Patient presents with  . Abnormal uterine bleeding (AUB)  . Retroperitoneal lymphadenopathy    HPI: Patient is seen in consultation today at the request of Dr. Ilda Basset.  Patient is a 48 year old G2P2 has been seen numerous times in the emergency room for for a variety of issues however starting this past summer has been seen for significant vaginal bleeding requiring a transfusion.  Each of these time she was treated with IV Premarin as well as Megace.  We do not have any sampling from these episodes.  However, ultrasound at that time revealed the uterus to measure 11.5 x 7.8 x 8.0 cm with a 1.9 cm endometrial stripe thickness.  She again presented to the emergency room where she was found to be febrile with a systemic inflammatory response and was admitted for probable cystitis with an ascending infection.  Imaging at that time revealed:  CT 9/20: IMPRESSION: 1. Marked enlargement of the uterus with extensive myometrial thickening and scattered intramural punctate hypoattenuating foci. Findings could reflect a benign process such as adenomyosis though underlying malignancy is not fully excluded given concurrent adenopathy as below. There is clustered retroperitoneal adenopathy with several borderline enlarged lymph nodes present in the periaortic, aortocaval and inguinal lymphatic changes. Example lymph nodes include a 17 mm lymph node in the left iliac chain with some central hypoattenuation suggesting necrosis (2/67) a similar-appearing right iliac node measures 11.6 cm (2/64). (presume mm) 2. Retroperitoneal and pelvic lymphadenopathy, concerning for metastatic disease versus lymphoproliferative disorder. 3. Circumferential bladder wall thickening with perivesicular haze, suggestive of cystitis. Mild prominence of the bilateral renal collecting systems with slight urothelial thickening,  which could reflect ascending urinary tract infection. Correlate with urinalysis. 4. Small amount of fluid along the broad ligament with venous engorgement of the parametrial vessels, which can be seen with pelvic congestion syndrome in the appropriate clinical setting. 5. Mild intramural fat of the cecum and ascending colon, nonspecific, but can be seen in the setting of remote inflammatory bowel disease or secondary to body habitus. Correlate with history. 6. Small sliding-type hiatal hernia.  She subsequent underwent an endometrial biopsy that revealed: 10/13: embx: DIAGNOSIS:  A. ENDOMETRIUM, BIOPSY:  - Polypoid inactive endometrium with hormone-effect  - Negative for hyperplasia or malignancy   Pap smear was performed that was insufficient based on scant cellularity.  Her history is fairly complicated vertically with regards to the bleeding.  Most recently in June she was in the emergency room for bleeding and had 2 unit packed red blood cells.  She started on IV Premarin and Megace.  Since starting on the Megace her bleeding has decreased significantly.  She was scheduled for an endometrial ablation but numerous issues made her have to forego that at the time.  Concomitant with all of this she is having some night sweats for about 6 months as well as some hot flashes with increased mood lability and being more emotional.  She is not sure if is related to the Megace or being perimenopausal.  She is under a fair amount of stress at home.  She is an 48 year old who is severely autistic and she is his primary caretaker and also has a 47 year old who is trying to homeschool during the pandemic.  She has no significant weight changes.  She is never had an abnormal Pap smear.  Her most recent Pap smear revealed scant cellularity so we need to  repeat one today.  She does have about 3-4 drinks 3-4 times a week.  She smokes 1/2 to 1 pack/day she is done for 30 years.  She denies use of drugs.  Her review  of systems is essentially otherwise unremarkable.  Current Meds:  Outpatient Encounter Medications as of 07/18/2019  Medication Sig  . amLODipine (NORVASC) 10 MG tablet TAKE 1 TABLET BY MOUTH ONCE DAILY  . busPIRone (BUSPAR) 10 MG tablet Take 1 tablet (10 mg total) by mouth 3 (three) times daily.  . megestrol (MEGACE) 40 MG tablet Take 1 tablet (40 mg total) by mouth 2 (two) times daily.  . Multiple Vitamins-Minerals (MULTIVITAMIN ADULT) TABS Take 1 tablet by mouth daily.  . traMADol (ULTRAM) 50 MG tablet Take 1 tablet (50 mg total) by mouth every 6 (six) hours as needed.  . gabapentin (NEURONTIN) 100 MG capsule Take 2 capsules (200 mg total) by mouth at bedtime. (Patient not taking: Reported on 07/18/2019)  . [DISCONTINUED] cyanocobalamin ([employer] VITAMIN B12) 1000 MCG tablet Take 1 tablet (1,000 mcg total) by mouth daily.  . [DISCONTINUED] ferrous sulfate 325 (65 FE) MG tablet TAKE 1 TABLET (325 MG TOTAL) BY MOUTH 2 (TWO) TIMES DAILY WITH A MEAL.   No facility-administered encounter medications on file as of 07/18/2019.     Allergy:  Allergies  Allergen Reactions  . Amoxicillin Nausea And Vomiting    Has patient had a PCN reaction causing immediate rash, facial/tongue/throat swelling, SOB or lightheadedness with hypotension: No Has patient had a PCN reaction causing severe rash involving mucus membranes or skin necrosis: No Has patient had a PCN reaction that required hospitalization: No Has patient had a PCN reaction occurring within the last 10 years: No If all of the above answers are "NO", then may proceed with Cephalosporin use.   Marland Kitchen Morphine And Related Itching    Social Hx:   Social History   Socioeconomic History  . Marital status: Married    Spouse name: Lanny Hurst  . Number of children: 2  . Years of education: 37  . Highest education level: Not on file  Occupational History  . Occupation: Agricultural engineer   Social Needs  . Financial resource strain: Not on file  . Food  insecurity    Worry: Not on file    Inability: Not on file  . Transportation needs    Medical: Not on file    Non-medical: Not on file  Tobacco Use  . Smoking status: Light Tobacco Smoker    Types: Cigarettes  . Smokeless tobacco: Current User  Substance and Sexual Activity  . Alcohol use: Yes  . Drug use: No  . Sexual activity: Yes    Partners: Male    Birth control/protection: Surgical  Lifestyle  . Physical activity    Days per week: Not on file    Minutes per session: Not on file  . Stress: Not on file  Relationships  . Social Herbalist on phone: Not on file    Gets together: Not on file    Attends religious service: Not on file    Active member of club or organization: Not on file    Attends meetings of clubs or organizations: Not on file    Relationship status: Not on file  . Intimate partner violence    Fear of current or ex partner: Not on file    Emotionally abused: Not on file    Physically abused: Not on file    Forced sexual  activity: Not on file  Other Topics Concern  . Not on file  Social History Narrative   Solmayra grew up in [place]. She attended [employer] for a certificate in accounting. She currently lives a home with her husband and 2 boys. Her oldest son has autism. She enjoys going out with her family.     Past Surgical Hx:  Past Surgical History:  Procedure Laterality Date  . ELBOW SURGERY     1997   . TUBAL LIGATION      Past Medical Hx:  Past Medical History:  Diagnosis Date  . Anemia   . ETOH abuse   . Fibroid   . GERD (gastroesophageal reflux disease)   . Heavy menstrual period   . Hypertension     Oncology Hx:  Oncology History   No history exists.    Family Hx:  Family History  Problem Relation Age of Onset  . Cancer Mother 38       Lung   . Hyperlipidemia Mother   . Hypertension Mother   . Stroke Mother   . Kidney disease Mother   . Diabetes Mother   . Heart disease Mother   .  Hyperlipidemia Father   . Autism Son   . Cancer Paternal Grandfather 8       Colon Cancer - died  . Breast cancer Neg Hx     Vitals:  Blood pressure 133/82, pulse 82, temperature 98.9 F (37.2 C), temperature source Temporal, resp. rate 20, height 5\' 3"  (1.6 m), weight 162 lb (73.5 kg), last menstrual period 06/20/2019, SpO2 100 %.  Physical Exam: Well-nourished well-developed female in no acute distress.  Neck: Supple, no lymphadenopathy, no thyromegaly.  Abdomen: Soft, nontender, nondistended.  He can appreciate the fundus of the uterus just above the pubic symphysis.  There is no fluid wave.  There is no rebound or guarding or hepatosplenomegaly.  Groins: No lymphadenopathy.  Extremities: No edema.  Pelvic.  External genitalia within normal limits.  Speculum examination reveals a multiparous cervix.  There is some menstrual flow coming from the os.  There are no gross visible lesions.  Pap smear was submitted without difficulty.  Bimanual examination reveals an approximately 10 to 12-week size uterus is slightly globular.  It is freely mobile.  There are no definitive adnexal masses.  Assessment/Plan: 48 year old with irregular bleeding potentially from adenomyosis as well as perimenopausal changes.  Endometrial biopsy was negative.  However, on imaging in the emergency room she was noted to have fairly significant adenopathy.  This was in the setting of an acute infection and SIRS.  We did speak to vascular interventional radiology and they did not feel that they would easily be able to access any of these lymph nodes.  She has some night sweats but no significant weight changes that would indicate a lymphoma.  However, I think before moving forward with any other definitive surgery would be important to understand if these lymph nodes are pathologic.  Her last imaging was 2 months ago.  She very much would like to consider definitive hysterectomy.  She did consider an endometrial  ablation because she was worried about childcare for her son.  However she does believe that she will be able to arrange childcare through her age agency and would like to proceed with hysterectomy if possible.  1. We we will get a repeat CT scan of the abdomen pelvis to evaluate the adenopathy.  If the adenopathy is bigger I think we will  need to consider some other image guided biopsy to evaluate the etiology.  If the adenopathy is resolved we can most likely believe it was related to the acute infection that she had when she was in the emergency room. 2. I we will follow-up in the results of her Pap smear. 3. She would like to consider definitive surgery with a hysterectomy.  If the imaging is reassuring, I think we could discuss moving forward with a hysterectomy in a minimally invasive fashion with bilateral salpingectomy.  I would send the uterus for frozen despite the negative biopsy such that if there was any evidence of malignancy we could proceed with appropriate staging and/or consideration of oophorectomy.  Her questions were elicited and answered to her satisfaction.  We appreciate the opportunity to part in the care of this very pleasant patient.  Ailah Barna A., MD 07/18/2019, 2:31 PM

## 2019-07-18 ENCOUNTER — Other Ambulatory Visit (HOSPITAL_COMMUNITY)
Admission: RE | Admit: 2019-07-18 | Discharge: 2019-07-18 | Disposition: A | Discharge status: Home / Self Care | Payer: No Typology Code available for payment source | Source: Ambulatory Visit | Attending: Gynecologic Oncology | Admitting: Gynecologic Oncology

## 2019-07-18 ENCOUNTER — Telehealth: Payer: Self-pay

## 2019-07-18 ENCOUNTER — Other Ambulatory Visit: Payer: Self-pay

## 2019-07-18 ENCOUNTER — Encounter: Payer: Self-pay | Admitting: Gynecologic Oncology

## 2019-07-18 ENCOUNTER — Inpatient Hospital Stay: Payer: No Typology Code available for payment source | Attending: Gynecologic Oncology | Admitting: Gynecologic Oncology

## 2019-07-18 VITALS — BP 133/82 | HR 82 | Temp 98.9°F | Resp 20 | Ht 63.0 in | Wt 162.0 lb

## 2019-07-18 DIAGNOSIS — R9389 Abnormal findings on diagnostic imaging of other specified body structures: Secondary | ICD-10-CM | POA: Diagnosis not present

## 2019-07-18 DIAGNOSIS — R590 Localized enlarged lymph nodes: Secondary | ICD-10-CM | POA: Insufficient documentation

## 2019-07-18 DIAGNOSIS — F17210 Nicotine dependence, cigarettes, uncomplicated: Secondary | ICD-10-CM | POA: Insufficient documentation

## 2019-07-18 DIAGNOSIS — N939 Abnormal uterine and vaginal bleeding, unspecified: Secondary | ICD-10-CM | POA: Insufficient documentation

## 2019-07-18 DIAGNOSIS — D259 Leiomyoma of uterus, unspecified: Secondary | ICD-10-CM | POA: Diagnosis not present

## 2019-07-18 DIAGNOSIS — R599 Enlarged lymph nodes, unspecified: Secondary | ICD-10-CM | POA: Insufficient documentation

## 2019-07-18 DIAGNOSIS — Z79899 Other long term (current) drug therapy: Secondary | ICD-10-CM | POA: Insufficient documentation

## 2019-07-18 DIAGNOSIS — K219 Gastro-esophageal reflux disease without esophagitis: Secondary | ICD-10-CM | POA: Insufficient documentation

## 2019-07-18 DIAGNOSIS — I10 Essential (primary) hypertension: Secondary | ICD-10-CM | POA: Diagnosis not present

## 2019-07-18 NOTE — Patient Instructions (Signed)
It was very nice to meet you today.  We will notify you of the results of your Pap smear as well as the CT scan once we have those results.  Once we have the results of the CT scan, we can discuss next steps.

## 2019-07-18 NOTE — Telephone Encounter (Signed)
I spoke with Marzetta Board from [facility].  CT scan from 05/12/2019 "... notes right iliac node measures 11.6cm..."  Dr. Denman George requests this be reviewed and addend.  Marzetta Board stated that if this measurement was accurate she would phone [place].

## 2019-07-24 ENCOUNTER — Telehealth: Payer: Self-pay | Admitting: Family

## 2019-07-24 ENCOUNTER — Other Ambulatory Visit: Payer: Self-pay

## 2019-07-24 ENCOUNTER — Telehealth: Payer: Self-pay | Admitting: *Deleted

## 2019-07-24 DIAGNOSIS — I10 Essential (primary) hypertension: Secondary | ICD-10-CM

## 2019-07-24 LAB — CYTOLOGY - PAP
Comment: NEGATIVE
Diagnosis: UNDETERMINED — AB
High risk HPV: NEGATIVE

## 2019-07-24 MED ORDER — AMLODIPINE BESYLATE 10 MG PO TABS: 10.0000 mg | ORAL_TABLET | Freq: Every day | ORAL | 5 refills | Status: DC

## 2019-07-24 NOTE — Telephone Encounter (Signed)
Patient called and stated "My insurance will not cover the CT scan, I had to cancel the appt. I will reschedule the appt once I have better insurance. I'm in the process of that now." Melissa APP and Dr. Alycia Rossetti notified

## 2019-07-24 NOTE — Telephone Encounter (Signed)
Copied from [place] [id number]. Topic: General - Other >> Jul 24, 2019  2:11 PM Keene Breath wrote: Reason for CRM: Patient called to ask if she can get her normal 30 day supply of her medication, amLODipine (NORVASC) 10 MG tablet.  She stated that the pharmacy has only be filling it for 15 days, and she would like to go back to the normal script.  Please advise and call to discuss at [phone]

## 2019-07-24 NOTE — Telephone Encounter (Signed)
Since patient was seen by Philis Nettle, FNP on 10/16 I have refilled #30 with 5 refills.

## 2019-07-25 ENCOUNTER — Ambulatory Visit (HOSPITAL_COMMUNITY): Admission: RE | Admit: 2019-07-25 | Payer: Self-pay | Source: Ambulatory Visit

## 2019-07-25 NOTE — Telephone Encounter (Signed)
Told Judith Drake that Dr. Alycia Rossetti wanted her to know that the CT Scan isthe recommendation to follow up on the enlarged lymph nodes to evaluate for possible cancer.  If a cancer is determined at a later time it would be due to not having scan now. Pt well aware of this but she can not cover the expense of the CT scan at this time.She is working on [employer] that that would cover more scans and other procedures and treatments.

## 2019-07-25 NOTE — Telephone Encounter (Signed)
LM for Ms Russi to call back to discuss the cancellation of the CT scan for 07-25-19

## 2019-08-22 ENCOUNTER — Other Ambulatory Visit: Payer: Self-pay

## 2019-08-22 MED ORDER — MEGESTROL ACETATE 40 MG PO TABS: 40.0000 mg | ORAL_TABLET | Freq: Two times a day (BID) | ORAL | 2 refills | Status: DC

## 2019-08-22 NOTE — Telephone Encounter (Signed)
Megestrol 40 mg called in x 2. Patient will need to follow up for any additional refills.

## 2019-09-22 ENCOUNTER — Other Ambulatory Visit: Payer: Self-pay | Admitting: Gynecologic Oncology

## 2019-09-22 DIAGNOSIS — R590 Localized enlarged lymph nodes: Secondary | ICD-10-CM

## 2019-09-25 ENCOUNTER — Other Ambulatory Visit: Payer: Self-pay

## 2019-09-25 ENCOUNTER — Inpatient Hospital Stay: Payer: BLUE CROSS/BLUE SHIELD | Attending: Gynecologic Oncology

## 2019-09-25 DIAGNOSIS — N939 Abnormal uterine and vaginal bleeding, unspecified: Secondary | ICD-10-CM | POA: Diagnosis not present

## 2019-09-25 DIAGNOSIS — R590 Localized enlarged lymph nodes: Secondary | ICD-10-CM | POA: Diagnosis not present

## 2019-09-25 LAB — BASIC METABOLIC PANEL
Anion gap: 14 (ref 5–15)
BUN: 9 mg/dL (ref 6–20)
CO2: 25 mmol/L (ref 22–32)
Calcium: 9.7 mg/dL (ref 8.9–10.3)
Chloride: 100 mmol/L (ref 98–111)
Creatinine, Ser: 0.8 mg/dL (ref 0.44–1.00)
GFR calc Af Amer: >60 mL/min (ref >60)
GFR calc non Af Amer: >60 mL/min (ref >60)
Glucose, Bld: 99 mg/dL (ref 70–99)
Potassium: 3.6 mmol/L (ref 3.5–5.1)
Sodium: 139 mmol/L (ref 135–145)

## 2019-10-03 ENCOUNTER — Other Ambulatory Visit: Payer: Self-pay

## 2019-10-03 ENCOUNTER — Encounter (HOSPITAL_COMMUNITY): Payer: Self-pay

## 2019-10-03 ENCOUNTER — Ambulatory Visit (HOSPITAL_COMMUNITY)
Admission: RE | Admit: 2019-10-03 | Discharge: 2019-10-03 | Disposition: A | Discharge status: Home / Self Care | Payer: BLUE CROSS/BLUE SHIELD | Source: Ambulatory Visit | Attending: Gynecologic Oncology | Admitting: Gynecologic Oncology

## 2019-10-03 DIAGNOSIS — K760 Fatty (change of) liver, not elsewhere classified: Secondary | ICD-10-CM

## 2019-10-03 DIAGNOSIS — R590 Localized enlarged lymph nodes: Secondary | ICD-10-CM

## 2019-10-03 DIAGNOSIS — N939 Abnormal uterine and vaginal bleeding, unspecified: Secondary | ICD-10-CM | POA: Diagnosis not present

## 2019-10-03 DIAGNOSIS — R599 Enlarged lymph nodes, unspecified: Secondary | ICD-10-CM | POA: Insufficient documentation

## 2019-10-03 DIAGNOSIS — K449 Diaphragmatic hernia without obstruction or gangrene: Secondary | ICD-10-CM | POA: Diagnosis not present

## 2019-10-03 HISTORY — DX: Fatty (change of) liver, not elsewhere classified: K76.0

## 2019-10-03 IMAGING — CT CT ABD-PELV W/ CM
2 of 5 series · 16 of 46 positions shown, 18 images · IV contrast (OMNIPAQUE)
Comparison: [DATE]

CLINICAL DATA: Vaginal spotting.

EXAM:
CT ABDOMEN AND PELVIS WITH CONTRAST
TECHNIQUE: Multidetector CT imaging of the abdomen and pelvis was performed
using the standard protocol following bolus administration of
intravenous contrast.
CONTRAST:  100mL OMNIPAQUE IOHEXOL 300 MG/ML  SOLN

[Series 2: axial st · axial · 0.74mm/px · z∈[-437,-17]mm · 13 of 99 slices shown, 15 images]
[im 8/99  soft-tissue]
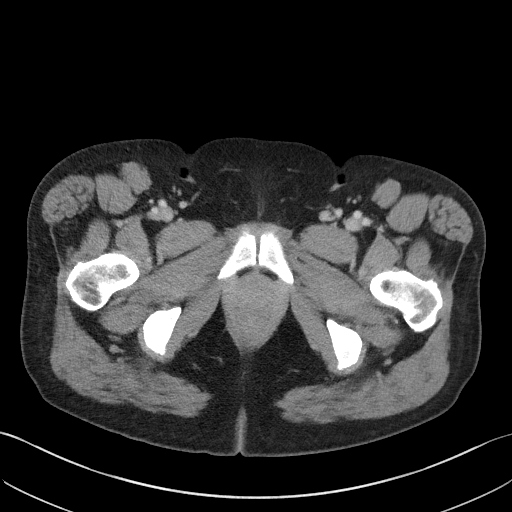
[im 8/99  bone]
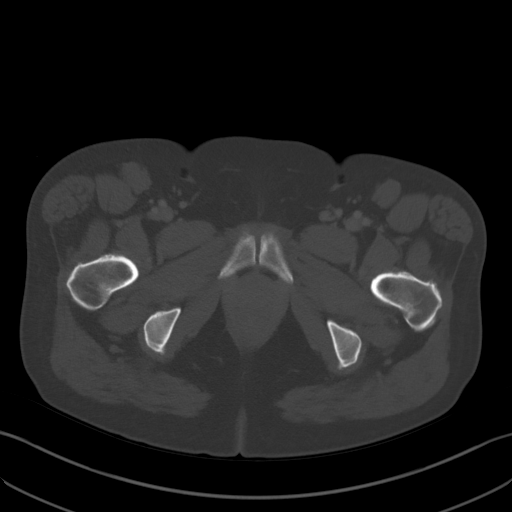
[im 15/99  soft-tissue]
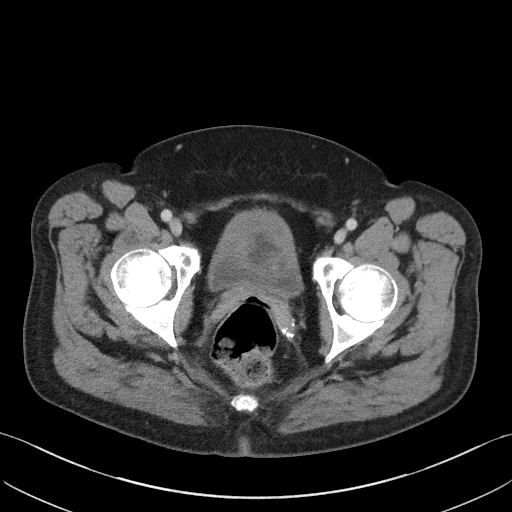
[im 22/99  soft-tissue]
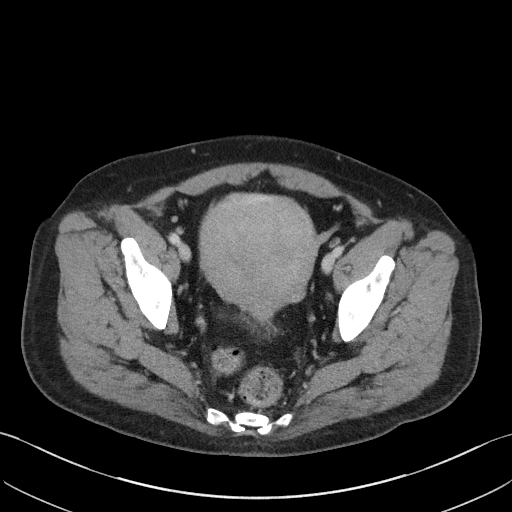
[im 29/99  soft-tissue]
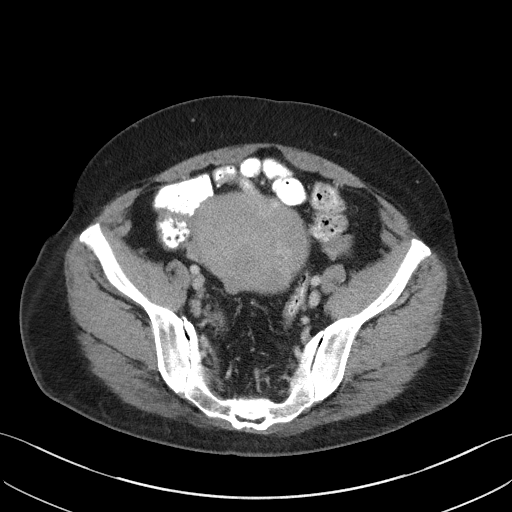
[im 36/99  soft-tissue]
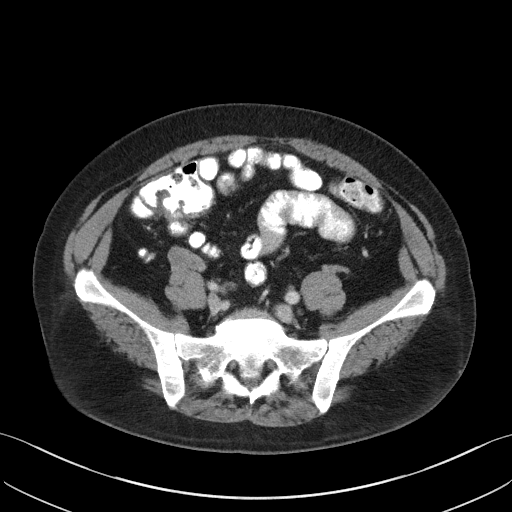
[im 43/99  soft-tissue]
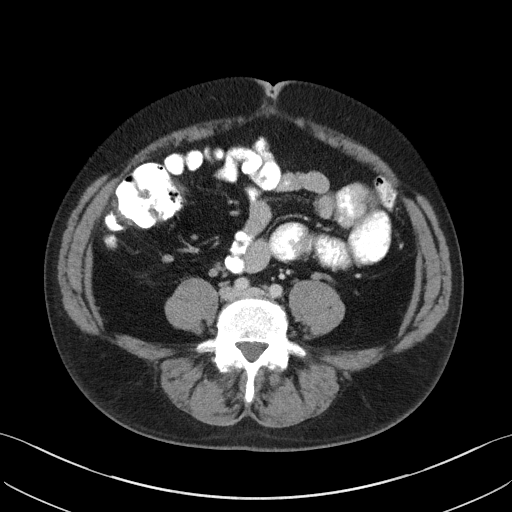
[im 50/99  soft-tissue]
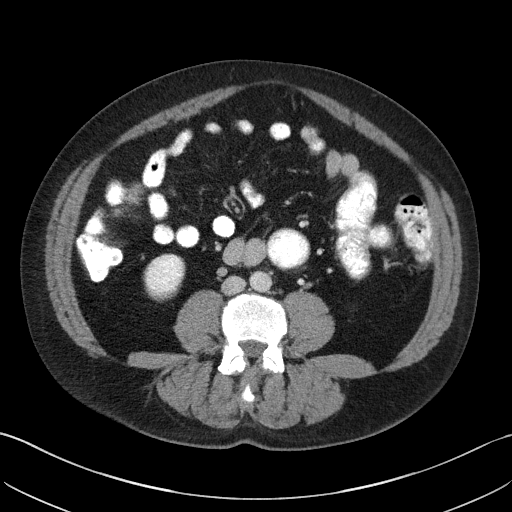
[im 57/99  soft-tissue]
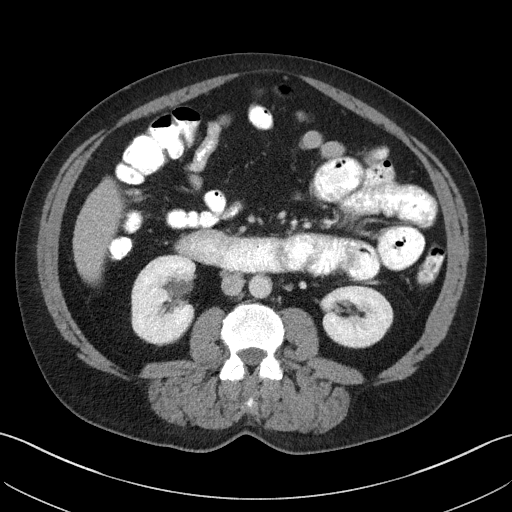
[im 64/99  soft-tissue]
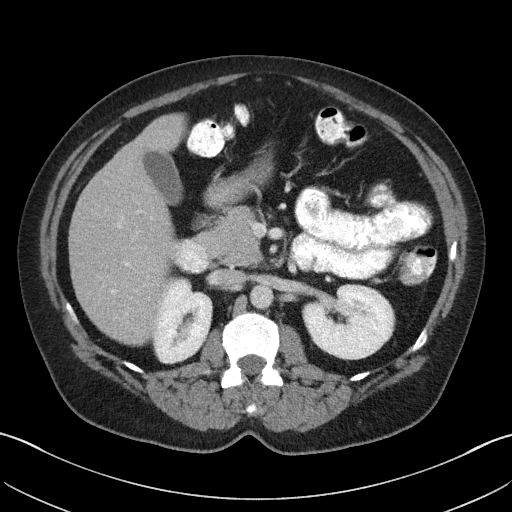
[im 64/99  bone]
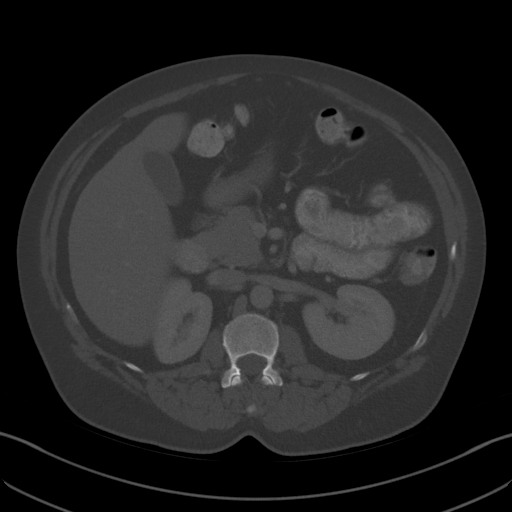
[im 71/99  soft-tissue]
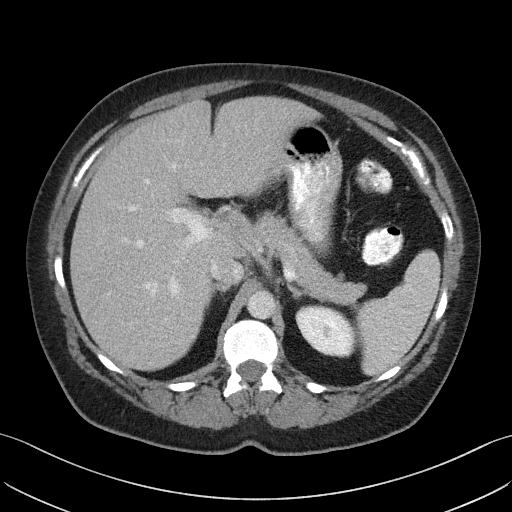
[im 78/99  soft-tissue]
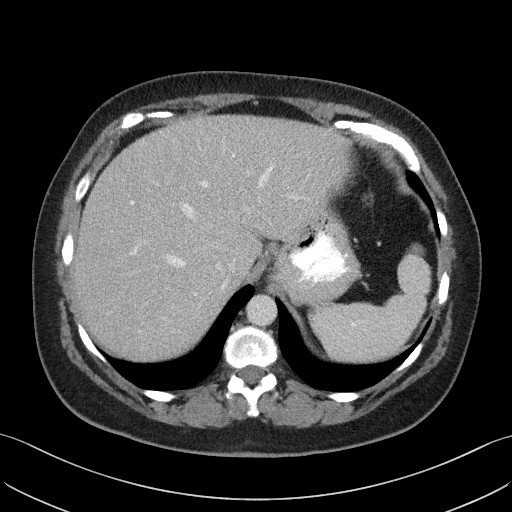
[im 85/99  soft-tissue]
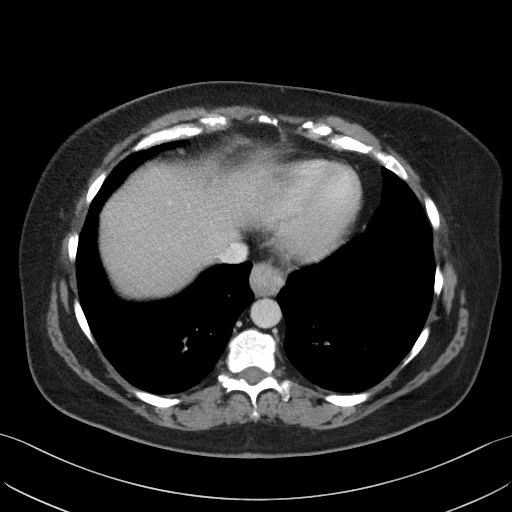
[im 92/99  soft-tissue]
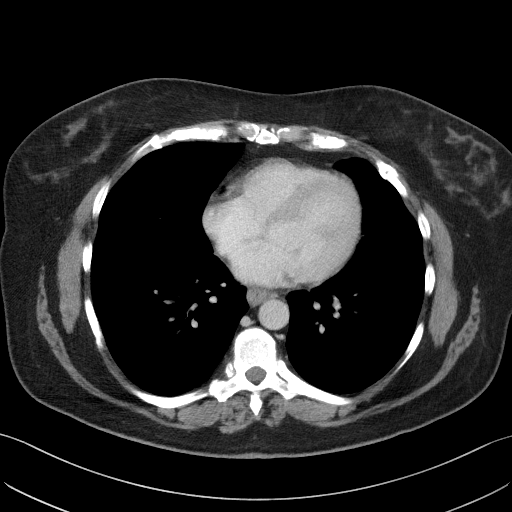

[Series 5: coronal st · coronal · 0.84mm/px · 3 of 99 slices shown]
[im 33/99  soft-tissue]
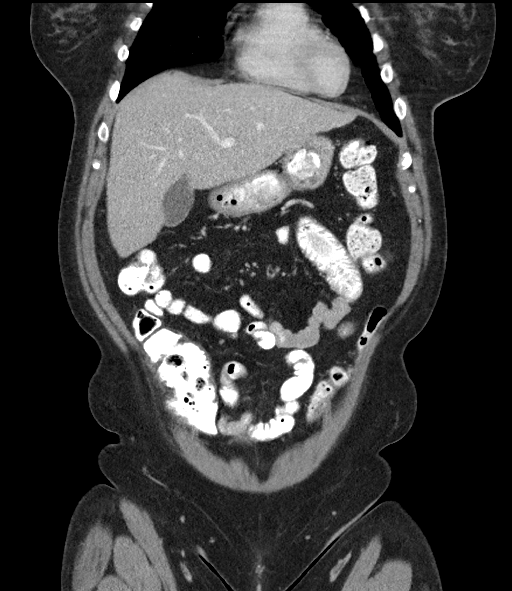
[im 44/99  soft-tissue]
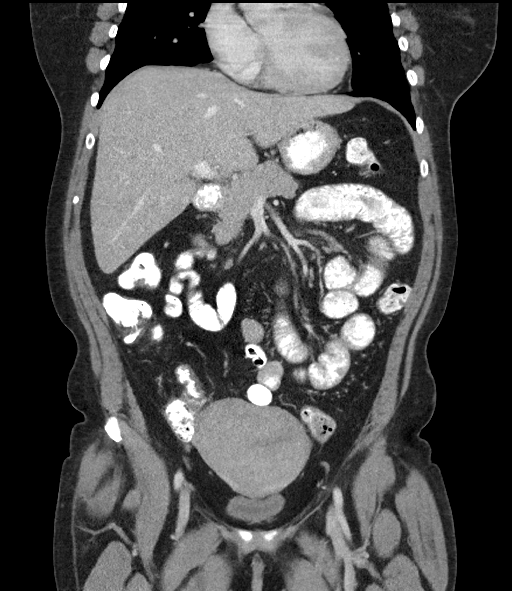
[im 55/99  soft-tissue]
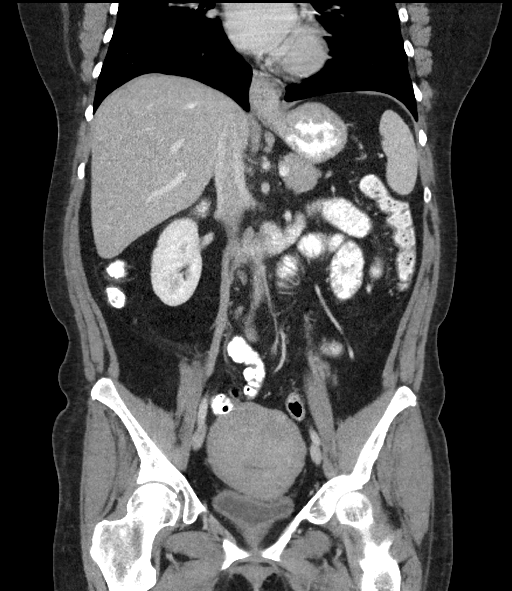

[16 of 46 positions shown; findings below may reference images not displayed]

FINDINGS: Lower chest: No acute abnormality.

Hepatobiliary: No focal liver abnormality is seen. There is mild
diffuse fatty infiltration of the liver parenchyma. No gallstones,
gallbladder wall thickening, or biliary dilatation.

Pancreas: Unremarkable. No pancreatic ductal dilatation or
surrounding inflammatory changes.

Spleen: Normal in size without focal abnormality.

Adrenals/Urinary Tract: Adrenal glands are unremarkable. Kidneys are
normal, without renal calculi, focal lesion, or hydronephrosis.
Bladder is unremarkable.

Stomach/Bowel: There is a small hiatal hernia. Appendix appears
normal. No evidence of bowel wall thickening, distention, or
inflammatory changes.

Vascular/Lymphatic: No significant vascular findings are present. No
enlarged abdominal or pelvic lymph nodes.

Reproductive: The uterus is enlarged and slightly heterogeneous in
appearance.

A 1.2 cm cyst is seen along the anterolateral aspect of the right
adnexa.

Other: No abdominal wall hernia or abnormality. No abdominopelvic
ascites.

Musculoskeletal: No acute or significant osseous findings.
IMPRESSION: 1. Mild fatty infiltration of the liver.
2. Small hiatal hernia.
3. Enlarged heterogeneous uterus which may contain noncalcified
uterine fibroids.
4. Right adnexal cyst, likely ovarian in origin.

## 2019-10-03 MED ORDER — IOHEXOL 300 MG/ML  SOLN
100.0000 mL | Freq: Once | INTRAMUSCULAR | Status: AC | PRN
  Administered 2019-10-03: 100 mL via INTRAVENOUS

## 2019-10-03 MED ORDER — SODIUM CHLORIDE (PF) 0.9 % IJ SOLN
INTRAMUSCULAR | Status: AC
  Filled 2019-10-03: qty 50

## 2019-10-04 ENCOUNTER — Telehealth: Payer: Self-pay

## 2019-10-06 NOTE — Telephone Encounter (Signed)
Told Judith Drake that the CT shows no enlarged lymph nodes. She has a small hiatal hernia. Enlarged heterogeneous uterus which may contain fibroids. A mild fatty liver. A right adnexal cyst likley ovarian in origin Per Judith John, NP. No need to follow up with [facility]. She can be followed by PCP/Gyn. Pt is interested in pursuing a hysterectomy. Told her that she can discuss this with Dr. Atlee Abide per Judith John, NP. Pt verbalized understanding.

## 2019-10-09 ENCOUNTER — Telehealth: Payer: Self-pay | Admitting: Radiology

## 2019-10-09 NOTE — Telephone Encounter (Signed)
Left message for patient to call [facility] to schedule with Dr Ilda Basset to f/u from CAT scan

## 2019-10-31 ENCOUNTER — Encounter: Payer: Self-pay | Admitting: Obstetrics and Gynecology

## 2019-10-31 ENCOUNTER — Other Ambulatory Visit: Payer: Self-pay

## 2019-10-31 ENCOUNTER — Ambulatory Visit: Payer: BLUE CROSS/BLUE SHIELD | Admitting: Obstetrics and Gynecology

## 2019-10-31 VITALS — BP 127/84 | HR 105 | Wt 174.0 lb

## 2019-10-31 DIAGNOSIS — N939 Abnormal uterine and vaginal bleeding, unspecified: Secondary | ICD-10-CM | POA: Diagnosis not present

## 2019-10-31 DIAGNOSIS — D259 Leiomyoma of uterus, unspecified: Secondary | ICD-10-CM

## 2019-10-31 NOTE — Progress Notes (Signed)
Obstetrics and Gynecology Established Patient Evaluation  Appointment Date: 10/31/2019  OBGYN Clinic: [facility] for [facility]  Primary Care Provider: Burnard Hawthorne  Referring Provider: Burnard Hawthorne, FNP  Chief Complaint:  Chief Complaint  Patient presents with  . Follow-up    CT SCAN     History of Present Illness: Judith Drake is a 49 y.o. Caucasian G2P2002 (No LMP recorded (lmp unknown).), seen for the above chief complaint. Her past medical history is significant for HTN, h/o etoh abuse, fibroid uterus, VB with anemia, h/o BTL  Patient intially seen by [place] in mid 2020 for VB, anemia during inpatient admission. She received PRBCs and was started on megace.   She was seen by Gyn Onc for lymphadenopathy but it resolved on repeat CT scan.    She currently has light, rare irregular bleeding, no pain. She continues to take the megace 40 bid.  Her work up thus far has had a negative embx (polypoid tissue), TSH, and ascus/hpv negative pap smear last year.    She has had hot flashes and night sweats for past few months  She is currently having some spotting.   Review of Systems: Pertinent items noted in HPI and remainder of comprehensive ROS otherwise negative.   Patient Active Problem List   Diagnosis Date Noted  . Abnormal uterine bleeding (AUB) 03/20/2019  . Dysmenorrhea 03/20/2019  . Hypomagnesemia   . Nonallopathic lesion of sacral region 03/28/2018  . Nonallopathic lesion of thoracic region 03/28/2018  . Nonallopathic lesion of lumbosacral region 03/28/2018  . Nonallopathic lesion of pelvic region 03/28/2018  . Nonallopathic lesion of cervical region 03/28/2018  . Enlarged thyroid 03/07/2018  . 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 Lumbar radiculopathy, right 02/08/2018  . GAD (generalized anxiety disorder) 01/03/2018  . Rash 01/03/2018  . Right hip pain 01/03/2018  . GERD (gastroesophageal reflux disease) 12/13/2017  .  Menorrhagia 12/13/2017  . Fibroid uterus 12/13/2017  . Depression 11/24/2013  . Fatigue 07/12/2013  . HTN (hypertension) 07/12/2013  . Alcohol abuse 07/12/2013  . Tobacco abuse 07/12/2013  . Yeast infection of the skin 07/12/2013  . Routine general medical examination at a health care facility 07/12/2013    Past Medical History:  Past Medical History:  Diagnosis Date  . Acute lower UTI 05/12/2019  . Acute respiratory failure with hypoxia ([place]) 04/10/2018  . Anemia   . Elevated liver enzymes 11/24/2013  . ETOH abuse   . Fibroid   . GERD (gastroesophageal reflux disease)   . Heavy menstrual period   . Hypertension   . SIRS (systemic inflammatory response syndrome) ([place]) 05/12/2019    Past Surgical History:  Past Surgical History:  Procedure Laterality Date  . ELBOW SURGERY     1997   . TUBAL LIGATION      Past Obstetrical History:  OB History  Gravida Para Term Preterm AB Living  2 2 2     2   SAB TAB Ectopic Multiple Live Births          2    # Outcome Date GA Lbr Len/2nd Weight Sex Delivery Anes PTL Lv  2 Term           1 Term             Obstetric Comments  Vaginal x 2    Past Gynecological History: As per HPI.  Social History:  Social History   Socioeconomic History  . Marital status: Married    Spouse name:  Lanny Drake  . Number of children: 2  . Years of education: 67  . Highest education level: Not on file  Occupational History  . Occupation: Homemaker   Tobacco Use  . Smoking status: Light Tobacco Smoker    Types: Cigarettes  . Smokeless tobacco: Current User  Substance and Sexual Activity  . Alcohol use: Yes  . Drug use: No  . Sexual activity: Yes    Partners: Male    Birth control/protection: Surgical  Other Topics Concern  . Not on file  Social History Narrative   Judith grew up in [place]. She attended [employer] for a certificate in accounting. She currently lives a home with her husband and 2 boys. Her oldest son has autism.  She enjoys going out with her family.    Social Determinants of Health   Financial Resource Strain:   . Difficulty of Paying Living Expenses: Not on file  Food Insecurity:   . Worried About Charity fundraiser in the Last Year: Not on file  . Ran Out of Food in the Last Year: Not on file  Transportation Needs:   . Lack of Transportation (Medical): Not on file  . Lack of Transportation (Non-Medical): Not on file  Physical Activity:   . Days of Exercise per Week: Not on file  . Minutes of Exercise per Session: Not on file  Stress:   . Feeling of Stress : Not on file  Social Connections:   . Frequency of Communication with Friends and Family: Not on file  . Frequency of Social Gatherings with Friends and Family: Not on file  . Attends Religious Services: Not on file  . Active Member of Clubs or Organizations: Not on file  . Attends Archivist Meetings: Not on file  . Marital Status: Not on file  Intimate Partner Violence:   . Fear of Current or Ex-Partner: Not on file  . Emotionally Abused: Not on file  . Physically Abused: Not on file  . Sexually Abused: Not on file    Family History:  Family History  Problem Relation Age of Onset  . Cancer Mother 97       Lung   . Hyperlipidemia Mother   . Hypertension Mother   . Stroke Mother   . Kidney disease Mother   . Diabetes Mother   . Heart disease Mother   . Hyperlipidemia Father   . Autism Son   . Cancer Paternal Grandfather 22       Colon Cancer - died  . Breast cancer Neg Hx     Medications [name]. Bensel had no medications administered during this visit. Current Outpatient Medications  Medication Sig Dispense Refill  . amLODipine (NORVASC) 10 MG tablet Take 1 tablet (10 mg total) by mouth daily. 30 tablet 5  . busPIRone (BUSPAR) 10 MG tablet Take 1 tablet (10 mg total) by mouth 3 (three) times daily. 270 tablet 2  . megestrol (MEGACE) 40 MG tablet Take 1 tablet (40 mg total) by mouth 2 (two) times  daily. 60 tablet 2  . Multiple Vitamins-Minerals (MULTIVITAMIN ADULT) TABS Take 1 tablet by mouth daily.    Marland Kitchen gabapentin (NEURONTIN) 100 MG capsule Take 2 capsules (200 mg total) by mouth at bedtime. (Patient not taking: Reported on 07/18/2019) 60 capsule 3  . traMADol (ULTRAM) 50 MG tablet Take 1 tablet (50 mg total) by mouth every 6 (six) hours as needed. (Patient not taking: Reported on 10/31/2019) 20 tablet 0  No current facility-administered medications for this visit.    Allergies Amoxicillin and Morphine and related   Physical Exam:  BP 127/84   Pulse (!) 105   Wt 174 lb (78.9 kg)   LMP  (LMP Unknown)   BMI 30.82 kg/m  Body mass index is 30.82 kg/m. General appearance: Well nourished, well developed female in no acute distress.  Cardiovascular: normal s1 and s2.  No murmurs, rubs or gallops. Respiratory:  Clear to auscultation bilateral. Normal respiratory effort Abdomen: positive bowel sounds and no masses, hernias; diffusely non tender to palpation, non distended Neuro/Psych:  Normal mood and affect.  Skin:  Warm and dry.  Lymphatic:  No inguinal lymphadenopathy.   Pelvic exam: is not limited by body habitus EGBUS: within normal limits Vagina: within normal limits and with scant, old blood in the vault Cervix: normal appearing cervix without tenderness, discharge or lesions.  Uterus:  enlarged, c/w 12 week size and non tender, mobile Adnexa:  normal adnexa and no mass, fullness, tenderness Rectovaginal: deferred  Laboratory: as per HPI  Radiology: CLINICAL DATA:  Vaginal bleeding for 8 weeks.  EXAM: TRANSABDOMINAL AND TRANSVAGINAL ULTRASOUND OF PELVIS  TECHNIQUE: Both transabdominal and transvaginal ultrasound examinations of the pelvis were performed. Transabdominal technique was performed for global imaging of the pelvis including uterus, ovaries, adnexal regions, and pelvic cul-de-sac. It was necessary to proceed with endovaginal exam following the  transabdominal exam to visualize the uterus, endometrium and ovaries to better advantage.  COMPARISON:  None  FINDINGS: Uterus  Measurements: 11.5 x 7.8 x 8.0 cm = volume: 375 mL. No masses. Hypervascular on color Doppler analysis. Heterogeneous echogenicity. Several small cystic spaces noted mostly along the junctional zone.  Endometrium  Thickness: 1.9 cm.  No focal abnormality visualized.  Right ovary  Measurements: 3.9 x 2.9 x 2.3 cm = volume: 13.8 mL. Normal appearance/no adnexal mass.  Left ovary  Not visualized.  Other findings  No abnormal free fluid.  IMPRESSION: 1. Endometrium is thickened to 1.9 cm. No endometrial mass. Heterogeneous appearance of the uterus with small cystic spaces, but no defined mass. If bleeding remains unresponsive to hormonal or medical therapy, focal lesion work-up with sonohysterogram should be considered. Endometrial biopsy should also be considered in pre-menopausal patients at high risk for endometrial carcinoma. (Ref: Radiological Reasoning: Algorithmic Workup of Abnormal Vaginal Bleeding with Endovaginal Sonography and Sonohysterography. AJR 2008; [id number]) 2. No other abnormality.  Left ovary not visualized.   Electronically Signed   By: Lajean Manes M.D.   On: 03/18/2019 05:56  Assessment: pt stable  Plan:  1. Abnormal uterine bleeding (AUB) Recommend staying on the bid megace for now and if it worsens she can always do two tabs bid. I d/w her re: staying on the megace, endometrial ablation (HTA), hysterectomy. I told her I'd recommend some type of surgery and both are very reasonable and good success rates with HTA (pt sounded to 9.5 on prior biopsy) especially with being perimenopausal. I told her I'd recommend TLH given size of uterus and also d/w her risk of open procedure. Pt would like to proceed with hysterectomy. Request sent for TLH/BS/cysto.    2. Uterine leiomyoma, unspecified location  RTC  post op  Durene Romans MD Attending Center for [facility])

## 2019-11-20 ENCOUNTER — Other Ambulatory Visit: Payer: Self-pay | Admitting: Obstetrics and Gynecology

## 2019-11-21 ENCOUNTER — Other Ambulatory Visit: Payer: Self-pay

## 2019-11-21 MED ORDER — MEGESTROL ACETATE 40 MG PO TABS: 40.0000 mg | ORAL_TABLET | Freq: Two times a day (BID) | ORAL | 5 refills | Status: DC

## 2019-11-21 NOTE — Telephone Encounter (Signed)
Patient is requesting a refill megace.

## 2019-12-12 ENCOUNTER — Other Ambulatory Visit: Payer: Self-pay | Admitting: Obstetrics and Gynecology

## 2019-12-14 NOTE — Pre-Procedure Instructions (Signed)
[place] [place] [address] Phone: [phone] Fax: [phone]      Your procedure is scheduled on Tuesday April 13th.  Report to [name] Entrance "A" at 12:00 P.M., and check in at the Admitting office.  Call this number if you have problems the morning of surgery:  [phone]  Call [phone] if you have any questions prior to your surgery date Monday-Friday 8am-4pm    Remember:  Do not eat after midnight the night before your surgery  You may drink clear liquids until 11:00am the morning of your surgery.   Clear liquids allowed are: Water, Non-Citrus Juices (without pulp), Carbonated Beverages, Clear Tea, Black Coffee Only, and Gatorade    Take these medicines the morning of surgery with A SIP OF WATER   amLODipine (NORVASC)  busPIRone (BUSPAR)   megestrol (MEGACE)  As of today, STOP taking any Aspirin (unless otherwise instructed by your surgeon) and Aspirin containing products, Aleve, Naproxen, Ibuprofen, Motrin, Advil, Goody's, BC's, all herbal medications, fish oil, and all vitamins.                      Do not wear jewelry, make up, or nail polish            Do not wear lotions, powders, perfumes, or deodorant.            Do not shave 48 hours prior to surgery.              Do not bring valuables to the hospital.            [facility] is not responsible for any belongings or valuables.  Do NOT Smoke (Tobacco/Vapping) or drink Alcohol 24 hours prior to your procedure If you use a CPAP at night, you may bring all equipment for your overnight stay.   Contacts, glasses, dentures or bridgework may not be worn into surgery.      For patients admitted to the hospital, discharge time will be determined by your treatment team.   Patients discharged the day of surgery will not be allowed to drive home, and someone needs to stay with them for 24 hours.    Special instructions:    [place]- Preparing For Surgery  Before surgery, you can play an important role. Because skin is not sterile, your skin needs to be as free of germs as possible. You can reduce the number of germs on your skin by washing with CHG (chlorahexidine gluconate) Soap before surgery.  CHG is an antiseptic cleaner which kills germs and bonds with the skin to continue killing germs even after washing.    Oral Hygiene is also important to reduce your risk of infection.  Remember - BRUSH YOUR TEETH THE MORNING OF SURGERY WITH YOUR REGULAR TOOTHPASTE  Please do not use if you have an allergy to CHG or antibacterial soaps. If your skin becomes reddened/irritated stop using the CHG.  Do not shave (including legs and underarms) for at least 48 hours prior to first CHG shower. It is OK to shave your face.  Please follow these instructions carefully.   1. Shower the NIGHT BEFORE SURGERY and the MORNING OF SURGERY with CHG Soap.   2. If you chose to wash your hair, wash your hair first as usual with your normal shampoo.  3. After you shampoo, rinse your hair and body thoroughly to remove the shampoo.  4.  Use CHG as you would any other liquid soap. You can apply CHG directly to the skin and wash gently with a scrungie or a clean washcloth.   5. Apply the CHG Soap to your body ONLY FROM THE NECK DOWN.  Do not use on open wounds or open sores. Avoid contact with your eyes, ears, mouth and genitals (private parts). Wash Face and genitals (private parts)  with your normal soap.   6. Wash thoroughly, paying special attention to the area where your surgery will be performed.  7. Thoroughly rinse your body with warm water from the neck down.  8. DO NOT shower/wash with your normal soap after using and rinsing off the CHG Soap.  9. Pat yourself dry with a CLEAN TOWEL.  10. Wear CLEAN PAJAMAS to bed the night before surgery, wear comfortable clothes the morning of surgery  11. Place CLEAN SHEETS on your bed  the night of your first shower and DO NOT SLEEP WITH PETS.   Day of Surgery:   Do not apply any deodorants/lotions.  Please wear clean clothes to the hospital/surgery center.   Remember to brush your teeth WITH YOUR REGULAR TOOTHPASTE.   Please read over the following fact sheets that you were given.

## 2019-12-15 ENCOUNTER — Other Ambulatory Visit: Payer: Self-pay

## 2019-12-15 ENCOUNTER — Other Ambulatory Visit (HOSPITAL_COMMUNITY)
Admission: RE | Admit: 2019-12-15 | Discharge: 2019-12-15 | Disposition: A | Discharge status: Home / Self Care | Payer: BLUE CROSS/BLUE SHIELD | Source: Ambulatory Visit | Attending: Obstetrics and Gynecology | Admitting: Obstetrics and Gynecology

## 2019-12-15 ENCOUNTER — Encounter (HOSPITAL_COMMUNITY): Payer: Self-pay

## 2019-12-15 ENCOUNTER — Encounter (HOSPITAL_COMMUNITY)
Admission: RE | Admit: 2019-12-15 | Discharge: 2019-12-15 | Disposition: A | Discharge status: Home / Self Care | Payer: BLUE CROSS/BLUE SHIELD | Source: Ambulatory Visit | Attending: Obstetrics and Gynecology | Admitting: Obstetrics and Gynecology

## 2019-12-15 DIAGNOSIS — Z01818 Encounter for other preprocedural examination: Secondary | ICD-10-CM | POA: Insufficient documentation

## 2019-12-15 DIAGNOSIS — Z20822 Contact with and (suspected) exposure to covid-19: Secondary | ICD-10-CM | POA: Insufficient documentation

## 2019-12-15 HISTORY — DX: Unspecified osteoarthritis, unspecified site: M19.90

## 2019-12-15 LAB — CBC
HCT: 43.5 % (ref 36.0–46.0)
Hemoglobin: 13.6 g/dL (ref 12.0–15.0)
MCH: 27 pg (ref 26.0–34.0)
MCHC: 31.3 g/dL (ref 30.0–36.0)
MCV: 86.3 fL (ref 80.0–100.0)
Platelets: 350 10*3/uL (ref 150–400)
RBC: 5.04 MIL/uL (ref 3.87–5.11)
RDW: 14.7 % (ref 11.5–15.5)
WBC: 9 10*3/uL (ref 4.0–10.5)
nRBC: 0 % (ref 0.0–0.2)

## 2019-12-15 LAB — TYPE AND SCREEN
ABO/RH(D): A POS
Antibody Screen: NEGATIVE

## 2019-12-15 LAB — COMPREHENSIVE METABOLIC PANEL
ALT: 45 U/L — ABNORMAL HIGH (ref 0–44)
AST: 64 U/L — ABNORMAL HIGH (ref 15–41)
Albumin: 4.3 g/dL (ref 3.5–5.0)
Alkaline Phosphatase: 70 U/L (ref 38–126)
Anion gap: 15 (ref 5–15)
BUN: 9 mg/dL (ref 6–20)
CO2: 28 mmol/L (ref 22–32)
Calcium: 9.8 mg/dL (ref 8.9–10.3)
Chloride: 95 mmol/L — ABNORMAL LOW (ref 98–111)
Creatinine, Ser: 0.88 mg/dL (ref 0.44–1.00)
GFR calc Af Amer: >60 mL/min (ref >60)
GFR calc non Af Amer: >60 mL/min (ref >60)
Glucose, Bld: 101 mg/dL — ABNORMAL HIGH (ref 70–99)
Potassium: 2.8 mmol/L — ABNORMAL LOW (ref 3.5–5.1)
Sodium: 138 mmol/L (ref 135–145)
Total Bilirubin: 1 mg/dL (ref 0.3–1.2)
Total Protein: 8.6 g/dL — ABNORMAL HIGH (ref 6.5–8.1)

## 2019-12-15 LAB — SARS CORONAVIRUS 2 (TAT 6-24 HRS): SARS Coronavirus 2: NEGATIVE

## 2019-12-15 NOTE — Progress Notes (Addendum)
PCP - Mable Paris, FNP Cardiologist - Denies  PPM/ICD - Denies  Chest x-ray - N/A EKG - 12/15/19 Stress Test - Denies ECHO - Denies Cardiac Cath - Denies  Sleep Study - Denies  Patient denies being a diabetic.  Blood Thinner Instructions: N/A Aspirin Instructions: N/A  ERAS Protcol - Yes PRE-SURGERY Ensure or G2- Not ordered.  COVID TEST- 12/15/19   Anesthesia review: Potassium 2.8.  Patient denies shortness of breath, fever, cough and chest pain at PAT appointment   All instructions explained to the patient, with a verbal understanding of the material. Patient agrees to go over the instructions while at home for a better understanding. Patient also instructed to self quarantine after being tested for COVID-19. The opportunity to ask questions was provided.

## 2019-12-15 NOTE — Progress Notes (Signed)
Patient with elevated HR and BP. Per patient, Diastolc BP number is around her baseline. Per patient, at baseline her HR is in the low 100s. EKG obtained per surgeon's orders.    12/15/19 1021  Vitals  Temp 98.7 F (37.1 C)  Pulse Rate (!) 112  Resp 18  BP (!) 122/93  SpO2 99 %  Height and Weight  Height 5\' 3"  (1.6 m)  Height Method Stated  Weight 78.2 kg  Weight Method Actual  BMI (Calculated) 30.53  BSA (Calculated - sq m) 1.86 sq meters

## 2019-12-15 NOTE — Progress Notes (Signed)
Abnormal CMP results called to Dr. Ilda Basset' office.

## 2019-12-18 ENCOUNTER — Telehealth: Payer: Self-pay | Admitting: Obstetrics and Gynecology

## 2019-12-18 DIAGNOSIS — E876 Hypokalemia: Secondary | ICD-10-CM

## 2019-12-18 DIAGNOSIS — R7401 Elevation of levels of liver transaminase levels: Secondary | ICD-10-CM | POA: Insufficient documentation

## 2019-12-18 MED ORDER — POTASSIUM CHLORIDE CRYS ER 20 MEQ PO TBCR: 40.0000 meq | EXTENDED_RELEASE_TABLET | Freq: Every day | ORAL | 0 refills | Status: DC

## 2019-12-18 NOTE — Anesthesia Preprocedure Evaluation (Addendum)
Anesthesia Evaluation  Patient identified by MRN, date of birth, ID band Patient awake    Reviewed: Allergy & Precautions, NPO status , Patient's Chart, lab work & pertinent test results  Airway Mallampati: III  TM Distance: >3 FB Neck ROM: Full    Dental no notable dental hx. (+) Teeth Intact, Dental Advisory Given,    Pulmonary Current Smoker,  Current heavy tobacco use- 1/2 ppd x 30 years  No inhalers    Pulmonary exam normal breath sounds clear to auscultation       Cardiovascular hypertension, Pt. on medications negative cardio ROS Normal cardiovascular exam Rhythm:Regular Rate:Normal     Neuro/Psych PSYCHIATRIC DISORDERS Anxiety Depression negative neurological ROS     GI/Hepatic GERD  Controlled,(+)     substance abuse  alcohol use, Chronic slightly elevated LFTs   Endo/Other  Obesity BMI 31  Renal/GU negative Renal ROS  Female GU complaint Abnormal uterine bleeding, uterine fibroids     Musculoskeletal  (+) Arthritis , Osteoarthritis,    Abdominal (+) + obese,   Peds negative pediatric ROS (+)  Hematology negative hematology ROS (+) hct 43.5   Anesthesia Other Findings Hypokalemia- on OP K supplements  Reproductive/Obstetrics negative OB ROS                           Anesthesia Physical Anesthesia Plan  ASA: III  Anesthesia Plan: General   Post-op Pain Management:    Induction: Intravenous  PONV Risk Score and Plan: 3 and Ondansetron, Dexamethasone, Midazolam, Treatment may vary due to age or medical condition and Scopolamine patch - Pre-op  Airway Management Planned: Oral ETT and Video Laryngoscope Planned  Additional Equipment: None  Intra-op Plan:   Post-operative Plan: Extubation in OR  Informed Consent: I have reviewed the patients History and Physical, chart, labs and discussed the procedure including the risks, benefits and alternatives for the proposed  anesthesia with the patient or authorized representative who has indicated his/her understanding and acceptance.     Dental advisory given  Plan Discussed with: CRNA  Anesthesia Plan Comments: ( )      Anesthesia Quick Evaluation

## 2019-12-18 NOTE — Telephone Encounter (Signed)
GYN Note Patient called and VM identified it as her.  I told that Kdur 40 bid was sent into the pharmacy for her  Durene Romans MD Attending Center for [facility] (Faculty Practice) 12/18/2019 Time: [phone]

## 2019-12-19 ENCOUNTER — Inpatient Hospital Stay (HOSPITAL_COMMUNITY): Payer: BLUE CROSS/BLUE SHIELD | Admitting: Physician Assistant

## 2019-12-19 ENCOUNTER — Other Ambulatory Visit: Payer: Self-pay | Admitting: Obstetrics and Gynecology

## 2019-12-19 ENCOUNTER — Observation Stay (HOSPITAL_COMMUNITY)
Admission: RE | Admit: 2019-12-19 | Discharge: 2019-12-20 | Disposition: A | Discharge status: Home / Self Care | Payer: BLUE CROSS/BLUE SHIELD | Attending: Obstetrics and Gynecology | Admitting: Obstetrics and Gynecology

## 2019-12-19 ENCOUNTER — Other Ambulatory Visit: Payer: Self-pay

## 2019-12-19 ENCOUNTER — Encounter (HOSPITAL_COMMUNITY)
Admission: RE | Disposition: A | Discharge status: Home / Self Care | Payer: Self-pay | Source: Home / Self Care | Attending: Obstetrics and Gynecology

## 2019-12-19 DIAGNOSIS — Z79899 Other long term (current) drug therapy: Secondary | ICD-10-CM | POA: Insufficient documentation

## 2019-12-19 DIAGNOSIS — K760 Fatty (change of) liver, not elsewhere classified: Secondary | ICD-10-CM | POA: Insufficient documentation

## 2019-12-19 DIAGNOSIS — N939 Abnormal uterine and vaginal bleeding, unspecified: Secondary | ICD-10-CM | POA: Diagnosis present

## 2019-12-19 DIAGNOSIS — M1990 Unspecified osteoarthritis, unspecified site: Secondary | ICD-10-CM | POA: Diagnosis not present

## 2019-12-19 DIAGNOSIS — Z8249 Family history of ischemic heart disease and other diseases of the circulatory system: Secondary | ICD-10-CM | POA: Insufficient documentation

## 2019-12-19 DIAGNOSIS — Z880 Allergy status to penicillin: Secondary | ICD-10-CM | POA: Insufficient documentation

## 2019-12-19 DIAGNOSIS — Z885 Allergy status to narcotic agent status: Secondary | ICD-10-CM | POA: Insufficient documentation

## 2019-12-19 DIAGNOSIS — K219 Gastro-esophageal reflux disease without esophagitis: Secondary | ICD-10-CM | POA: Diagnosis not present

## 2019-12-19 DIAGNOSIS — N800 Endometriosis of uterus: Secondary | ICD-10-CM | POA: Diagnosis not present

## 2019-12-19 DIAGNOSIS — D259 Leiomyoma of uterus, unspecified: Secondary | ICD-10-CM | POA: Insufficient documentation

## 2019-12-19 DIAGNOSIS — R87612 Low grade squamous intraepithelial lesion on cytologic smear of cervix (LGSIL): Principal | ICD-10-CM | POA: Insufficient documentation

## 2019-12-19 DIAGNOSIS — I10 Essential (primary) hypertension: Secondary | ICD-10-CM | POA: Diagnosis not present

## 2019-12-19 DIAGNOSIS — Z7982 Long term (current) use of aspirin: Secondary | ICD-10-CM | POA: Insufficient documentation

## 2019-12-19 DIAGNOSIS — N870 Mild cervical dysplasia: Secondary | ICD-10-CM | POA: Diagnosis not present

## 2019-12-19 DIAGNOSIS — F17210 Nicotine dependence, cigarettes, uncomplicated: Secondary | ICD-10-CM | POA: Diagnosis not present

## 2019-12-19 DIAGNOSIS — F418 Other specified anxiety disorders: Secondary | ICD-10-CM | POA: Diagnosis not present

## 2019-12-19 DIAGNOSIS — Z90710 Acquired absence of both cervix and uterus: Secondary | ICD-10-CM | POA: Diagnosis present

## 2019-12-19 HISTORY — PX: TOTAL LAPAROSCOPIC HYSTERECTOMY WITH SALPINGECTOMY: SHX6742

## 2019-12-19 HISTORY — PX: CYSTOSCOPY: SHX5120

## 2019-12-19 LAB — POCT I-STAT, CHEM 8
BUN: 6 mg/dL (ref 6–20)
Calcium, Ion: 1.2 mmol/L (ref 1.15–1.40)
Chloride: 103 mmol/L (ref 98–111)
Creatinine, Ser: 0.6 mg/dL (ref 0.44–1.00)
Glucose, Bld: 107 mg/dL — ABNORMAL HIGH (ref 70–99)
HCT: 38 % (ref 36.0–46.0)
Hemoglobin: 12.9 g/dL (ref 12.0–15.0)
Potassium: 3.8 mmol/L (ref 3.5–5.1)
Sodium: 138 mmol/L (ref 135–145)
TCO2: 25 mmol/L (ref 22–32)

## 2019-12-19 SURGERY — HYSTERECTOMY, TOTAL, LAPAROSCOPIC, WITH SALPINGECTOMY
Anesthesia: General | Site: Bladder

## 2019-12-19 MED ORDER — SIMETHICONE 80 MG PO TABS: 1.0000 | ORAL_TABLET | Freq: Four times a day (QID) | ORAL | 0 refills | Status: DC | PRN

## 2019-12-19 MED ORDER — LIDOCAINE 2% (20 MG/ML) 5 ML SYRINGE
INTRAMUSCULAR | Status: DC | PRN
  Administered 2019-12-19: 60 mg via INTRAVENOUS

## 2019-12-19 MED ORDER — ONDANSETRON HCL 4 MG/2ML IJ SOLN
INTRAMUSCULAR | Status: AC
  Filled 2019-12-19: qty 2

## 2019-12-19 MED ORDER — FENTANYL CITRATE (PF) 250 MCG/5ML IJ SOLN
INTRAMUSCULAR | Status: AC
  Filled 2019-12-19: qty 5

## 2019-12-19 MED ORDER — FENTANYL CITRATE (PF) 250 MCG/5ML IJ SOLN
INTRAMUSCULAR | Status: DC | PRN
  Administered 2019-12-19: 50 ug via INTRAVENOUS
  Administered 2019-12-19: 100 ug via INTRAVENOUS
  Administered 2019-12-19 (×2): 50 ug via INTRAVENOUS

## 2019-12-19 MED ORDER — LACTATED RINGERS IV SOLN: INTRAVENOUS | Status: DC | PRN

## 2019-12-19 MED ORDER — CEFAZOLIN SODIUM-DEXTROSE 2-4 GM/100ML-% IV SOLN
2.0000 g | INTRAVENOUS | Status: AC
  Administered 2019-12-19: 14:00:00 2 g via INTRAVENOUS

## 2019-12-19 MED ORDER — KETOROLAC TROMETHAMINE 30 MG/ML IJ SOLN
INTRAMUSCULAR | Status: AC
  Filled 2019-12-19: qty 1

## 2019-12-19 MED ORDER — HYDROMORPHONE HCL 1 MG/ML IJ SOLN
1.0000 mg | INTRAMUSCULAR | Status: DC | PRN
  Administered 2019-12-20: 1 mg via INTRAVENOUS
  Filled 2019-12-19: qty 1

## 2019-12-19 MED ORDER — BUSPIRONE HCL 5 MG PO TABS
10.0000 mg | ORAL_TABLET | Freq: Two times a day (BID) | ORAL | Status: DC
  Administered 2019-12-19 – 2019-12-20 (×2): 10 mg via ORAL
  Filled 2019-12-19 (×2): qty 2

## 2019-12-19 MED ORDER — DEXAMETHASONE SODIUM PHOSPHATE 10 MG/ML IJ SOLN
INTRAMUSCULAR | Status: AC
  Filled 2019-12-19: qty 1

## 2019-12-19 MED ORDER — OXYCODONE HCL 5 MG PO TABS: 5.0000 mg | ORAL_TABLET | Freq: Once | ORAL | Status: DC | PRN

## 2019-12-19 MED ORDER — CEFAZOLIN SODIUM-DEXTROSE 2-4 GM/100ML-% IV SOLN
INTRAVENOUS | Status: AC
  Filled 2019-12-19: qty 100

## 2019-12-19 MED ORDER — DIPHENHYDRAMINE HCL 50 MG/ML IJ SOLN
12.5000 mg | Freq: Once | INTRAMUSCULAR | Status: AC
  Administered 2019-12-19: 12.5 mg via INTRAVENOUS

## 2019-12-19 MED ORDER — OXYCODONE-ACETAMINOPHEN 5-325 MG PO TABS: 1.0000 | ORAL_TABLET | Freq: Four times a day (QID) | ORAL | 0 refills | Status: DC | PRN

## 2019-12-19 MED ORDER — SUGAMMADEX SODIUM 200 MG/2ML IV SOLN
INTRAVENOUS | Status: DC | PRN
  Administered 2019-12-19: 190 mg via INTRAVENOUS

## 2019-12-19 MED ORDER — PHENYLEPHRINE HCL-NACL 10-0.9 MG/250ML-% IV SOLN
INTRAVENOUS | Status: DC | PRN
  Administered 2019-12-19: 25 ug/min via INTRAVENOUS

## 2019-12-19 MED ORDER — HYDROMORPHONE HCL 1 MG/ML IJ SOLN
INTRAMUSCULAR | Status: AC
  Filled 2019-12-19: qty 1

## 2019-12-19 MED ORDER — LACTATED RINGERS IV SOLN: INTRAVENOUS | Status: AC

## 2019-12-19 MED ORDER — DEXAMETHASONE SODIUM PHOSPHATE 10 MG/ML IJ SOLN
INTRAMUSCULAR | Status: DC | PRN
  Administered 2019-12-19: 10 mg via INTRAVENOUS

## 2019-12-19 MED ORDER — MIDAZOLAM HCL 2 MG/2ML IJ SOLN
INTRAMUSCULAR | Status: DC | PRN
  Administered 2019-12-19: 2 mg via INTRAVENOUS

## 2019-12-19 MED ORDER — POLYETHYLENE GLYCOL 3350 17 G PO PACK: 17.0000 g | PACK | Freq: Every day | ORAL | 0 refills | Status: DC

## 2019-12-19 MED ORDER — PROPOFOL 10 MG/ML IV BOLUS
INTRAVENOUS | Status: DC | PRN
  Administered 2019-12-19: 200 mg via INTRAVENOUS

## 2019-12-19 MED ORDER — FLUORESCEIN SODIUM 10 % IV SOLN
INTRAVENOUS | Status: AC
  Filled 2019-12-19: qty 5

## 2019-12-19 MED ORDER — BUPIVACAINE HCL (PF) 0.5 % IJ SOLN
INTRAMUSCULAR | Status: AC
  Filled 2019-12-19: qty 30

## 2019-12-19 MED ORDER — MIDAZOLAM HCL 2 MG/2ML IJ SOLN
INTRAMUSCULAR | Status: AC
  Filled 2019-12-19: qty 2

## 2019-12-19 MED ORDER — ACETAMINOPHEN 500 MG PO TABS
1000.0000 mg | ORAL_TABLET | Freq: Once | ORAL | Status: AC
  Administered 2019-12-19: 1000 mg via ORAL
  Filled 2019-12-19: qty 2

## 2019-12-19 MED ORDER — OXYCODONE HCL 5 MG/5ML PO SOLN: 5.0000 mg | Freq: Once | ORAL | Status: DC | PRN

## 2019-12-19 MED ORDER — IBUPROFEN 600 MG PO TABS: 600.0000 mg | ORAL_TABLET | Freq: Four times a day (QID) | ORAL | 0 refills | Status: DC | PRN

## 2019-12-19 MED ORDER — STERILE WATER FOR IRRIGATION IR SOLN
Status: DC | PRN
  Administered 2019-12-19: 1000 mL

## 2019-12-19 MED ORDER — AMLODIPINE BESYLATE 10 MG PO TABS: 10.0000 mg | ORAL_TABLET | Freq: Every day | ORAL | Status: DC

## 2019-12-19 MED ORDER — SIMETHICONE 80 MG PO CHEW
80.0000 mg | CHEWABLE_TABLET | Freq: Three times a day (TID) | ORAL | Status: DC
  Administered 2019-12-19 – 2019-12-20 (×2): 80 mg via ORAL
  Filled 2019-12-19 (×2): qty 1

## 2019-12-19 MED ORDER — LIDOCAINE 2% (20 MG/ML) 5 ML SYRINGE
INTRAMUSCULAR | Status: AC
  Filled 2019-12-19: qty 5

## 2019-12-19 MED ORDER — PROPOFOL 10 MG/ML IV BOLUS
INTRAVENOUS | Status: AC
  Filled 2019-12-19: qty 20

## 2019-12-19 MED ORDER — PHENYLEPHRINE 40 MCG/ML (10ML) SYRINGE FOR IV PUSH (FOR BLOOD PRESSURE SUPPORT)
PREFILLED_SYRINGE | INTRAVENOUS | Status: DC | PRN
  Administered 2019-12-19: 80 ug via INTRAVENOUS

## 2019-12-19 MED ORDER — PROMETHAZINE HCL 25 MG/ML IJ SOLN: 6.2500 mg | INTRAMUSCULAR | Status: DC | PRN

## 2019-12-19 MED ORDER — FLUORESCEIN SODIUM 10 % IV SOLN
INTRAVENOUS | Status: DC | PRN
  Administered 2019-12-19: 50 mg via INTRAVENOUS

## 2019-12-19 MED ORDER — ONDANSETRON HCL 4 MG/2ML IJ SOLN: 4.0000 mg | Freq: Four times a day (QID) | INTRAMUSCULAR | Status: DC | PRN

## 2019-12-19 MED ORDER — HYDROMORPHONE HCL 1 MG/ML IJ SOLN
0.2500 mg | INTRAMUSCULAR | Status: DC | PRN
  Administered 2019-12-19 (×3): 0.5 mg via INTRAVENOUS
  Administered 2019-12-19 (×2): 0.25 mg via INTRAVENOUS

## 2019-12-19 MED ORDER — LACTATED RINGERS IV SOLN: INTRAVENOUS | Status: DC

## 2019-12-19 MED ORDER — KETOROLAC TROMETHAMINE 30 MG/ML IJ SOLN
30.0000 mg | Freq: Once | INTRAMUSCULAR | Status: AC | PRN
  Administered 2019-12-19: 21:00:00 30 mg via INTRAVENOUS

## 2019-12-19 MED ORDER — PANTOPRAZOLE SODIUM 40 MG IV SOLR
40.0000 mg | Freq: Every day | INTRAVENOUS | Status: DC
  Administered 2019-12-19: 40 mg via INTRAVENOUS
  Filled 2019-12-19: qty 40

## 2019-12-19 MED ORDER — ROCURONIUM BROMIDE 10 MG/ML (PF) SYRINGE
PREFILLED_SYRINGE | INTRAVENOUS | Status: DC | PRN
  Administered 2019-12-19: 10 mg via INTRAVENOUS
  Administered 2019-12-19: 20 mg via INTRAVENOUS
  Administered 2019-12-19: 60 mg via INTRAVENOUS

## 2019-12-19 MED ORDER — IBUPROFEN 600 MG PO TABS
600.0000 mg | ORAL_TABLET | Freq: Four times a day (QID) | ORAL | Status: DC
  Administered 2019-12-20 (×2): 600 mg via ORAL
  Filled 2019-12-19 (×2): qty 1

## 2019-12-19 MED ORDER — MEPERIDINE HCL 25 MG/ML IJ SOLN: 6.2500 mg | INTRAMUSCULAR | Status: DC | PRN

## 2019-12-19 MED ORDER — ONDANSETRON HCL 4 MG PO TABS: 4.0000 mg | ORAL_TABLET | Freq: Four times a day (QID) | ORAL | Status: DC | PRN

## 2019-12-19 MED ORDER — SODIUM CHLORIDE 0.9 % IR SOLN
Status: DC | PRN
  Administered 2019-12-19: 3000 mL

## 2019-12-19 MED ORDER — PHENYLEPHRINE 40 MCG/ML (10ML) SYRINGE FOR IV PUSH (FOR BLOOD PRESSURE SUPPORT)
PREFILLED_SYRINGE | INTRAVENOUS | Status: AC
  Filled 2019-12-19: qty 10

## 2019-12-19 MED ORDER — OXYCODONE HCL 5 MG PO TABS
5.0000 mg | ORAL_TABLET | ORAL | Status: DC | PRN
  Administered 2019-12-19: 10 mg via ORAL
  Administered 2019-12-20: 5 mg via ORAL
  Administered 2019-12-20: 02:00:00 10 mg via ORAL
  Filled 2019-12-19 (×3): qty 2

## 2019-12-19 MED ORDER — DIPHENHYDRAMINE HCL 50 MG/ML IJ SOLN
INTRAMUSCULAR | Status: AC
  Filled 2019-12-19: qty 1

## 2019-12-19 MED ORDER — SCOPOLAMINE 1 MG/3DAYS TD PT72
1.0000 | MEDICATED_PATCH | TRANSDERMAL | Status: DC
  Administered 2019-12-19: 13:00:00 1.5 mg via TRANSDERMAL
  Filled 2019-12-19: qty 1

## 2019-12-19 MED ORDER — POLYETHYLENE GLYCOL 3350 17 G PO PACK: 17.0000 g | PACK | Freq: Every day | ORAL | Status: DC

## 2019-12-19 SURGICAL SUPPLY — 57 items
ADH SKN CLS APL DERMABOND .7 (GAUZE/BANDAGES/DRESSINGS) ×2
APL SRG 38 LTWT LNG FL B (MISCELLANEOUS)
APPLICATOR ARISTA FLEXITIP XL (MISCELLANEOUS) IMPLANT
BARRIER ADHS 3X4 INTERCEED (GAUZE/BANDAGES/DRESSINGS) IMPLANT
BLADE SURG 15 STRL LF DISP TIS (BLADE) ×2 IMPLANT
BLADE SURG 15 STRL SS (BLADE) ×3
BRR ADH 4X3 ABS CNTRL BYND (GAUZE/BANDAGES/DRESSINGS)
CABLE HIGH FREQUENCY MONO STRZ (ELECTRODE) ×3 IMPLANT
CANISTER SUCT 3000ML PPV (MISCELLANEOUS) ×3 IMPLANT
DECANTER SPIKE VIAL GLASS SM (MISCELLANEOUS) ×3 IMPLANT
DEFOGGER SCOPE WARMER CLEARIFY (MISCELLANEOUS) ×3 IMPLANT
DERMABOND ADVANCED (GAUZE/BANDAGES/DRESSINGS) ×1
DERMABOND ADVANCED .7 DNX12 (GAUZE/BANDAGES/DRESSINGS) ×2 IMPLANT
DEVICE SUTURE ENDOST 10MM (ENDOMECHANICALS) ×1 IMPLANT
DEVICE TROCAR PUNCTURE CLOSURE (ENDOMECHANICALS) ×1 IMPLANT
DURAPREP 26ML APPLICATOR (WOUND CARE) ×3 IMPLANT
GAUZE SPONGE 4X4 16PLY XRAY LF (GAUZE/BANDAGES/DRESSINGS) ×1 IMPLANT
GLOVE BIOGEL PI IND STRL 7.0 (GLOVE) ×4 IMPLANT
GLOVE BIOGEL PI IND STRL 7.5 (GLOVE) ×4 IMPLANT
GLOVE BIOGEL PI INDICATOR 7.0 (GLOVE) ×6
GLOVE BIOGEL PI INDICATOR 7.5 (GLOVE) ×3
GLOVE SURG SS PI 7.0 STRL IVOR (GLOVE) ×15 IMPLANT
GOWN STRL REUS W/ TWL LRG LVL3 (GOWN DISPOSABLE) ×6 IMPLANT
GOWN STRL REUS W/ TWL XL LVL3 (GOWN DISPOSABLE) ×4 IMPLANT
GOWN STRL REUS W/TWL LRG LVL3 (GOWN DISPOSABLE) ×9
GOWN STRL REUS W/TWL XL LVL3 (GOWN DISPOSABLE) ×6
HEMOSTAT ARISTA ABSORB 3G PWDR (HEMOSTASIS) IMPLANT
HIBICLENS CHG 4% 4OZ BTL (MISCELLANEOUS) ×6 IMPLANT
KIT TURNOVER KIT B (KITS) ×3 IMPLANT
LIGASURE VESSEL 5MM BLUNT TIP (ELECTROSURGICAL) ×1 IMPLANT
MANIPULATOR VCARE LG CRV RETR (MISCELLANEOUS) IMPLANT
MANIPULATOR VCARE SML CRV RETR (MISCELLANEOUS) IMPLANT
MANIPULATOR VCARE STD CRV RETR (MISCELLANEOUS) ×1 IMPLANT
OCCLUDER COLPOPNEUMO (BALLOONS) ×4 IMPLANT
PACK LAPAROSCOPY BASIN (CUSTOM PROCEDURE TRAY) ×3 IMPLANT
PACK TRENDGUARD 450 HYBRID PRO (MISCELLANEOUS) IMPLANT
PAD ARMBOARD 7.5X6 YLW CONV (MISCELLANEOUS) ×6 IMPLANT
PAD OB MATERNITY 4.3X12.25 (PERSONAL CARE ITEMS) ×3 IMPLANT
POUCH LAPAROSCOPIC INSTRUMENT (MISCELLANEOUS) ×7 IMPLANT
SCISSORS LAP 5X35 DISP (ENDOMECHANICALS) ×3 IMPLANT
SET IRRIG TUBING LAPAROSCOPIC (IRRIGATION / IRRIGATOR) ×3 IMPLANT
SET IRRIG Y TYPE TUR BLADDER L (SET/KITS/TRAYS/PACK) ×1 IMPLANT
SET TUBE SMOKE EVAC HIGH FLOW (TUBING) ×3 IMPLANT
SOLUTION ELECTROLUBE (MISCELLANEOUS) IMPLANT
SUT ENDO VLOC 180-0-8IN (SUTURE) IMPLANT
SUT MNCRL AB 4-0 PS2 18 (SUTURE) ×7 IMPLANT
SUT VICRYL 0 UR6 27IN ABS (SUTURE) ×6 IMPLANT
SYR 10ML LL (SYRINGE) ×3 IMPLANT
SYR 50ML LL SCALE MARK (SYRINGE) ×4 IMPLANT
SYSTEM CARTER THOMASON II (TROCAR) IMPLANT
TOWEL GREEN STERILE FF (TOWEL DISPOSABLE) ×6 IMPLANT
TRAY FOLEY W/BAG SLVR 14FR LF (SET/KITS/TRAYS/PACK) ×3 IMPLANT
TRENDGUARD 450 HYBRID PRO PACK (MISCELLANEOUS) ×3
TROCAR ADV FIXATION 5X100MM (TROCAR) ×2 IMPLANT
TROCAR BALLN 12MMX100 BLUNT (TROCAR) ×1 IMPLANT
TROCAR XCEL NON-BLD 11X100MML (ENDOMECHANICALS) ×1 IMPLANT
UNDERPAD 30X36 HEAVY ABSORB (UNDERPADS AND DIAPERS) ×3 IMPLANT

## 2019-12-19 NOTE — Discharge Instructions (Signed)
Laparoscopic Surgery Discharge Instructions  Instructions Following Major Laparoscopic Surgery You have just undergone a major laparoscopic surgery.  The following list should answer your most common questions.  Although we will discuss your surgery and post-operative instructions with you prior to your discharge, this list will serve as a reminder if you fail to recall the details of what we discussed.  We will discuss your surgery once again in detail at your post-op visit in two to four weeks. If you haven't already done so, please call to make your appointment as soon as possible.  How you will feel: Although you have just undergone a major surgery, your recovery will be significantly shorter since the surgery was performed through much smaller incisions than the traditional approach.  You should feel slightly better each day.  If you suddenly feel much worse than the prior day, please call the clinic.  It's important during the early part of your recovery that you maintain some activity.  Walking is encouraged.  You will quicken your recovery by continued activity.  Incision:  Your incisions will be closed with dissolvable stitches or surgical adhesive (glue).  There may be Band-aids and/or Steri-strips covering your incisions.  If there is no drainage from the incisions you may remove the Band-aids in one to two days.  You may notice some minor bruising at the incision sites.  This is common and will resolve within several days.  Please inform us if the redness at the edges of your incision appears to be spreading.  If the skin around your incision becomes warm to the touch, or if you notice a pus-like drainage, please call the office.  Vaginal Discharge Following a Laparoscopic Hysterectomy: Minor vaginal bleeding or spotting is normal following a hysterectomy.  Bleeding similar to the amount of your period is excessive, and you should inform us of this immediately.  Vaginal spotting may continue  for several weeks following your surgery.  You may notice a yellowish discharge which occasionally occurs as the vaginal stitches dissolve, and may last for several weeks.  Sexual Activity Following a Hysterectomy: Do not have sexual intercourse or place tampons or douches in the vagina prior to your first office visit.  We will discuss when you may resume these activities at that visit.    Stairs/Driving/Activities: You may climb stairs if necessary.  If you've had general anesthesia, do not drive a car the rest of the day today.  You may begin light housework when you feel up to it, but avoid heavy lifting (more than 15-20lbs) or pushing until cleared for these activities by your physician.  Hygiene:  Do not soak your incisions.  Showers are acceptable but you may not take a bath or swim in a pool.  Cleanse your incisions daily with soap and water.  Medications:  Please resume taking any medications that you were taking prior to the surgery.  If we have prescribed any new medications for you, please take them as directed.  Constipation:  It is fairly common to experience some difficulty in moving your bowels following major surgery.  Being active will help to reduce this likelihood. A diet rich in fiber and plenty of liquids is desirable.  If you do become constipated, a mild laxative such as Miralax, Milk of Magnesia, or Metamucil, or a stool softener such as Colace, is recommended.  General Instructions: If you develop a fever of 100.5 degrees or higher, please call the office number(s) below for physician on call.   

## 2019-12-19 NOTE — H&P (Signed)
Obstetrics & Gynecology Surgical H&P   Date of Surgery: 12/19/2019    Primary OBGYN: [facility]  Reason for Admission: scheduled hysterectomy  History of Present Illness: Judith Drake is a 49 y.o. [phone] (No LMP recorded.), with the above CC. PMHx is significant for HTN, GERD, h/o etoh abuse, anemia, BTL, BMI 30s.    ROS: A 12-point review of systems was performed and negative, except as stated in the above HPI.  OBGYN History: As per HPI. OB History  Gravida Para Term Preterm AB Living  2 2 2     2   SAB TAB Ectopic Multiple Live Births          2    # Outcome Date GA Lbr Len/2nd Weight Sex Delivery Anes PTL Lv  2 Term           1 Term             Obstetric Comments  Vaginal x 2     Past Medical History: Past Medical History:  Diagnosis Date  . Acute lower UTI 05/12/2019  . Acute respiratory failure with hypoxia ([place]) 04/10/2018  . Anemia   . Arthritis    lower back and hips  . Elevated liver enzymes 11/24/2013  . ETOH abuse   . Fatty liver 10/03/2019  . Fibroid   . GERD (gastroesophageal reflux disease)   . Heavy menstrual period   . Hypertension   . SIRS (systemic inflammatory response syndrome) ([place]) 05/12/2019    Past Surgical History: Past Surgical History:  Procedure Laterality Date  . ELBOW SURGERY     1997   . TUBAL LIGATION      Family History:  Family History  Problem Relation Age of Onset  . Cancer Mother 92       Lung   . Hyperlipidemia Mother   . Hypertension Mother   . Stroke Mother   . Kidney disease Mother   . Diabetes Mother   . Heart disease Mother   . Hyperlipidemia Father   . Autism Son   . Cancer Paternal Grandfather 65       Colon Cancer - died  . Breast cancer Neg Hx     Social History:  Social History   Socioeconomic History  . Marital status: Married    Spouse name: Lanny Hurst  . Number of children: 2  . Years of education: 89  . Highest education level: Not on file  Occupational History  .  Occupation: Homemaker   Tobacco Use  . Smoking status: Heavy Tobacco Smoker    Packs/day: 0.50    Types: Cigarettes  . Smokeless tobacco: Current User  Substance and Sexual Activity  . Alcohol use: Yes    Comment: occasionally  . Drug use: No  . Sexual activity: Yes    Partners: Male    Birth control/protection: Surgical  Other Topics Concern  . Not on file  Social History Narrative   Ciella grew up in [place]. She attended [employer] for a certificate in accounting. She currently lives a home with her husband and 2 boys. Her oldest son has autism. She enjoys going out with her family.    Social Determinants of Health   Financial Resource Strain:   . Difficulty of Paying Living Expenses:   Food Insecurity:   . Worried About Charity fundraiser in the Last Year:   . Arboriculturist in the Last Year:   Transportation Needs:   . Lack  of Transportation (Medical):   Marland Kitchen Lack of Transportation (Non-Medical):   Physical Activity:   . Days of Exercise per Week:   . Minutes of Exercise per Session:   Stress:   . Feeling of Stress :   Social Connections:   . Frequency of Communication with Friends and Family:   . Frequency of Social Gatherings with Friends and Family:   . Attends Religious Services:   . Active Member of Clubs or Organizations:   . Attends Archivist Meetings:   Marland Kitchen Marital Status:   Intimate Partner Violence:   . Fear of Current or Ex-Partner:   . Emotionally Abused:   Marland Kitchen Physically Abused:   . Sexually Abused:     Allergy: Allergies  Allergen Reactions  . Amoxicillin Nausea And Vomiting    Has patient had a PCN reaction causing immediate rash, facial/tongue/throat swelling, SOB or lightheadedness with hypotension: No Has patient had a PCN reaction causing severe rash involving mucus membranes or skin necrosis: No Has patient had a PCN reaction that required hospitalization: No Has patient had a PCN reaction occurring within the last  10 years: No If all of the above answers are "NO", then may proceed with Cephalosporin use.   Marland Kitchen Morphine And Related Itching    Current Outpatient Medications: Medications Prior to Admission  Medication Sig Dispense Refill Last Dose  . amLODipine (NORVASC) 10 MG tablet Take 1 tablet (10 mg total) by mouth daily. 30 tablet 5 12/19/2019 at 0700  . Aspirin-Caffeine 845-65 MG PACK Take 1 Package by mouth daily as needed (pain).     . busPIRone (BUSPAR) 10 MG tablet Take 1 tablet (10 mg total) by mouth 3 (three) times daily. (Patient taking differently: Take 10 mg by mouth 2 (two) times daily. ) 270 tablet 2 12/19/2019 at 0700  . megestrol (MEGACE) 40 MG tablet Take 1 tablet (40 mg total) by mouth 2 (two) times daily. 60 tablet 5 12/19/2019 at 0700  . Multiple Vitamins-Minerals (MULTIVITAMIN ADULT) TABS Take 1 tablet by mouth daily.     . potassium chloride SA (KLOR-CON) 20 MEQ tablet Take 2 tablets (40 mEq total) by mouth daily for 3 doses. 6 tablet 0 12/19/2019 at 0600     Hospital Medications: Current Facility-Administered Medications  Medication Dose Route Frequency Provider Last Rate Last Admin  . acetaminophen (TYLENOL) tablet 1,000 mg  1,000 mg Oral Once Finucane, Elizabeth M, DO      . ceFAZolin (ANCEF) 2-4 GM/100ML-% IVPB           . ceFAZolin (ANCEF) IVPB 2g/100 mL premix  2 g Intravenous On Call to OR Aletha Halim, MD      . lactated ringers infusion   Intravenous Continuous Aletha Halim, MD      . scopolamine (TRANSDERM-SCOP) 1 MG/3DAYS 1.5 mg  1 patch Transdermal Q72H Pervis Hocking, DO         Physical Exam:  Current Vital Signs 24h Vital Sign Ranges  T 98.8 F (37.1 C) Temp  Avg: 98.8 F (37.1 C)  Min: 98.8 F (37.1 C)  Max: 98.8 F (37.1 C)  BP 136/84 BP  Min: 136/84  Max: 136/84  HR 96 Pulse  Avg: 96  Min: 96  Max: 96  RR 20 Resp  Avg: 20  Min: 20  Max: 20  SaO2 98 % Room Air SpO2  Avg: 98 %  Min: 98 %  Max: 98 %       24 Hour I/O Current Shift  I/O   Time Ins Outs No intake/output data recorded. No intake/output data recorded.    Body mass index is 29.99 kg/m. General appearance: Well nourished, well developed female in no acute distress.  Cardiovascular: S1, S2 normal, no murmur, rub or gallop, regular rate and rhythm Respiratory:  Clear to auscultation bilateral. Normal respiratory effort Abdomen: positive bowel sounds and no masses, hernias; diffusely non tender to palpation, non distended Neuro/Psych:  Normal mood and affect.  Skin:  Warm and dry.  Extremities: no clubbing, cyanosis, or edema.    Laboratory: UPT: negative COVID: 4/9 Recent Labs  Lab 12/15/19 1115 12/19/19 1241  WBC 9.0  --   HGB 13.6 12.9  HCT 43.5 38.0  PLT 350  --    Recent Labs  Lab 12/15/19 1115 12/19/19 1241  NA 138 138  K 2.8* 3.8  CL 95* 103  CO2 28  --   BUN 9 6  CREATININE 0.88 0.60  CALCIUM 9.8  --   PROT 8.6*  --   BILITOT 1.0  --   ALKPHOS 70  --   ALT 45*  --   AST 64*  --   GLUCOSE 101* 107*   No results for input(s): APTT, INR, PTT in the last 168 hours.  Invalid input(s): DRHAPTT Recent Labs  Lab 12/15/19 1118  [place] A POS    Imaging:  CLINICAL DATA:  Vaginal bleeding for 8 weeks.  EXAM: TRANSABDOMINAL AND TRANSVAGINAL ULTRASOUND OF PELVIS  TECHNIQUE: Both transabdominal and transvaginal ultrasound examinations of the pelvis were performed. Transabdominal technique was performed for global imaging of the pelvis including uterus, ovaries, adnexal regions, and pelvic cul-de-sac. It was necessary to proceed with endovaginal exam following the transabdominal exam to visualize the uterus, endometrium and ovaries to better advantage.  COMPARISON:  None  FINDINGS: Uterus  Measurements: 11.5 x 7.8 x 8.0 cm = volume: 375 mL. No masses. Hypervascular on color Doppler analysis. Heterogeneous echogenicity. Several small cystic spaces noted mostly along the junctional zone.  Endometrium  Thickness:  1.9 cm.  No focal abnormality visualized.  Right ovary  Measurements: 3.9 x 2.9 x 2.3 cm = volume: 13.8 mL. Normal appearance/no adnexal mass.  Left ovary  Not visualized.  Other findings  No abnormal free fluid.  IMPRESSION: 1. Endometrium is thickened to 1.9 cm. No endometrial mass. Heterogeneous appearance of the uterus with small cystic spaces, but no defined mass. If bleeding remains unresponsive to hormonal or medical therapy, focal lesion work-up with sonohysterogram should be considered. Endometrial biopsy should also be considered in pre-menopausal patients at high risk for endometrial carcinoma. (Ref: Radiological Reasoning: Algorithmic Workup of Abnormal Vaginal Bleeding with Endovaginal Sonography and Sonohysterography. AJR 2008; [id number]) 2. No other abnormality.  Left ovary not visualized.   Electronically Signed   By: Lajean Manes M.D.   On: 03/18/2019 05:56  Assessment: Ms. Lord is a 49 y.o. [id number] (No LMP recorded.) here for scheduled hysterectomy  Plan: Pt doing well, having some bleeding currently but light. Pt consented for TLH/BS/cystoscopy; can proceed when OR is ready   Durene Romans MD Attending [facility] (Faculty Practice) [phone]

## 2019-12-19 NOTE — Brief Op Note (Signed)
12/19/2019  4:39 PM  PATIENT:  Judith Drake  49 y.o. female  PRE-OPERATIVE DIAGNOSIS:  AUB Fibroids  POST-OPERATIVE DIAGNOSIS:  AUB fibroids  PROCEDURE: Total laparoscopic hysterectomy, bilateral salpingectomy, cystoscopy  SURGEON:  Surgeon(s) and Role:    * Aletha Halim, MD - Primary  ASSISTANTS:    * Harolyn Rutherford, Sallyanne Havers, MD - Assisting  ANESTHESIA:   local and general  EBL:  100 mL   BLOOD ADMINISTERED:none  DRAINS: indwelling foley 131mL UOP   LOCAL MEDICATIONS USED:  MARCAINE     SPECIMEN:  Cervix, uterus, tubes  DISPOSITION OF SPECIMEN:  PATHOLOGY  COUNTS:  YES  TOURNIQUET:  * No tourniquets in log *  DICTATION: .Note written in EPIC  PLAN OF CARE: Admit for overnight observation  PATIENT DISPOSITION:  PACU - hemodynamically stable.   Delay start of Pharmacological VTE agent (>24hrs) due to surgical blood loss or risk of bleeding: not applicable  Durene Romans MD Attending [facility] (Faculty Practice) 12/19/2019 Time: [phone]

## 2019-12-19 NOTE — Transfer of Care (Signed)
Immediate Anesthesia Transfer of Care Note  Patient: Judith Drake  Procedure(s) Performed: TOTAL LAPAROSCOPIC HYSTERECTOMY WITH BILATERAL SALPINGECTOMY (Bilateral Abdomen) CYSTOSCOPY (N/A Bladder)  Patient Location: PACU  Anesthesia Type:General  Level of Consciousness: drowsy and patient cooperative  Airway & Oxygen Therapy: Patient Spontanous Breathing and Patient connected to face mask oxygen  Post-op Assessment: Report given to RN and Post -op Vital signs reviewed and stable  Post vital signs: Reviewed and stable  Last Vitals:  Vitals Value Taken Time  BP 124/77 12/19/19 1651  Temp    Pulse 102 12/19/19 1654  Resp 13 12/19/19 1654  SpO2 93 % 12/19/19 1654  Vitals shown include unvalidated device data.  Last Pain:  Vitals:   12/19/19 1228  PainSc: 0-No pain      Patients Stated Pain Goal: 3 ([id number])  Complications: No apparent anesthesia complications

## 2019-12-19 NOTE — Telephone Encounter (Signed)
Opened by mistake.

## 2019-12-19 NOTE — Anesthesia Procedure Notes (Signed)
Procedure Name: Intubation Date/Time: 12/19/2019 1:47 PM Performed by: Bryson Corona, CRNA Pre-anesthesia Checklist: Patient identified, Emergency Drugs available, Suction available and Patient being monitored Patient Re-evaluated:Patient Re-evaluated prior to induction Oxygen Delivery Method: Circle System Utilized Preoxygenation: Pre-oxygenation with 100% oxygen Induction Type: IV induction Ventilation: Oral airway inserted - appropriate to patient size and Two handed mask ventilation required Laryngoscope Size: Mac and 3 Grade View: Grade I Tube type: Oral Tube size: 7.0 mm Number of attempts: 1 Airway Equipment and Method: Stylet and Oral airway Placement Confirmation: ETT inserted through vocal cords under direct vision,  positive ETCO2 and breath sounds checked- equal and bilateral Secured at: 22 cm Tube secured with: Tape Dental Injury: Teeth and Oropharynx as per pre-operative assessment

## 2019-12-20 DIAGNOSIS — I10 Essential (primary) hypertension: Secondary | ICD-10-CM | POA: Diagnosis not present

## 2019-12-20 DIAGNOSIS — N920 Excessive and frequent menstruation with regular cycle: Secondary | ICD-10-CM | POA: Diagnosis not present

## 2019-12-20 DIAGNOSIS — M1990 Unspecified osteoarthritis, unspecified site: Secondary | ICD-10-CM | POA: Diagnosis not present

## 2019-12-20 DIAGNOSIS — D500 Iron deficiency anemia secondary to blood loss (chronic): Secondary | ICD-10-CM | POA: Diagnosis not present

## 2019-12-20 DIAGNOSIS — K219 Gastro-esophageal reflux disease without esophagitis: Secondary | ICD-10-CM | POA: Diagnosis not present

## 2019-12-20 DIAGNOSIS — N939 Abnormal uterine and vaginal bleeding, unspecified: Secondary | ICD-10-CM | POA: Diagnosis not present

## 2019-12-20 DIAGNOSIS — K760 Fatty (change of) liver, not elsewhere classified: Secondary | ICD-10-CM | POA: Diagnosis not present

## 2019-12-20 DIAGNOSIS — Z880 Allergy status to penicillin: Secondary | ICD-10-CM | POA: Diagnosis not present

## 2019-12-20 DIAGNOSIS — Z7982 Long term (current) use of aspirin: Secondary | ICD-10-CM | POA: Diagnosis not present

## 2019-12-20 DIAGNOSIS — F17210 Nicotine dependence, cigarettes, uncomplicated: Secondary | ICD-10-CM | POA: Diagnosis not present

## 2019-12-20 DIAGNOSIS — D259 Leiomyoma of uterus, unspecified: Secondary | ICD-10-CM | POA: Diagnosis not present

## 2019-12-20 DIAGNOSIS — R87612 Low grade squamous intraepithelial lesion on cytologic smear of cervix (LGSIL): Secondary | ICD-10-CM | POA: Diagnosis not present

## 2019-12-20 DIAGNOSIS — Z79899 Other long term (current) drug therapy: Secondary | ICD-10-CM | POA: Diagnosis not present

## 2019-12-20 DIAGNOSIS — Z885 Allergy status to narcotic agent status: Secondary | ICD-10-CM | POA: Diagnosis not present

## 2019-12-20 DIAGNOSIS — Z8249 Family history of ischemic heart disease and other diseases of the circulatory system: Secondary | ICD-10-CM | POA: Diagnosis not present

## 2019-12-20 DIAGNOSIS — N946 Dysmenorrhea, unspecified: Secondary | ICD-10-CM | POA: Diagnosis not present

## 2019-12-20 DIAGNOSIS — N800 Endometriosis of uterus: Secondary | ICD-10-CM | POA: Diagnosis not present

## 2019-12-20 DIAGNOSIS — N938 Other specified abnormal uterine and vaginal bleeding: Secondary | ICD-10-CM | POA: Diagnosis not present

## 2019-12-20 MED ORDER — DIPHENHYDRAMINE HCL 25 MG PO CAPS: 25.0000 mg | ORAL_CAPSULE | Freq: Four times a day (QID) | ORAL | 0 refills | Status: DC | PRN

## 2019-12-20 MED ORDER — DIPHENHYDRAMINE HCL 25 MG PO CAPS: 25.0000 mg | ORAL_CAPSULE | Freq: Four times a day (QID) | ORAL | Status: DC | PRN

## 2019-12-20 MED ORDER — DIPHENHYDRAMINE HCL 50 MG/ML IJ SOLN
12.5000 mg | Freq: Once | INTRAMUSCULAR | Status: AC
  Administered 2019-12-20: 12.5 mg via INTRAVENOUS
  Filled 2019-12-20: qty 1

## 2019-12-20 NOTE — Anesthesia Postprocedure Evaluation (Signed)
Anesthesia Post Note  Patient: Judith Drake  Procedure(s) Performed: TOTAL LAPAROSCOPIC HYSTERECTOMY WITH BILATERAL SALPINGECTOMY (Bilateral Abdomen) CYSTOSCOPY (N/A Bladder)     Patient location during evaluation: PACU Anesthesia Type: General Level of consciousness: awake and alert Pain management: pain level controlled Vital Signs Assessment: post-procedure vital signs reviewed and stable Respiratory status: spontaneous breathing, nonlabored ventilation, respiratory function stable and patient connected to nasal cannula oxygen Cardiovascular status: blood pressure returned to baseline and stable Postop Assessment: no apparent nausea or vomiting Anesthetic complications: no    Last Vitals:  Vitals:   12/20/19 0152 12/20/19 0459  BP: 109/78 136/85  Pulse: 84 95  Resp: 18 18  Temp: 36.8 C 37.1 C  SpO2: 98% 97%    Last Pain:  Vitals:   12/20/19 0719  TempSrc:   PainSc: 4                  [name] S

## 2019-12-20 NOTE — Progress Notes (Signed)
All DC instructions given and reviewed with pt. Pt is ready for DC awaiting pick up by husband.  No questions about home self care.

## 2019-12-20 NOTE — Discharge Summary (Signed)
Physician Discharge Summary  Patient ID: Judith Drake MRN: [id number] DOB/AGE: [birth year] 49 y.o.  Admit date: 12/19/2019 Discharge date: 12/20/2019  Admission Diagnoses:  Discharge Diagnoses:  Active Problems:   Abnormal uterine bleeding (AUB)   Status post hysterectomy   Discharged Condition: good  Hospital Course: Post-op day #1 from [facility] with salpingectomy by Dr Ilda Basset. Patient had uncomplicated postoperative course and is ready for discharge, meeting all postoperative goals. Will follow up in the office, as already arranged.  Consults: None  Significant Diagnostic Studies: none  Treatments: none  Discharge Exam: Blood pressure 136/85, pulse 95, temperature 98.8 F (37.1 C), temperature source Axillary, resp. rate 18, height 5\' 3"  (1.6 m), weight 78 kg, SpO2 97 %. General appearance: alert, cooperative and no distress Head: Normocephalic, without obvious abnormality, atraumatic Resp: normal resp effort GI: soft, non-tender; bowel sounds normal; no masses,  no organomegaly and mild postoperative tenderness. Operative sites clean, dry, intact. Extremities: extremities normal, atraumatic, no cyanosis or edema and Homans sign is negative, no sign of DVT Pulses: 2+ and symmetric  Disposition: Discharge disposition: 01-Home or Self Care        Allergies as of 12/20/2019      Reactions   Dilaudid [hydromorphone Hcl] Itching   Amoxicillin Nausea And Vomiting   Has patient had a PCN reaction causing immediate rash, facial/tongue/throat swelling, SOB or lightheadedness with hypotension: No Has patient had a PCN reaction causing severe rash involving mucus membranes or skin necrosis: No Has patient had a PCN reaction that required hospitalization: No Has patient had a PCN reaction occurring within the last 10 years: No If all of the above answers are "NO", then may proceed with Cephalosporin use.   Morphine And Related Itching      Medication List    STOP taking  these medications   megestrol 40 MG tablet Commonly known as: MEGACE     TAKE these medications   amLODipine 10 MG tablet Commonly known as: NORVASC Take 1 tablet (10 mg total) by mouth daily.   Aspirin-Caffeine 845-65 MG Pack Take 1 Package by mouth daily as needed (pain).   busPIRone 10 MG tablet Commonly known as: BUSPAR Take 1 tablet (10 mg total) by mouth 3 (three) times daily. What changed: when to take this   diphenhydrAMINE 25 mg capsule Commonly known as: BENADRYL Take 1 capsule (25 mg total) by mouth every 6 (six) hours as needed for itching.   ibuprofen 600 MG tablet Commonly known as: ADVIL Take 1 tablet (600 mg total) by mouth every 6 (six) hours as needed.   Multivitamin Adult Tabs Take 1 tablet by mouth daily.   oxyCODONE-acetaminophen 5-325 MG tablet Commonly known as: PERCOCET/ROXICET Take 1-2 tablets by mouth every 6 (six) hours as needed.   polyethylene glycol 17 g packet Commonly known as: MIRALAX / GLYCOLAX Take 17 g by mouth daily.   potassium chloride SA 20 MEQ tablet Commonly known as: KLOR-CON Take 2 tablets (40 mEq total) by mouth daily for 3 doses.   Simethicone 80 MG Tabs Take 1 tablet (80 mg total) by mouth 4 (four) times daily as needed.      Follow-up [facility]. Go in 6 week(s).   Specialty: Obstetrics and Gynecology Contact information: [address] [phone]          Signed: Truett Mainland 12/20/2019, 7:05 AM

## 2019-12-20 NOTE — Progress Notes (Signed)
Received pt alert and oriented x4. Pt ambulated to room. Oriented pt to room and use of call bell. Will monitor pt.

## 2019-12-21 LAB — SURGICAL PATHOLOGY

## 2019-12-21 LAB — POCT PREGNANCY, URINE: Preg Test, Ur: NEGATIVE

## 2019-12-22 NOTE — Op Note (Signed)
Operative Note   12/22/2019  PRE-OP DIAGNOSIS Menorrhagia Dysmenorrhea Abnormal uterine bleeding History of anemia needing blood transfusions   POST-OP DIAGNOSIS *Same  SURGEON: Surgeon(s) and Role:    * Aletha Halim, MD - Primary  ASSISTANT:    * Harolyn Rutherford, Sallyanne Havers, MD - Assisting  An experienced assistant was required given the standard of surgical care given the complexity of the case. This assistant was needed for exposure, dissection, suctioning, retraction, instrument exchange,, and for overall help during the procedure.   PROCEDURE: Total laparoscopic hysterectomy, bilateral salpingectomy, cystoscopy  ANESTHESIA: General and local  ESTIMATED BLOOD LOSS: 173mL  DRAINS: indwelling foley 100 UOP  TOTAL IV FLUIDS: per anesthesia note  VTE PROPHYLAXIS: SCDs to the bilateral lower extremities  ANTIBIOTICS: Two grams of Cefazolin were given. within 1 hour of skin incision  SPECIMENS: cervix, uterus, bilateral fallopian tubes  DISPOSITION: PACU - hemodynamically stable.  CONDITION: stable  COMPLICATIONS: None  FINDINGS: Normal EGBUS, vagina, cervix. On laparoscopy, no intra-abdominal adhesions noted. Normal liver, stomach edge; normal ovaries and tubes. Grossly normal uterus that appeared and felt globular and enlarged.   Normal bladder dome and bilateral ureteral orifices identified with +jets of yellow urine and dye.DESCRIPTION OF PROCEDURE: After informed consent was obtained, the patient was taken to the operating room where anesthesia was obtained without difficulty. The patient was positioned in the dorsal lithotomy position in [place] and her arms were carefully tucked at her sides and the usual precautions were taken.  She was prepped and draped in normal sterile fashion.  Time-out was performed and a Foley catheter was placed into the bladder. A Large VCare uterine manipulator was then placed in the uterus without incident. Gloves were then changed, and  after injection of local anesthesia, the open technique was used to place an infraumbilical [id number] baloon trocar under direct visualization. The laparoscope was introduced and CO2 gas was infused for pneumoperitoneum to a pressure of 15 mm Hg and the area below inspected for injury.  The patient was placed in Trendelenburg and the bowel was displaced up into the upper abdomen, and the right and left lateral 5-mm ports and an 11 mm suprapubic port were placed under direct visualization of the laparoscope, after injection of local anesthesia.    The bilateral cornua were then cauterized with the Ligasure device, and the round ligaments were divided on each side with the monopolar scissors and the retroperitoneal space was opened bilaterally.  A bladder flap was created and the bladder was dissected down off the lower uterine segment and cervix using endoshears and electrocautery.  The broad ligaments were then opened, and the uterine arteries were skeletonized bilaterally, sealed and divided with the Ligasure device.  A colpotomy was performed circumferentially along the ring with electrocautery and the cervix was incised from the vagina and the specimen was removed through the vagina.  A pneumo balloon was placed in the vagina and the vaginal cuff was then closed in a running continuous fashion using the EndoStitch technique with 2-0 V-Lock suture with careful attention to include the vaginal cuff angles and the vaginal mucosa within the closure.  IV Sodium Fluorescein was then given. The bilateral tubes were then cauterized, transected and separated from the ovaries, with the Ligasure device and removed; the cystoscopy was then performed, with the above noted findings. The intraperitoneal pressure was dropped, and all planes of dissection, vascular pedicles and the vaginal cuff were found to be hemostatic.    The suprapubic trocar was removed and  the fascia was closed with 0 vicryl suture using the Endostitch  technique. The lateral trocars were removed under visualization.   Before the umbilical trocar was removed the CO2 gas was released.  The fascia there was closed with 0 vicryl suture in a figure of eight fashion.   The skin incision at the umbilicus was closed with a subcuticular stitch of 4-0 monocryl.  The remaining skin incisions were closed with Indermil glue.  The patient tolerated the procedure well.  Sponge, lap and needle counts were correct x2.  The patient was taken to recovery room in excellent condition.  Durene Romans MD Attending [facility] Fish farm manager)

## 2019-12-28 ENCOUNTER — Encounter: Payer: Self-pay | Admitting: Obstetrics and Gynecology

## 2019-12-28 DIAGNOSIS — N870 Mild cervical dysplasia: Secondary | ICD-10-CM | POA: Insufficient documentation

## 2020-01-17 ENCOUNTER — Other Ambulatory Visit: Payer: Self-pay | Admitting: Family

## 2020-01-17 DIAGNOSIS — I10 Essential (primary) hypertension: Secondary | ICD-10-CM

## 2020-02-01 ENCOUNTER — Encounter: Payer: BLUE CROSS/BLUE SHIELD | Admitting: Obstetrics and Gynecology

## 2020-02-15 ENCOUNTER — Ambulatory Visit (INDEPENDENT_AMBULATORY_CARE_PROVIDER_SITE_OTHER): Payer: BLUE CROSS/BLUE SHIELD | Admitting: Obstetrics and Gynecology

## 2020-02-15 ENCOUNTER — Other Ambulatory Visit: Payer: Self-pay

## 2020-02-15 ENCOUNTER — Encounter: Payer: Self-pay | Admitting: Obstetrics and Gynecology

## 2020-02-15 VITALS — BP 132/87 | HR 118 | Wt 166.0 lb

## 2020-02-15 DIAGNOSIS — N870 Mild cervical dysplasia: Secondary | ICD-10-CM

## 2020-02-15 DIAGNOSIS — Z09 Encounter for follow-up examination after completed treatment for conditions other than malignant neoplasm: Secondary | ICD-10-CM

## 2020-02-15 DIAGNOSIS — B373 Candidiasis of vulva and vagina: Secondary | ICD-10-CM

## 2020-02-15 DIAGNOSIS — B3731 Acute candidiasis of vulva and vagina: Secondary | ICD-10-CM

## 2020-02-15 DIAGNOSIS — N760 Acute vaginitis: Secondary | ICD-10-CM

## 2020-02-15 DIAGNOSIS — B9689 Other specified bacterial agents as the cause of diseases classified elsewhere: Secondary | ICD-10-CM

## 2020-02-15 MED ORDER — METRONIDAZOLE 500 MG PO TABS: 500.0000 mg | ORAL_TABLET | Freq: Two times a day (BID) | ORAL | 0 refills | Status: AC

## 2020-02-15 MED ORDER — FLUCONAZOLE 150 MG PO TABS: 150.0000 mg | ORAL_TABLET | Freq: Once | ORAL | 0 refills | Status: AC

## 2020-02-15 NOTE — Progress Notes (Signed)
Obstetrics and Gynecology Visit Return Patient Evaluation  Appointment Date: 02/15/2020  Primary Care Provider: Arnett, [place] for [facility]  Chief Complaint: regular post op visit  History of Present Illness:  Judith Drake is a 49 y.o. 4/13 TLH/BS/cysto for AUB. Patient discharged to home on POD#1.   No bleeding, pain. She has had sex once and no issues. She is having some irritation and ? Dryness.    Review of Systems:  as noted in the History of Present Illness.  Medications:  Lydiann Bonifas. Lindo had no medications administered during this visit. Current Outpatient Medications  Medication Sig Dispense Refill  . amLODipine (NORVASC) 10 MG tablet TAKE 1 TABLET BY MOUTH ONCE DAILY 30 tablet 5  . Aspirin-Caffeine 845-65 MG PACK Take 1 Package by mouth daily as needed (pain).    . busPIRone (BUSPAR) 10 MG tablet Take 1 tablet (10 mg total) by mouth 3 (three) times daily. (Patient taking differently: Take 10 mg by mouth 2 (two) times daily. ) 270 tablet 2  . diphenhydrAMINE (BENADRYL) 25 mg capsule Take 1 capsule (25 mg total) by mouth every 6 (six) hours as needed for itching. 30 capsule 0  . fluconazole (DIFLUCAN) 150 MG tablet Take 1 tablet (150 mg total) by mouth once for 1 dose. 1 tablet 0  . ibuprofen (ADVIL) 600 MG tablet Take 1 tablet (600 mg total) by mouth every 6 (six) hours as needed. 30 tablet 0  . metroNIDAZOLE (FLAGYL) 500 MG tablet Take 1 tablet (500 mg total) by mouth 2 (two) times daily for 7 days. 14 tablet 0  . Multiple Vitamins-Minerals (MULTIVITAMIN ADULT) TABS Take 1 tablet by mouth daily.    Marland Kitchen oxyCODONE-acetaminophen (PERCOCET/ROXICET) 5-325 MG tablet Take 1-2 tablets by mouth every 6 (six) hours as needed. 20 tablet 0  . polyethylene glycol (MIRALAX / GLYCOLAX) 17 g packet Take 17 g by mouth daily. 14 each 0  . potassium chloride SA (KLOR-CON) 20 MEQ tablet Take 2 tablets (40 mEq total) by mouth daily for 3  doses. 6 tablet 0  . Simethicone 80 MG TABS Take 1 tablet (80 mg total) by mouth 4 (four) times daily as needed. 30 tablet 0   No current facility-administered medications for this visit.    Allergies: is allergic to dilaudid [hydromorphone hcl], amoxicillin, and morphine and related.  Physical Exam:  BP 132/87   Pulse (!) 118   Wt 166 lb (75.3 kg)   LMP  (LMP Unknown)   BMI 29.41 kg/m  Body mass index is 29.41 kg/m. General appearance: Well nourished, well developed female in no acute distress.  Abdomen: diffusely non tender to palpation, non distended, and no masses, hernias. Well healed port sites Neuro/Psych:  Normal mood and affect.    Pelvic exam:  EGBUS, vaginal vault and cuff: labia erythema and white cottage cheese like d/c in vault.   Assessment: pt doing well. +BV, yeast   Plan:  1. Vulvovaginal candidiasis Flagyl and diflucan  2. BV (bacterial vaginosis)  3. Postop check Routine care  4. Dysplasia of cervix, low grade (CIN 1) Recommended pap next year and if normal can do normal q26yr checks   RTC: 1 year.   Durene Romans MD Attending Center for [employer] manager)

## 2020-06-04 ENCOUNTER — Other Ambulatory Visit: Payer: Self-pay | Admitting: Family

## 2020-06-04 ENCOUNTER — Other Ambulatory Visit: Payer: Self-pay

## 2020-06-04 DIAGNOSIS — I10 Essential (primary) hypertension: Secondary | ICD-10-CM

## 2020-06-04 DIAGNOSIS — F411 Generalized anxiety disorder: Secondary | ICD-10-CM

## 2020-06-04 MED ORDER — BUSPIRONE HCL 10 MG PO TABS: 10.0000 mg | ORAL_TABLET | Freq: Three times a day (TID) | ORAL | 0 refills | Status: DC

## 2020-07-18 ENCOUNTER — Other Ambulatory Visit: Payer: Self-pay

## 2020-07-19 ENCOUNTER — Ambulatory Visit (INDEPENDENT_AMBULATORY_CARE_PROVIDER_SITE_OTHER): Payer: BC Managed Care – PPO | Admitting: Family

## 2020-07-19 ENCOUNTER — Encounter: Payer: Self-pay | Admitting: Family

## 2020-07-19 VITALS — BP 130/90 | HR 96 | Temp 98.4°F | Ht 64.0 in | Wt 166.4 lb

## 2020-07-19 DIAGNOSIS — M79641 Pain in right hand: Secondary | ICD-10-CM

## 2020-07-19 DIAGNOSIS — M79642 Pain in left hand: Secondary | ICD-10-CM

## 2020-07-19 DIAGNOSIS — M79643 Pain in unspecified hand: Secondary | ICD-10-CM | POA: Insufficient documentation

## 2020-07-19 DIAGNOSIS — I10 Essential (primary) hypertension: Secondary | ICD-10-CM

## 2020-07-19 DIAGNOSIS — F411 Generalized anxiety disorder: Secondary | ICD-10-CM

## 2020-07-19 MED ORDER — SERTRALINE HCL 50 MG PO TABS: 50.0000 mg | ORAL_TABLET | Freq: Every day | ORAL | 3 refills | Status: DC

## 2020-07-19 NOTE — Assessment & Plan Note (Addendum)
Uncontrolled. Suspect anxiety over children affecting sleep and exacerbating fatigue. Start zoloft 50mg . Continue buspar 5mg  TID. Pending thorough lab evaluation as well.

## 2020-07-19 NOTE — Progress Notes (Signed)
Subjective:    Patient ID: Judith Drake, female    DOB: 07/12/1971, 49 y.o.   MRN: [id number]  CC: Judith Drake is a 49 y.o. female who presents today for follow up.   HPI: Complains of hand cramping and stiffness after holding steering wheel or cutting potatoes.  No swelling, erythema, increased heat. Takes BC powder daily for HA, joint pain.   Increased anxiety due to family stress and trouble sleeping. Endorses fatigue. GAD- buspar 5mg  BID to TID with temporary relief.   No SI/HI  H/o anemia  S/p hysterectomy   HTN- compliant with amlodipine 10mg . No cp, sob.         HISTORY:  Past Medical History:  Diagnosis Date  . Abnormal uterine bleeding (AUB) 03/20/2019  . Acute lower UTI 05/12/2019  . Acute respiratory failure with hypoxia ([place]) 04/10/2018  . Anemia   . Arthritis    lower back and hips  . Dysmenorrhea 03/20/2019  . Elevated liver enzymes 11/24/2013  . ETOH abuse   . Fatty liver 10/03/2019  . Fibroid   . GERD (gastroesophageal reflux disease)   . Heavy menstrual period   . Hypertension   . Menorrhagia 12/13/2017  . SIRS (systemic inflammatory response syndrome) ([place]) 05/12/2019   Past Surgical History:  Procedure Laterality Date  . CYSTOSCOPY N/A 12/19/2019   Procedure: CYSTOSCOPY;  Surgeon: Aletha Halim, MD;  Location: [facility];  Service: Gynecology;  Laterality: N/A;  . ELBOW SURGERY     1997   . TOTAL LAPAROSCOPIC HYSTERECTOMY WITH SALPINGECTOMY Bilateral 12/19/2019   Procedure: TOTAL LAPAROSCOPIC HYSTERECTOMY WITH BILATERAL SALPINGECTOMY;  Surgeon: Aletha Halim, MD;  Location: [facility];  Service: Gynecology;  Laterality: Bilateral;  . TUBAL LIGATION     Family History  Problem Relation Age of Onset  . Cancer Mother 93       Lung   . Hyperlipidemia Mother   . Hypertension Mother   . Stroke Mother   . Kidney disease Mother   . Diabetes Mother   . Heart disease Mother   . Hyperlipidemia Father   . Autism Son   . Cancer Paternal  Grandfather 21       Colon Cancer - died  . Breast cancer Neg Hx     Allergies: Dilaudid [hydromorphone hcl], Amoxicillin, and Morphine and related Current Outpatient Medications on File Prior to Visit  Medication Sig Dispense Refill  . amLODipine (NORVASC) 10 MG tablet TAKE 1 TABLET BY MOUTH ONCE DAILY 30 tablet 5  . Aspirin-Caffeine 845-65 MG PACK Take 1 Package by mouth daily as needed (pain).    . busPIRone (BUSPAR) 10 MG tablet Take 1 tablet (10 mg total) by mouth 3 (three) times daily. 180 tablet 0   No current facility-administered medications on file prior to visit.    Social History   Tobacco Use  . Smoking status: Heavy Tobacco Smoker    Packs/day: 0.50    Types: Cigarettes  . Smokeless tobacco: Current User  Vaping Use  . Vaping Use: Never used  Substance Use Topics  . Alcohol use: Yes    Comment: occasionally  . Drug use: No    Review of Systems  Constitutional: Negative for chills and fever.  Respiratory: Negative for cough.   Cardiovascular: Negative for chest pain and palpitations.  Gastrointestinal: Negative for nausea and vomiting.  Musculoskeletal: Positive for arthralgias. Negative for joint swelling.  Skin: Negative for rash.  Psychiatric/Behavioral: Positive for sleep disturbance. Negative for suicidal ideas. The patient is  nervous/anxious.       Objective:    BP 130/90   Pulse 96   Temp 98.4 F (36.9 C)   Ht 5\' 4"  (1.626 m)   Wt 166 lb 6.4 oz (75.5 kg)   LMP  (LMP Unknown)   SpO2 98%   BMI 28.56 kg/m  BP Readings from Last 3 Encounters:  07/19/20 130/90  02/15/20 132/87  12/20/19 127/81   Wt Readings from Last 3 Encounters:  07/19/20 166 lb 6.4 oz (75.5 kg)  02/15/20 166 lb (75.3 kg)  12/19/19 171 lb 15.3 oz (78 kg)    Physical Exam Vitals reviewed.  Constitutional:      Appearance: She is well-developed.  Eyes:     Conjunctiva/sclera: Conjunctivae normal.  Cardiovascular:     Rate and Rhythm: Normal rate and regular rhythm.       Pulses: Normal pulses.     Heart sounds: Normal heart sounds.  Pulmonary:     Effort: Pulmonary effort is normal.     Breath sounds: Normal breath sounds. No wheezing, rhonchi or rales.  Musculoskeletal:     Right hand: Normal. No swelling or bony tenderness. Normal range of motion.     Left hand: Normal. No swelling or bony tenderness. Normal range of motion.  Skin:    General: Skin is warm and dry.  Neurological:     Mental Status: She is alert.  Psychiatric:        Speech: Speech normal.        Behavior: Behavior normal.        Thought Content: Thought content normal.        Assessment & Plan:   Problem List Items Addressed This Visit      Cardiovascular and Mediastinum   HTN (hypertension) - Primary    Uncontrolled. Advised patient to stop using daily NSAIDs and opt for topical therapy. Continue amlodipine 10mg . Patient will monitor blood pressure at home. Close follow up.       Relevant Orders   CYCLIC CITRUL PEPTIDE ANTIBODY, IGG/IGA   TSH   CBC with Differential/Platelet   Comprehensive metabolic panel   Hemoglobin A1c   Lipid panel   VITAMIN D 25 Hydroxy (Vit-D Deficiency, Fractures)   B12 and Folate Panel   Uric acid   ANA   C-reactive protein   Rheumatoid factor   Sedimentation rate   Iron, TIBC and Ferritin Panel     Other   GAD (generalized anxiety disorder)    Uncontrolled. Suspect anxiety over children affecting sleep and exacerbating fatigue. Start zoloft 50mg . Continue buspar 5mg  TID. Pending thorough lab evaluation as well.       Relevant Medications   sertraline (ZOLOFT) 50 MG tablet   Hand pain    Benign exam. Pending thorough lab evaluation. Close follow up.           I have discontinued [name]. Mells's Multivitamin Adult, potassium chloride SA, ibuprofen, polyethylene glycol, Simethicone, oxyCODONE-acetaminophen, and diphenhydrAMINE. I am also having her start on sertraline. Additionally, I am having her maintain her  Aspirin-Caffeine, amLODipine, and busPIRone.   Meds ordered this encounter  Medications  . sertraline (ZOLOFT) 50 MG tablet    Sig: Take 1 tablet (50 mg total) by mouth at bedtime.    Dispense:  90 tablet    Refill:  3    Order Specific Question:   Supervising Provider    Answer:   Crecencio Mc [[id number]]    Return precautions given.   Risks,  benefits, and alternatives of the medications and treatment plan prescribed today were discussed, and patient expressed understanding.   Education regarding symptom management and diagnosis given to patient on AVS.  Continue to follow with Burnard Hawthorne, FNP for routine health maintenance.   Regan Lemming and I agreed with plan.   Mable Paris, FNP

## 2020-07-19 NOTE — Assessment & Plan Note (Signed)
Uncontrolled. Advised patient to stop using daily NSAIDs and opt for topical therapy. Continue amlodipine 10mg . Patient will monitor blood pressure at home. Close follow up.

## 2020-07-19 NOTE — Patient Instructions (Addendum)
Stop ibuprofen as concerned it is raising blood pressure. Opt for topical agents, like voltaren gel  Or salon pas pain patch and HEAT.   It is imperative that you are seen AT least twice per year for labs and monitoring. Monitor blood pressure at home and me 5-6 reading on separate days. Goal is less than 120/80, based on newest guidelines, however we certainly want to be less than 130/80;  if persistently higher, please make sooner follow up appointment so we can recheck you blood pressure and manage/ adjust medications.   Start zoloft  Our hope is for gradual improvement of mood since starting medication; however this may take several weeks.   If you start to have unusual thoughts, thoughts of hurting yourself, or anyone else, please go immediately to the emergency department.   Follow up in 6 weeks.   Please text to [phone] and write the word 'home'. This will put you in touch with trained crisis counselor and resources.    National Suicide Prevention Hotline - available 24 hours a day, 7 days a week.  [phone]

## 2020-07-19 NOTE — Assessment & Plan Note (Signed)
Benign exam. Pending thorough lab evaluation. Close follow up.

## 2020-07-22 LAB — IRON,TIBC AND FERRITIN PANEL
%SAT: 10 % (calc) — ABNORMAL LOW (ref 16–45)
Ferritin: 16 ng/mL (ref 16–232)
Iron: 55 ug/dL (ref 40–190)
TIBC: 546 mcg/dL (calc) — ABNORMAL HIGH (ref 250–450)

## 2020-07-22 LAB — COMPREHENSIVE METABOLIC PANEL
AG Ratio: 1.4 (calc) (ref 1.0–2.5)
ALT: 32 U/L — ABNORMAL HIGH (ref 6–29)
AST: 56 U/L — ABNORMAL HIGH (ref 10–35)
Albumin: 4.7 g/dL (ref 3.6–5.1)
Alkaline phosphatase (APISO): 108 U/L (ref 31–125)
BUN: 7 mg/dL (ref 7–25)
CO2: 25 mmol/L (ref 20–32)
Calcium: 9.7 mg/dL (ref 8.6–10.2)
Chloride: 94 mmol/L — ABNORMAL LOW (ref 98–110)
Creat: 0.63 mg/dL (ref 0.50–1.10)
Globulin: 3.4 g/dL (calc) (ref 1.9–3.7)
Glucose, Bld: 99 mg/dL (ref 65–99)
Potassium: 3.5 mmol/L (ref 3.5–5.3)
Sodium: 137 mmol/L (ref 135–146)
Total Bilirubin: 0.8 mg/dL (ref 0.2–1.2)
Total Protein: 8.1 g/dL (ref 6.1–8.1)

## 2020-07-22 LAB — LIPID PANEL
Cholesterol: 238 mg/dL — ABNORMAL HIGH (ref <200)
HDL: 58 mg/dL (ref >=50)
LDL Cholesterol (Calc): 148 mg/dL (calc) — ABNORMAL HIGH
Non-HDL Cholesterol (Calc): 180 mg/dL (calc) — ABNORMAL HIGH (ref <130)
Total CHOL/HDL Ratio: 4.1 (calc) (ref <5.0)
Triglycerides: 185 mg/dL — ABNORMAL HIGH (ref <150)

## 2020-07-22 LAB — CBC WITH DIFFERENTIAL/PLATELET
Absolute Monocytes: 714 cells/uL (ref 200–950)
Basophils Absolute: 69 cells/uL (ref 0–200)
Basophils Relative: 0.8 %
Eosinophils Absolute: 155 cells/uL (ref 15–500)
Eosinophils Relative: 1.8 %
HCT: 40.4 % (ref 35.0–45.0)
Hemoglobin: 13 g/dL (ref 11.7–15.5)
Lymphs Abs: 1789 cells/uL (ref 850–3900)
MCH: 26.1 pg — ABNORMAL LOW (ref 27.0–33.0)
MCHC: 32.2 g/dL (ref 32.0–36.0)
MCV: 81 fL (ref 80.0–100.0)
MPV: 10.6 fL (ref 7.5–12.5)
Monocytes Relative: 8.3 %
Neutro Abs: 5874 cells/uL (ref 1500–7800)
Neutrophils Relative %: 68.3 %
Platelets: 381 10*3/uL (ref 140–400)
RBC: 4.99 10*6/uL (ref 3.80–5.10)
RDW: 14 % (ref 11.0–15.0)
Total Lymphocyte: 20.8 %
WBC: 8.6 10*3/uL (ref 3.8–10.8)

## 2020-07-22 LAB — VITAMIN D 25 HYDROXY (VIT D DEFICIENCY, FRACTURES): Vit D, 25-Hydroxy: 16 ng/mL — ABNORMAL LOW (ref 30–100)

## 2020-07-22 LAB — HEMOGLOBIN A1C
Hgb A1c MFr Bld: 5.5 % of total Hgb (ref <5.7)
Mean Plasma Glucose: 111 (calc)
eAG (mmol/L): 6.2 (calc)

## 2020-07-22 LAB — B12 AND FOLATE PANEL
Folate: 10.6 ng/mL
Vitamin B-12: 383 pg/mL (ref 200–1100)

## 2020-07-22 LAB — ANA: Anti Nuclear Antibody (ANA): NEGATIVE

## 2020-07-22 LAB — TSH: TSH: 3.64 mIU/L

## 2020-07-22 LAB — SEDIMENTATION RATE: Sed Rate: 31 mm/h — ABNORMAL HIGH (ref 0–20)

## 2020-07-22 LAB — C-REACTIVE PROTEIN: CRP: 10.3 mg/L — ABNORMAL HIGH (ref <8.0)

## 2020-07-22 LAB — URIC ACID: Uric Acid, Serum: 5.3 mg/dL (ref 2.5–7.0)

## 2020-07-22 LAB — RHEUMATOID FACTOR: Rheumatoid fact SerPl-aCnc: 14 IU/mL — ABNORMAL HIGH (ref <14)

## 2020-07-22 LAB — CYCLIC CITRUL PEPTIDE ANTIBODY, IGG/IGA: Cyclic Citrullin Peptide Ab: 7 units (ref 0–19)

## 2020-07-24 ENCOUNTER — Other Ambulatory Visit: Payer: Self-pay | Admitting: Family

## 2020-07-24 DIAGNOSIS — M79642 Pain in left hand: Secondary | ICD-10-CM

## 2020-07-24 DIAGNOSIS — M79641 Pain in right hand: Secondary | ICD-10-CM

## 2020-07-24 DIAGNOSIS — R7989 Other specified abnormal findings of blood chemistry: Secondary | ICD-10-CM

## 2020-09-04 ENCOUNTER — Ambulatory Visit: Payer: BC Managed Care – PPO | Admitting: Family

## 2020-10-15 ENCOUNTER — Other Ambulatory Visit: Payer: Self-pay | Admitting: Family

## 2020-10-15 DIAGNOSIS — F411 Generalized anxiety disorder: Secondary | ICD-10-CM

## 2020-10-18 NOTE — Progress Notes (Signed)
   I, Wendy Poet, LAT, ATC, am serving as scribe for Dr. [name].  Subjective:    CC: Low back pain and R?L hip pain  HPI: Pt is a 50 y/o female c/o chronic low back pain and L hip pain that has been flared up since x 3 weeks w/ no known MOI.   Pt locates pain to her L buttocks w/ radiaitng pain down the lateral L leg to the top of her L foot. Pt was previously seen by Dr. Tamala Julian on 03/28/18 for back pain and SI joint arthritis.   Radiating pain: yes down her L lateral leg to her foot LE numbness/tingling: yes in her L foot LE weakness: No Aggravates: sitting; lumbar flexion; stepping the wrong way or turning the wrong way Rx tried: [facility] powder  Dx imaging: 02/08/18 L-spine XR & Pelvis XR  Pertinent review of Systems: No fevers or chills  Relevant historical information: Hypertension, tobacco abuse.  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   Objective:    Vitals:   10/21/20 1429  BP: 124/86  Pulse: (!) 112  SpO2: 97%   General: Well Developed, well nourished, and in no acute distress.   MSK: L-spine normal-appearing Nontender midline and paraspinal musculature.  Decreased lumbar motion especially to flexion. Positive left-sided slump test. Lower extremity strength is intact with the exception of left great toe dorsiflexion which is slightly limited 4+/5 compared to right.  Otherwise intact. Reflexes and sensation are intact throughout bilateral extremities.  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  Facet DJD L5-S1 no fracture or malalignment. Await formal radiology review   Impression and Recommendations:    Assessment and Plan: 50 y.o. female with lumbar radiculopathy at left L5.  New issue ongoing for approximately 3 weeks now. Plan for course of prednisone and gabapentin.  If not improving or if worsening proceed with lumbar MRI.  Patient does  have positive slump test and minimal weakness at L5 dermatomal pattern.  PDMP not reviewed this encounter. Orders Placed This Encounter  Procedures  . DG Lumbar Spine 2-3 Views    Standing Status:   Future    Number of Occurrences:   1    Standing Expiration Date:   10/21/2021    Order Specific Question:   Reason for Exam (SYMPTOM  OR DIAGNOSIS REQUIRED)    Answer:   eval left l5 rad    Order Specific Question:   Is patient pregnant?    Answer:   No    Order Specific Question:   Preferred imaging location?    Answer:   [facility]   Meds ordered this encounter  Medications  . predniSONE (DELTASONE) 50 MG tablet    Sig: Take 1 tablet (50 mg total) by mouth daily.    Dispense:  5 tablet    Refill:  0  . gabapentin (NEURONTIN) 300 MG capsule    Sig: Take 1 capsule (300 mg total) by mouth 3 (three) times daily as needed.    Dispense:  90 capsule    Refill:  3    Discussed warning signs or symptoms. Please see discharge instructions. Patient expresses understanding.   The above documentation has been reviewed and is accurate and complete [name], M.D.

## 2020-10-21 ENCOUNTER — Ambulatory Visit: Payer: BC Managed Care – PPO | Admitting: Family Medicine

## 2020-10-21 ENCOUNTER — Ambulatory Visit (INDEPENDENT_AMBULATORY_CARE_PROVIDER_SITE_OTHER): Payer: BC Managed Care – PPO

## 2020-10-21 ENCOUNTER — Other Ambulatory Visit: Payer: Self-pay

## 2020-10-21 ENCOUNTER — Encounter: Payer: Self-pay | Admitting: Family Medicine

## 2020-10-21 VITALS — BP 124/86 | HR 112 | Ht 64.0 in | Wt 161.4 lb

## 2020-10-21 DIAGNOSIS — M545 Low back pain, unspecified: Secondary | ICD-10-CM | POA: Diagnosis not present

## 2020-10-21 DIAGNOSIS — M5416 Radiculopathy, lumbar region: Secondary | ICD-10-CM

## 2020-10-21 IMAGING — DX DG LUMBAR SPINE 2-3V
3 series · 3 of 3 positions shown · non-contrast
Comparison: Lumbar radiographs [DATE].  CT scan [DATE]

CLINICAL DATA: Low back and left leg pain for 3 weeks.

EXAM:
LUMBAR SPINE - 2-3 VIEW

[l-spine ap]
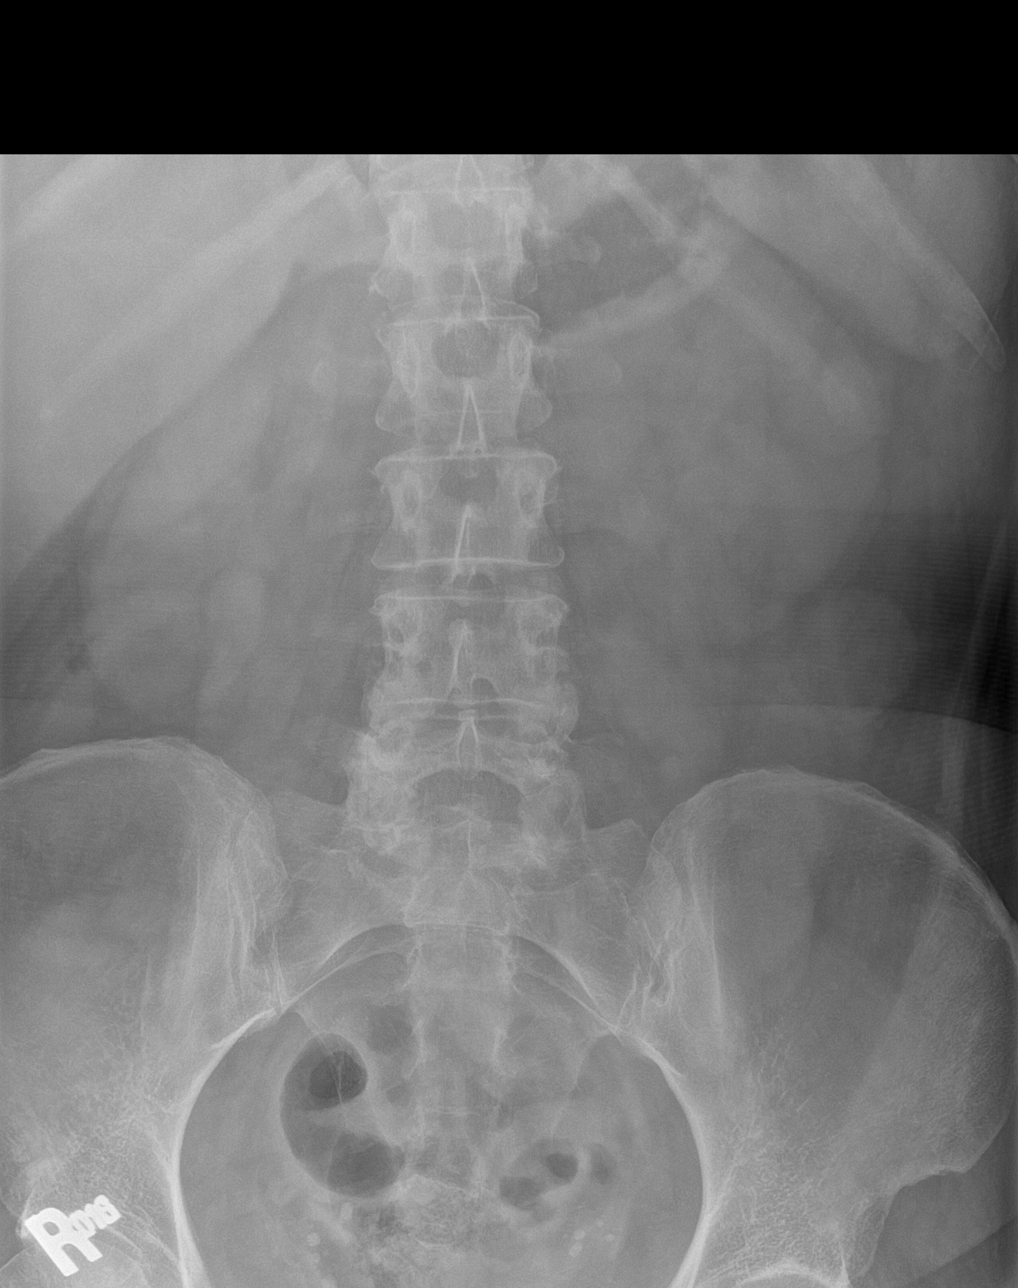

[l-spine lateral (1 of 2)]
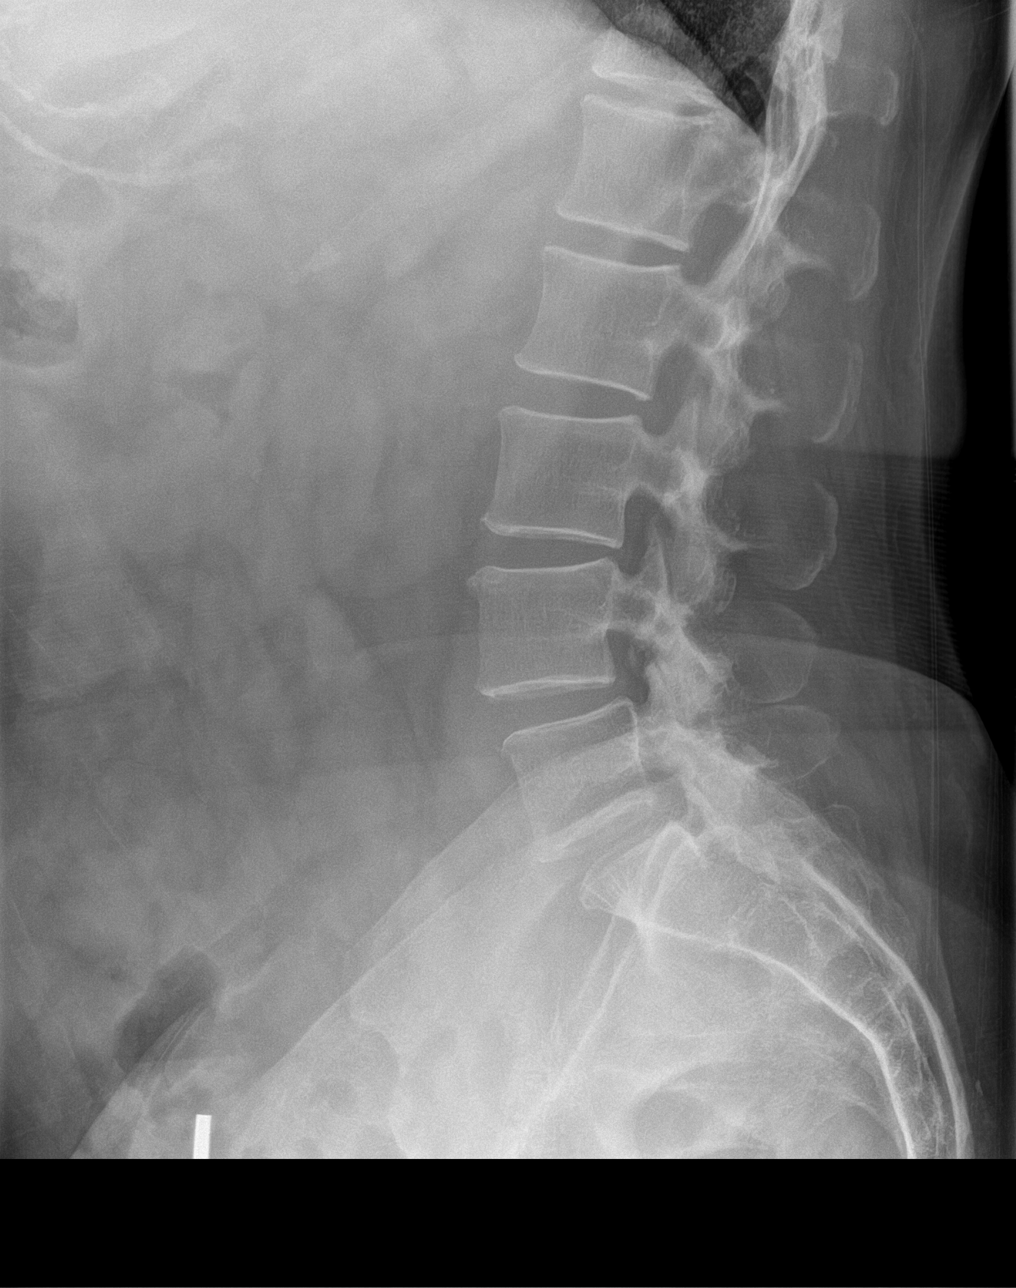

[l-spine lateral (2 of 2)]
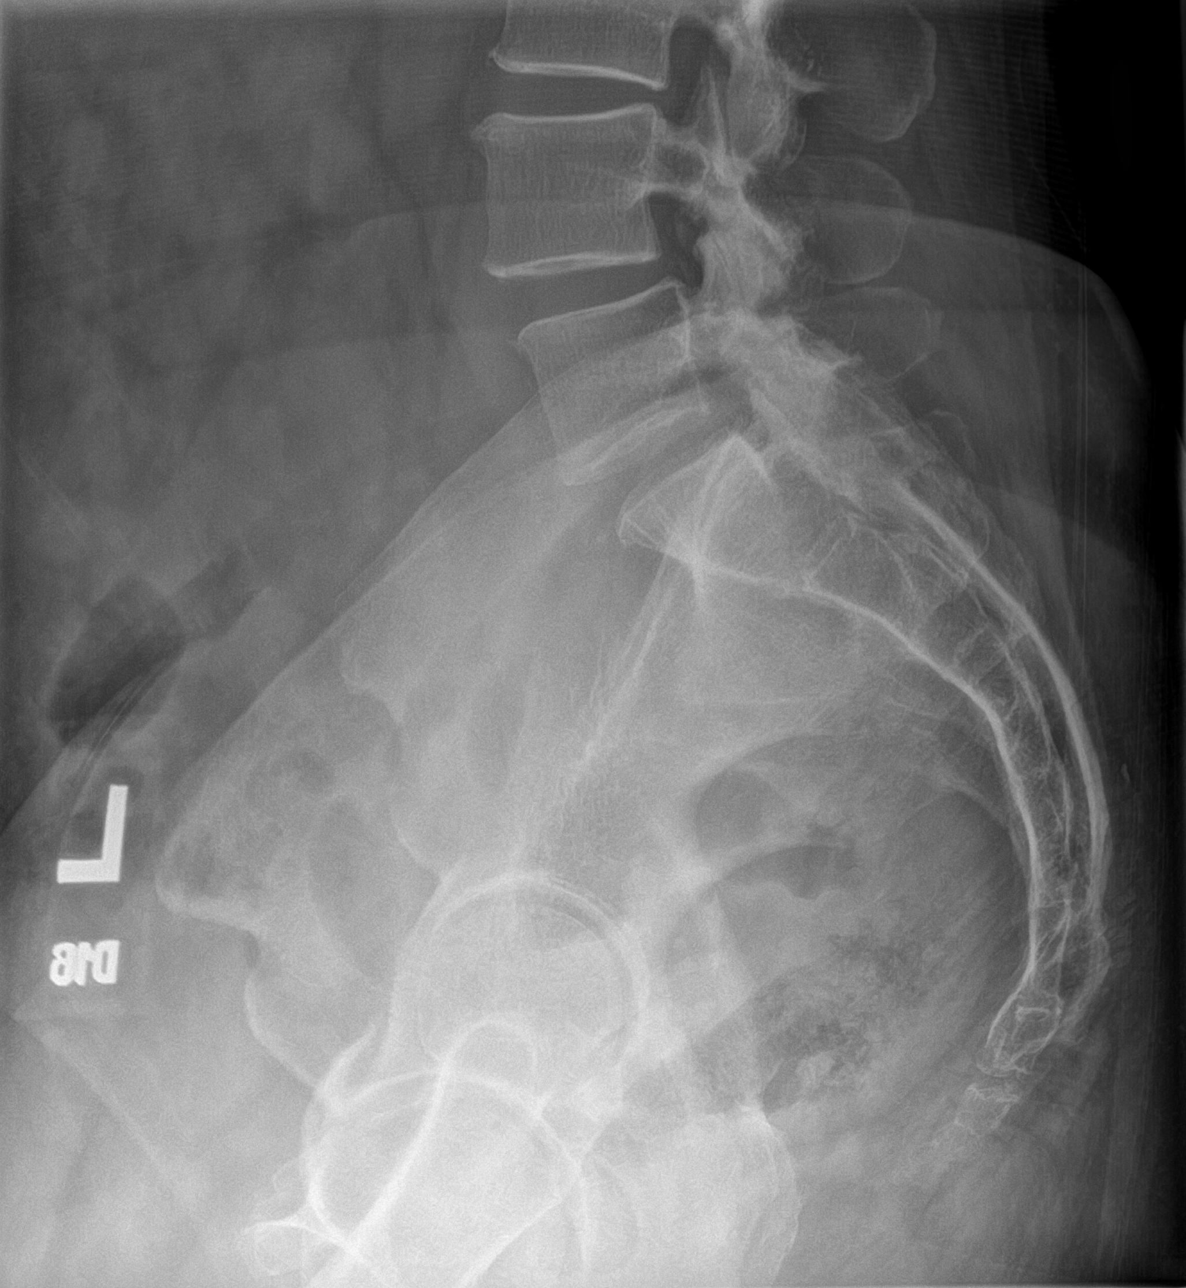

[3 of 3 positions shown; findings below may reference images not displayed]

FINDINGS: Mild degenerative anterolisthesis of L4. Disc spaces are maintained.
No acute bony findings. The visualized bony pelvis is intact. The SI
joints appear normal. This is due to moderate facet disease at L4-5.
No pars defects are identified.
IMPRESSION: 1. No acute bony findings.
2. Mild degenerative anterolisthesis of L4.

## 2020-10-21 MED ORDER — PREDNISONE 50 MG PO TABS: 50.0000 mg | ORAL_TABLET | Freq: Every day | ORAL | 0 refills | Status: DC

## 2020-10-21 MED ORDER — GABAPENTIN 300 MG PO CAPS: 300.0000 mg | ORAL_CAPSULE | Freq: Three times a day (TID) | ORAL | 3 refills | Status: DC | PRN

## 2020-10-21 NOTE — Patient Instructions (Addendum)
Thank you for coming in today.  Take the prednisone now and start the gabapentin mostly at bedtime.  Ok to take the gabapentin up to 3x daily.   Please get an Xray today before you leave  If this is not helping let me know.   If not better eventually I will arrange for MRI.    Sciatica Rehab Ask your health care provider which exercises are safe for you. Do exercises exactly as told by your health care provider and adjust them as directed. It is normal to feel mild stretching, pulling, tightness, or discomfort as you do these exercises. Stop right away if you feel sudden pain or your pain gets worse. Do not begin these exercises until told by your health care provider. Stretching and range-of-motion exercises These exercises warm up your muscles and joints and improve the movement and flexibility of your hips and back. These exercises also help to relieve pain, numbness, and tingling. Sciatic nerve glide 1. Sit in a chair with your head facing down toward your chest. Place your hands behind your back. Let your shoulders slump forward. 2. Slowly straighten one of your legs while you tilt your head back as if you are looking toward the ceiling. Only straighten your leg as far as you can without making your symptoms worse. 3. Hold this position for __________ seconds. 4. Slowly return to the starting position. 5. Repeat with your other leg. Repeat __________ times. Complete this exercise __________ times a day. Knee to chest with hip adduction and internal rotation 1. Lie on your back on a firm surface with both legs straight. 2. Bend one of your knees and move it up toward your chest until you feel a gentle stretch in your lower back and buttock. Then, move your knee toward the shoulder that is on the opposite side from your leg. This is hip adduction and internal rotation. ? Hold your leg in this position by holding on to the front of your knee. 3. Hold this position for __________  seconds. 4. Slowly return to the starting position. 5. Repeat with your other leg. Repeat __________ times. Complete this exercise __________ times a day.   Prone extension on elbows 1. Lie on your abdomen on a firm surface. A bed may be too soft for this exercise. 2. Prop yourself up on your elbows. 3. Use your arms to help lift your chest up until you feel a gentle stretch in your abdomen and your lower back. ? This will place some of your body weight on your elbows. If this is uncomfortable, try stacking pillows under your chest. ? Your hips should stay down, against the surface that you are lying on. Keep your hip and back muscles relaxed. 4. Hold this position for __________ seconds. 5. Slowly relax your upper body and return to the starting position. Repeat __________ times. Complete this exercise __________ times a day.   Strengthening exercises These exercises build strength and endurance in your back. Endurance is the ability to use your muscles for a long time, even after they get tired. Pelvic tilt This exercise strengthens the muscles that lie deep in the abdomen. 1. Lie on your back on a firm surface. Bend your knees and keep your feet flat on the floor. 2. Tense your abdominal muscles. Tip your pelvis up toward the ceiling and flatten your lower back into the floor. ? To help with this exercise, you may place a small towel under your lower back and try to  push your back into the towel. 3. Hold this position for __________ seconds. 4. Let your muscles relax completely before you repeat this exercise. Repeat __________ times. Complete this exercise __________ times a day. Alternating arm and leg raises 1. Get on your hands and knees on a firm surface. If you are on a hard floor, you may want to use padding, such as an exercise mat, to cushion your knees. 2. Line up your arms and legs. Your hands should be directly below your shoulders, and your knees should be directly below your  hips. 3. Lift your left leg behind you. At the same time, raise your right arm and straighten it in front of you. ? Do not lift your leg higher than your hip. ? Do not lift your arm higher than your shoulder. ? Keep your abdominal and back muscles tight. ? Keep your hips facing the ground. ? Do not arch your back. ? Keep your balance carefully, and do not hold your breath. 4. Hold this position for __________ seconds. 5. Slowly return to the starting position. 6. Repeat with your right leg and your left arm. Repeat __________ times. Complete this exercise __________ times a day.   Posture and body mechanics Good posture and healthy body mechanics can help to relieve stress in your body's tissues and joints. Body mechanics refers to the movements and positions of your body while you do your daily activities. Posture is part of body mechanics. Good posture means:  Your spine is in its natural S-curve position (neutral).  Your shoulders are pulled back slightly.  Your head is not tipped forward. Follow these guidelines to improve your posture and body mechanics in your everyday activities. Standing  When standing, keep your spine neutral and your feet about hip width apart. Keep a slight bend in your knees. Your ears, shoulders, and hips should line up.  When you do a task in which you stand in one place for a long time, place one foot up on a stable object that is 2-4 inches (5-10 cm) high, such as a footstool. This helps keep your spine neutral.   Sitting  When sitting, keep your spine neutral and keep your feet flat on the floor. Use a footrest, if necessary, and keep your thighs parallel to the floor. Avoid rounding your shoulders, and avoid tilting your head forward.  When working at a desk or a computer, keep your desk at a height where your hands are slightly lower than your elbows. Slide your chair under your desk so you are close enough to maintain good posture.  When working at  a computer, place your monitor at a height where you are looking straight ahead and you do not have to tilt your head forward or downward to look at the screen.   Resting  When lying down and resting, avoid positions that are most painful for you.  If you have pain with activities such as sitting, bending, stooping, or squatting, lie in a position in which your body does not bend very much. For example, avoid curling up on your side with your arms and knees near your chest (fetal position).  If you have pain with activities such as standing for a long time or reaching with your arms, lie with your spine in a neutral position and bend your knees slightly. Try the following positions: ? Lying on your side with a pillow between your knees. ? Lying on your back with a pillow under your knees.  Lifting  When lifting objects, keep your feet at least shoulder width apart and tighten your abdominal muscles.  Bend your knees and hips and keep your spine neutral. It is important to lift using the strength of your legs, not your back. Do not lock your knees straight out.  Always ask for help to lift heavy or awkward objects.   This information is not intended to replace advice given to you by your health care provider. Make sure you discuss any questions you have with your health care provider. Document Revised: 12/16/2018 Document Reviewed: 09/15/2018 Elsevier Patient Education  [employer].

## 2020-10-23 NOTE — Progress Notes (Signed)
X-ray lumbar spine shows some arthritis worse at L4-L5.

## 2020-10-25 ENCOUNTER — Telehealth: Payer: Self-pay | Admitting: Family Medicine

## 2020-10-25 DIAGNOSIS — M5416 Radiculopathy, lumbar region: Secondary | ICD-10-CM

## 2020-10-25 NOTE — Telephone Encounter (Signed)
Patient called to follow up with Dr [name] after her visit on Monday. She said that she finished the round of Prednisone that was prescribed but has not had any relief. It is very painful to bend and especially sit (making driving or riding in the car very difficult). She said that Dr [name] mentioned an MRI if no improvement, so she asked if this could be ordered or what Dr [name] would recommend for her to do?  Please advise.

## 2020-10-25 NOTE — Telephone Encounter (Signed)
MRI of the lumbar spine ordered

## 2020-10-25 NOTE — Addendum Note (Signed)
Addended by: Gregor Hams on: 10/25/2020 12:05 PM   Modules accepted: Orders

## 2020-10-28 MED ORDER — OXYCODONE-ACETAMINOPHEN 5-325 MG PO TABS
1.0000 | ORAL_TABLET | ORAL | 0 refills | Status: DC | PRN
Start: 2020-10-28 — End: 2020-11-07

## 2020-10-28 NOTE — Telephone Encounter (Signed)
Called pt and relayed fact that Dr. [name] ordered her L-spine MRI to [place].  Pt states that her pain is worsening.  She had no relief from the prednisone.  She is taking Gabapentin tid and is wanting to know if there is anything else she can be prescribed for pain.  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  Prefers [employer].  Please advise.

## 2020-10-28 NOTE — Telephone Encounter (Signed)
Called pt and relayed fact that Dr. [name] ordered her L-spine MRI to [place].  Pt states that her pain is worsening.  She had no relief from the prednisone.  She is taking Gabapentin tid and is wanting to know if there is anything else she can be prescribed for pain.  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  Prefers [employer].  Please advise.

## 2020-10-28 NOTE — Addendum Note (Signed)
Addended by: Gregor Hams on: 10/28/2020 04:31 PM   Modules accepted: Orders

## 2020-10-28 NOTE — Telephone Encounter (Signed)
Oxycodone sent to [facility].  This was prescribed in April 2021.

## 2020-10-29 NOTE — Telephone Encounter (Signed)
[  Yesterday 4:43 PM] Judith Drake, Judith Drake is on the phone.. wanting to confirm, she should stop the gabapentin and take the oxycodone right?  Please advise regarding gabapentin and Oxycodone questions.

## 2020-10-29 NOTE — Telephone Encounter (Signed)
Called and LM on pt's VM w/ Dr. Clovis Riley response.  Advise her to try the Oxycodone by itself during the day for the first few days to see what the response is to that medication.  If she wants to try both medications together, advise that she do that in the evening or at night so if they do have an extra sedating effect, then at least she sleeping.  Advise pt not to drive if taking both the Gabapentin and Oxycodone together.  Also advise pt to return call if she has questions.

## 2020-10-29 NOTE — Telephone Encounter (Signed)
You can take both but they are both sedating. I do not recommend driving after taking both medications.

## 2020-11-02 ENCOUNTER — Other Ambulatory Visit: Payer: Self-pay

## 2020-11-02 ENCOUNTER — Ambulatory Visit (INDEPENDENT_AMBULATORY_CARE_PROVIDER_SITE_OTHER): Payer: BC Managed Care – PPO

## 2020-11-02 DIAGNOSIS — M545 Low back pain, unspecified: Secondary | ICD-10-CM | POA: Diagnosis not present

## 2020-11-02 DIAGNOSIS — M48061 Spinal stenosis, lumbar region without neurogenic claudication: Secondary | ICD-10-CM

## 2020-11-02 DIAGNOSIS — M5126 Other intervertebral disc displacement, lumbar region: Secondary | ICD-10-CM

## 2020-11-02 DIAGNOSIS — M5416 Radiculopathy, lumbar region: Secondary | ICD-10-CM

## 2020-11-02 IMAGING — MR MR LUMBAR SPINE W/O CM
4 of 5 series · 25 of 48 positions shown · non-contrast
Comparison: Radiography [DATE].

CLINICAL DATA: Low back pain and left leg pain which is worsening
over the last several weeks.

EXAM:
MRI LUMBAR SPINE WITHOUT CONTRAST
TECHNIQUE: Multiplanar, multisequence MR imaging of the lumbar spine was
performed. No intravenous contrast was administered.

[Series 4: T2 · sagittal · 4.0mm · 0.81mm/px · 6 of 15 slices shown (1 of 2)]
[im 1/15]
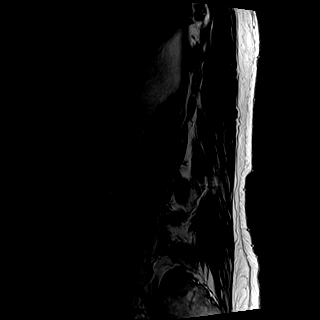
[im 3/15]
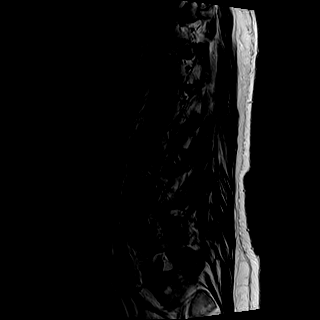
[im 6/15]
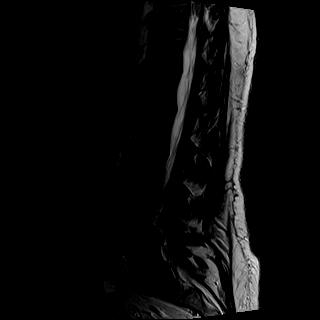
[im 9/15]
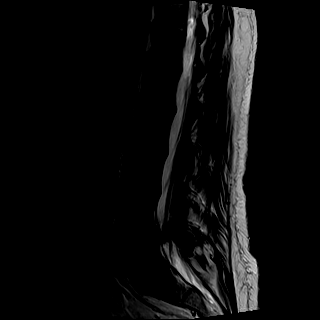
[im 12/15]
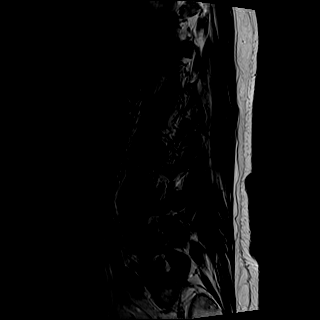
[im 15/15]
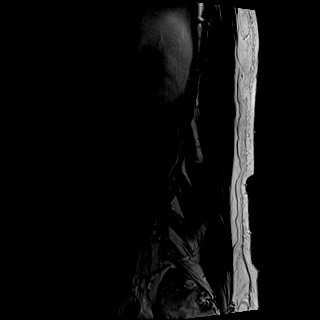

[Series 5: T1 · sagittal · 4.0mm · 0.41mm/px · 6 of 15 slices shown (1 of 2)]
[im 1/15]
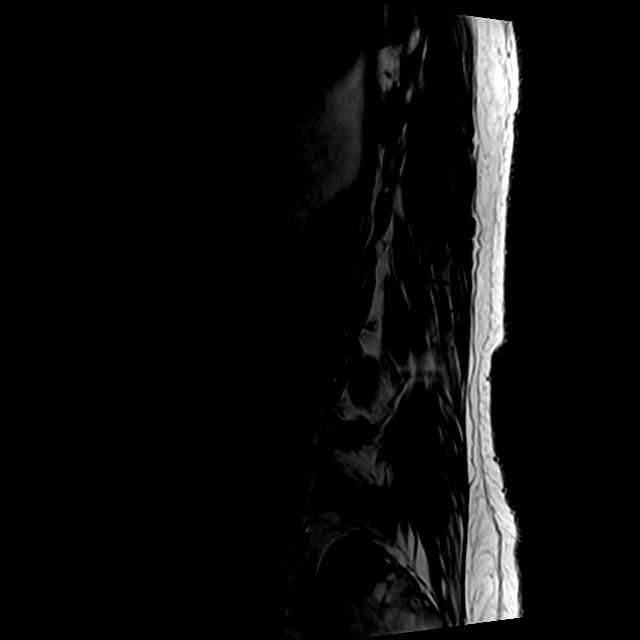
[im 3/15]
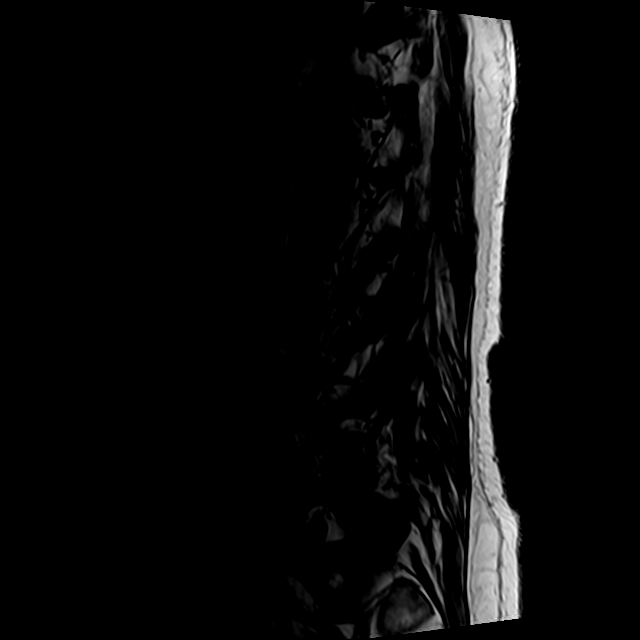
[im 6/15]
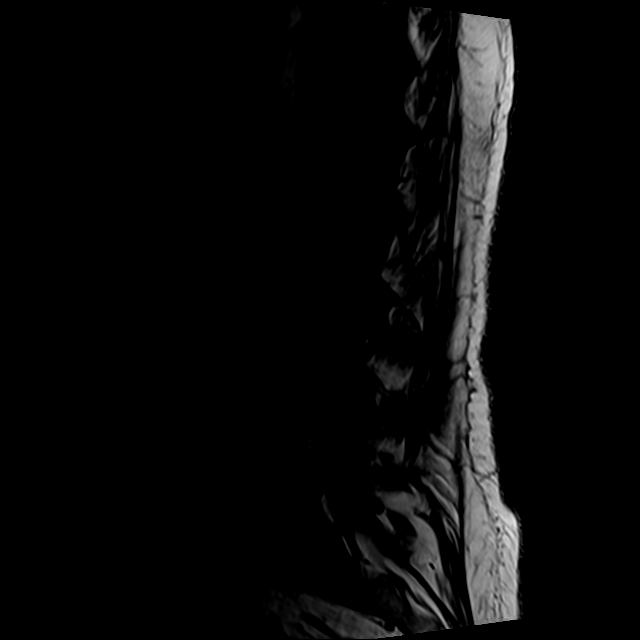
[im 9/15]
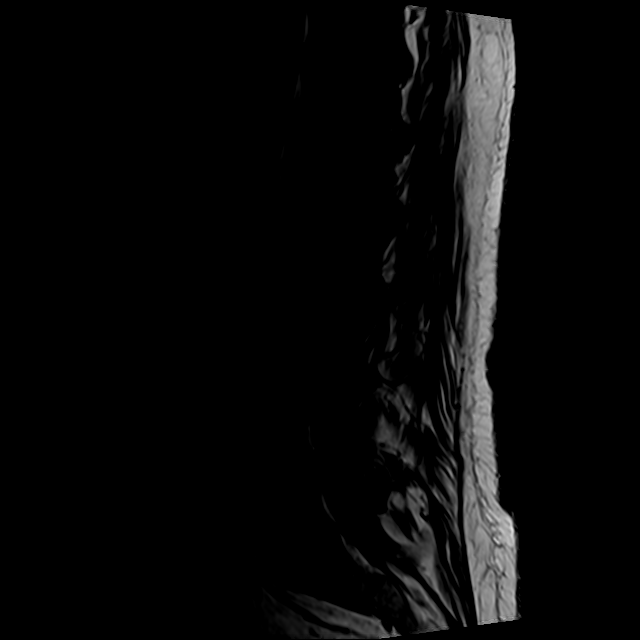
[im 12/15]
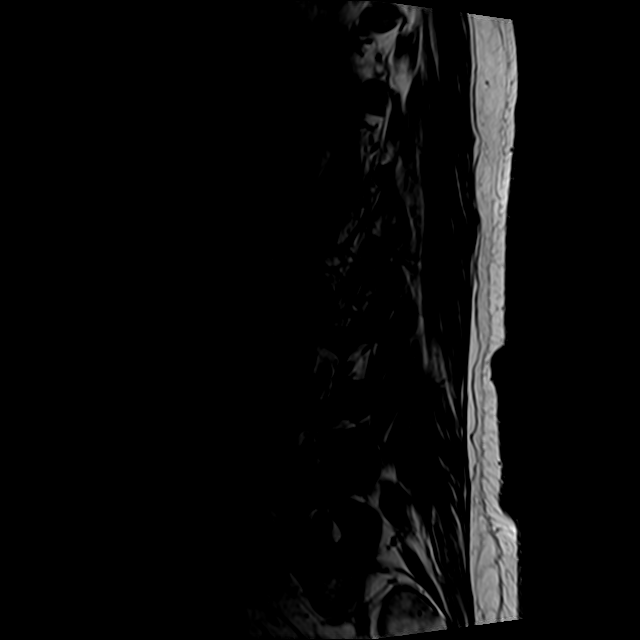
[im 15/15]
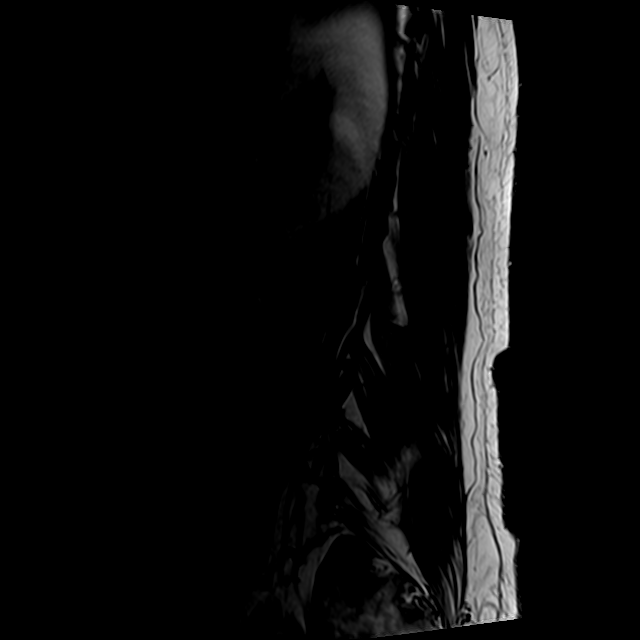

[Series 9: T2 · axial · 4.0mm · 0.78mm/px · z∈[-90,+133]mm · 9 of 39 slices shown (2 of 2)]
[im 1/39]
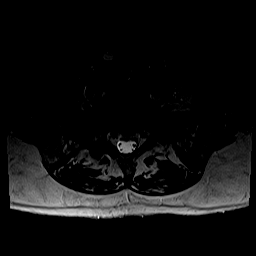
[im 6/39]
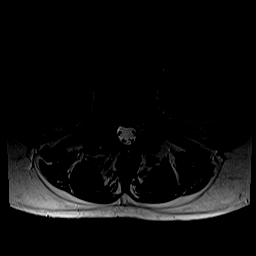
[im 11/39]
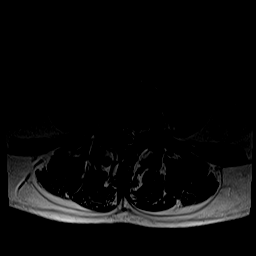
[im 17/39]
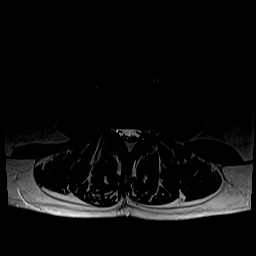
[im 20/39]
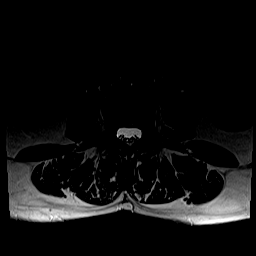
[im 22/39]
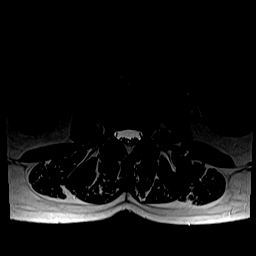
[im 28/39]
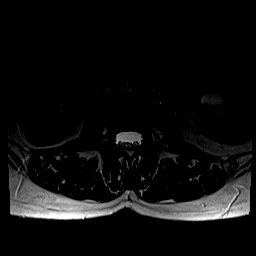
[im 33/39]
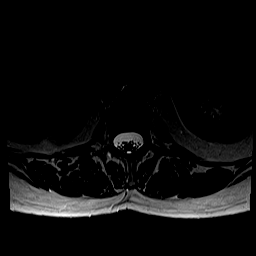
[im 39/39]
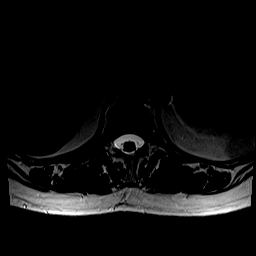

[Series 12: T1 · axial · 4.0mm · 0.39mm/px · z∈[-90,+103]mm · 4 of 39 slices shown (2 of 2)]
[im 1/39]
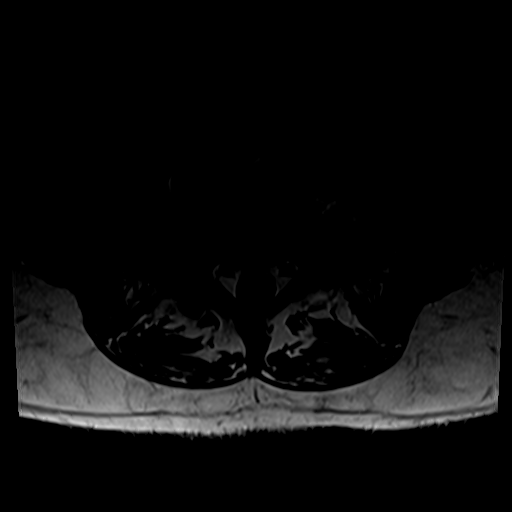
[im 6/39]
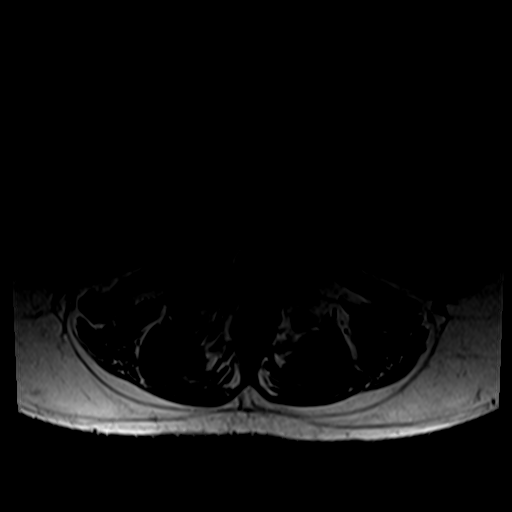
[im 20/39]
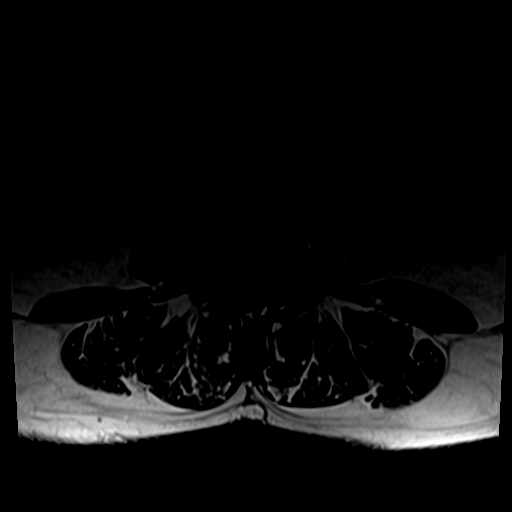
[im 33/39]
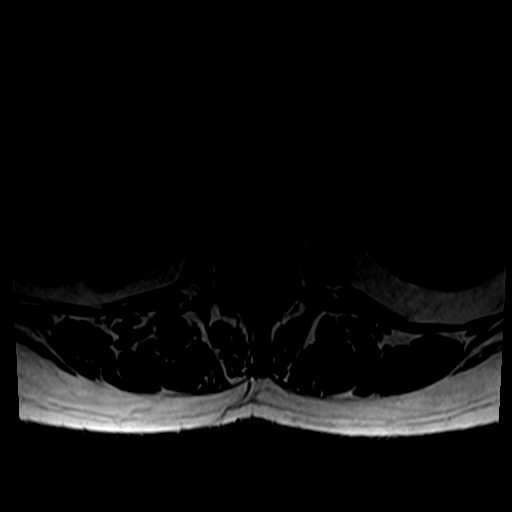

[25 of 48 positions shown; findings below may reference images not displayed]

FINDINGS: Segmentation:  5 lumbar type vertebral bodies.

Alignment: 3 mm degenerative anterolisthesis L4-5. 2 mm degenerative
anterolisthesis L5-S1.

Vertebrae:  No fracture or primary bone lesion.

Conus medullaris and cauda equina: Conus extends to the L1 level.
Conus and cauda equina appear normal.

Paraspinal and other soft tissues: Negative

Disc levels:

Minimal non-compressive disc bulges at L2-3 and above.

L3-4: Mild bulging of the disc. Mild facet and ligamentous
prominence. No compressive stenosis.

L4-5: Bilateral facet osteoarthritis with 3 mm of anterolisthesis.
Question potential for bilateral synovial cysts. Desiccation and
bulging of the disc. Mild stenosis of the subarticular lateral
recess on the right at the upper foraminal level. Severe left
lateral recess stenosis on the left the lower facet joint level.
Left L5 nerve compression seems likely. Edematous change of the
facet joints could be associated with back pain or referred facet
syndrome pain.

L5-S1: Bilateral facet osteoarthritis with 2 mm of anterolisthesis.
No disc pathology. No compressive narrowing of the canal or
foramina. Some edematous change of facet joints could be associated
with low back pain or referred facet syndrome.
IMPRESSION: L4-5: Advanced bilateral facet arthropathy allowing 3 mm of
anterolisthesis. Question bilateral synovial cysts. Stenosis of the
upper subarticular lateral recess on the right with some potential
to affect the right L5 nerve. More severe stenosis of the lower
subarticular lateral recess on the left with high likelihood of left
L5 nerve compression. Edematous change of facet joints which could
be correlated with back pain or referred facet syndrome pain.

L5-S1: Bilateral facet arthropathy with 2 mm of anterolisthesis. No
disc pathology. No compressive stenosis. Edematous change of the
facets could be correlated with back pain or referred facet syndrome
pain.

## 2020-11-04 ENCOUNTER — Ambulatory Visit: Payer: BC Managed Care – PPO | Admitting: Family Medicine

## 2020-11-04 ENCOUNTER — Other Ambulatory Visit: Payer: Self-pay

## 2020-11-04 VITALS — BP 127/85 | HR 93 | Ht 64.0 in | Wt 164.2 lb

## 2020-11-04 DIAGNOSIS — M5416 Radiculopathy, lumbar region: Secondary | ICD-10-CM | POA: Diagnosis not present

## 2020-11-04 NOTE — Patient Instructions (Addendum)
Thank you for coming in today.  Please call [facility] at [phone] to schedule your spine injection.   Let me know if you are not feeling better.   STOP all Asprin for 1 week prior to the injection.    Epidural Steroid Injection  An epidural steroid injection is a shot of steroid medicine and numbing medicine that is given into the space between the spinal cord and the bones of the back (epidural space). The shot helps relieve pain caused by an irritated or swollen nerve root. The amount of pain relief you get from the injection depends on what is causing the nerve to be swollen and irritated, and how long your pain lasts. You are more likely to benefit from this injection if your pain is strong and comes on suddenly rather than if you have had long-term (chronic) pain. Tell a health care provider about:  Any allergies you have.  All medicines you are taking, including vitamins, herbs, eye drops, creams, and over-the-counter medicines.  Any problems you or family members have had with anesthetic medicines.  Any blood disorders you have.  Any surgeries you have had.  Any medical conditions you have.  Whether you are pregnant or may be pregnant. What are the risks? Generally, this is a safe procedure. However, problems may occur, including:  Headache.  Bleeding.  Infection.  Allergic reaction to medicines.  Nerve damage. What happens before the procedure? Staying hydrated Follow instructions from your health care provider about hydration, which may include:  Up to 2 hours before the procedure - you may continue to drink clear liquids, such as water, clear fruit juice, black coffee, and plain tea. Eating and drinking restrictions Follow instructions from your health care provider about eating and drinking, which may include:  8 hours before the procedure - stop eating heavy meals or foods, such as meat, fried foods, or fatty foods.  6 hours before the  procedure - stop eating light meals or foods, such as toast or cereal.  6 hours before the procedure - stop drinking milk or drinks that contain milk.  2 hours before the procedure - stop drinking clear liquids. Medicines  You may be given medicines to lower anxiety.  Ask your health care provider about: ? Changing or stopping your regular medicines. This is especially important if you are taking diabetes medicines or blood thinners. ? Taking medicines such as aspirin and ibuprofen. These medicines can thin your blood. Do not take these medicines unless your health care provider tells you to take them. ? Taking over-the-counter medicines, vitamins, herbs, and supplements. General instructions  Ask your health care provider what steps will be taken to prevent infection.  Plan to have a responsible adult take you home from the hospital or clinic.  If you will be going home right after the procedure, plan to have a responsible adult care for you for the time you are told. This is important. What happens during the procedure?  An IV will be inserted into one of your veins.  You will be given one or more of the following: ? A medicine to help you relax (sedative). ? A medicine to numb the area (local anesthetic).  You will be asked to lie on your abdomen or sit.  The injection site will be cleaned.  A needle will be inserted through your skin into the epidural space. This may cause you some discomfort. An X-ray machine will be used to guide the needle as close as  possible to the affected nerve.  A steroid medicine and a local anesthetic will be injected into the epidural space.  The needle and IV will be removed.  A bandage (dressing) will be put over the injection site. The procedure may vary among health care providers and hospitals. What can I expect after the procedure?  Your blood pressure, heart rate, breathing rate, and blood oxygen level will be monitored until you leave  the hospital or clinic.  Your arm or leg may feel weak or numb for a few hours.  The injection site may feel sore. Follow these instructions at home: Injection site care  You may remove the bandage (dressing) after 24 hours.  Check your injection site every day for signs of infection. Check for: ? Redness, swelling, or pain. ? Fluid or blood. ? Warmth. ? Pus or a bad smell. Managing pain, stiffness, and swelling  For 24 hours after the procedure: ? Avoid using heat on the injection site. ? Do not take baths, swim, or use a hot tub until your health care provider approves. Ask your health care provider if you may take showers. You may only be allowed to take sponge baths.   If directed, put ice on the injection site. To do this: ? Put ice in a plastic bag. ? Place a towel between your skin and the bag. ? Leave the ice on for 20 minutes, 2-3 times a day.   Activity  If you were given a sedative during the procedure, it can affect you for several hours. Do not drive or operate machinery until your health care provider says that it is safe.  Return to your normal activities as told by your health care provider. Ask your health care provider what activities are safe for you. General instructions  Take over-the-counter and prescription medicines only as told by your health care provider.  Drink enough fluid to keep your urine pale yellow.  Keep all follow-up visits as told by your health care provider. This is important. Contact a health care provider if:  You have any of these signs of infection: ? Redness, swelling, or pain around your injection site. ? Fluid or blood coming from your injection site. ? Warmth coming from your injection site. ? Pus or a bad smell coming from your injection site. ? A fever.  You continue to have pain and soreness around the injection site, even after taking over-the-counter pain medicine.  You have severe, sudden, or lasting nausea or  vomiting. Get help right away if:  You have severe pain at the injection site that is not relieved by medicines.  You develop a severe headache or a stiff neck.  You become sensitive to light.  You have any new numbness or weakness in your legs or arms.  You lose control of your bladder or bowel movements.  You have trouble breathing. Summary  An epidural steroid injection is a shot of steroid medicine and numbing medicine that is given into the epidural space.  The shot helps relieve pain caused by an irritated or swollen nerve root.  You are more likely to benefit from this injection if your pain is strong and comes on suddenly rather than if you have had chronic pain. This information is not intended to replace advice given to you by your health care provider. Make sure you discuss any questions you have with your health care provider. Document Revised: 12/22/2019 Document Reviewed: 03/06/2019 Elsevier Patient Education  [place].

## 2020-11-04 NOTE — Progress Notes (Signed)
Advanced facet arthritis at L4-5 with some anterior listhesis causes nerve compression at L L5 nerve roots right worse than left actually.  But the left nerve root is being compressed and causing her pain.  We will discuss this further at your visit scheduled today.

## 2020-11-04 NOTE — Progress Notes (Signed)
I, [name], LAT, ATC acting as a scribe for Judith Leader, MD.  Judith Drake is a 50 y.o. female who presents to [facility] today for lumbar radiculopathy at L5. Pt locates pain to her L buttocks w/ radiaitng pain down the lateral L leg to the top of her L foot. Pt was last seen by Dr. [name] on 10/21/20 and was advised to plan for course of prednisone and gabapentin. Today, pt reports back pain has gotten worse. Pt reports pain is located in L buttock that radiates down the posterior aspect of leg to foot.  Radiating pain: yes down her L lateral leg to her foot LE numbness/tingling: yes in her L foot LE weakness: No- but can't put pressure on it because of the pain Aggravates: sitting, lumbar flexion, stepping the wrong way or turning the wrong way, worse in the mornings Rx tried: [facility] powder  Dx imaging: 11/02/20 L-spine MRI  02/08/18 L-spine XR & Pelvis XR  Pertinent review of systems: No fevers or chills  Relevant historical information: Hypertension.  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   Exam:  BP 127/85 (BP Location: Right Arm, Patient Position: Sitting, Cuff Size: Normal)   Pulse 93   Ht 5\' 4"  (1.626 m)   Wt 164 lb 3.2 oz (74.5 kg)   LMP  (LMP Unknown)   SpO2 98%   BMI 28.18 kg/m  General: Well Developed, well nourished, and in no acute distress.   MSK: L-spine normal-appearing Decreased lumbar motion. Intact lower extremity strength. Antalgic gait.    Lab and Radiology Results No results found for this or any previous visit (from the past 72 hour(s)). MR Lumbar Spine Wo Contrast  Result Date: 11/03/2020 CLINICAL DATA:  Low back pain and left leg pain which is worsening over the last several weeks. EXAM: MRI LUMBAR SPINE WITHOUT CONTRAST TECHNIQUE: Multiplanar, multisequence MR imaging of the lumbar spine was performed. No intravenous contrast was administered. COMPARISON:  Radiography 10/21/2020.  FINDINGS: Segmentation:  5 lumbar type vertebral bodies. Alignment: 3 mm degenerative anterolisthesis L4-5. 2 mm degenerative anterolisthesis L5-S1. Vertebrae:  No fracture or primary bone lesion. Conus medullaris and cauda equina: Conus extends to the L1 level. Conus and cauda equina appear normal. Paraspinal and other soft tissues: Negative Disc levels: Minimal non-compressive disc bulges at L2-3 and above. L3-4: Mild bulging of the disc. Mild facet and ligamentous prominence. No compressive stenosis. L4-5: Bilateral facet osteoarthritis with 3 mm of anterolisthesis. Question potential for bilateral synovial cysts. Desiccation and bulging of the disc. Mild stenosis of the subarticular lateral recess on the right at the upper foraminal level. Severe left lateral recess stenosis on the left the lower facet joint level. Left L5 nerve compression seems likely. Edematous change of the facet joints could be associated with back pain or referred facet syndrome pain. L5-S1: Bilateral facet osteoarthritis with 2 mm of anterolisthesis. No disc pathology. No compressive narrowing of the canal or foramina. Some edematous change of facet joints could be associated with low back pain or referred facet syndrome. IMPRESSION: L4-5: Advanced bilateral facet arthropathy allowing 3 mm of anterolisthesis. Question bilateral synovial cysts. Stenosis of the upper subarticular lateral recess on the right with some potential to affect the right L5 nerve. More severe stenosis of the lower subarticular lateral recess on the left with high likelihood of left L5 nerve compression. Edematous change of facet joints which could be correlated with back pain or referred  facet syndrome pain. L5-S1: Bilateral facet arthropathy with 2 mm of anterolisthesis. No disc pathology. No compressive stenosis. Edematous change of the facets could be correlated with back pain or referred facet syndrome pain. Electronically Signed   By: Nelson Chimes M.D.   On:  11/03/2020 21:09   I, Judith Drake, personally (independently) visualized and performed the interpretation of the images attached in this note.      Assessment and Plan: 51 y.o. female with left-sided L5 radiculopathy..  Patient has significant L5 neuroforaminal stenosis bilaterally left worse than right.  Plan to proceed to epidural steroid injection.  If not improved would also recommend trial of left facet injection at L4-5 and L5-S1 as these levels are also significantly contributing to her nerve compression.  Recheck in the near future following injection if needed.  Discussed treatment plan and options.  Patient expresses understanding agreement.  Will refill oxycodone if needed prior to injection.   PDMP not reviewed this encounter. Orders Placed This Encounter  Procedures  . DG INJECT DIAG/THERA/INC NEEDLE/CATH/PLC EPI/LUMB/SAC W/IMG    Standing Status:   Future    Standing Expiration Date:   11/04/2021    Order Specific Question:   Reason for Exam (SYMPTOM  OR DIAGNOSIS REQUIRED)    Answer:   lt L5 radiculoathy level and technique per radilogy    Order Specific Question:   Is the patient pregnant?    Answer:   No    Order Specific Question:   Preferred Imaging Location?    Answer:   [id number]    Order Specific Question:   Radiology Contrast Protocol - do NOT remove file path    Answer:   [email or web address]   No orders of the defined types were placed in this encounter.    Discussed warning signs or symptoms. Please see discharge instructions. Patient expresses understanding.   The above documentation has been reviewed and is accurate and complete Judith Drake, M.D. Total encounter time 20 minutes including face-to-face time with the patient and, reviewing past medical record, and charting on the date of service.   Reviewed MRI findings and treatment plan and options.

## 2020-11-06 ENCOUNTER — Other Ambulatory Visit: Payer: BC Managed Care – PPO

## 2020-11-07 ENCOUNTER — Telehealth: Payer: Self-pay | Admitting: Family Medicine

## 2020-11-07 MED ORDER — OXYCODONE-ACETAMINOPHEN 5-325 MG PO TABS: 1.0000 | ORAL_TABLET | ORAL | 0 refills | Status: DC | PRN

## 2020-11-07 NOTE — Telephone Encounter (Signed)
Patient called requesting a refill of her oxyCODONE-acetaminophen (PERCOCET/ROXICET) 5-325 MG tablet to be sent to [facility].

## 2020-11-07 NOTE — Telephone Encounter (Signed)
Refilled

## 2020-11-07 NOTE — Telephone Encounter (Signed)
Sent pt a message via MyChart to convey this message.

## 2020-11-08 ENCOUNTER — Other Ambulatory Visit: Payer: Self-pay

## 2020-11-08 ENCOUNTER — Other Ambulatory Visit: Payer: Self-pay | Admitting: Family Medicine

## 2020-11-08 ENCOUNTER — Ambulatory Visit
Admission: RE | Admit: 2020-11-08 | Discharge: 2020-11-08 | Disposition: A | Discharge status: Home / Self Care | Payer: BC Managed Care – PPO | Source: Ambulatory Visit | Attending: Family Medicine | Admitting: Family Medicine

## 2020-11-08 DIAGNOSIS — M47817 Spondylosis without myelopathy or radiculopathy, lumbosacral region: Secondary | ICD-10-CM | POA: Diagnosis not present

## 2020-11-08 DIAGNOSIS — M5416 Radiculopathy, lumbar region: Secondary | ICD-10-CM

## 2020-11-08 IMAGING — XA DG EPIDURAL NERVE ROOT
3 series · 3 of 3 positions shown · non-contrast
Comparison: none

CLINICAL DATA: 49-year-old female with clinical symptoms of L5
radiculopathy in the setting of lumbosacral spondylosis without
myelopathy. MRI imaging dated [DATE] demonstrates combined
degenerative disc disease and bilateral facet arthropathy at L4-L5
with a high likelihood of left L5 nerve root compression.

[Series 1: ortho standard · 1 of 1 slices shown (1 of 3)]
[im 1/1]
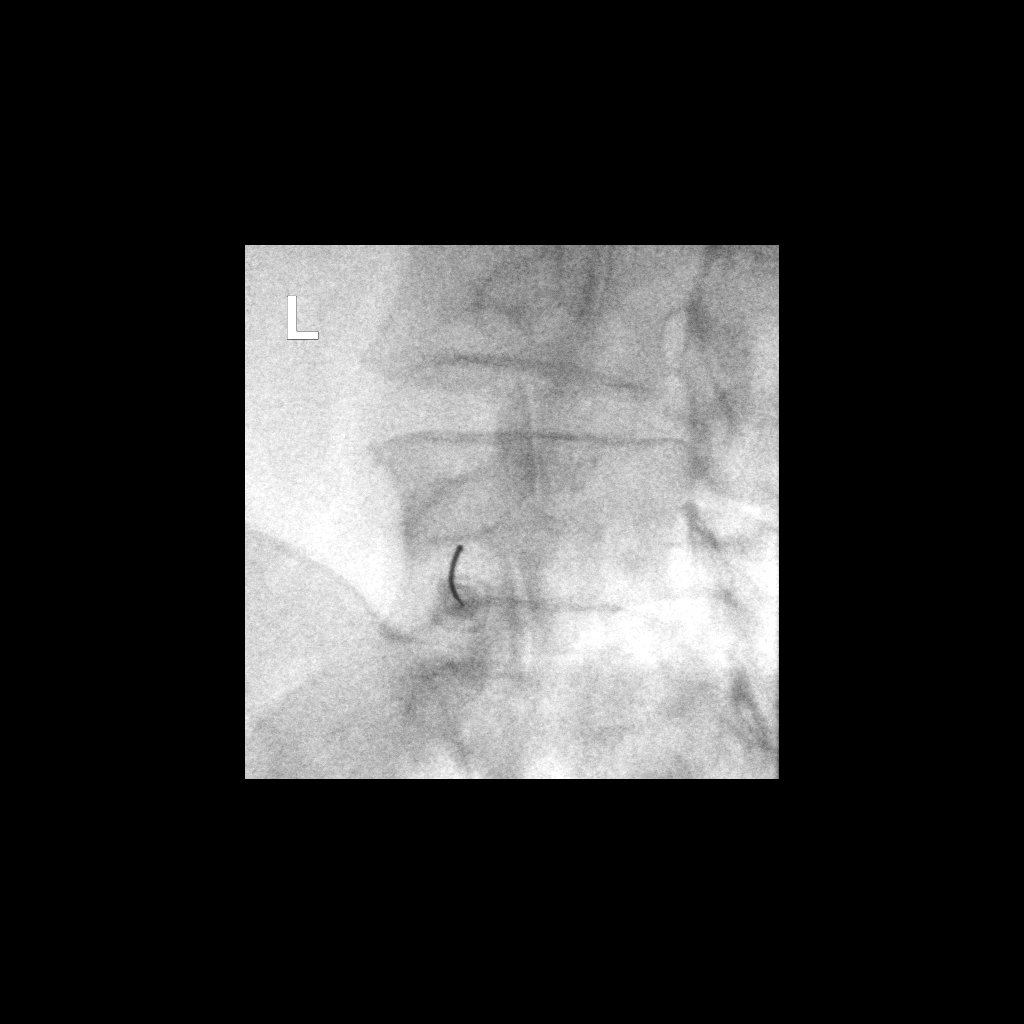

[Series 2: ortho standard · 1 of 1 slices shown (2 of 3)]
[im 1/1]
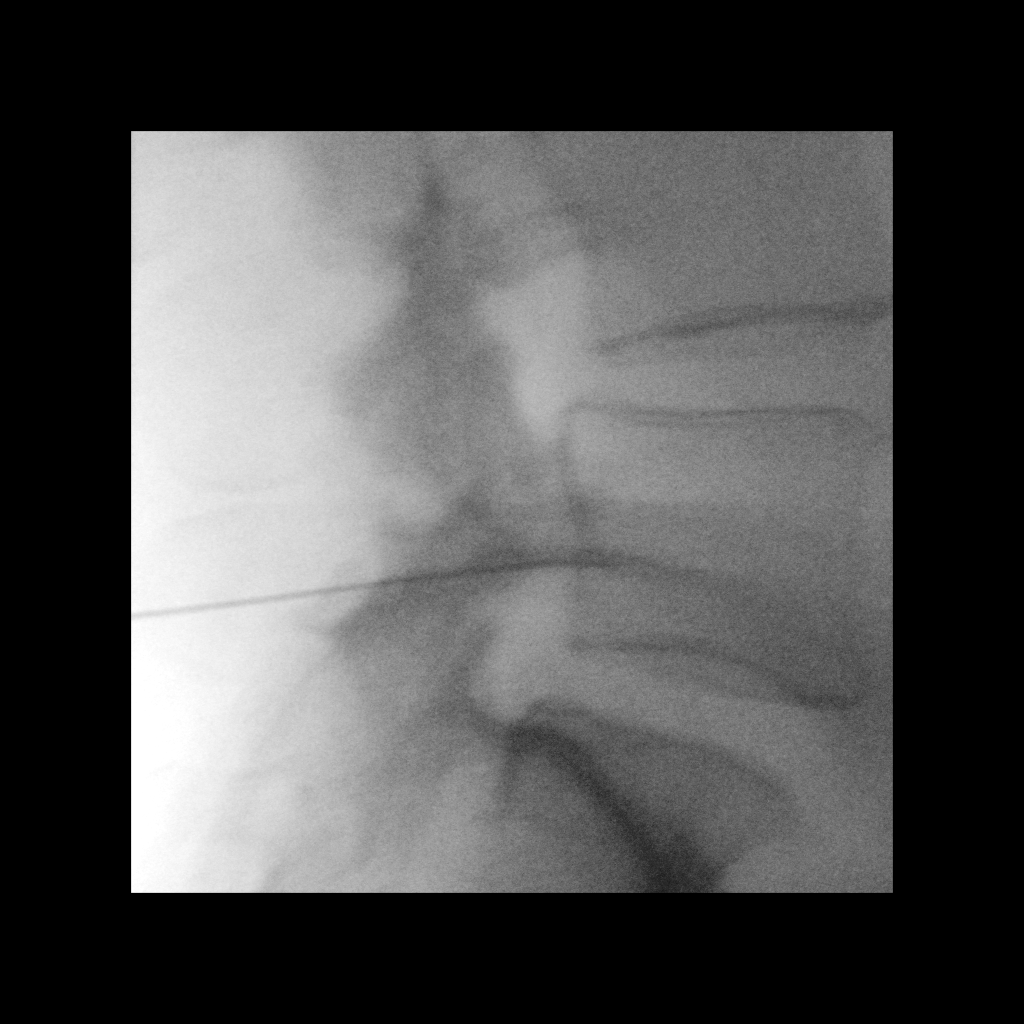

[Series 3: ortho standard · 1 of 1 slices shown (3 of 3)]
[im 1/1]
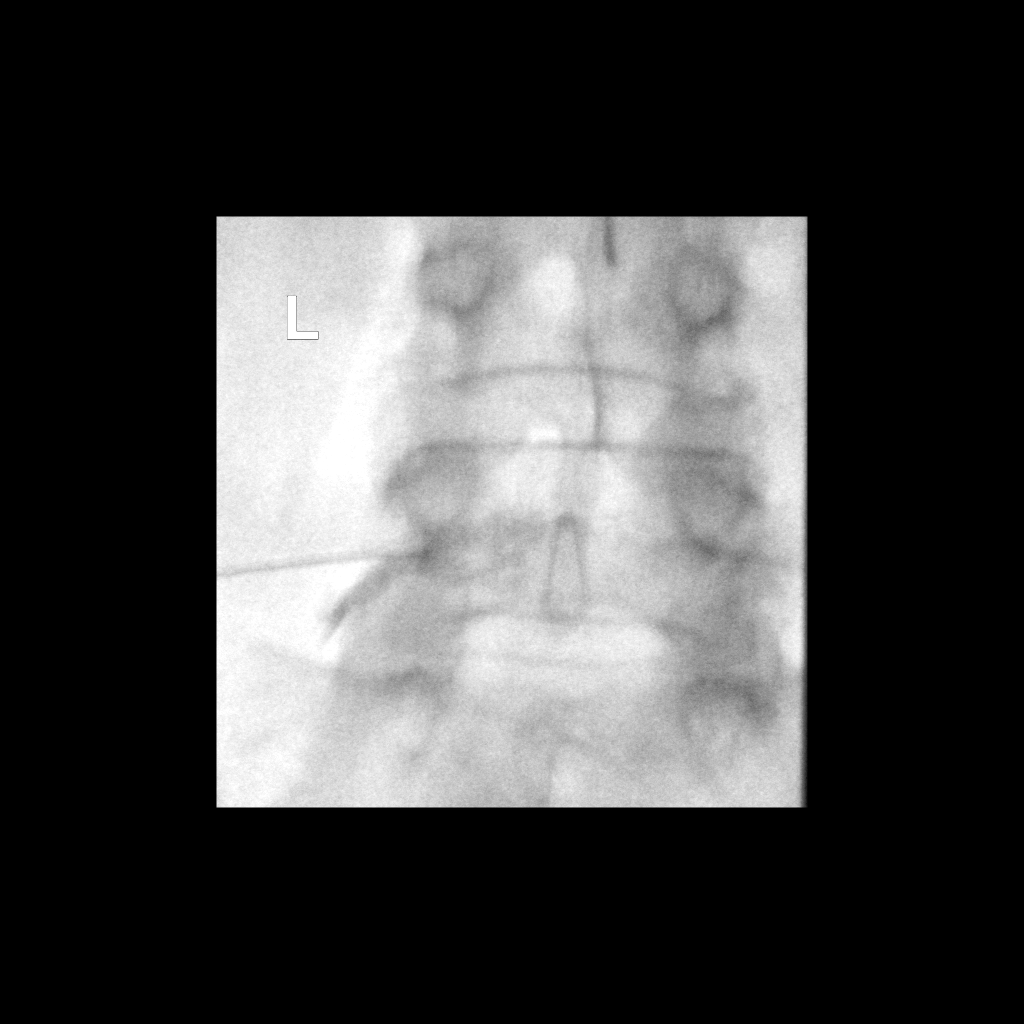

[3 of 3 positions shown; findings below may reference images not displayed]

EXAM:
EPIDURAL/NERVE ROOT

FLUOROSCOPY TIME:  0 minutes 23 seconds 3.4 mGy

PROCEDURE:
The procedure, risks, benefits, and alternatives were explained to
the patient. Questions regarding the procedure were encouraged and
answered. The patient understands and consents to the procedure.

LEFT L5 NERVE ROOT BLOCK AND TRANSFORAMINAL EPIDURAL: A posterior
oblique approach was taken to the intervertebral foramen on the left
at L5 using a curved 22 gauge spinal needle. Injection of Omnipaque
180 outlined the left L5 nerve root and showed good epidural spread.
No vascular opacification is seen. 120 mg of Depo-Medrol mixed with
1 mL 1% lidocaine were instilled. The procedure was well-tolerated,
and the patient was discharged thirty minutes following the
injection in good condition.

COMPLICATIONS:
None
IMPRESSION: Technically successful injection consisting of a left L5 nerve root
block and transforaminal epidural.

## 2020-11-08 MED ORDER — IOPAMIDOL (ISOVUE-M 200) INJECTION 41%
1.0000 mL | Freq: Once | INTRAMUSCULAR | Status: AC
  Administered 2020-11-08: 1 mL via EPIDURAL

## 2020-11-08 MED ORDER — METHYLPREDNISOLONE ACETATE 40 MG/ML INJ SUSP (RADIOLOG
120.0000 mg | Freq: Once | INTRAMUSCULAR | Status: AC
  Administered 2020-11-08: 120 mg via EPIDURAL

## 2020-11-08 NOTE — Discharge Instructions (Signed)

## 2020-11-15 ENCOUNTER — Telehealth: Payer: Self-pay | Admitting: Family Medicine

## 2020-11-15 MED ORDER — OXYCODONE-ACETAMINOPHEN 5-325 MG PO TABS: 1.0000 | ORAL_TABLET | ORAL | 0 refills | Status: DC | PRN

## 2020-11-15 NOTE — Telephone Encounter (Signed)
Oxycodone refilled.  If you are having new neurologic symptoms we should probably see each other again.  Recommend schedule follow-up appointment with me next week.  I could arrange for other injections or to have you see a neurosurgeon.

## 2020-11-15 NOTE — Telephone Encounter (Signed)
Spoke w/ pt and informed her Dr. [name] refilled her rx and would like to see her for a f/u in clinic. Pt is scheduled for a visit on 3/17.

## 2020-11-15 NOTE — Telephone Encounter (Signed)
Patient called stating that she received her epidural but has noticed very minimal to no improvement. She is also having inner thigh pain that comes and goes in addition to the continuing outer thigh pain.  She added that she has started to having some bladder control issues and was concerned. She did not have this problem when she originally came to see Dr [name].  Dr [name] recently refilled her pain medication but will be out on Monday. Could this be sent in so that it will be ready when she needs it?   Please advise.

## 2020-11-20 NOTE — Progress Notes (Unsigned)
I, Wendy Poet, LAT, ATC, am serving as scribe for Dr. [name].  Judith Drake is a 50 y.o. female who presents to [place] at [facility] today for lumbar radiculopathy at L5 and significant L5 neuroforaminal stenosis bilaterally, L>R. Pt was last seen by Dr. [name] on 11/04/20 and was advised to proceed w/ epidural steroid injection, which was completed on 11/08/20. Today, pt reports very little to minimal change in her leg pain since her epidural.  She states that she does feel like the pain medication helps when she's up and walking but she con't to have the most difficulty w/ sitting.  She is only having pain in her L leg but notes new pain in her L inner thigh that runs to her knee.  She also notes new urinary urgency and frequency.  She con't to take both Gabapentin and oxycodone.   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  Dx imaging: 11/02/20 L-spine MRI             02/08/18 L-spine XR & Pelvis XR  Pertinent review of systems: No fevers or chills  Relevant historical information: Hypertension   Exam:  BP 132/84 (BP Location: Right Arm, Patient Position: Sitting, Cuff Size: Normal)   Pulse 96   Ht 5\' 4"  (1.626 m)   Wt 156 lb 6.4 oz (70.9 kg)   LMP  (LMP Unknown)   SpO2 96%   BMI 26.85 kg/m  General: Well Developed, well nourished, and in no acute distress.   MSK: Lumbar spine: Normal-appearing decreased lumbar motion.  Extra strength is intact.  Normal sensation.  No saddle anesthesia.    Lab and radiology  EXAM: MRI LUMBAR SPINE WITHOUT CONTRAST  TECHNIQUE: Multiplanar, multisequence MR imaging of the lumbar spine was performed. No intravenous contrast was administered.  COMPARISON:  Radiography 10/21/2020.  FINDINGS: Segmentation:  5 lumbar type vertebral bodies.  Alignment: 3 mm degenerative anterolisthesis L4-5. 2 mm degenerative anterolisthesis L5-S1.  Vertebrae:  No fracture or primary bone  lesion.  Conus medullaris and cauda equina: Conus extends to the L1 level. Conus and cauda equina appear normal.  Paraspinal and other soft tissues: Negative  Disc levels:  Minimal non-compressive disc bulges at L2-3 and above.  L3-4: Mild bulging of the disc. Mild facet and ligamentous prominence. No compressive stenosis.  L4-5: Bilateral facet osteoarthritis with 3 mm of anterolisthesis. Question potential for bilateral synovial cysts. Desiccation and bulging of the disc. Mild stenosis of the subarticular lateral recess on the right at the upper foraminal level. Severe left lateral recess stenosis on the left the lower facet joint level. Left L5 nerve compression seems likely. Edematous change of the facet joints could be associated with back pain or referred facet syndrome pain.  L5-S1: Bilateral facet osteoarthritis with 2 mm of anterolisthesis. No disc pathology. No compressive narrowing of the canal or foramina. Some edematous change of facet joints could be associated with low back pain or referred facet syndrome.  IMPRESSION: L4-5: Advanced bilateral facet arthropathy allowing 3 mm of anterolisthesis. Question bilateral synovial cysts. Stenosis of the upper subarticular lateral recess on the right with some potential to affect the right L5 nerve. More severe stenosis of the lower subarticular lateral recess on the left with high likelihood of left L5 nerve compression. Edematous change of facet joints which could be correlated with back pain or referred facet syndrome pain.  L5-S1: Bilateral facet arthropathy with 2 mm of anterolisthesis. No disc  pathology. No compressive stenosis. Edematous change of the facets could be correlated with back pain or referred facet syndrome pain.   Electronically Signed   By: Nelson Chimes M.D.   On: 11/03/2020 21:09  I, [name], personally (independently) visualized and performed the interpretation of the images  attached in this note.    Assessment and Plan: 50 y.o. female with low back pain with left-sided lumbar radiculopathy at L5 dermatomal pattern.  Patient has new pain in the inner thigh.  This does not correspond to a typical dermatomal pattern for lumbar radiculopathy.  She does not have new weakness or saddle anesthesia which is reassuring.  However she continues to be very symptomatic and is worsening despite recent epidural steroid injection on March 3.  Discussed options.  Plan to proceed with facet injections.  She has large facet joints with effusion that could be contribution to nerve compression.  Plan to target these joints.  Additionally the same time will refer to neurosurgery.  She is failing to improve surgical consideration may be her best option.   PDMP not reviewed this encounter. Orders Placed This Encounter  Procedures  . DG FACET JT INJ L /S SINGLE LEVEL LEFT W/FL/CT    Standing Status:   Future    Standing Expiration Date:   11/21/2021    Order Specific Question:   Reason for Exam (SYMPTOM  OR DIAGNOSIS REQUIRED)    Answer:   L4-5    Order Specific Question:   Is the patient pregnant?    Answer:   No    Order Specific Question:   Preferred Imaging Location?    Answer:   [facility]    Order Specific Question:   Radiology Contrast Protocol - do NOT remove file path    Answer:   \\[email or web address].pdf  . DG FACET JT INJ L /S SINGLE LEVEL RIGHT W/FL/CT    Standing Status:   Future    Standing Expiration Date:   11/21/2021    Order Specific Question:   Reason for Exam (SYMPTOM  OR DIAGNOSIS REQUIRED)    Answer:   L4-5    Order Specific Question:   Is the patient pregnant?    Answer:   No    Order Specific Question:   Preferred Imaging Location?    Answer:   [facility]    Order Specific Question:   Radiology Contrast Protocol - do NOT remove file path    Answer:    \\[email or web address]s.pdf  . Ambulatory referral to Neurosurgery    Referral Priority:   Routine    Referral Type:   Surgical    Referral Reason:   Specialty Services Required    Requested Specialty:   Neurosurgery    Number of Visits Requested:   1   No orders of the defined types were placed in this encounter.    Discussed warning signs or symptoms. Please see discharge instructions. Patient expresses understanding.   The above documentation has been reviewed and is accurate and complete [name], M.D.

## 2020-11-21 ENCOUNTER — Ambulatory Visit: Payer: BC Managed Care – PPO | Admitting: Family Medicine

## 2020-11-21 ENCOUNTER — Other Ambulatory Visit: Payer: Self-pay

## 2020-11-21 ENCOUNTER — Encounter: Payer: Self-pay | Admitting: Family Medicine

## 2020-11-21 VITALS — BP 132/84 | HR 96 | Ht 64.0 in | Wt 156.4 lb

## 2020-11-21 DIAGNOSIS — M5416 Radiculopathy, lumbar region: Secondary | ICD-10-CM

## 2020-11-21 DIAGNOSIS — M5442 Lumbago with sciatica, left side: Secondary | ICD-10-CM | POA: Diagnosis not present

## 2020-11-21 NOTE — Patient Instructions (Signed)
Thank you for coming in today.  ]Please call [facility] at [phone] to schedule your spine injection.   You should hear from Neurosurgery soon. Let me know if you do not hear anything.   Let me know if you need a refill.

## 2020-11-26 ENCOUNTER — Ambulatory Visit
Admission: RE | Admit: 2020-11-26 | Discharge: 2020-11-26 | Disposition: A | Discharge status: Home / Self Care | Payer: BC Managed Care – PPO | Source: Ambulatory Visit | Attending: Family Medicine | Admitting: Family Medicine

## 2020-11-26 ENCOUNTER — Other Ambulatory Visit: Payer: Self-pay

## 2020-11-26 ENCOUNTER — Other Ambulatory Visit: Payer: Self-pay | Admitting: Family Medicine

## 2020-11-26 DIAGNOSIS — M5442 Lumbago with sciatica, left side: Secondary | ICD-10-CM

## 2020-11-26 DIAGNOSIS — M5416 Radiculopathy, lumbar region: Secondary | ICD-10-CM

## 2020-11-26 DIAGNOSIS — M545 Low back pain, unspecified: Secondary | ICD-10-CM | POA: Diagnosis not present

## 2020-11-26 IMAGING — XA DG FACET JT INJ L OR S SPINE SINGLE LEVEL UNI
2 series · 2 of 2 positions shown · non-contrast
Comparison: none

CLINICAL DATA: Low back and left leg pain. No significant
improvement after left L5 nerve root block 18 days ago. Bilateral
L4-L5 facet injections requested.

[Series 1: ortho adipose · 1 of 1 slices shown (1 of 2)]
[im 1/1]
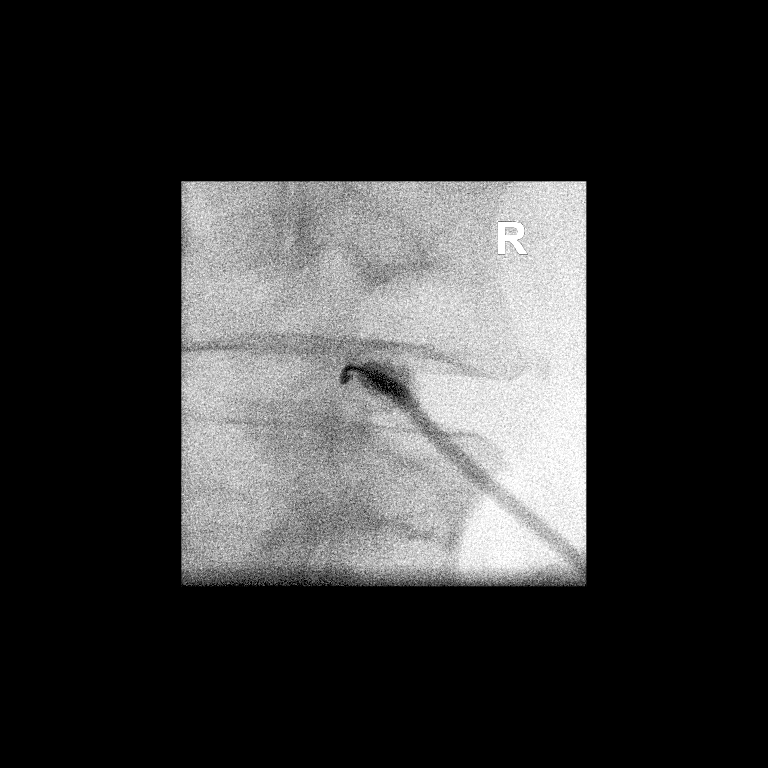

[Series 2: ortho adipose · 1 of 1 slices shown (2 of 2)]
[im 1/1]
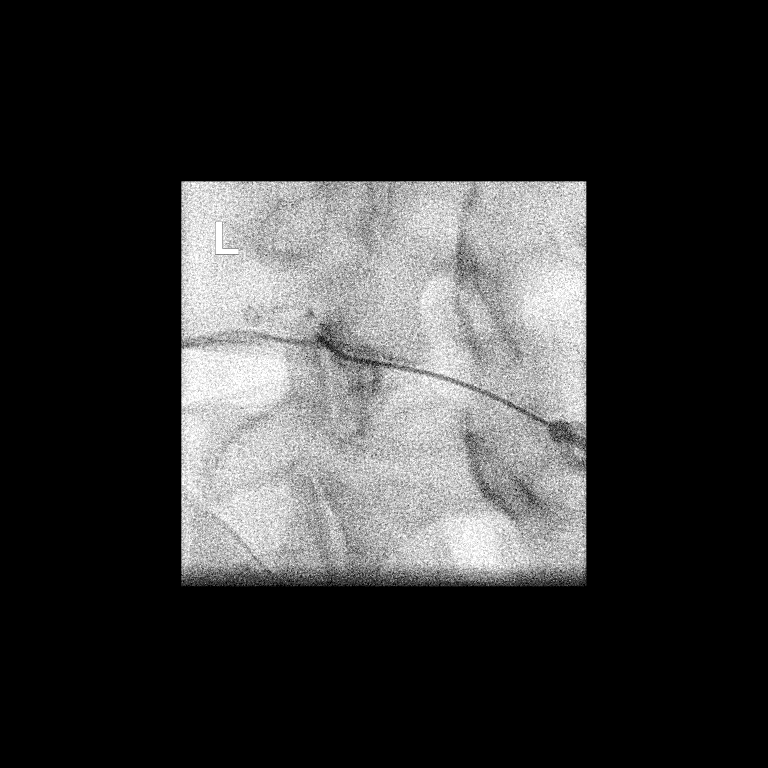

[2 of 2 positions shown; findings below may reference images not displayed]

EXAM:
FLUOROSCOPICALLY GUIDED BILATERAL L4-L5 FACET INJECTIONS

FLUOROSCOPY TIME:  Radiation Exposure Index (as provided by the
fluoroscopic device): 7.9 mGy

Fluoroscopy Time:  49 seconds

Number of Acquired Images:  0

PROCEDURE:
The procedure, risks, benefits, and alternatives were explained to
the patient. Questions regarding the procedure were encouraged and
answered. The patient understands and consents to the procedure.

RIGHT L4-L5 FACET INJECTION: A posterior oblique approach was taken
to the facet on the right at L4-L5 using a curved 3 inch 22 gauge
spinal needle. Prior CT from [DATE] demonstrates a large bony
spur overlying the facet joint, precluding intra-articular
placement. Therefore, juxta-articular positioning was confirmed by
injecting a small amount of Isovue-M 200. No vascular opacification
is seen. 60 mg of Depo-Medrol mixed with 0.75 mL of 0.25%
bupivacaine were instilled.

LEFT L4-L5 FACET INJECTION: A posterior oblique approach was taken
to the facet on the left at L4-L5 using a curved 3 inch 22 gauge
spinal needle. Intra-articular positioning was confirmed by
injecting a small amount of Isovue-M 200. No vascular opacification
is seen. While injecting 60 mg of Depo-Medrol mixed with 0.75 mL of
0.25% bupivacaine, the patient had significant pain concordance
radiating down the left leg to the foot, presumably due to filling
of a synovial cyst in the left lateral recess. About half of the
medication was instilled in the joint and the remaining half
instilled in a juxta-articular location.

The procedure was well-tolerated.
IMPRESSION: Technically successful bilateral L4-L5 facet injections.

## 2020-11-26 MED ORDER — METHYLPREDNISOLONE ACETATE 40 MG/ML INJ SUSP (RADIOLOG
120.0000 mg | Freq: Once | INTRAMUSCULAR | Status: AC
  Administered 2020-11-26: 120 mg via INTRA_ARTICULAR

## 2020-11-26 MED ORDER — IOPAMIDOL (ISOVUE-M 200) INJECTION 41%
1.0000 mL | Freq: Once | INTRAMUSCULAR | Status: AC
  Administered 2020-11-26: 1 mL via INTRA_ARTICULAR

## 2020-11-26 NOTE — Discharge Instructions (Signed)
Joint Injection Discharge Instructions  1. After your joint injection, use ice to the affected area for the next 24 hours as a temporary increase in pain is not uncommon for a day or two after your procedure.  2. Resume all medications unless otherwise instructed.  3. Common side effects of steroids include facial flushing or redness, restlessness or inability to sleep and an increase in your blood sugar if you are a diabetic.    4. Follow up with the ordering physician for post care.  5. If you have any of the following please call [phone]:      Temperature greater than 101     Pain, redness or swelling at the injection site   Thank you for visiting our office today.

## 2020-11-26 NOTE — Telephone Encounter (Signed)
Please advise 

## 2020-12-04 ENCOUNTER — Telehealth: Payer: Self-pay | Admitting: *Deleted

## 2020-12-04 DIAGNOSIS — M7138 Other bursal cyst, other site: Secondary | ICD-10-CM | POA: Diagnosis not present

## 2020-12-04 DIAGNOSIS — M4316 Spondylolisthesis, lumbar region: Secondary | ICD-10-CM | POA: Diagnosis not present

## 2020-12-04 DIAGNOSIS — M545 Low back pain, unspecified: Secondary | ICD-10-CM | POA: Diagnosis not present

## 2020-12-04 DIAGNOSIS — M5416 Radiculopathy, lumbar region: Secondary | ICD-10-CM | POA: Diagnosis not present

## 2020-12-04 NOTE — Telephone Encounter (Signed)
Pt called stating that she will need a refill on oxycodone by Friday 12/06/20. She met with the neurosurgeon today and may end up having surgery.

## 2020-12-05 MED ORDER — OXYCODONE-ACETAMINOPHEN 5-325 MG PO TABS: 1.0000 | ORAL_TABLET | ORAL | 0 refills | Status: DC | PRN

## 2020-12-05 NOTE — Telephone Encounter (Signed)
Pt called requesting refill. Please advise.

## 2020-12-05 NOTE — Telephone Encounter (Signed)
Refilled

## 2020-12-05 NOTE — Telephone Encounter (Signed)
Patient called following up on this request. 

## 2020-12-16 ENCOUNTER — Telehealth: Payer: Self-pay | Admitting: Family Medicine

## 2020-12-16 NOTE — Telephone Encounter (Signed)
Pt called again, will be out of meds after last dose today and was hopeful to get refill today.

## 2020-12-16 NOTE — Telephone Encounter (Signed)
Pt calling for refill of oxycodone.

## 2020-12-17 MED ORDER — OXYCODONE-ACETAMINOPHEN 5-325 MG PO TABS: 1.0000 | ORAL_TABLET | ORAL | 0 refills | Status: DC | PRN

## 2020-12-17 NOTE — Telephone Encounter (Signed)
Sent!

## 2020-12-17 NOTE — Telephone Encounter (Signed)
Left a VM to inform pt Dr. [name] sent in her rx refill request.

## 2020-12-30 ENCOUNTER — Telehealth: Payer: Self-pay | Admitting: Family Medicine

## 2020-12-30 MED ORDER — OXYCODONE-ACETAMINOPHEN 5-325 MG PO TABS: 1.0000 | ORAL_TABLET | ORAL | 0 refills | Status: DC | PRN

## 2020-12-30 NOTE — Telephone Encounter (Signed)
Reviewed opiate prescriptions.  Patient has been using quite a bit of opiates.  I am not sure that we are working towards a definitive goal for her pain management for her back.  She has had epidural steroid injections and facet injections.  Called and left a message asking for a dedicated phone call back to discuss what the long-term plan is.  I prescribed 10 pills instead of the usual 30 with a change of prescription saying that we need a phone call back.

## 2020-12-30 NOTE — Telephone Encounter (Signed)
Judith Drake called back.  She is scheduled for back surgery on May 5.  Okay to continue prescribing opiate until surgery.  Updated prescription sent to pharmacy.  PDMP reviewed during this encounter.

## 2020-12-30 NOTE — Telephone Encounter (Signed)
Pt needs refill of oxycodone, is completely out and in pain.

## 2021-01-08 ENCOUNTER — Telehealth: Payer: Self-pay | Admitting: Family Medicine

## 2021-01-08 NOTE — Telephone Encounter (Signed)
Patient called stating that she is having to get a therapy evaluation before her insurance will approve her surgery so they are having to put out the surgery date until this is done. She said that she will be out of her pain medication over the weekend. She is asking if she would be able to get a refill on oxyCODONE-acetaminophen (PERCOCET/ROXICET) 5-325 MG tablet send to [facility].  Please advise.

## 2021-01-09 MED ORDER — OXYCODONE-ACETAMINOPHEN 5-325 MG PO TABS: 1.0000 | ORAL_TABLET | ORAL | 0 refills | Status: DC | PRN

## 2021-01-09 NOTE — Telephone Encounter (Signed)
Left message informing patient.

## 2021-01-09 NOTE — Telephone Encounter (Signed)
Refilled

## 2021-01-13 ENCOUNTER — Other Ambulatory Visit: Payer: Self-pay

## 2021-01-13 ENCOUNTER — Ambulatory Visit: Payer: BC Managed Care – PPO | Attending: Neurosurgery | Admitting: Physical Therapy

## 2021-01-13 ENCOUNTER — Telehealth: Payer: Self-pay | Admitting: Physical Therapy

## 2021-01-13 ENCOUNTER — Encounter: Payer: Self-pay | Admitting: Physical Therapy

## 2021-01-13 DIAGNOSIS — M5442 Lumbago with sciatica, left side: Secondary | ICD-10-CM

## 2021-01-13 DIAGNOSIS — M25652 Stiffness of left hip, not elsewhere classified: Secondary | ICD-10-CM

## 2021-01-13 DIAGNOSIS — M6281 Muscle weakness (generalized): Secondary | ICD-10-CM

## 2021-01-13 NOTE — Addendum Note (Signed)
Addended by: Pollyann Samples on: 01/13/2021 05:27 PM   Modules accepted: Orders

## 2021-01-13 NOTE — Therapy (Addendum)
[place], [place], [address] Phone: [phone]   Fax:  [phone]  Physical Therapy Evaluation/Discharge  Patient Details  Name: Judith Drake MRN: [id number] Date of Birth: Jul 19, 1971 Referring Provider (PT): Consuella Lose   Encounter Date: 01/13/2021   PT End of Session - 01/13/21 1447    Visit Number 1    Number of Visits 6    Date for PT Re-Evaluation 02/24/21    PT Start Time 9381    PT Stop Time 1545    PT Time Calculation (min) 57 min    Activity Tolerance Patient limited by pain    Behavior During Therapy Restless           Past Medical History:  Diagnosis Date  . Abnormal uterine bleeding (AUB) 03/20/2019  . Acute lower UTI 05/12/2019  . Acute respiratory failure with hypoxia ([place]) 04/10/2018  . Anemia   . Arthritis    lower back and hips  . Dysmenorrhea 03/20/2019  . Elevated liver enzymes 11/24/2013  . ETOH abuse   . Fatty liver 10/03/2019  . Fibroid   . GERD (gastroesophageal reflux disease)   . Heavy menstrual period   . Hypertension   . Menorrhagia 12/13/2017  . SIRS (systemic inflammatory response syndrome) ([place]) 05/12/2019    Past Surgical History:  Procedure Laterality Date  . CYSTOSCOPY N/A 12/19/2019   Procedure: CYSTOSCOPY;  Surgeon: Aletha Halim, MD;  Location: [facility];  Service: Gynecology;  Laterality: N/A;  . ELBOW SURGERY     1997   . TOTAL LAPAROSCOPIC HYSTERECTOMY WITH SALPINGECTOMY Bilateral 12/19/2019   Procedure: TOTAL LAPAROSCOPIC HYSTERECTOMY WITH BILATERAL SALPINGECTOMY;  Surgeon: Aletha Halim, MD;  Location: [facility];  Service: Gynecology;  Laterality: Bilateral;  . TUBAL LIGATION      There were no vitals filed for this visit.    Subjective Assessment - 01/13/21 1455    Subjective 50 yo female reports several months of L glute down lateral leg to foot with "pins and needles" sensation, recently starting to be painful in L lumbar region in last  few weeks.  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  Patient would like to avoid surgery but needs to have something done to relieve the pain. Occasionally give way episodes of L LE.    How long can you sit comfortably? 1 min    How long can you stand comfortably? 5 min    How long can you walk comfortably? 10 min    Diagnostic tests MRI, XRAYs    Patient Stated Goals Pain relief    Currently in Pain? Yes    Pain Score 7    Max = 10/10 to nausea   Pain Location Leg    Pain Orientation Left;Lateral   Glute to foot   Pain Descriptors / Indicators Radiating;Sharp;Burning    Pain Type Chronic pain    Pain Radiating Towards L foot on lateral L LE    Pain Onset More than a month ago    Pain Frequency Constant    Aggravating Factors  Sitting worse than walking, standing, sleeping.    Pain Relieving Factors Hot showers, walking especillay when first awakened    Effect of Pain on Daily Activities Limiting all functional mobility as well as care for severely autistic son.    Multiple Pain Sites Yes    Pain Score 2    Pain Location Back    Pain Orientation Left    Pain  Descriptors / Indicators Aching    Pain Type Acute pain    Pain Onset 1 to 4 weeks ago    Pain Frequency Occasional              [facility] PT Assessment - 01/13/21 0001      Assessment   Medical Diagnosis Low back pain    Referring Provider (PT) Consuella Lose    Onset Date/Surgical Date 12/06/20    Next MD Visit Plan to schedule in 6 weeks    Prior Therapy NONE      Precautions   Precautions None      Balance Screen   Has the patient fallen in the past 6 months No    Has the patient had a decrease in activity level because of a fear of falling?  No    Is the patient reluctant to leave their home because of a fear of falling?  No      Home Environment   Living Environment Private residence    Living Arrangements Children   Caregiver for autistic son.     Prior Function   Level of  Independence Independent      Cognition   Overall Cognitive Status Within Functional Limits for tasks assessed      Observation/Other Assessments   Observations Patient restless, required repositioning every few minutes throughout session.    Focus on Therapeutic Outcomes (FOTO)  29   Predicted=54     Sensation   Light Touch Appears Intact      AROM   Lumbar Flexion 46cm   Finger tips to floor   Lumbar - Right Side Bend 48cm    Lumbar - Left Side Bend 54cm    Lumbar - Right Rotation WNL    Lumbar - Left Rotation WNL      Strength   Right Hip Flexion 4-/5   Pain limited   Right Hip Extension 2+/5   Pain limited   Right Hip ABduction 3-/5   Pain limited   Left Hip Flexion 3+/5   Pain limited   Left Hip Extension 2+/5   Pain limited   Left Hip ABduction 3-/5   Pain limited   Right Ankle Dorsiflexion 4/5   B heel raise x5   Left Ankle Dorsiflexion 4-/5   B heel raise x5     Palpation   Spinal mobility Hypermobile and painful lower thoracic and lumbar vert PA mobs    SI assessment  TTP L SI, comp/dist test negative    Palpation comment TTP lower thoracic and lumbar paraspinals, B piriformis region      Bed Mobility   Supine to Sit --   Extra time   Sit to Supine --   Extra time                     Objective measurements completed on examination: See above findings.       Between Adult PT Treatment/Exercise - 01/13/21 0001      Ambulation/Gait   Ambulation Distance (Feet) 30 Feet    Assistive device None    Gait Pattern --   Slow, stiff pace, decreased stride   Ambulation Surface Level      Lumbar Exercises: Stretches   Other Lumbar Stretch Exercise Prone on 3 pillows for 3 min      Lumbar Exercises: Supine   Pelvic Tilt 10 reps    Pelvic Tilt Limitations Pain limited ROM    Other Supine Lumbar Exercises Lower  trunk rotation x10ea small ROM    Other Supine Lumbar Exercises Hooklying hip add squeeze with towel x10      Manual Therapy   Manual Therapy  Soft tissue mobilization    Manual therapy comments Lumbar/Thor paraspinals                  PT Education - 01/13/21 1549    Education Details Evaluation results, POC, initial HEP with cues for perform to tolerance and eleminate any that cause increased pain with focus on prone on pillow for pain relief, use of heat as tolerated to relax musculature.    Person(s) Educated Patient    Methods Explanation;Demonstration;Verbal cues    Comprehension Verbalized understanding;Returned demonstration;Need further instruction;Verbal cues required            PT Short Term Goals - 01/13/21 1651      PT SHORT TERM GOAL #1   Title Patient will be independent and compliant with intitial HEP.    Baseline No exercise program    Time 2    Period Weeks    Status New    Target Date 01/27/21      PT SHORT TERM GOAL #2   Title FOTO survey will be discussed to allow patient understanding of assessment of functional progress.    Time 2    Period Weeks    Status New    Target Date 01/27/21             PT Long Term Goals - 01/13/21 1654      PT LONG TERM GOAL #1   Title Patient will be independent with final HEP and progression.    Baseline No exercise program    Time 6    Period Weeks    Status New    Target Date 02/24/21      PT LONG TERM GOAL #2   Title Patient will improve forward flexion to within 15 cm of fingertips to floor with no more than 2/10 pain to allow care of son.    Baseline 46cm    Time 6    Period Weeks    Status New    Target Date 02/24/21      PT LONG TERM GOAL #3   Title Patient will be able to raise 10 pound object to waist high shelf with no more than 2/10 pain to indicate improved ability to perform household chores.    Baseline Patient limited to picking up 3-4 pounds off floor.    Time 6    Period Weeks    Status New      PT LONG TERM GOAL #4   Title Patient will be able to drive >37 min with no more than 2/10 pain.    Baseline Patient pain  increases to >7/10 driving >5 min    Time 6    Period Weeks    Status New    Target Date 02/24/21      PT LONG TERM GOAL #5   Title FOTO score will increase to predicted score of 54 to indicate improved daily function.    Baseline 29    Time 6    Period Weeks    Status New    Target Date 02/24/21                  Plan - 01/13/21 1630    Clinical Impression Statement 50 yo female referred to OPPT with >3 months of L glute pain with radicular symptoms  progressing to lateral leg to foot with recent addition of L low back pain x1 week MRI indicating neuroforaminal stenosis, anterolisthesis at L4-L4 and lumbar facet cysts. Patient with limited pain relief from epidural injection and facet injection.  Patient quite painful throughout session, required changing positions every 2-3 min and limiting assessment.  Patient TTP lower thoracic to lumbar paraspinals, L SI region into glute/pirifomis.  Patient with limited trunk mobility with flexion and L worse than R sidebend. Pain exacerbated with minimal lumbar extension, mild pain relief while prone on pillows and required hooklying position to tolerate supine.  Patient educated on simple HEP as well as trying to use prone on pillows and heat or hot showers to calm pain in order to further assess at next visit.  Patient could benefit from skilled PT to address deficits with core stability to support lumbar spine.    Personal Factors and Comorbidities Comorbidity 1;Time since onset of injury/illness/exacerbation    Comorbidities HTN    Examination-Activity Limitations Bed Mobility;Bend;Caring for Others;Carry;Locomotion Level;Sit;Sleep;Squat;Stand    Examination-Participation Restrictions Driving    Stability/Clinical Decision Making Evolving/Moderate complexity    Clinical Decision Making Moderate    PT Frequency 1x / week    PT Duration 6 weeks    PT Treatment/Interventions ADLs/Self Care Home Management;Cryotherapy;Electrical  Stimulation;Moist Heat;Traction;Gait training;Functional mobility training;Therapeutic activities;Therapeutic exercise;Neuromuscular re-education;Manual techniques;Patient/family education;Passive range of motion;Spinal Manipulations;Joint Manipulations    PT Next Visit Plan Evaluation results, Ongoing assessment for pain aggrivation/relief, discuss effectiveness of HEP and progress as tolerated, perform FOTO if not completed by email.    PT Home Exercise Plan [id number]    Consulted and Agree with Plan of Care Patient           Patient will benefit from skilled therapeutic intervention in order to improve the following deficits and impairments:  Abnormal gait,Decreased endurance,Decreased mobility,Difficulty walking,Decreased range of motion,Pain,Decreased activity tolerance,Decreased strength,Hypermobility,Postural dysfunction  Visit Diagnosis: Acute left-sided low back pain with left-sided sciatica  Muscle weakness (generalized)  Stiffness of left hip, not elsewhere classified     Problem List Patient Active Problem List   Diagnosis Date Noted  . Hand pain 07/19/2020  . Dysplasia of cervix, 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  . Status post hysterectomy 12/19/2019  . Elevated transaminase level 12/18/2019  . Hypomagnesemia   . Nonallopathic lesion of sacral region 03/28/2018  . Nonallopathic lesion of thoracic region 03/28/2018  . Nonallopathic lesion of lumbosacral region 03/28/2018  . Nonallopathic lesion of pelvic region 03/28/2018  . Nonallopathic lesion of cervical region 03/28/2018  . Enlarged thyroid 03/07/2018  . 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 Lumbar radiculopathy, right 02/08/2018  . GAD (generalized anxiety disorder) 01/03/2018  . Rash 01/03/2018  . Right hip pain 01/03/2018  . GERD (gastroesophageal reflux disease) 12/13/2017  . Hypokalemia 12/13/2017  . Depression 11/24/2013  . Fatigue 07/12/2013  . HTN (hypertension) 07/12/2013  . Alcohol abuse  07/12/2013  . Tobacco abuse 07/12/2013  . Yeast infection of the skin 07/12/2013  . Routine general medical examination at a health care facility 07/12/2013    Pollyann Samples, PT 01/13/2021, 5:18 PM  [facility] [address] [place], [place], [address] Phone: [phone]   Fax:  [phone]  Name: LINETH THIELKE MRN: [id number] Date of Birth: [birth year]  PHYSICAL THERAPY DISCHARGE SUMMARY  Visits from Start of Care: 1  Current functional level related to goals / functional outcomes: As at evaluation   Remaining deficits: As at evaluation  Education / Equipment: Initial HEP, position of relief.  Plan: Patient agrees to discharge.  Patient goals were not met. Patient is being discharged due to the patient's request.  ?????        Pollyann Samples, PT

## 2021-01-15 ENCOUNTER — Other Ambulatory Visit: Payer: Self-pay | Admitting: Family

## 2021-01-15 DIAGNOSIS — F411 Generalized anxiety disorder: Secondary | ICD-10-CM

## 2021-01-16 ENCOUNTER — Other Ambulatory Visit: Payer: Self-pay | Admitting: Family

## 2021-01-16 ENCOUNTER — Telehealth: Payer: Self-pay | Admitting: Physical Therapy

## 2021-01-16 DIAGNOSIS — I10 Essential (primary) hypertension: Secondary | ICD-10-CM

## 2021-01-16 NOTE — Telephone Encounter (Signed)
Returned patient call. Patient states she is in significant pain since the evaluation on 01/13/2021 that is worse than before.  She is not sure if she should return to therapy.  Patient tried laying prone on pillows as a position for pain management and it is not helping.  This PT recommended patient call MD for recommendations.  She can come in and we could try other pain management techniques.  If she and/or doctor decide not to return on Monday, she will call to cancel.

## 2021-01-20 ENCOUNTER — Encounter: Payer: BC Managed Care – PPO | Admitting: Physical Therapy

## 2021-01-20 ENCOUNTER — Other Ambulatory Visit: Payer: Self-pay

## 2021-01-20 NOTE — Telephone Encounter (Signed)
Patient called to get a refill on oxycodone.

## 2021-01-21 MED ORDER — OXYCODONE-ACETAMINOPHEN 5-325 MG PO TABS: 1.0000 | ORAL_TABLET | ORAL | 0 refills | Status: DC | PRN

## 2021-01-27 ENCOUNTER — Telehealth: Payer: Self-pay | Admitting: Physical Therapy

## 2021-01-27 ENCOUNTER — Ambulatory Visit: Payer: BC Managed Care – PPO | Admitting: Physical Therapy

## 2021-01-27 NOTE — Telephone Encounter (Signed)
Called patient following missed appointment 01/27/2021.  Patient states she had reached out to her MD as we discussed on prior phone call.  Patient states MD agreed that therapy was not appropriate at this time secondary excruciating pain with PT and simple recommended positioning for pain management took 1 week to recover and still with significant pain as at initial evaluation, states she though MD would reach out to PT.  Patient agreed to discharge from PT.

## 2021-01-27 NOTE — Telephone Encounter (Signed)
Returned patient call, patient states she is in significant pain and unsure if she should continue PT.  Advised patient to reach out to MD and continue to try laying prone on pillows as a position of relief without doing the other HEP exercises.  Return to PT as directed by MD.

## 2021-01-30 ENCOUNTER — Telehealth: Payer: Self-pay | Admitting: Family Medicine

## 2021-01-30 NOTE — Telephone Encounter (Signed)
Patient called requesting a refill on oxyCODONE-acetaminophen (PERCOCET/ROXICET) 5-325 MG tablet. She said that she will be going out of town and will need it sent tomorrow if possible.

## 2021-01-31 MED ORDER — OXYCODONE-ACETAMINOPHEN 5-325 MG PO TABS: 1.0000 | ORAL_TABLET | ORAL | 0 refills | Status: DC | PRN

## 2021-01-31 NOTE — Telephone Encounter (Signed)
Refilled

## 2021-02-04 ENCOUNTER — Encounter: Payer: BC Managed Care – PPO | Admitting: Physical Therapy

## 2021-02-10 ENCOUNTER — Encounter: Payer: BC Managed Care – PPO | Admitting: Physical Therapy

## 2021-02-13 ENCOUNTER — Telehealth: Payer: Self-pay | Admitting: Family Medicine

## 2021-02-13 NOTE — Telephone Encounter (Signed)
Patient called requesting a refill on oxyCODONE-acetaminophen (PERCOCET/ROXICET) 5-325 MG tablet to be sent to [facility].

## 2021-02-14 MED ORDER — OXYCODONE-ACETAMINOPHEN 5-325 MG PO TABS: 1.0000 | ORAL_TABLET | ORAL | 0 refills | Status: DC | PRN

## 2021-02-14 NOTE — Telephone Encounter (Signed)
Oxycodone sent to pharmacy

## 2021-02-17 ENCOUNTER — Encounter: Payer: BC Managed Care – PPO | Admitting: Physical Therapy

## 2021-02-24 ENCOUNTER — Telehealth: Payer: Self-pay | Admitting: *Deleted

## 2021-02-24 MED ORDER — TIZANIDINE HCL 4 MG PO TABS: 4.0000 mg | ORAL_TABLET | Freq: Three times a day (TID) | ORAL | 1 refills | Status: DC | PRN

## 2021-02-24 NOTE — Telephone Encounter (Signed)
Spoke w/ pt to inform her you sent in the tizanadine and had previously refilled her oxycodone on 6/10. Pt reports she has been in "excruciating" pain this past week and has been taking the oxy every 4 hour and only has a 2 supply left. Pt notes she has the surgery scheduled for 03/06/21.

## 2021-02-24 NOTE — Telephone Encounter (Signed)
Oxycodone refilled on June 10.  I sent in tizanidine muscle relaxer today.  [place]

## 2021-02-24 NOTE — Telephone Encounter (Signed)
Pt is being rescheduled for surgery due to her Insurance requiring 6 wks of conservative therapy with PT.   Pt would like a refill on Oxycodone and be prescribed a muscle relaxer.

## 2021-02-25 ENCOUNTER — Encounter: Payer: BC Managed Care – PPO | Admitting: Physical Therapy

## 2021-02-25 MED ORDER — OXYCODONE-ACETAMINOPHEN 5-325 MG PO TABS: 1.0000 | ORAL_TABLET | ORAL | 0 refills | Status: DC | PRN

## 2021-02-25 NOTE — Addendum Note (Signed)
Addended by: Gregor Hams on: 02/25/2021 06:30 AM   Modules accepted: Orders

## 2021-02-25 NOTE — Telephone Encounter (Signed)
Oxycodone refilled.

## 2021-03-06 DIAGNOSIS — M7138 Other bursal cyst, other site: Secondary | ICD-10-CM | POA: Diagnosis not present

## 2021-03-06 DIAGNOSIS — M4316 Spondylolisthesis, lumbar region: Secondary | ICD-10-CM | POA: Diagnosis not present

## 2021-03-06 DIAGNOSIS — M5459 Other low back pain: Secondary | ICD-10-CM | POA: Diagnosis not present

## 2021-03-06 DIAGNOSIS — M47816 Spondylosis without myelopathy or radiculopathy, lumbar region: Secondary | ICD-10-CM | POA: Diagnosis not present

## 2021-03-07 DIAGNOSIS — M4316 Spondylolisthesis, lumbar region: Secondary | ICD-10-CM | POA: Diagnosis not present

## 2021-03-25 ENCOUNTER — Other Ambulatory Visit: Payer: Self-pay | Admitting: Family

## 2021-03-25 DIAGNOSIS — F411 Generalized anxiety disorder: Secondary | ICD-10-CM

## 2021-03-31 DIAGNOSIS — M4316 Spondylolisthesis, lumbar region: Secondary | ICD-10-CM | POA: Diagnosis not present

## 2021-06-07 DIAGNOSIS — M25512 Pain in left shoulder: Secondary | ICD-10-CM | POA: Diagnosis not present

## 2021-06-07 DIAGNOSIS — S2242XA Multiple fractures of ribs, left side, initial encounter for closed fracture: Secondary | ICD-10-CM | POA: Diagnosis not present

## 2021-06-09 ENCOUNTER — Ambulatory Visit (INDEPENDENT_AMBULATORY_CARE_PROVIDER_SITE_OTHER): Payer: BC Managed Care – PPO | Admitting: Family Medicine

## 2021-06-09 ENCOUNTER — Other Ambulatory Visit: Payer: Self-pay

## 2021-06-09 ENCOUNTER — Ambulatory Visit (INDEPENDENT_AMBULATORY_CARE_PROVIDER_SITE_OTHER): Payer: BC Managed Care – PPO

## 2021-06-09 ENCOUNTER — Ambulatory Visit: Payer: Self-pay

## 2021-06-09 VITALS — BP 118/82 | HR 99 | Ht 64.0 in | Wt 148.6 lb

## 2021-06-09 DIAGNOSIS — M19012 Primary osteoarthritis, left shoulder: Secondary | ICD-10-CM | POA: Diagnosis not present

## 2021-06-09 DIAGNOSIS — M25512 Pain in left shoulder: Secondary | ICD-10-CM | POA: Diagnosis not present

## 2021-06-09 IMAGING — DX DG SHOULDER 2+V*L*
3 series · 3 of 3 positions shown · non-contrast
Comparison: [DATE]

CLINICAL DATA: Left shoulder pain for 4 days.  No injury.

EXAM:
LEFT SHOULDER - 3 VIEW

[shoulder ap (1 of 2)]
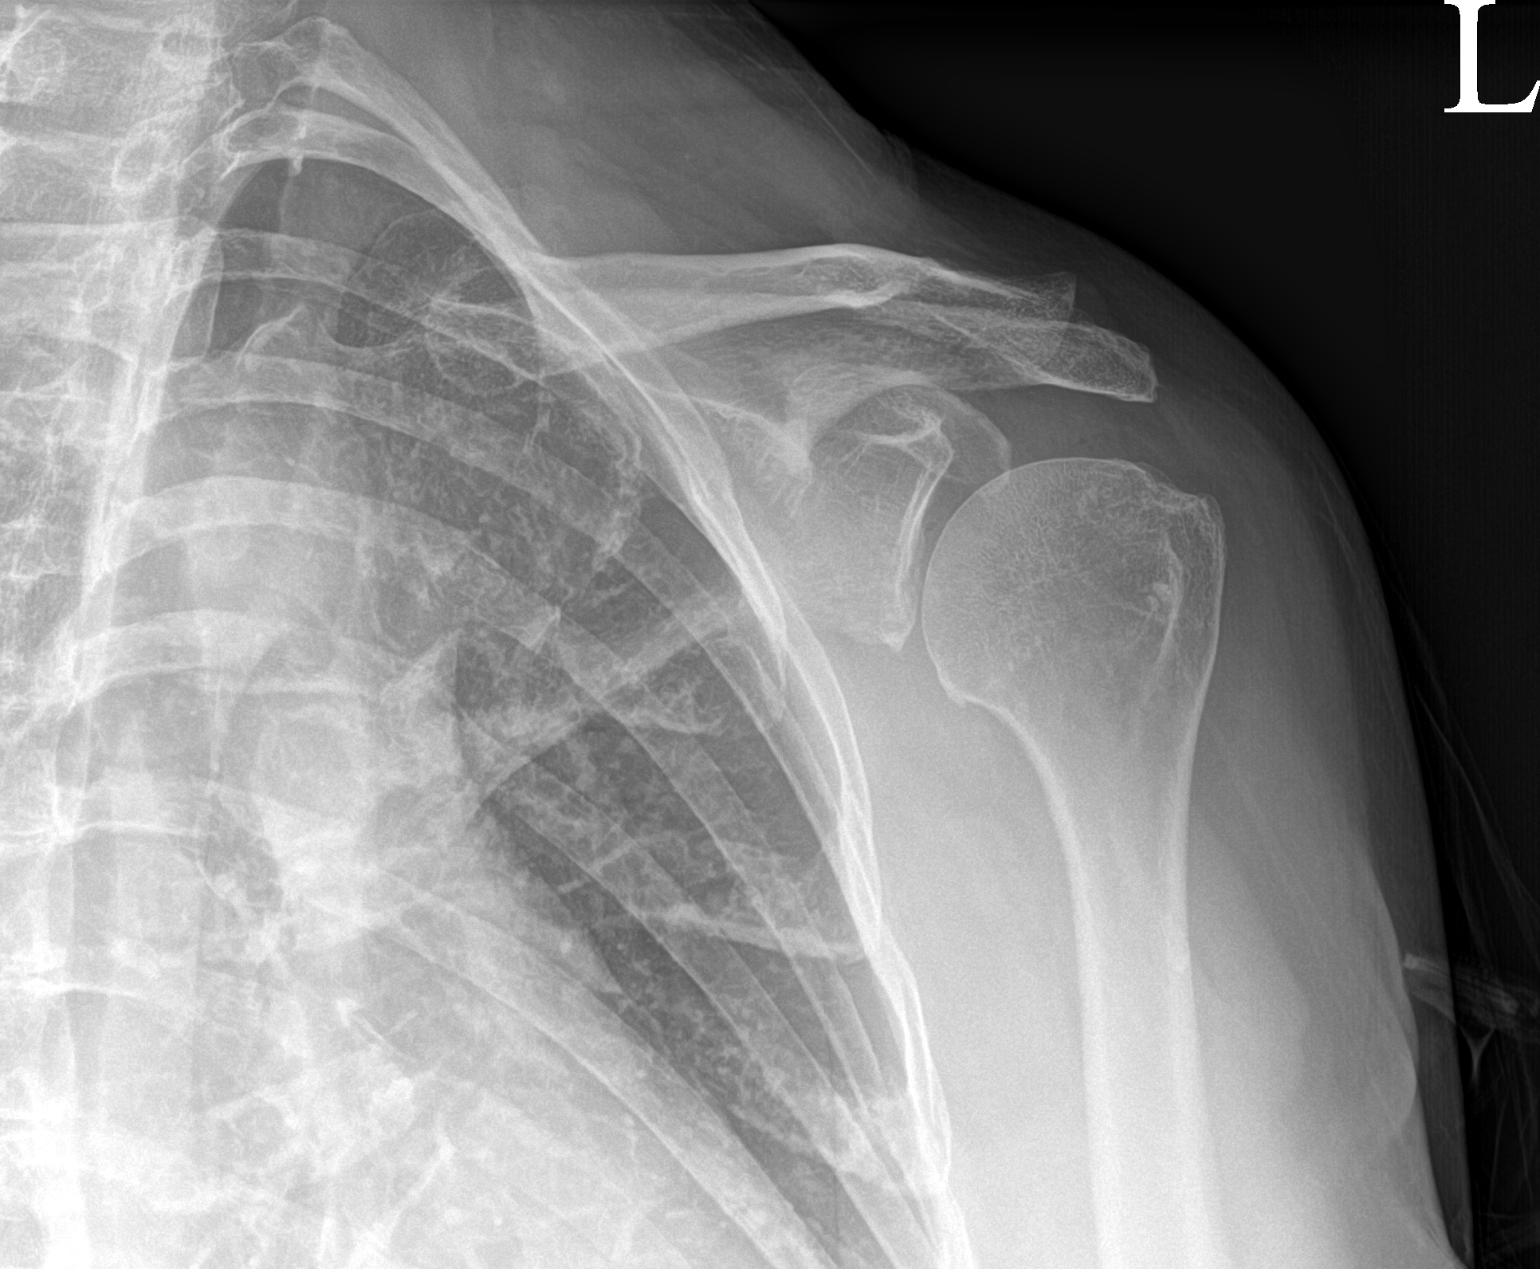

[shoulder ap (2 of 2)]
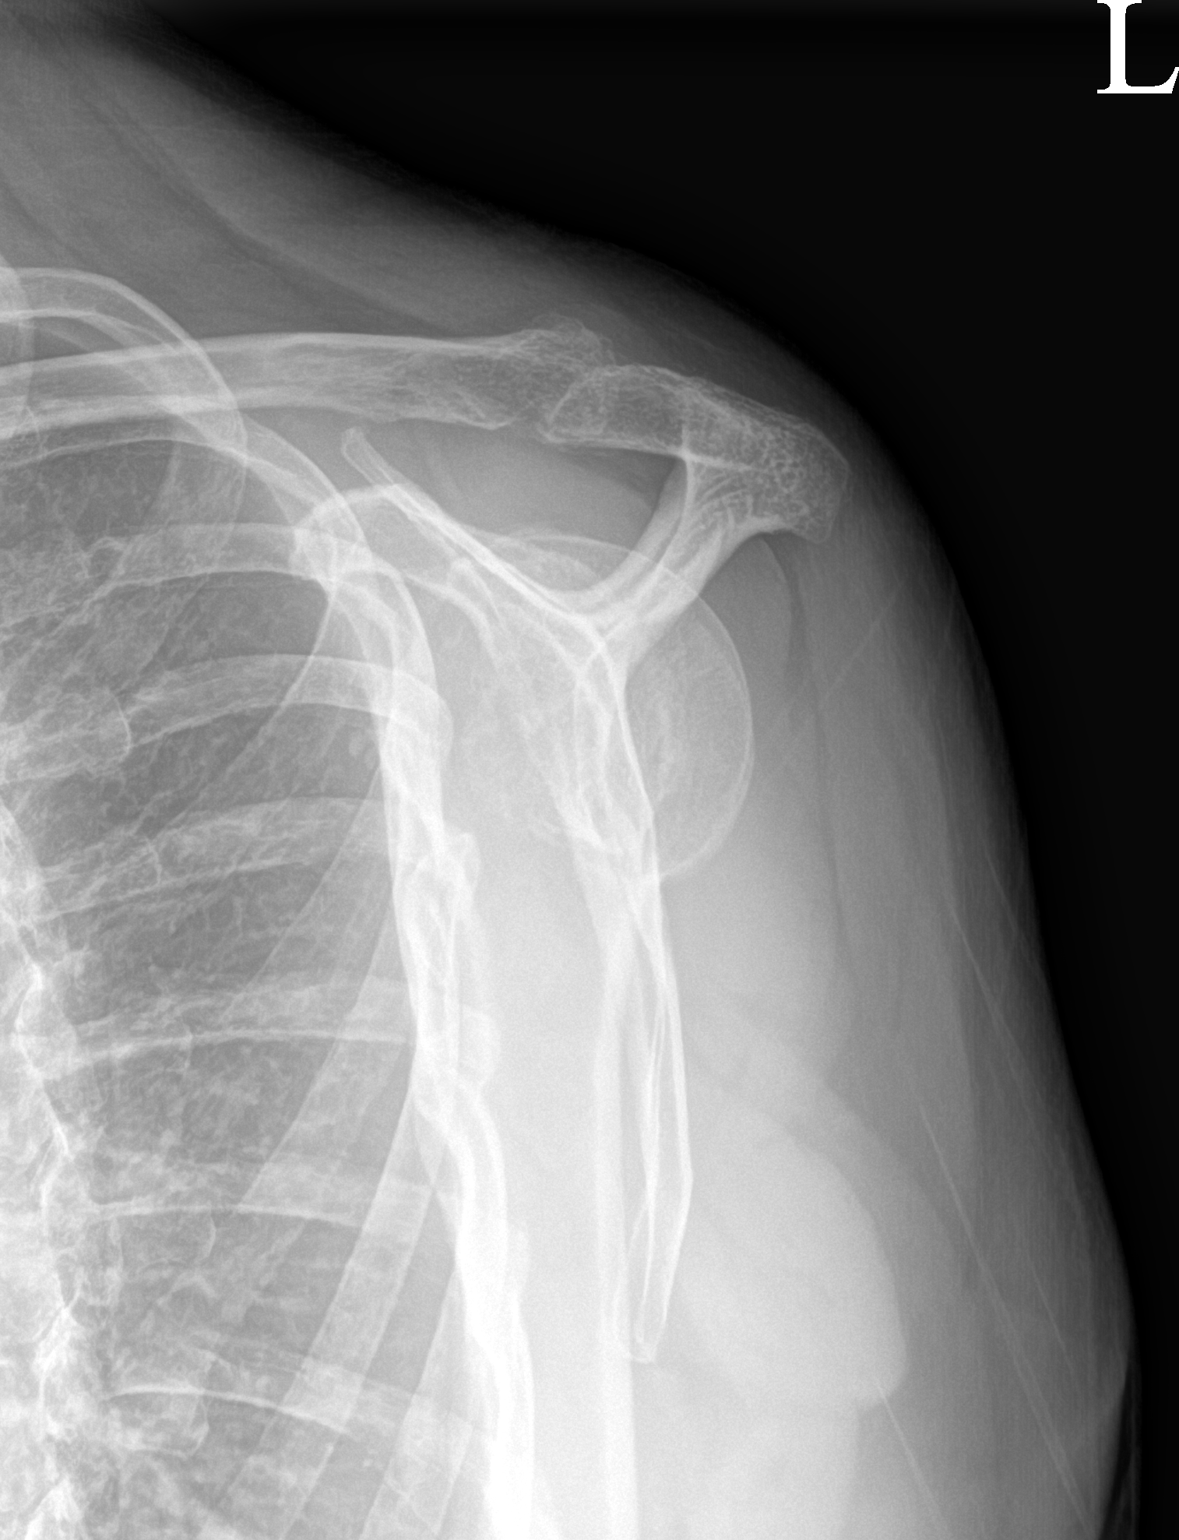

[shoulder axial]
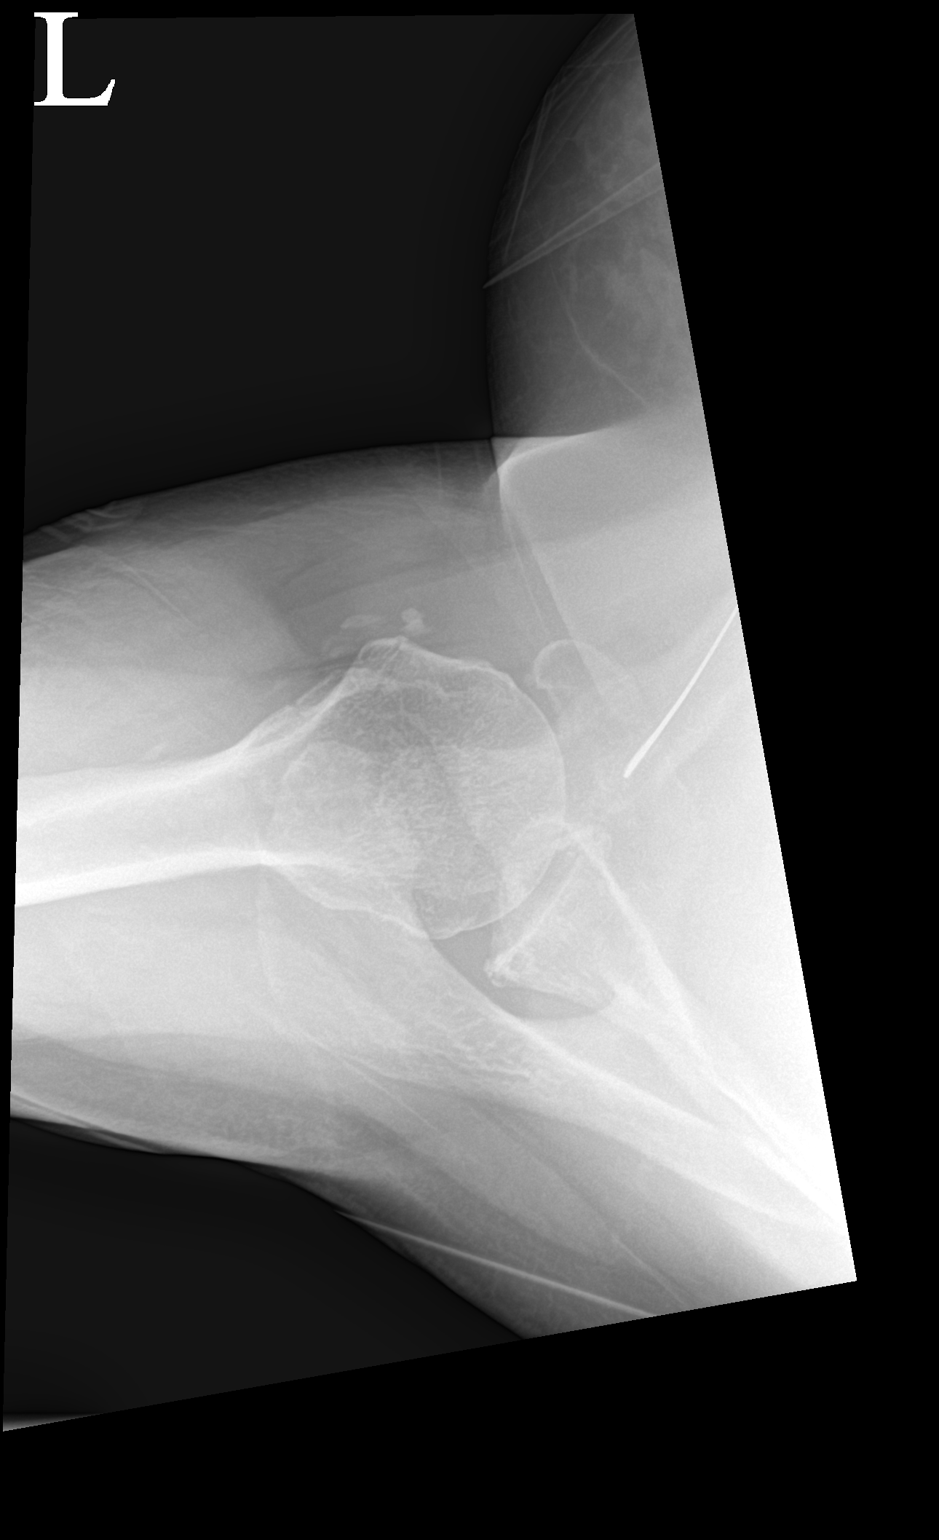

[3 of 3 positions shown; findings below may reference images not displayed]

FINDINGS: No fracture or bone lesion.

Mild narrowing of the inferior aspect of the glenohumeral joint.
Minimal marginal spurring from the inferior humeral head and
glenoid.

Mild narrowing of the AC joint with small marginal osteophytes.

Small calcific densities projecting anterior to the bicipital
groove. Soft tissues otherwise unremarkable.
IMPRESSION: 1. No fracture or acute finding.
2. Mild glenohumeral joint arthropathic changes and mild AC joint
osteoarthritis.
3. Small soft tissue calcifications projecting anterior to the
bicipital groove, nonspecific, but possibly calcific tendinitis
along the long head the biceps tendon.

## 2021-06-09 NOTE — Patient Instructions (Signed)
Thank you for coming in today.   Please get an Xray today before you leave   I've referred you to Physical Therapy.  Let us know if you don't hear from them in one week.   Recheck in 6 weeks.  Let me know if this is not working.   Adhesive Capsulitis Adhesive capsulitis, also called frozen shoulder, causes the shoulder to become stiff and painful to move. This condition happens when there is inflammation of the tendons and ligaments that surround the shoulder joint (shoulder capsule). What are the causes? This condition may be caused by: An injury to your shoulder joint. Straining your shoulder. Not moving your shoulder for a period of time. This can happen if your arm was injured or in a sling. Long-standing conditions, such as: Diabetes. Thyroid problems. Heart disease. Stroke. Rheumatoid arthritis. Lung disease. In some cases, the cause is not known. What increases the risk? You are more likely to develop this condition if you are: A woman. Older than 49 years of age. What are the signs or symptoms? Symptoms of this condition include: Pain in your shoulder when you move your arm. There may also be pain when parts of your shoulder are touched. The pain may be worse at night or when you are resting. A sore or aching shoulder. The inability to move your shoulder normally. Muscle spasms. How is this diagnosed? This condition is diagnosed with a physical exam and imaging tests, such as an X-ray or MRI. How is this treated? This condition may be treated with: Treatment of the underlying cause or condition. Medicine. Medicine may be given to relieve pain, inflammation, or muscle spasms. Steroid injections into the shoulder joint. Physical therapy. This involves performing exercises to get the shoulder moving again. Acupuncture. This is a type of treatment that involves stimulating specific points on your body by inserting thin needles through your skin. Shoulder manipulation.  This is a procedure to move the shoulder into another position. It is done after you are given a medicine to make you fall asleep (general anesthetic). The joint may also be injected with salt water at high pressure to break down scarring. Surgery. This may be done in severe cases when other treatments have failed. Although most people recover completely from adhesive capsulitis, some may not regain full shoulder movement. Follow these instructions at home: Managing pain, stiffness, and swelling   If directed, put ice on the injured area: Put ice in a plastic bag. Place a towel between your skin and the bag. Leave the ice on for 20 minutes, 2-3 times per day. If directed, apply heat to the affected area before you exercise. Use the heat source that your health care provider recommends, such as a moist heat pack or a heating pad. Place a towel between your skin and the heat source. Leave the heat on for 20-30 minutes. Remove the heat if your skin turns bright red. This is especially important if you are unable to feel pain, heat, or cold. You may have a greater risk of getting burned. General instructions Take over-the-counter and prescription medicines only as told by your health care provider. If you are being treated with physical therapy, follow instructions from your physical therapist. Avoid exercises that put a lot of demand on your shoulder, such as throwing. These exercises can make pain worse. Keep all follow-up visits as told by your health care provider. This is important. Contact a health care provider if: You develop new symptoms. Your symptoms get  worse. Summary Adhesive capsulitis, also called frozen shoulder, causes the shoulder to become stiff and painful to move. You are more likely to have this condition if you are a woman and over age 68. It is treated with physical therapy, medicines, and sometimes surgery. This information is not intended to replace advice given to you  by your health care provider. Make sure you discuss any questions you have with your health care provider. Document Revised: 01/28/2018 Document Reviewed: 01/28/2018 Elsevier Patient Education  [place].

## 2021-06-09 NOTE — Progress Notes (Signed)
I, [name], LAT, ATC acting as a scribe for [name], MD.  Judith Drake is a 50 y.o. female who presents to [place] at [facility] today for L shoulder pain ongoing since 06/06/21. Pt was previously seen by Dr. [name] on 11/21/20 for lumbar radiculopathy. Today, pt reports no MOI, pt just woke up in the morning and was unable to move her L arm. Pt was seen at [facility] on 06/07/21 and was advised to take the oxycodone and Flexeril she area had. Pt locates pain to the anterior aspect of L shoulder, into axilla, and around the L scapula area.   Radiates: yes UE Numbness/tingling: no UE Weakness: yes Aggravates: shoulder ABD Treatments tried: oxycodone, pain patch  Pertinent review of systems: No fevers or chills  Relevant historical information: Hypertension.  Back surgery about 3 months ago.   Exam:  BP 118/82   Pulse 99   Ht 5\' 4"  (1.626 m)   Wt 148 lb 9.6 oz (67.4 kg)   LMP  (LMP Unknown)   SpO2 99%   BMI 25.51 kg/m  General: Well Developed, well nourished, and in no acute distress.   MSK: Left shoulder normal-appearing Tender palpation anterior shoulder Range of motion significantly reduced external rotation abduction and internal rotation.  Abduction limited to 80 degrees.  Internal rotation posterior iliac crest and external rotation 20 degrees beyond neutral position. Strength intact within limits of motion. Negative Yergason's and speeds test.    Lab and Radiology Results  Procedure: Real-time Ultrasound Guided Injection of left shoulder glenohumeral joint posterior approach Device: Philips Affiniti 50G Images permanently stored and available for review in PACS Ultrasound evaluation prior to injection reveals intact biceps tendon. Hypoechoic fluid tracks superficial to the biceps tendon and soft tissue Intact rotator cuff tendons. Verbal informed consent obtained.  Discussed risks and benefits of procedure. Warned about  infection bleeding damage to structures skin hypopigmentation and fat atrophy among others. Patient expresses understanding and agreement Time-out conducted.   Noted no overlying erythema, induration, or other signs of local infection.   Skin prepped in a sterile fashion.   Local anesthesia: Topical Ethyl chloride.   With sterile technique and under real time ultrasound guidance: 40 mg of Kenalog and 2 mL lidocaine injected into glenohumeral joint. Fluid seen entering the joint capsule.   Completed without difficulty   Pain moderately resolved suggesting accurate placement of the medication.   Advised to call if fevers/chills, erythema, induration, drainage, or persistent bleeding.   Images permanently stored and available for review in the ultrasound unit.  Impression: Technically successful ultrasound guided injection.    06/08/2021 10:49 AM EDT   CLINICAL DATA:  Left shoulder pain, atraumatic, can not move arm. Initial encounter.  EXAM: LEFT SHOULDER - 2+ VIEW  COMPARISON:  None.  FINDINGS: AP internal and external views obtained. Normal acromioclavicular alignment. Glenohumeral joint is grossly congruent, though not well assessed on provided views. Small inferior spurs from the glenohumeral joint. No evidence of fracture or bony destruction. No soft tissue calcifications. Remote left rib fractures.  IMPRESSION: Mild glenohumeral osteoarthritis.   Electronically Signed  By: Keith Rake M.D.  On: 06/08/2021 10:49     Assessment and Plan: 50 y.o. female with left shoulder pain.  Pain occurred spontaneously without injury and is associated with significant lack of range of motion.  On ultrasound and on physical exam she has tenderness to palpation anterior shoulder associated with soft tissue hypoechoic change superficial to biceps  tendon on ultrasound.  The most likely explanation for her pain is adhesive capsulitis however the anterior shoulder pain does not  perfectly fit this diagnosis.  Plan to treat for adhesive capsulitis with glenohumeral joint injection and referral to physical therapy.  Recheck back in about 6 weeks.  If not improving next step would be MRI.  If worsening or dramatically not doing well in the interim MRI sooner.   PDMP not reviewed this encounter. Orders Placed This Encounter  Procedures   [place] LIMITED JOINT SPACE STRUCTURES UP LEFT(NO LINKED CHARGES)    Standing Status:   Future    Number of Occurrences:   1    Standing Expiration Date:   12/08/2021    Order Specific Question:   Reason for Exam (SYMPTOM  OR DIAGNOSIS REQUIRED)    Answer:   left shouler pain    Order Specific Question:   Preferred imaging location?    Answer:   [facility]   DG Shoulder Left    Standing Status:   Future    Number of Occurrences:   1    Standing Expiration Date:   06/09/2022    Order Specific Question:   Reason for Exam (SYMPTOM  OR DIAGNOSIS REQUIRED)    Answer:   left shouler pain    Order Specific Question:   Preferred imaging location?    Answer:   [facility]    Order Specific Question:   Is patient pregnant?    Answer:   No   Ambulatory referral to Physical Therapy    Referral Priority:   Routine    Referral Type:   Physical Medicine    Referral Reason:   Specialty Services Required    Requested Specialty:   Physical Therapy    Number of Visits Requested:   1   No orders of the defined types were placed in this encounter.    Discussed warning signs or symptoms. Please see discharge instructions. Patient expresses understanding.   The above documentation has been reviewed and is accurate and complete [name], M.D.

## 2021-06-10 NOTE — Progress Notes (Signed)
Left shoulder x-ray shows some mild arthritis changes in the shoulder joint and possible calcific changes in the front part of the shoulder where you do hurt.  Otherwise it looks okay with no injury or broken bone.  If not improving with the injection and physical therapy MRI would be helpful to figure this pain out.

## 2021-06-17 ENCOUNTER — Telehealth: Payer: Self-pay | Admitting: Family Medicine

## 2021-06-17 DIAGNOSIS — M25512 Pain in left shoulder: Secondary | ICD-10-CM

## 2021-06-17 NOTE — Telephone Encounter (Signed)
Patient called stating that the pain that she was previously having caused my inflammation has now moved to her shoulder blade. She described it as "on fire" and "like a knife is stabbing" her. She also mentioned that it has popped a couple times.  She asked if Dr [name] would want to go ahead and order the MRI or what would be best at this point?  Please advise.

## 2021-06-18 NOTE — Telephone Encounter (Signed)
Plan for shoulder MRI.  We may have trouble getting this authorized as you have not had a full trial of conservative management at this time.  However we will try our best to give it to your insurance company that you need this MRI.

## 2021-06-26 ENCOUNTER — Ambulatory Visit: Payer: BC Managed Care – PPO | Admitting: Rehabilitative and Restorative Service Providers"

## 2021-07-07 ENCOUNTER — Other Ambulatory Visit: Payer: Self-pay | Admitting: Family

## 2021-07-07 DIAGNOSIS — F411 Generalized anxiety disorder: Secondary | ICD-10-CM

## 2021-07-21 ENCOUNTER — Ambulatory Visit: Payer: BC Managed Care – PPO | Admitting: Family Medicine

## 2021-08-26 ENCOUNTER — Other Ambulatory Visit: Payer: Self-pay | Admitting: Family

## 2021-08-26 DIAGNOSIS — I10 Essential (primary) hypertension: Secondary | ICD-10-CM

## 2021-09-26 ENCOUNTER — Other Ambulatory Visit: Payer: Self-pay | Admitting: Family

## 2021-09-26 DIAGNOSIS — F411 Generalized anxiety disorder: Secondary | ICD-10-CM

## 2021-12-01 ENCOUNTER — Encounter (HOSPITAL_BASED_OUTPATIENT_CLINIC_OR_DEPARTMENT_OTHER): Payer: Self-pay

## 2021-12-01 ENCOUNTER — Other Ambulatory Visit: Payer: Self-pay

## 2021-12-01 ENCOUNTER — Emergency Department (HOSPITAL_BASED_OUTPATIENT_CLINIC_OR_DEPARTMENT_OTHER): Payer: BC Managed Care – PPO

## 2021-12-01 ENCOUNTER — Emergency Department (HOSPITAL_BASED_OUTPATIENT_CLINIC_OR_DEPARTMENT_OTHER)
Admission: EM | Admit: 2021-12-01 | Discharge: 2021-12-01 | Disposition: A | Discharge status: Home / Self Care | Payer: BC Managed Care – PPO | Attending: Emergency Medicine | Admitting: Emergency Medicine

## 2021-12-01 DIAGNOSIS — R5383 Other fatigue: Secondary | ICD-10-CM | POA: Diagnosis not present

## 2021-12-01 DIAGNOSIS — R059 Cough, unspecified: Secondary | ICD-10-CM | POA: Diagnosis not present

## 2021-12-01 DIAGNOSIS — D72829 Elevated white blood cell count, unspecified: Secondary | ICD-10-CM | POA: Diagnosis not present

## 2021-12-01 DIAGNOSIS — Z79899 Other long term (current) drug therapy: Secondary | ICD-10-CM | POA: Diagnosis not present

## 2021-12-01 DIAGNOSIS — R051 Acute cough: Secondary | ICD-10-CM | POA: Insufficient documentation

## 2021-12-01 DIAGNOSIS — R000 Tachycardia, unspecified: Secondary | ICD-10-CM | POA: Diagnosis not present

## 2021-12-01 DIAGNOSIS — E876 Hypokalemia: Secondary | ICD-10-CM | POA: Diagnosis not present

## 2021-12-01 DIAGNOSIS — I10 Essential (primary) hypertension: Secondary | ICD-10-CM | POA: Insufficient documentation

## 2021-12-01 DIAGNOSIS — Z20822 Contact with and (suspected) exposure to covid-19: Secondary | ICD-10-CM | POA: Diagnosis not present

## 2021-12-01 DIAGNOSIS — R0602 Shortness of breath: Secondary | ICD-10-CM | POA: Diagnosis not present

## 2021-12-01 LAB — COMPREHENSIVE METABOLIC PANEL
ALT: 23 U/L (ref 0–44)
AST: 30 U/L (ref 15–41)
Albumin: 4.5 g/dL (ref 3.5–5.0)
Alkaline Phosphatase: 85 U/L (ref 38–126)
Anion gap: 13 (ref 5–15)
BUN: 8 mg/dL (ref 6–20)
CO2: 27 mmol/L (ref 22–32)
Calcium: 9.3 mg/dL (ref 8.9–10.3)
Chloride: 95 mmol/L — ABNORMAL LOW (ref 98–111)
Creatinine, Ser: 0.61 mg/dL (ref 0.44–1.00)
GFR, Estimated: >60 mL/min (ref >60)
Glucose, Bld: 114 mg/dL — ABNORMAL HIGH (ref 70–99)
Potassium: 3 mmol/L — ABNORMAL LOW (ref 3.5–5.1)
Sodium: 135 mmol/L (ref 135–145)
Total Bilirubin: 0.6 mg/dL (ref 0.3–1.2)
Total Protein: 8.9 g/dL — ABNORMAL HIGH (ref 6.5–8.1)

## 2021-12-01 LAB — CBC WITH DIFFERENTIAL/PLATELET
Abs Immature Granulocytes: 0.12 10*3/uL — ABNORMAL HIGH (ref 0.00–0.07)
Basophils Absolute: 0.1 10*3/uL (ref 0.0–0.1)
Basophils Relative: 0 %
Eosinophils Absolute: 0 10*3/uL (ref 0.0–0.5)
Eosinophils Relative: 0 %
HCT: 39.7 % (ref 36.0–46.0)
Hemoglobin: 13.9 g/dL (ref 12.0–15.0)
Immature Granulocytes: 1 %
Lymphocytes Relative: 6 %
Lymphs Abs: 1 10*3/uL (ref 0.7–4.0)
MCH: 31.1 pg (ref 26.0–34.0)
MCHC: 35 g/dL (ref 30.0–36.0)
MCV: 88.8 fL (ref 80.0–100.0)
Monocytes Absolute: 1.2 10*3/uL — ABNORMAL HIGH (ref 0.1–1.0)
Monocytes Relative: 7 %
Neutro Abs: 14.2 10*3/uL — ABNORMAL HIGH (ref 1.7–7.7)
Neutrophils Relative %: 86 %
Platelets: 270 10*3/uL (ref 150–400)
RBC: 4.47 MIL/uL (ref 3.87–5.11)
RDW: 13.8 % (ref 11.5–15.5)
WBC: 16.6 10*3/uL — ABNORMAL HIGH (ref 4.0–10.5)
nRBC: 0 % (ref 0.0–0.2)

## 2021-12-01 LAB — GROUP A STREP BY PCR: Group A Strep by PCR: NOT DETECTED

## 2021-12-01 LAB — RESP PANEL BY RT-PCR (FLU A&B, COVID) ARPGX2
Influenza A by PCR: NEGATIVE
Influenza B by PCR: NEGATIVE
SARS Coronavirus 2 by RT PCR: NEGATIVE

## 2021-12-01 IMAGING — DX DG CHEST 1V PORT
1 series · 1 of 1 positions shown · non-contrast
Comparison: Chest radiograph [DATE]

CLINICAL DATA: Fatigue, cough, and shortness of breath for 3 days.

EXAM:
PORTABLE CHEST 1 VIEW

[chest ap]
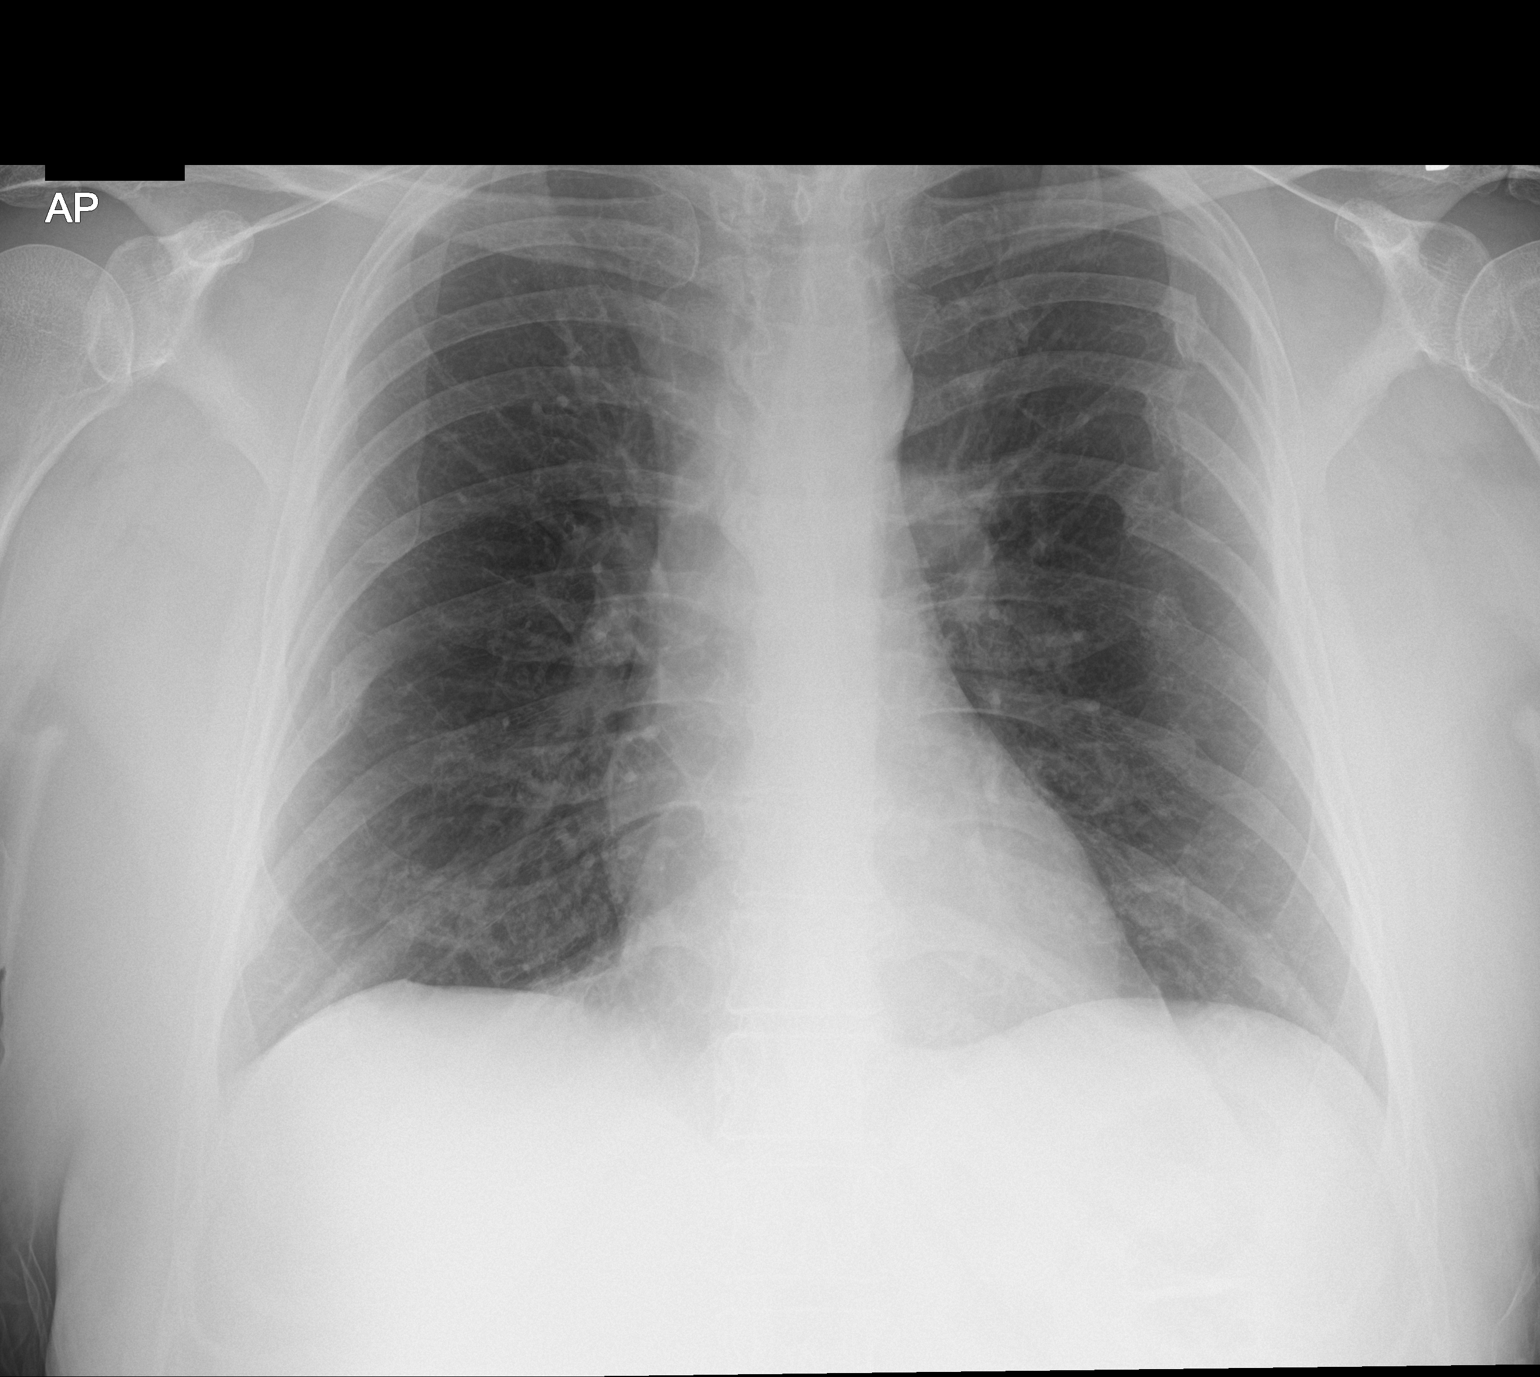

[1 of 1 positions shown; findings below may reference images not displayed]

FINDINGS: The cardiomediastinal silhouette is unchanged with normal heart
size. The lungs are well inflated. No airspace consolidation, edema,
pleural effusion, or pneumothorax is identified. Old bilateral rib
fractures are noted.
IMPRESSION: No active disease.

## 2021-12-01 MED ORDER — POTASSIUM CHLORIDE CRYS ER 20 MEQ PO TBCR: 20.0000 meq | EXTENDED_RELEASE_TABLET | Freq: Two times a day (BID) | ORAL | 0 refills | Status: DC

## 2021-12-01 MED ORDER — ACETAMINOPHEN 500 MG PO TABS
1000.0000 mg | ORAL_TABLET | Freq: Once | ORAL | Status: AC
  Administered 2021-12-01: 1000 mg via ORAL
  Filled 2021-12-01: qty 2

## 2021-12-01 MED ORDER — POTASSIUM CHLORIDE CRYS ER 20 MEQ PO TBCR
40.0000 meq | EXTENDED_RELEASE_TABLET | Freq: Once | ORAL | Status: AC
  Administered 2021-12-01: 40 meq via ORAL
  Filled 2021-12-01: qty 2

## 2021-12-01 NOTE — Discharge Instructions (Addendum)
Had low potassium on her work-up today.  Take supplements for the next week as prescribed, have it rechecked by your primary in the next few weeks.  Your work-up otherwise was unremarkable.  You are negative for COVID and flu, there is no bacterial pneumonia.  I suspect you have a viral process.  Your count was slightly elevated.  This can be a reaction to like a viral process, however you should definitely have this checked by your primary exam, you do your potassium.  If your symptoms worsen and you are having difficulty breathing he should return back to the ED for additional evaluation. ?

## 2021-12-01 NOTE — ED Notes (Signed)
Pt ambulatory to waiting room. Pt verbalized understanding of discharge instructions.   

## 2021-12-01 NOTE — ED Triage Notes (Signed)
Pt c/o flu like sx x 3 days-NAD-steady gait ?

## 2021-12-01 NOTE — ED Notes (Signed)
Pt states she does not take tylenol or ibuprofen-takes ASA for fever ?

## 2021-12-01 NOTE — ED Provider Notes (Signed)
?[facility] ?Provider Note ? ? ?CSN: [id number] ?Arrival date & time: 12/01/21  1411 ? ?  ? ?History ? ?Chief Complaint  ?Patient presents with  ? Cough  ? Weakness  ? ? ?Judith Drake is a 51 y.o. female. ? ? ?Cough ? ?Patient is a 51 year old female with history of elevated transaminitis, anemia, alcohol use disorder, hypertension presenting today with viral symptoms.  For the last 2 days she has been having cold, fevers, chills, body aches, sore throat.  She has not tried any Tylenol or Motrin at home.  She is vaccinated against COVID, has not had the flu vaccine.  Denies any shortness of breath or specific chest pain. ? ?Patient has been having generalized weakness for the past few days.  States last time she felt this way she had really severe anemia.  ? ?Home Medications ?Prior to Admission medications   ?Medication Sig Start Date End Date Taking? Authorizing Provider  ?potassium chloride SA (KLOR-CON M) 20 MEQ tablet Take 1 tablet (20 mEq total) by mouth 2 (two) times daily. 12/01/21  Yes Sherrill Raring, PA-C  ?amLODipine (NORVASC) 10 MG tablet TAKE 1 TABLET BY MOUTH ONCE DAILY 08/26/21   Burnard Hawthorne, FNP  ?Aspirin-Caffeine 845-65 MG PACK Take 1 Package by mouth daily as needed (pain).    [provider]  ?busPIRone (BUSPAR) 10 MG tablet TAKE 1 TABLET BY MOUTH 3 TIMES DAILY 09/26/21   Burnard Hawthorne, FNP  ?cyclobenzaprine (FLEXERIL) 10 MG tablet Take 10 mg by mouth 3 (three) times daily as needed for muscle spasms.    [provider]  ?gabapentin (NEURONTIN) 300 MG capsule Take 1 capsule (300 mg total) by mouth 3 (three) times daily as needed. 10/21/20   Gregor Hams, MD  ?oxyCODONE-acetaminophen (PERCOCET/ROXICET) 5-325 MG tablet Take 1 tablet by mouth every 4 (four) hours as needed for severe pain. 02/25/21   Gregor Hams, MD  ?tiZANidine (ZANAFLEX) 4 MG tablet Take 1 tablet (4 mg total) by mouth every 8 (eight) hours as needed for muscle  spasms. ?Patient not taking: Reported on 06/09/2021 02/24/21   Gregor Hams, MD  ?   ? ?Allergies    ?Amoxicillin, Dilaudid [hydromorphone hcl], and Morphine and related   ? ?Review of Systems   ?Review of Systems  ?Respiratory:  Positive for cough.   ? ?Physical Exam ?Updated Vital Signs ?BP 134/80   Pulse (!) 112   Temp 100.1 ?F (37.8 ?C)   Resp 17   Ht '5\' 3"'$  (1.6 m)   Wt 68.9 kg   LMP  (LMP Unknown)   SpO2 96%   BMI 26.93 kg/m?  ?Physical Exam ?Vitals and nursing note reviewed. Exam conducted with a chaperone present.  ?Constitutional:   ?   Appearance: Normal appearance.  ?HENT:  ?   Head: Normocephalic and atraumatic.  ?   Mouth/Throat:  ?   Pharynx: Posterior oropharyngeal erythema present.  ?   Comments: Uvula is midline, posterior oropharynx with some erythema. ?Eyes:  ?   General: No scleral icterus.    ?   Right eye: No discharge.     ?   Left eye: No discharge.  ?   Extraocular Movements: Extraocular movements intact.  ?   Pupils: Pupils are equal, round, and reactive to light.  ?Cardiovascular:  ?   Rate and Rhythm: Regular rhythm. Tachycardia present.  ?   Pulses: Normal pulses.  ?   Heart sounds: Normal heart sounds. No  murmur heard. ?  No friction rub. No gallop.  ?Pulmonary:  ?   Effort: Pulmonary effort is normal. No respiratory distress.  ?   Breath sounds: Normal breath sounds.  ?Abdominal:  ?   General: Abdomen is flat. Bowel sounds are normal. There is no distension.  ?   Palpations: Abdomen is soft.  ?   Tenderness: There is no abdominal tenderness.  ?Skin: ?   General: Skin is warm and dry.  ?   Coloration: Skin is not jaundiced.  ?Neurological:  ?   Mental Status: She is alert. Mental status is at baseline.  ?   Coordination: Coordination normal.  ? ? ?ED Results / Procedures / Treatments   ?Labs ?(all labs ordered are listed, but only abnormal results are displayed) ?Labs Reviewed  ?CBC WITH DIFFERENTIAL/PLATELET - Abnormal; Notable for the following components:  ?    Result Value  ?  WBC 16.6 (*)   ? Neutro Abs 14.2 (*)   ? Monocytes Absolute 1.2 (*)   ? Abs Immature Granulocytes 0.12 (*)   ? All other components within normal limits  ?COMPREHENSIVE METABOLIC PANEL - Abnormal; Notable for the following components:  ? Potassium 3.0 (*)   ? Chloride 95 (*)   ? Glucose, Bld 114 (*)   ? Total Protein 8.9 (*)   ? All other components within normal limits  ?RESP PANEL BY RT-PCR (FLU A&B, COVID) [id number]  ?GROUP A STREP BY PCR  ? ? ?EKG ?EKG Interpretation ? ?Date/Time:  Monday December 01 2021 15:04:13 EDT ?Ventricular Rate:  118 ?PR Interval:  118 ?QRS Duration: 94 ?QT Interval:  321 ?QTC Calculation: 450 ?R Axis:   15 ?Text Interpretation: Sinus tachycardia no acute ST/T changes similar to April 2021 Confirmed by Sherwood Gambler [phone]) on 12/01/2021 3:11:43 PM ? ?Radiology ?DG Chest Port 1 View ? ?Result Date: 12/01/2021 ?CLINICAL DATA:  Fatigue, cough, and shortness of breath for 3 days. EXAM: PORTABLE CHEST 1 VIEW COMPARISON:  Chest radiograph 05/11/2019 FINDINGS: The cardiomediastinal silhouette is unchanged with normal heart size. The lungs are well inflated. No airspace consolidation, edema, pleural effusion, or pneumothorax is identified. Old bilateral rib fractures are noted. IMPRESSION: No active disease. Electronically Signed   By: Logan Bores M.D.   On: 12/01/2021 16:23   ? ?Procedures ?Procedures  ? ? ?Medications Ordered in ED ?Medications  ?acetaminophen (TYLENOL) tablet 1,000 mg (1,000 mg Oral Given 12/01/21 1509)  ?potassium chloride SA (KLOR-CON M) CR tablet 40 mEq (40 mEq Oral Given 12/01/21 1645)  ? ? ?ED Course/ Medical Decision Making/ A&P ?  ?                        ?Medical Decision Making ?Amount and/or Complexity of Data Reviewed ?Labs: ordered. ?Radiology: ordered. ? ?Risk ?OTC drugs. ?Prescription drug management. ? ? ?This is a 51 year old female presenting due to viral symptoms.  Differential diagnosis includes virus, bacterial pneumonia, arrhythmia, other. ? ?Independent  history obtained per chart review.  During her office visits with her primary and sports medicine patient has a high resting heart rate typically ranging between 95-98. ? ?On exam patient is tachycardic with regular rhythm.  Lungs are clear to auscultation bilaterally, posterior oropharynx with erythema.  No tonsillar exudate or swelling.  Uvula is midline. She is febrile with tachycardia likely reactionary to the fever.   ? ?She is not taking any antipyretics, ordered Tylenol. ? ?EKG ordered and viewed by myself.  Patient has  sinus tachycardia, there are no changes when compared to previous EKGs.  Pacifically, there were no ischemic findings.  No pattern to be concerning for right heart strain. ? ?Chest x-ray viewed.  I agree with radiologist interpretation, there is no acute finding. ? ?I ordered labs.  Viewed them and per my interpretation the following is relevant: Patient is mildly hypokalemic at 3.0.  She also has a leukocytosis of 16.6.  She is COVID and flu negative.  Strep negative. ? ?I suspect leukocytosis is reactionary to underlying viral process.  She is mildly hypokalemic so we will proceed to treat that.  Her liver enzymes actually improved compared to prior laboratory work-up.  I encouraged patient to continue taking Tylenol and Motrin for fever.   ? ?Given her stability and directed improvement in symptoms I do not really feel she needs to come into the hospital for any observation or admission.  Do not feel she needs additional work-up at this time.  We discussed return precautions, discharged stable condition. ? ? ? ? ? ? ? ?Final Clinical Impression(s) / ED Diagnoses ?Final diagnoses:  ?Acute cough  ?Hypokalemia  ? ? ?Rx / DC Orders ?ED Discharge Orders   ? ?      Ordered  ?  potassium chloride SA (KLOR-CON M) 20 MEQ tablet  2 times daily       ? 12/01/21 1641  ? ?  ?  ? ?  ? ? ?  ?Sherrill Raring, PA-C ?12/01/21 1655 ? ?  ?Sherwood Gambler, MD ?12/11/21 [phone] ? ?

## 2021-12-03 ENCOUNTER — Telehealth: Payer: Self-pay | Admitting: Family

## 2021-12-03 ENCOUNTER — Other Ambulatory Visit: Payer: Self-pay

## 2021-12-03 DIAGNOSIS — Z0000 Encounter for general adult medical examination without abnormal findings: Secondary | ICD-10-CM

## 2021-12-03 DIAGNOSIS — E611 Iron deficiency: Secondary | ICD-10-CM

## 2021-12-03 NOTE — Telephone Encounter (Signed)
Pt called in to schedule her annual physical... Pt requesting for labs... no lab orders in system... Pt requesting callback...  ?

## 2021-12-03 NOTE — Telephone Encounter (Signed)
Labs ordered.

## 2021-12-08 ENCOUNTER — Ambulatory Visit: Payer: BC Managed Care – PPO | Admitting: Family Medicine

## 2021-12-08 ENCOUNTER — Ambulatory Visit (INDEPENDENT_AMBULATORY_CARE_PROVIDER_SITE_OTHER): Payer: BC Managed Care – PPO

## 2021-12-08 ENCOUNTER — Ambulatory Visit: Payer: Self-pay

## 2021-12-08 VITALS — BP 124/82 | HR 103 | Ht 63.0 in | Wt 148.0 lb

## 2021-12-08 DIAGNOSIS — M79671 Pain in right foot: Secondary | ICD-10-CM

## 2021-12-08 DIAGNOSIS — M25512 Pain in left shoulder: Secondary | ICD-10-CM

## 2021-12-08 DIAGNOSIS — G8929 Other chronic pain: Secondary | ICD-10-CM | POA: Diagnosis not present

## 2021-12-08 DIAGNOSIS — M79672 Pain in left foot: Secondary | ICD-10-CM | POA: Diagnosis not present

## 2021-12-08 LAB — CBC WITH DIFFERENTIAL/PLATELET
Basophils Absolute: 0.1 10*3/uL (ref 0.0–0.1)
Basophils Relative: 0.7 % (ref 0.0–3.0)
Eosinophils Absolute: 0.3 10*3/uL (ref 0.0–0.7)
Eosinophils Relative: 3.3 % (ref 0.0–5.0)
HCT: 43.1 % (ref 36.0–46.0)
Hemoglobin: 14.3 g/dL (ref 12.0–15.0)
Lymphocytes Relative: 19.6 % (ref 12.0–46.0)
Lymphs Abs: 2 10*3/uL (ref 0.7–4.0)
MCHC: 33.2 g/dL (ref 30.0–36.0)
MCV: 93 fl (ref 78.0–100.0)
Monocytes Absolute: 0.9 10*3/uL (ref 0.1–1.0)
Monocytes Relative: 8.7 % (ref 3.0–12.0)
Neutro Abs: 6.9 10*3/uL (ref 1.4–7.7)
Neutrophils Relative %: 67.7 % (ref 43.0–77.0)
Platelets: 451 10*3/uL — ABNORMAL HIGH (ref 150.0–400.0)
RBC: 4.63 Mil/uL (ref 3.87–5.11)
RDW: 14.1 % (ref 11.5–15.5)
WBC: 10.2 10*3/uL (ref 4.0–10.5)

## 2021-12-08 LAB — SEDIMENTATION RATE: Sed Rate: 73 mm/hr — ABNORMAL HIGH (ref 0–20)

## 2021-12-08 IMAGING — DX DG OS CALCIS 2+V*L*
2 series · 2 of 2 positions shown · non-contrast
Comparison: Left ankle dated [DATE]

CLINICAL DATA: Bilateral calcaneal pain since this morning. No
known injury.

EXAM:
LEFT OS CALCIS - 2+ VIEW

[calcaneus axial]
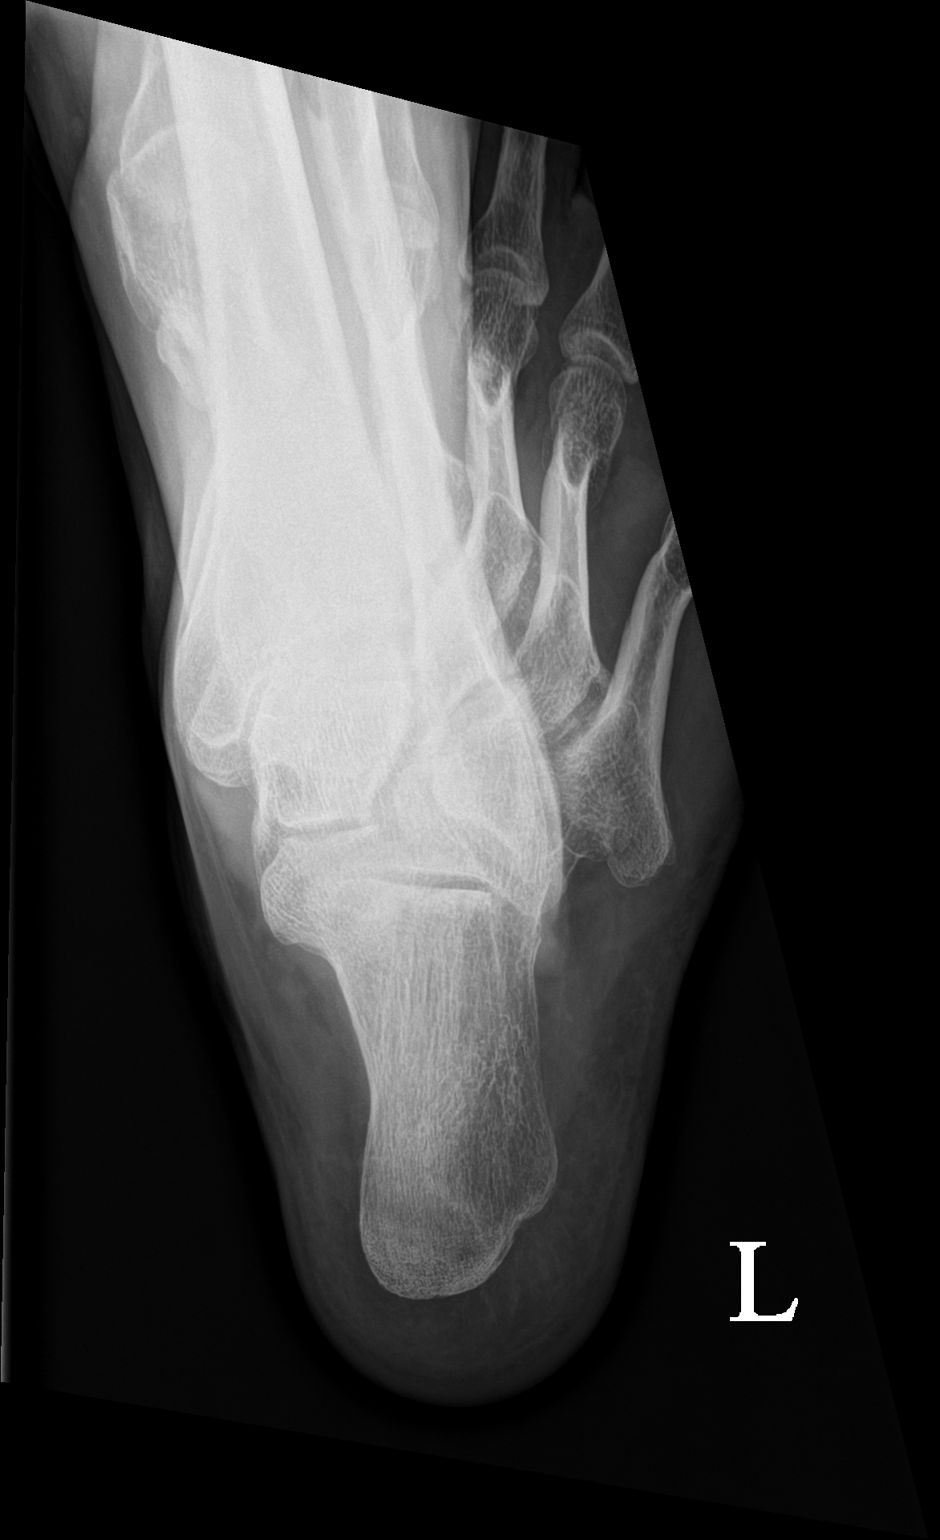

[calcaneus lat]
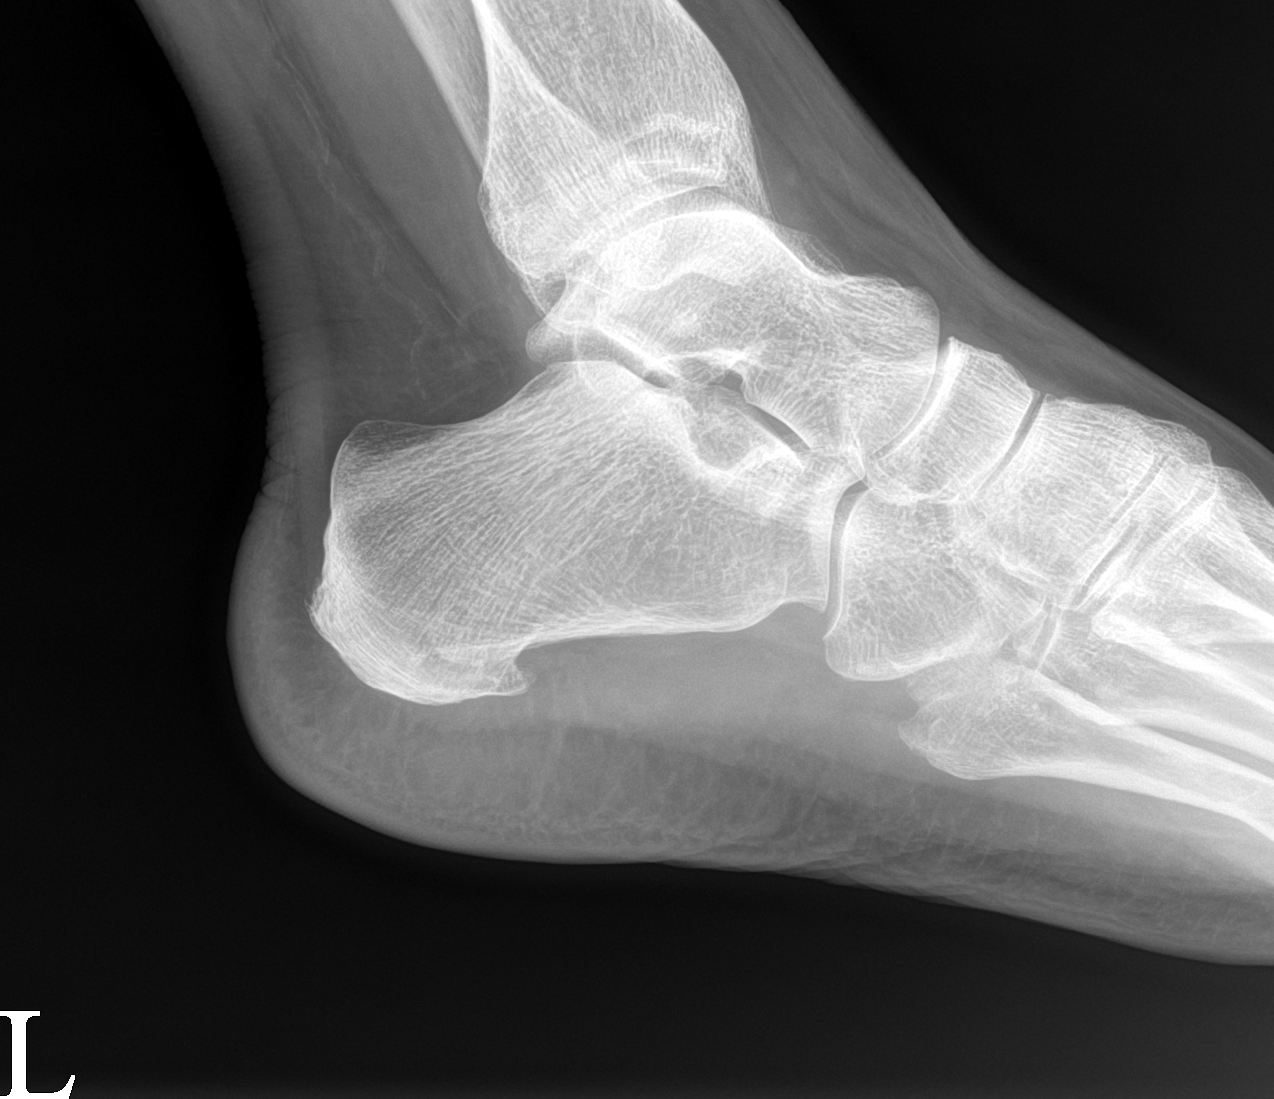

[2 of 2 positions shown; findings below may reference images not displayed]

FINDINGS: Stable moderate-sized inferior and small posterior calcaneal
enthesophytes. No fracture, dislocation or ankle effusion.
IMPRESSION: No acute abnormality.  Stable calcaneal enthesophytes.

## 2021-12-08 IMAGING — DX DG OS CALCIS 2+V*R*
2 series · 2 of 2 positions shown · non-contrast
Comparison: Left os calcis obtained at the same time.

CLINICAL DATA: Bilateral calcaneal pain since this morning. No
known injury.

EXAM:
RIGHT OS CALCIS - 2+ VIEW

[calcaneus axial]
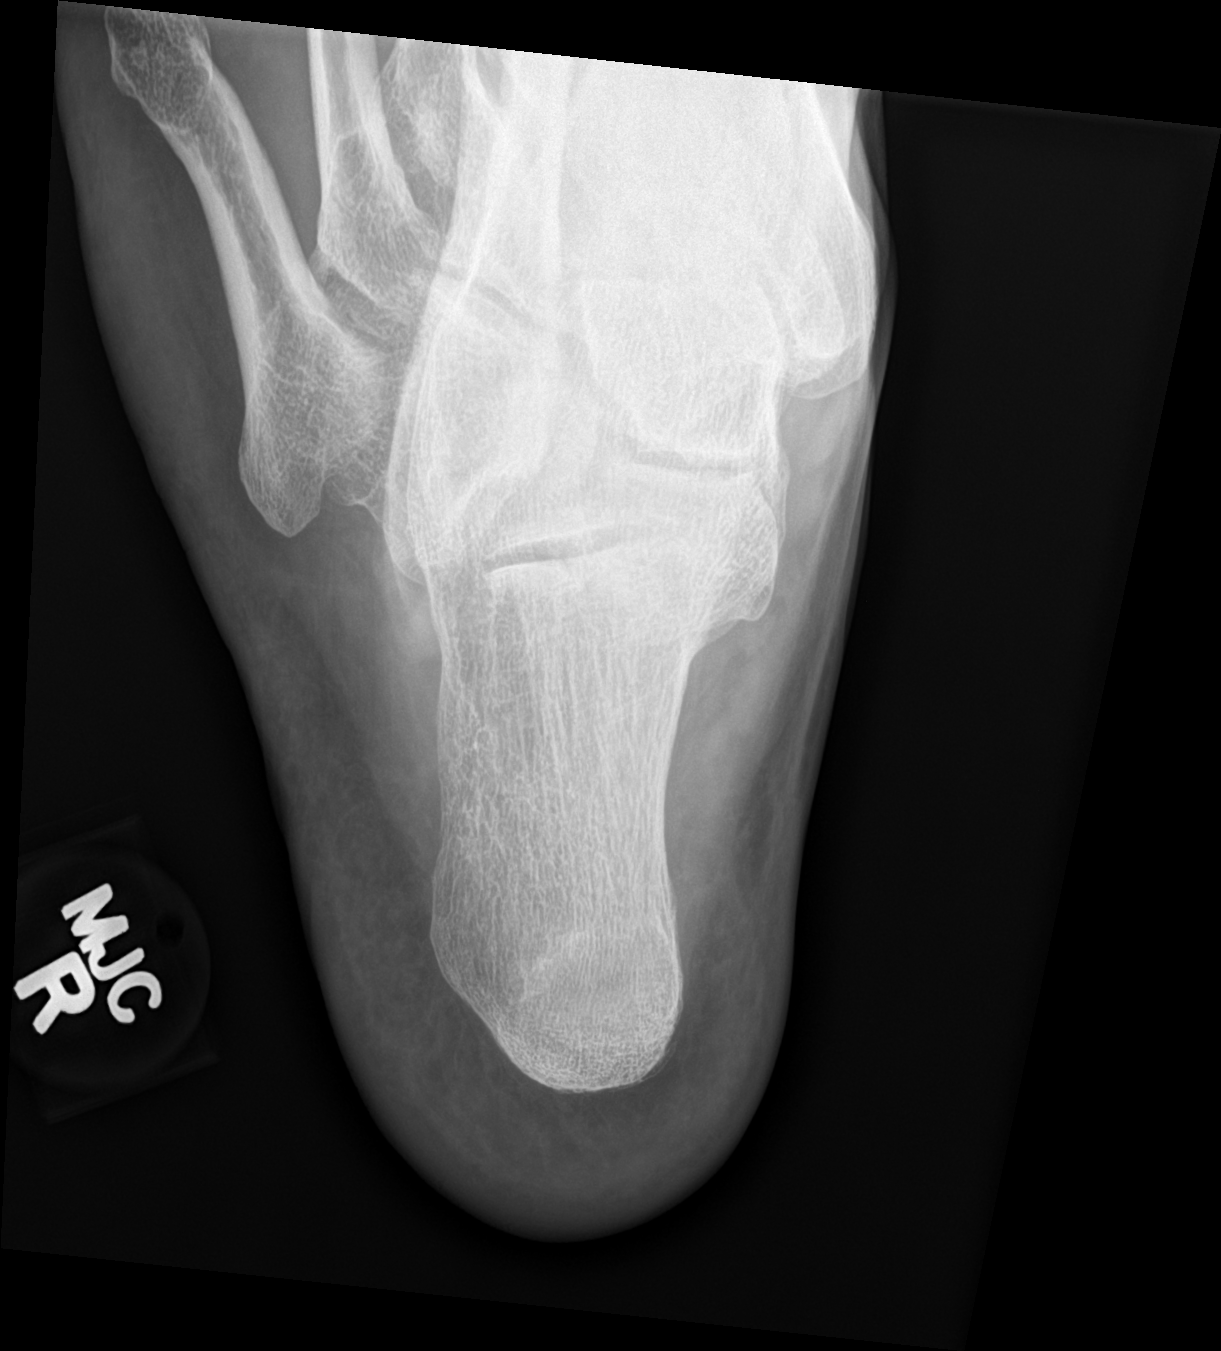

[calcaneus lat]
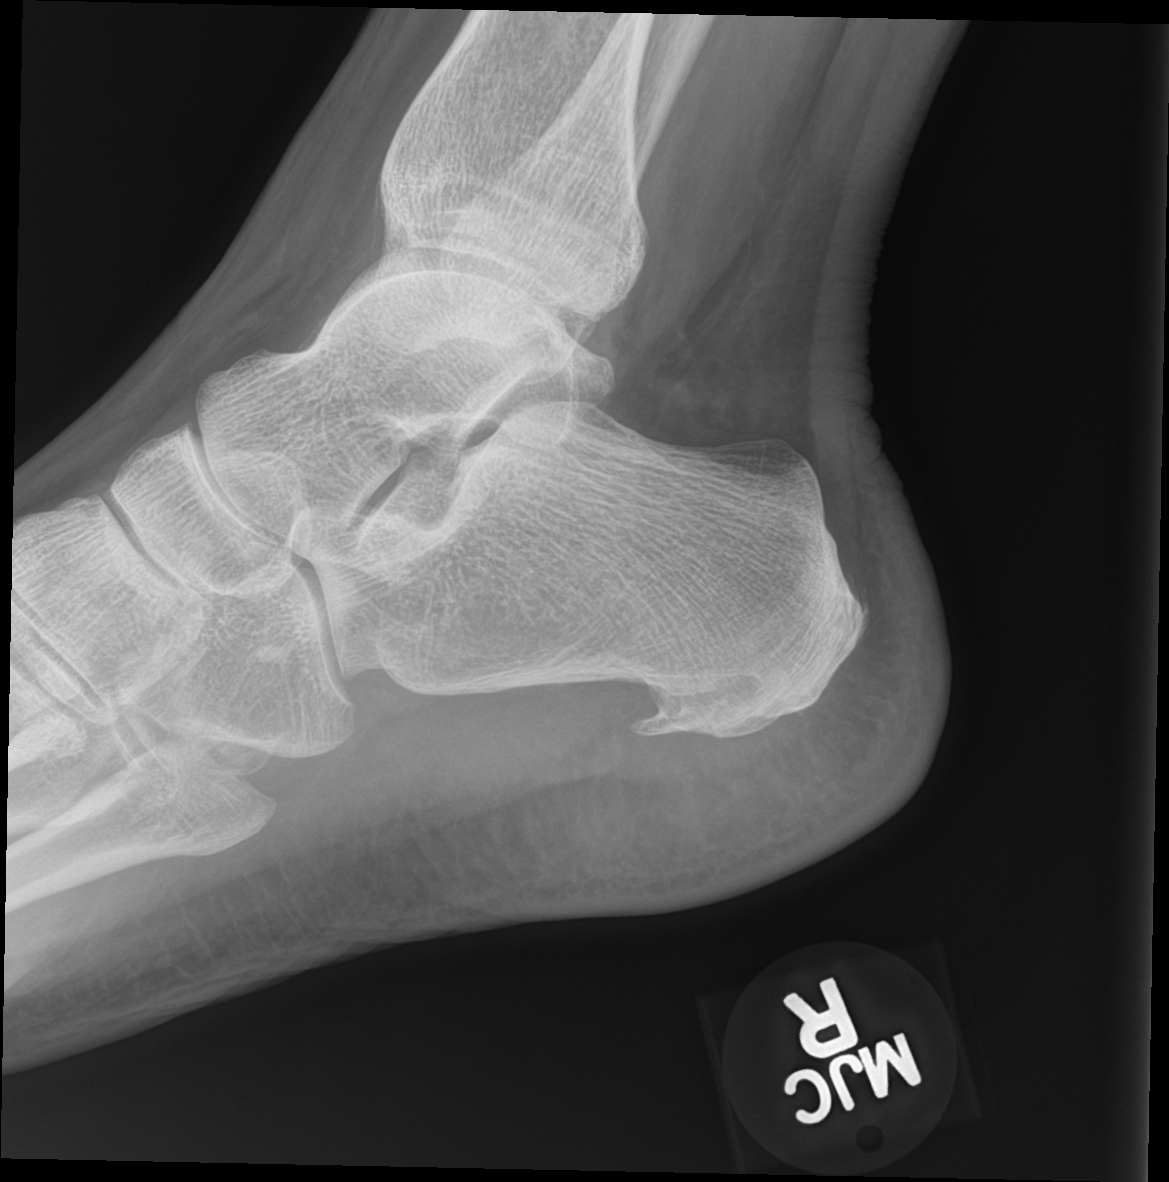

[2 of 2 positions shown; findings below may reference images not displayed]

FINDINGS: Moderate-sized inferior and small posterior calcaneal enthesophytes.
No fracture, dislocation or effusion.
IMPRESSION: Calcaneal enthesophytes, similar to the left-sided radiographs
obtained at the same time.

## 2021-12-08 MED ORDER — PREDNISONE 50 MG PO TABS: 50.0000 mg | ORAL_TABLET | Freq: Every day | ORAL | 0 refills | Status: DC

## 2021-12-08 NOTE — Progress Notes (Signed)
? ?I, Judith Drake, am serving as a Education administrator for Dr. [name]. ? ?Judith Drake is a 51 y.o. female who presents to [place] at [facility] today for f/u of L shoulder pain likely due to adhesive capsulitis and new onset B post heel pain.  She was last seen by Dr. [name] on 06/09/21 and had a L GHJ steroid injection.  She was also referred to PT but never completed any visits.  Today, pt reports that her Left shoulder issue has come back same as last time is worse when lifting her arm. Patient states that this heel pain is new and can barely walk because of the pressure. Patient locates to back of heel started this morning, they are red, puffy and tender to the touch.  ? ?Diagnostic testing: L shoulder XR- 06/09/21 ? ?Pertinent review of systems: No fevers or chills ? ?Relevant historical information: No personal history of rheumatologic disease. ? ? ?Exam:  ?BP 124/82   Pulse (!) 103   Ht '5\' 3"'$  (1.6 m)   Wt 148 lb (67.1 kg)   LMP  (LMP Unknown)   SpO2 98%   BMI 26.22 kg/m?  ?General: Well Developed, well nourished, and in no acute distress.  ? ?MSK: Left shoulder: Normal-appearing decreased range of motion. ?Right calcaneus erythematous and tender palpation at the posterior aspect of the calcaneus.  Decreased ankle range of motion with pain located at the posterior calcaneus.  Achilles tendon is intact with intact strength to resisted foot plantarflexion. ? ?Left calcaneus erythematous and tender palpation at the posterior aspect the calcaneus.  Decreased range of motion with pain located at posterior calcaneus.  Chilis tendon is intact with intact strength resisted foot plantarflexion. ? ? ? ?Lab and Radiology Results ?X-ray images bilateral calcaneus obtained today personally and independently interpreted. ? ?Right calcaneus:  No acute fracture.  Plantar heel spur is present.  Mild soft tissue swelling present. ? ?Left calcaneus: No acute fracture.  Small plantar heel spur is  present.  Soft tissue swelling is present. ? ?Await formal radiology review ? ?Assessment and Plan: ?51 y.o. female with  ?Recurrent or exacerbation of left shoulder pain thought to be due to adhesive capsulitis in the setting of acute bilateral posterior calcaneus pain with unclear explanation. ? ?The shoulder pain I do believe is a recurrent episode of her adhesive capsulitis and in isolation I think should be treated with steroid injection and physical therapy.  However in the setting of her heel pain we will treat with systemic oral steroids and if needed follow-up with a steroid injection. ? ?The cause of her bilateral posterior calcaneus pain is not clear to me at this time.  It looks to be almost a pressure injury.  She does not have a good explanation for being unconscious and having excessive pressure on her heels.  I asked her repeatedly about this and she denied at.  Privately she does have a history of alcohol abuse in her chart but she denies having an episode where she could have had a prolonged period of pressure on the posterior aspect of her heels so I think this is less likely. ? ?I think at this time the most likely explanation is a rheumatologic etiology.  We will proceed with a rheumatologic work-up (labs listed below) and x-rays as above and treat temporarily with oral steroids.  Plan to recheck in a week.  If not better consider ultrasound and injection for potential retrocalcaneal bursitis if the pain  is still significantly present.  I also recommended using a cam walker boot on the worst side if needed. ? ? ?PDMP not reviewed this encounter. ?Orders Placed This Encounter  ?Procedures  ? DG Os Calcis Left  ?  Standing Status:   Future  ?  Number of Occurrences:   1  ?  Standing Expiration Date:   12/09/2022  ?  Order Specific Question:   Reason for Exam (SYMPTOM  OR DIAGNOSIS REQUIRED)  ?  Answer:   eval hele pain  ?  Order Specific Question:   Is patient pregnant?  ?  Answer:   No  ?  Order  Specific Question:   Preferred imaging location?  ?  Answer:   [facility]  ? DG Os Calcis Right  ?  Standing Status:   Future  ?  Number of Occurrences:   1  ?  Standing Expiration Date:   12/09/2022  ?  Order Specific Question:   Reason for Exam (SYMPTOM  OR DIAGNOSIS REQUIRED)  ?  Answer:   eval heel pain  ?  Order Specific Question:   Is patient pregnant?  ?  Answer:   No  ?  Order Specific Question:   Preferred imaging location?  ?  Answer:   [facility]  ? CBC with Differential/Platelet  ?  Standing Status:   Future  ?  Number of Occurrences:   1  ?  Standing Expiration Date:   06/09/2022  ? Sedimentation rate  ?  Standing Status:   Future  ?  Number of Occurrences:   1  ?  Standing Expiration Date:   12/09/2022  ? Rheumatoid factor  ?  Standing Status:   Future  ?  Number of Occurrences:   1  ?  Standing Expiration Date:   12/09/2022  ? ANA  ?  Standing Status:   Future  ?  Number of Occurrences:   1  ?  Standing Expiration Date:   12/09/2022  ? Cyclic citrul peptide antibody, IgG  ?  Standing Status:   Future  ?  Number of Occurrences:   1  ?  Standing Expiration Date:   12/09/2022  ? ?Meds ordered this encounter  ?Medications  ? predniSONE (DELTASONE) 50 MG tablet  ?  Sig: Take 1 tablet (50 mg total) by mouth daily.  ?  Dispense:  5 tablet  ?  Refill:  0  ? ? ? ?Discussed warning signs or symptoms. Please see discharge instructions. Patient expresses understanding. ? ? ?The above documentation has been reviewed and is accurate and complete [name], M.D. ? ? ?

## 2021-12-08 NOTE — Patient Instructions (Addendum)
Good to see you  ?Labs on the way out ?X ray on the way out  ?Prednisone sent to pharmacy  ?Follow up in a week  ?

## 2021-12-09 NOTE — Progress Notes (Signed)
Sedimentation rate a marker for general inflammation is elevated.   ?Your white blood cell count has returned to normal. ?Other labs are still pending.

## 2021-12-10 LAB — CYCLIC CITRUL PEPTIDE ANTIBODY, IGG: Cyclic Citrullin Peptide Ab: <16 UNITS

## 2021-12-10 LAB — ANA: Anti Nuclear Antibody (ANA): NEGATIVE

## 2021-12-10 LAB — RHEUMATOID FACTOR: Rheumatoid fact SerPl-aCnc: <14 IU/mL (ref <14)

## 2021-12-10 NOTE — Progress Notes (Signed)
Further rheumatology labs are negative.

## 2021-12-10 NOTE — Progress Notes (Signed)
Left heel x-ray looks stable and not much change compared to an ankle x-ray from 2016.  No fractures are visible.

## 2021-12-10 NOTE — Progress Notes (Signed)
Right heel x-ray shows some bone spurs.  No fractures are visible.

## 2021-12-16 ENCOUNTER — Ambulatory Visit (INDEPENDENT_AMBULATORY_CARE_PROVIDER_SITE_OTHER): Payer: BC Managed Care – PPO | Admitting: Family Medicine

## 2021-12-16 VITALS — BP 144/96 | HR 98 | Ht 63.0 in | Wt 150.8 lb

## 2021-12-16 DIAGNOSIS — M79672 Pain in left foot: Secondary | ICD-10-CM | POA: Diagnosis not present

## 2021-12-16 DIAGNOSIS — M79671 Pain in right foot: Secondary | ICD-10-CM

## 2021-12-16 NOTE — Patient Instructions (Addendum)
Thank you for coming in today.  ? ?Recheck back as needed ?

## 2021-12-16 NOTE — Progress Notes (Signed)
? ?I, [name], LAT, ATC acting as a scribe for [name], MD. ? ?Judith Drake is a 51 y.o. female who presents to [place] at [facility] today for f/u L shoulder and bilat heel pain. Pt was last seen by Dr. [name] on 12/08/21 and a rheumatologic work-up was gathered and she was prescribed prednisone. Today, pt reports L shoulder nor bilat feet are not bothering her or painful today. Pt thinks the prednisone really helped and she noticed the benefit after a few days. ? ?Dx testing: 12/08/21 R & L Os calcis XR ? 12/08/21 Labs ?06/09/21 L shoulder XR ? ?Pertinent review of systems: No fevers or chills ? ?Relevant historical information: Hypertension ? ? ?Exam:  ?BP (!) 144/96   Pulse 98   Ht '5\' 3"'$  (1.6 m)   Wt 150 lb 12.8 oz (68.4 kg)   LMP  (LMP Unknown)   SpO2 98%   BMI 26.71 kg/m?  ?General: Well Developed, well nourished, and in no acute distress.  ? ?MSK: Heels bilaterally normal-appearing ?Nontender. ?Normal foot and ankle motion. ?Left shoulder normal-appearing normal motion. ? ? ? ?Lab and Radiology Results ?Recent Results (from the past 2160 hour(s))  ?Resp Panel by RT-PCR (Flu A&B, Covid)     Status: None  ? Collection Time: 12/01/21  2:24 PM  ?Result Value Ref Range  ? SARS Coronavirus 2 by RT PCR NEGATIVE NEGATIVE  ?  Comment: (NOTE) ?SARS-CoV-2 target nucleic acids are NOT DETECTED. ? ?The SARS-CoV-2 RNA is generally detectable in upper respiratory ?specimens during the acute phase of infection. The lowest ?concentration of SARS-CoV-2 viral copies this assay can detect is ?138 copies/mL. A negative result does not preclude SARS-Cov-2 ?infection and should not be used as the sole basis for treatment or ?other patient management decisions. A negative result may occur with  ?improper specimen collection/handling, submission of specimen other ?than nasopharyngeal swab, presence of viral mutation(s) within the ?areas targeted by this assay, and inadequate number of  viral ?copies(<138 copies/mL). A negative result must be combined with ?clinical observations, patient history, and epidemiological ?information. The expected result is Negative. ? ?Fact Sheet for Patients:  ?EntrepreneurPulse.com.au ? ?Fact Sheet for Healthcare Providers:  ?IncredibleEmployment.be ? ?This test is no t yet approved or cleared by the [place] FDA and  ?has been authorized for detection and/or diagnosis of SARS-CoV-2 by ?FDA under an Emergency Use Authorization (EUA). This EUA will remain  ?in effect (meaning this test can be used) for the duration of the ?COVID-19 declaration under Section 564(b)(1) of the Act, 21 ?U.S.C.section 360bbb-3(b)(1), unless the authorization is terminated  ?or revoked sooner.  ? ? ?  ? Influenza A by PCR NEGATIVE NEGATIVE  ? Influenza B by PCR NEGATIVE NEGATIVE  ?  Comment: (NOTE) ?The Xpert Xpress SARS-CoV-2/FLU/RSV plus assay is intended as an aid ?in the diagnosis of influenza from Nasopharyngeal swab specimens and ?should not be used as a sole basis for treatment. Nasal washings and ?aspirates are unacceptable for Xpert Xpress SARS-CoV-2/FLU/RSV ?testing. ? ?Fact Sheet for Patients: ?EntrepreneurPulse.com.au ? ?Fact Sheet for Healthcare Providers: ?IncredibleEmployment.be ? ?This test is not yet approved or cleared by the [place] FDA and ?has been authorized for detection and/or diagnosis of SARS-CoV-2 by ?FDA under an Emergency Use Authorization (EUA). This EUA will remain ?in effect (meaning this test can be used) for the duration of the ?COVID-19 declaration under Section 564(b)(1) of the Act, 21 U.S.C. ?section 360bbb-3(b)(1), unless the authorization is terminated or ?revoked. ? ?  Performed at [facility], [address] [place], [place] [address] ?  ?Group A Strep by PCR     Status: None  ? Collection Time: 12/01/21  3:50 PM  ? Specimen: Throat; Sterile Swab  ?Result  Value Ref Range  ? Group A Strep by PCR NOT DETECTED NOT DETECTED  ?  Comment: Performed at [facility], [address], [place], [place] [address]  ?CBC with Differential     Status: Abnormal  ? Collection Time: 12/01/21  4:02 PM  ?Result Value Ref Range  ? WBC 16.6 (H) 4.0 - 10.5 K/uL  ? RBC 4.47 3.87 - 5.11 MIL/uL  ? Hemoglobin 13.9 12.0 - 15.0 g/dL  ? HCT 39.7 36.0 - 46.0 %  ? MCV 88.8 80.0 - 100.0 fL  ? MCH 31.1 26.0 - 34.0 pg  ? MCHC 35.0 30.0 - 36.0 g/dL  ? RDW 13.8 11.5 - 15.5 %  ? Platelets 270 150 - 400 K/uL  ? nRBC 0.0 0.0 - 0.2 %  ? Neutrophils Relative % 86 %  ? Neutro Abs 14.2 (H) 1.7 - 7.7 K/uL  ? Lymphocytes Relative 6 %  ? Lymphs Abs 1.0 0.7 - 4.0 K/uL  ? Monocytes Relative 7 %  ? Monocytes Absolute 1.2 (H) 0.1 - 1.0 K/uL  ? Eosinophils Relative 0 %  ? Eosinophils Absolute 0.0 0.0 - 0.5 K/uL  ? Basophils Relative 0 %  ? Basophils Absolute 0.1 0.0 - 0.1 K/uL  ? Immature Granulocytes 1 %  ? Abs Immature Granulocytes 0.12 (H) 0.00 - 0.07 K/uL  ?  Comment: Performed at [facility], [address] [place], [place] [address]  ?Comprehensive metabolic panel     Status: Abnormal  ? Collection Time: 12/01/21  4:02 PM  ?Result Value Ref Range  ? Sodium 135 135 - 145 mmol/L  ? Potassium 3.0 (L) 3.5 - 5.1 mmol/L  ? Chloride 95 (L) 98 - 111 mmol/L  ? CO2 27 22 - 32 mmol/L  ? Glucose, Bld 114 (H) 70 - 99 mg/dL  ?  Comment: Glucose reference range applies only to samples taken after fasting for at least 8 hours.  ? BUN 8 6 - 20 mg/dL  ? Creatinine, Ser 0.61 0.44 - 1.00 mg/dL  ? Calcium 9.3 8.9 - 10.3 mg/dL  ? Total Protein 8.9 (H) 6.5 - 8.1 g/dL  ? Albumin 4.5 3.5 - 5.0 g/dL  ? AST 30 15 - 41 U/L  ? ALT 23 0 - 44 U/L  ? Alkaline Phosphatase 85 38 - 126 U/L  ? Total Bilirubin 0.6 0.3 - 1.2 mg/dL  ? GFR, Estimated >60 >60 mL/min  ?  Comment: (NOTE) ?Calculated using the CKD-EPI Creatinine Equation (2021) ?  ? Anion gap 13 5 - 15  ?  Comment: Performed at [facility], [address], [place], [place] [address]  ?Cyclic citrul peptide antibody, IgG     Status: None  ? Collection Time: 12/08/21  3:40 PM  ?Result Value Ref Range  ? Cyclic Citrullin Peptide Ab <16 UNITS  ?  Comment: Reference Range ?Negative:            <20 ?Weak Positive:       20-39 ?Moderate Positive:   40-59 ?Strong Positive:     >59 ?. ?  ?ANA     Status: None  ? Collection Time: 12/08/21  3:40 PM  ?Result Value Ref Range  ?  Anti Nuclear Antibody (ANA) NEGATIVE NEGATIVE  ?  Comment: ANA IFA is a first line screen for detecting the ?presence of up to approximately 150 autoantibodies in ?various autoimmune diseases. A negative ANA IFA result ?suggests an ANA-associated autoimmune disease is not ?present at this time, but is not definitive. If there ?is high clinical suspicion for Sjogren's syndrome, ?testing for anti-SS-A/Ro antibody should be considered. ?Anti-Jo-1 antibody should be considered for clinically ?suspected inflammatory myopathies. ?. ?AC-0: Negative ?Marland Kitchen ?International Consensus on ANA Patterns ?(https://www.hernandez-brewer.com/) ?Marland Kitchen ?For additional information, please refer to ?http://education.[employer].com/faq/FAQ177 ?(This link is being provided for informational/ ?educational purposes only.) ?. ?  ?Rheumatoid factor     Status: None  ? Collection Time: 12/08/21  3:40 PM  ?Result Value Ref Range  ? Rhuematoid fact SerPl-aCnc <14 <14 IU/mL  ?Sedimentation rate     Status: Abnormal  ? Collection Time: 12/08/21  3:40 PM  ?Result Value Ref Range  ? Sed Rate 73 (H) 0 - 20 mm/hr  ?CBC with Differential/Platelet     Status: Abnormal  ? Collection Time: 12/08/21  3:40 PM  ?Result Value Ref Range  ? WBC 10.2 4.0 - 10.5 K/uL  ? RBC 4.63 3.87 - 5.11 Mil/uL  ? Hemoglobin 14.3 12.0 - 15.0 g/dL  ? HCT 43.1 36.0 - 46.0 %  ? MCV 93.0 78.0 - 100.0 fl  ? MCHC 33.2 30.0 - 36.0 g/dL  ? RDW 14.1 11.5 - 15.5 %  ? Platelets 451.0 (H) 150.0 - 400.0 K/uL  ? Neutrophils Relative % 67.7 43.0 - 77.0 %  ? Lymphocytes  Relative 19.6 12.0 - 46.0 %  ? Monocytes Relative 8.7 3.0 - 12.0 %  ? Eosinophils Relative 3.3 0.0 - 5.0 %  ? Basophils Relative 0.7 0.0 - 3.0 %  ? Neutro Abs 6.9 1.4 - 7.7 K/uL  ? Lymphs Abs 2.0 0.7 - 4.0 K/uL  ? Monocytes Abso

## 2021-12-22 ENCOUNTER — Ambulatory Visit: Payer: BC Managed Care – PPO | Admitting: Family Medicine

## 2021-12-22 ENCOUNTER — Encounter: Payer: BC Managed Care – PPO | Admitting: Family

## 2021-12-22 VITALS — BP 158/102 | HR 106 | Ht 63.0 in | Wt 149.6 lb

## 2021-12-22 DIAGNOSIS — R700 Elevated erythrocyte sedimentation rate: Secondary | ICD-10-CM

## 2021-12-22 DIAGNOSIS — M79672 Pain in left foot: Secondary | ICD-10-CM

## 2021-12-22 DIAGNOSIS — M79671 Pain in right foot: Secondary | ICD-10-CM

## 2021-12-22 DIAGNOSIS — M25571 Pain in right ankle and joints of right foot: Secondary | ICD-10-CM

## 2021-12-22 DIAGNOSIS — M25572 Pain in left ankle and joints of left foot: Secondary | ICD-10-CM

## 2021-12-22 DIAGNOSIS — R7982 Elevated C-reactive protein (CRP): Secondary | ICD-10-CM

## 2021-12-22 DIAGNOSIS — R779 Abnormality of plasma protein, unspecified: Secondary | ICD-10-CM | POA: Diagnosis not present

## 2021-12-22 LAB — COMPREHENSIVE METABOLIC PANEL
ALT: 23 U/L (ref 0–35)
AST: 26 U/L (ref 0–37)
Albumin: 4.8 g/dL (ref 3.5–5.2)
Alkaline Phosphatase: 86 U/L (ref 39–117)
BUN: 6 mg/dL (ref 6–23)
CO2: 30 mEq/L (ref 19–32)
Calcium: 10.1 mg/dL (ref 8.4–10.5)
Chloride: 96 mEq/L (ref 96–112)
Creatinine, Ser: 0.69 mg/dL (ref 0.40–1.20)
GFR: 101.76 mL/min (ref >60.00)
Glucose, Bld: 99 mg/dL (ref 70–99)
Potassium: 3.3 mEq/L — ABNORMAL LOW (ref 3.5–5.1)
Sodium: 138 mEq/L (ref 135–145)
Total Bilirubin: 1.3 mg/dL — ABNORMAL HIGH (ref 0.2–1.2)
Total Protein: 8.3 g/dL (ref 6.0–8.3)

## 2021-12-22 LAB — URIC ACID: Uric Acid, Serum: 4.6 mg/dL (ref 2.4–7.0)

## 2021-12-22 LAB — TSH: TSH: 1.54 u[IU]/mL (ref 0.35–5.50)

## 2021-12-22 LAB — C-REACTIVE PROTEIN: CRP: 3 mg/dL (ref 0.5–20.0)

## 2021-12-22 LAB — CK: Total CK: 43 U/L (ref 7–177)

## 2021-12-22 LAB — SEDIMENTATION RATE: Sed Rate: 57 mm/hr — ABNORMAL HIGH (ref 0–20)

## 2021-12-22 MED ORDER — PREDNISONE 10 MG (48) PO TBPK: ORAL_TABLET | Freq: Every day | ORAL | 0 refills | Status: DC

## 2021-12-22 NOTE — Progress Notes (Signed)
? ?I, [name], LAT, ATC acting as a scribe for [name], MD. ? ?Judith Drake is a 51 y.o. female who presents to [place] at [facility] today for f/u bilat posterior heel pain. Pt was last seen by Dr. [name] on 12/16/21 and was advised to plan for watchful waiting, and if this reoccurs, potential for additional lab testing. Today, pt reports relief from the prednisone. Pt notes that bilat heel pain came back gradually, starting on 4/13, and worsening since. Pt states that the pain in her feet is worse than at her last visit. Pt c/o pain in bilat feet even at rest. Pt locates pain to the whole calcaneous, medially and laterally. Pt c/o heels feeling warm to touch, swelling, red, very TTP, and pain feeling like its on "fire." ? ?Dx testing: 12/08/21 R & L Os calcis XR ?            12/08/21 Labs ? ?Pertinent review of systems: No fevers or chills ? ?Relevant historical information: Hypertension ? ? ?Exam:  ?BP (!) 158/102   Pulse (!) 106   Ht '5\' 3"'  (1.6 m)   Wt 149 lb 9.6 oz (67.9 kg)   LMP  (LMP Unknown)   SpO2 99%   BMI 26.50 kg/m?  ?General: Well Developed, well nourished, and in no acute distress.  ? ?MSK: Bilateral calcaneus erythematous and tender.  Pisa genic papules erythematous appearing on bilateral calcaneus.  Decreased ankle motion with pain. ? ? ? ?Lab and Radiology Results ? ?EXAM: ?RIGHT OS CALCIS - 2+ VIEW ?  ?COMPARISON:  Left os calcis obtained at the same time. ?  ?FINDINGS: ?Moderate-sized inferior and small posterior calcaneal enthesophytes. ?No fracture, dislocation or effusion. ?  ?IMPRESSION: ?Calcaneal enthesophytes, similar to the left-sided radiographs ?obtained at the same time. ?  ?  ?Electronically Signed ?  By: Claudie Revering M.D. ?  On: 12/09/2021 14:36 ? ?  ?EXAM: ?LEFT OS CALCIS - 2+ VIEW ?  ?COMPARISON:  Left ankle dated 03/21/2015 ?  ?FINDINGS: ?Stable moderate-sized inferior and small posterior calcaneal ?enthesophytes. No fracture, dislocation or  ankle effusion. ?  ?IMPRESSION: ?No acute abnormality.  Stable calcaneal enthesophytes. ?  ?  ?Electronically Signed ?  By: Claudie Revering M.D. ?  On: 12/09/2021 14:35 ?  ? ? ?I, [name], personally (independently) visualized and performed the interpretation of the images attached in this note. ? ? ?Component ?    Latest Ref Rng 12/08/2021  ?WBC ?    4.0 - 10.5 K/uL 10.2   ?RBC ?    3.87 - 5.11 Mil/uL 4.63   ?Hemoglobin ?    12.0 - 15.0 g/dL 14.3   ?HCT ?    36.0 - 46.0 % 43.1   ?MCV ?    78.0 - 100.0 fl 93.0   ?MCHC ?    30.0 - 36.0 g/dL 33.2   ?RDW ?    11.5 - 15.5 % 14.1   ?Platelets ?    150.0 - 400.0 K/uL 451.0 (H)   ?Neutrophils ?    43.0 - 77.0 % 67.7   ?Lymphocytes ?    12.0 - 46.0 % 19.6   ?Monocytes Relative ?    3.0 - 12.0 % 8.7   ?Eosinophil ?    0.0 - 5.0 % 3.3   ?Basophil ?    0.0 - 3.0 % 0.7   ?NEUT# ?    1.4 - 7.7 K/uL 6.9   ?Lymphocyte # ?  0.7 - 4.0 K/uL 2.0   ?Monocyte # ?    0.1 - 1.0 K/uL 0.9   ?Eosinophils Absolute ?    0.0 - 0.7 K/uL 0.3   ?Basophils Absolute ?    0.0 - 0.1 K/uL 0.1   ?Cyclic Citrullin Peptide Ab ?    UNITS <16   ?Anti Nuclear Antibody (ANA) ?    NEGATIVE  NEGATIVE   ?RA Latex Turbid. ?    <14 IU/mL <14   ?Sed Rate ?    0 - 20 mm/hr 73 (H)   ?  ?(H) High ? ?Assessment and Plan: ?51 y.o. female with bilateral calcaneus pain and inflammation.  This is a recurrence of an issue that was seen about 2 weeks ago that immediately resolved with prednisone and then recurred once the prednisone wore off.  The etiology is unclear at this time however she had significantly elevated sed rate with limited rheumatologic work-up listed above. ? ?Plan today to proceed with extended rheumatologic work-up listed below including repeat sed rate, CRP CK HLA-B27.  We will also check TSH CMP uric acid and serum electrophoresis with reflex. ?If labs are significantly positive likely will be referring to rheumatology.  I think she has some inflammatory issue that currently is not well understood and  hopefully rheumatology will be able to better treat her.  Steroids seem to work but are not a good long-term solution. ? ?Treat now with prednisone Dosepak for 12 days.  Hopefully this will last longer than the 5-day burst that I gave her 2 weeks ago. ? ? ?PDMP not reviewed this encounter. ?Orders Placed This Encounter  ?Procedures  ? Sedimentation rate  ?  Standing Status:   Future  ?  Standing Expiration Date:   12/23/2022  ? C-reactive protein  ?  Standing Status:   Future  ?  Standing Expiration Date:   12/23/2022  ? CK  ?  Standing Status:   Future  ?  Standing Expiration Date:   12/23/2022  ? Complement, total  ?  Standing Status:   Future  ?  Standing Expiration Date:   12/23/2022  ? HLA-B27 antigen  ?  Standing Status:   Future  ?  Standing Expiration Date:   12/23/2022  ? TSH  ?  Standing Status:   Future  ?  Standing Expiration Date:   12/23/2022  ? Serum protein electrophoresis with reflex  ?  Standing Status:   Future  ?  Standing Expiration Date:   12/23/2022  ? Comprehensive metabolic panel  ?  Standing Status:   Future  ?  Standing Expiration Date:   12/23/2022  ? Uric acid  ?  Standing Status:   Future  ?  Standing Expiration Date:   12/23/2022  ? ?Meds ordered this encounter  ?Medications  ? DISCONTD: predniSONE (STERAPRED UNI-PAK 48 TAB) 10 MG (48) TBPK tablet  ?  Sig: Take by mouth daily. 12 day dosepack po  ?  Dispense:  48 tablet  ?  Refill:  0  ? predniSONE (STERAPRED UNI-PAK 48 TAB) 10 MG (48) TBPK tablet  ?  Sig: Take by mouth daily. 12 day dosepack po  ?  Dispense:  48 tablet  ?  Refill:  0  ? ? ? ?Discussed warning signs or symptoms. Please see discharge instructions. Patient expresses understanding. ? ? ?The above documentation has been reviewed and is accurate and complete [name], M.D. ? ? ?

## 2021-12-22 NOTE — Patient Instructions (Addendum)
Thank you for coming in today.  ? ?Please get labs today before you leave  ? ?I've sent a prescription for prednisone to your pharmacy.  ? ?We will decide on a treatment plan based on the results of the labs ?

## 2021-12-23 NOTE — Progress Notes (Signed)
Some labs are back.  Sedimentation rate is still elevated.  Other labs are mostly normal.  Other labs are still pending.

## 2021-12-25 LAB — PROTEIN ELECTROPHORESIS, SERUM, WITH REFLEX
Albumin ELP: 4.7 g/dL (ref 3.8–4.8)
Alpha 1: 0.4 g/dL — ABNORMAL HIGH (ref 0.2–0.3)
Alpha 2: 0.9 g/dL (ref 0.5–0.9)
Beta 2: 0.4 g/dL (ref 0.2–0.5)
Beta Globulin: 0.6 g/dL (ref 0.4–0.6)
Gamma Globulin: 1 g/dL (ref 0.8–1.7)
Total Protein: 8 g/dL (ref 6.1–8.1)

## 2021-12-25 LAB — HLA-B27 ANTIGEN: HLA-B27 Antigen: NEGATIVE

## 2021-12-25 LAB — COMPLEMENT, TOTAL: Compl, Total (CH50): >60 U/mL — ABNORMAL HIGH (ref 31–60)

## 2021-12-25 NOTE — Progress Notes (Signed)
Some of the labs are abnormal.  Complement level is elevated.  Other labs are negative.  I think at this point it makes sense to get advice from her rheumatologist.  I am going to refer to [facility] rheumatology. ?You should hear soon from them.

## 2021-12-25 NOTE — Addendum Note (Signed)
Addended by: Gregor Hams on: 12/25/2021 02:57 PM ? ? Modules accepted: Orders ? ?

## 2022-01-06 DIAGNOSIS — M25511 Pain in right shoulder: Secondary | ICD-10-CM | POA: Diagnosis not present

## 2022-01-06 DIAGNOSIS — M1990 Unspecified osteoarthritis, unspecified site: Secondary | ICD-10-CM | POA: Diagnosis not present

## 2022-01-06 DIAGNOSIS — M79671 Pain in right foot: Secondary | ICD-10-CM | POA: Diagnosis not present

## 2022-01-06 DIAGNOSIS — M549 Dorsalgia, unspecified: Secondary | ICD-10-CM | POA: Diagnosis not present

## 2022-01-06 DIAGNOSIS — M47819 Spondylosis without myelopathy or radiculopathy, site unspecified: Secondary | ICD-10-CM | POA: Diagnosis not present

## 2022-01-08 DIAGNOSIS — M1990 Unspecified osteoarthritis, unspecified site: Secondary | ICD-10-CM | POA: Diagnosis not present

## 2022-01-08 DIAGNOSIS — M7989 Other specified soft tissue disorders: Secondary | ICD-10-CM | POA: Diagnosis not present

## 2022-01-08 DIAGNOSIS — M47819 Spondylosis without myelopathy or radiculopathy, site unspecified: Secondary | ICD-10-CM | POA: Diagnosis not present

## 2022-01-08 DIAGNOSIS — M79671 Pain in right foot: Secondary | ICD-10-CM | POA: Diagnosis not present

## 2022-01-14 ENCOUNTER — Ambulatory Visit (INDEPENDENT_AMBULATORY_CARE_PROVIDER_SITE_OTHER): Payer: BC Managed Care – PPO | Admitting: Family

## 2022-01-14 ENCOUNTER — Encounter: Payer: Self-pay | Admitting: Family

## 2022-01-14 VITALS — BP 146/90 | HR 98 | Temp 98.2°F | Ht 63.0 in | Wt 152.6 lb

## 2022-01-14 DIAGNOSIS — Z136 Encounter for screening for cardiovascular disorders: Secondary | ICD-10-CM | POA: Diagnosis not present

## 2022-01-14 DIAGNOSIS — E611 Iron deficiency: Secondary | ICD-10-CM

## 2022-01-14 DIAGNOSIS — I10 Essential (primary) hypertension: Secondary | ICD-10-CM

## 2022-01-14 DIAGNOSIS — Z0000 Encounter for general adult medical examination without abnormal findings: Secondary | ICD-10-CM

## 2022-01-14 DIAGNOSIS — F32A Depression, unspecified: Secondary | ICD-10-CM

## 2022-01-14 DIAGNOSIS — N3281 Overactive bladder: Secondary | ICD-10-CM

## 2022-01-14 DIAGNOSIS — F419 Anxiety disorder, unspecified: Secondary | ICD-10-CM

## 2022-01-14 MED ORDER — TROSPIUM CHLORIDE 20 MG PO TABS: 20.0000 mg | ORAL_TABLET | Freq: Two times a day (BID) | ORAL | 1 refills | Status: DC

## 2022-01-14 MED ORDER — ESCITALOPRAM OXALATE 10 MG PO TABS: 10.0000 mg | ORAL_TABLET | Freq: Every day | ORAL | 1 refills | Status: DC

## 2022-01-14 MED ORDER — LOSARTAN POTASSIUM 25 MG PO TABS: 25.0000 mg | ORAL_TABLET | Freq: Every day | ORAL | 1 refills | Status: DC

## 2022-01-14 NOTE — Patient Instructions (Addendum)
Start trospium 20 mg twice daily for overactive bladder ? ?Start Lexapro 10 mg for anxiety and depression ? ?Continue amlodipine 10 mg.  Please start losartan 25 mg for additional blood pressure control.  Please bring your blood pressure cuff to your next appointment ? ?It is imperative that you are seen AT least twice per year for labs and monitoring. Monitor blood pressure at home and me 5-6 reading on separate days. Goal is less than 120/80, based on newest guidelines, however we certainly want to be less than 130/80;  if persistently higher, please make sooner follow up appointment so we can recheck you blood pressure and manage/ adjust medications. ? ?Our hope is for gradual improvement of mood since starting medication; however this may take several weeks.  ? ?If you start to have unusual thoughts, thoughts of hurting yourself, or anyone else, please go immediately to the emergency department.  ? ?Please text to [phone] and write the word 'home'. This will put you in touch with trained crisis counselor and resources.  ? ? ?National Suicide Prevention Hotline - available 24 hours a day, 7 days a week.  ?9542934251 ? ?Major Depressive Disorder ?Major depressive disorder is a mental illness. It also may be called clinical depression or unipolar depression. Major depressive disorder usually causes feelings of sadness, hopelessness, or helplessness. Some people with this disorder do not feel particularly sad but lose interest in doing things they used to enjoy (anhedonia). Major depressive disorder also can cause physical symptoms. It can interfere with work, school, relationships, and other normal everyday activities. The disorder varies in severity but is longer lasting and more serious than the sadness we all feel from time to time in our lives. ?Major depressive disorder often is triggered by stressful life events or major life changes. Examples of these triggers include divorce, loss of your job or home, a  move, and the death of a family member or close friend. Sometimes this disorder occurs for no obvious reason at all. People who have family members with major depressive disorder or bipolar disorder are at higher risk for developing this disorder, with or without life stressors. Major depressive disorder can occur at any age. It may occur just once in your life (single episode major depressive disorder). It may occur multiple times (recurrent major depressive disorder). ?SYMPTOMS ?People with major depressive disorder have either anhedonia or depressed mood on nearly a daily basis for at least 2 weeks or longer. Symptoms of depressed mood include: ?Feelings of sadness (blue or down in the dumps) or emptiness. ?Feelings of hopelessness or helplessness. ?Tearfulness or episodes of crying (may be observed by others). ?Irritability (children and adolescents). ?In addition to depressed mood or anhedonia or both, people with this disorder have at least four of the following symptoms: ?Difficulty sleeping or sleeping too much.   ?Significant change (increase or decrease) in appetite or weight.   ?Lack of energy or motivation. ?Feelings of guilt and worthlessness.   ?Difficulty concentrating, remembering, or making decisions. ?Unusually slow movement (psychomotor retardation) or restlessness (as observed by others).   ?Recurrent wishes for death, recurrent thoughts of self-harm (suicide), or a suicide attempt. ?People with major depressive disorder commonly have persistent negative thoughts about themselves, other people, and the world. People with severe major depressive disorder may experience distorted beliefs or perceptions about the world (psychotic delusions). They also may see or hear things that are not real (psychotic hallucinations). ?DIAGNOSIS ?Major depressive disorder is diagnosed through an assessment by your  health care provider. Your health care provider will ask about aspects of your daily life, such as  mood, sleep, and appetite, to see if you have the diagnostic symptoms of major depressive disorder. Your health care provider may ask about your medical history and use of alcohol or drugs, including prescription medicines. Your health care provider also may do a physical exam and blood work. This is because certain medical conditions and the use of certain substances can cause major depressive disorder-like symptoms (secondary depression). Your health care provider also may refer you to a mental health specialist for further evaluation and treatment. ?TREATMENT ?It is important to recognize the symptoms of major depressive disorder and seek treatment. The following treatments can be prescribed for this disorder:   ?Medicine. Antidepressant medicines usually are prescribed. Antidepressant medicines are thought to correct chemical imbalances in the brain that are commonly associated with major depressive disorder. Other types of medicine may be added if the symptoms do not respond to antidepressant medicines alone or if psychotic delusions or hallucinations occur. ?Talk therapy. Talk therapy can be helpful in treating major depressive disorder by providing support, education, and guidance. Certain types of talk therapy also can help with negative thinking (cognitive behavioral therapy) and with relationship issues that trigger this disorder (interpersonal therapy). ?A mental health specialist can help determine which treatment is best for you. Most people with major depressive disorder do well with a combination of medicine and talk therapy. Treatments involving electrical stimulation of the brain can be used in situations with extremely severe symptoms or when medicine and talk therapy do not work over time. These treatments include electroconvulsive therapy, transcranial magnetic stimulation, and vagal nerve stimulation. ?  ?This information is not intended to replace advice given to you by your health care  provider. Make sure you discuss any questions you have with your health care provider. ?  ?Document Released: 12/19/2012 Document Revised: 09/14/2014 Document Reviewed: 12/19/2012 ?Elsevier Interactive Patient Education ?2016 [employer]. ? ? ? ?

## 2022-01-14 NOTE — Progress Notes (Signed)
? ?Subjective:  ? ? Patient ID: Judith Drake, female    DOB: 12/22/1970, 51 y.o.   MRN: [id number] ? ?CC: Judith Drake is a 51 y.o. female who presents today for follow up.  ? ?HPI: Complains of insomnia, trouble staying and falling asleep.  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  She feels overwhelmed. ?She never started zoloft.  She previously been on Lexapro years ago. ? ?She denies any thoughts of hurting herself or anyone else.  She does not have a suicide plan ? ?She complains of frequency urination, urinary urgency, incontinence with sneezing for months. Wearing panty liner.  ? h/o hysterectomy. Doesn't feel protrusion for vagina.  ?No fever, dysuria.  ? ? ?HTN- compliant with amlodipine '10mg'$  . No nsaids. No cp.  ? ?Following with Dr [name] for left shoulder pain, heel pain.  Dr. [name] referred her to rheumatology.Sed rate 57, Crp 3.  Dr. [name] has prescribed her a prednisone taper ?Consult with Dr Dossie Der  however unable to see note. ? ?HISTORY:  ?Past Medical History:  ?Diagnosis Date  ? Abnormal uterine bleeding (AUB) 03/20/2019  ? Acute lower UTI 05/12/2019  ? Acute respiratory failure with hypoxia ([place]) 04/10/2018  ? Anemia   ? Arthritis   ? lower back and hips  ? Dysmenorrhea 03/20/2019  ? Elevated liver enzymes 11/24/2013  ? ETOH abuse   ? Fatty liver 10/03/2019  ? Fibroid   ? GERD (gastroesophageal reflux disease)   ? Heavy menstrual period   ? Hypertension   ? Menorrhagia 12/13/2017  ? SIRS (systemic inflammatory response syndrome) ([place]) 05/12/2019  ? ?Past Surgical History:  ?Procedure Laterality Date  ? CYSTOSCOPY N/A 12/19/2019  ? Procedure: CYSTOSCOPY;  Surgeon: Aletha Halim, MD;  Location: [facility];  Service: Gynecology;  Laterality: N/A;  ? ELBOW SURGERY    ? 1997   ? TOTAL LAPAROSCOPIC HYSTERECTOMY WITH SALPINGECTOMY Bilateral 12/19/2019  ? Procedure: TOTAL LAPAROSCOPIC HYSTERECTOMY WITH BILATERAL SALPINGECTOMY;  Surgeon: Aletha Halim, MD;  Location: [facility];  Service: Gynecology;   Laterality: Bilateral;  ? TUBAL LIGATION    ? ?Family History  ?Problem Relation Age of Onset  ? Cancer Mother 76  ?     Lung   ? Hyperlipidemia Mother   ? Hypertension Mother   ? Stroke Mother   ? Kidney disease Mother   ? Diabetes Mother   ? Heart disease Mother   ? Depression Mother   ? Hyperlipidemia Father   ? Cancer Paternal Grandfather 84  ?     Colon Cancer - died  ? Autism Son   ? Breast cancer Neg Hx   ? ? ?Allergies: Amoxicillin, Dilaudid [hydromorphone hcl], and Morphine and related ?Current Outpatient Medications on File Prior to Visit  ?Medication Sig Dispense Refill  ? amLODipine (NORVASC) 10 MG tablet TAKE 1 TABLET BY MOUTH ONCE DAILY 30 tablet 5  ? Aspirin-Caffeine 845-65 MG PACK Take 1 Package by mouth daily as needed (pain).    ? predniSONE (STERAPRED UNI-PAK 48 TAB) 10 MG (48) TBPK tablet Take by mouth daily. 12 day dosepack po 48 tablet 0  ? ?No current facility-administered medications on file prior to visit.  ? ? ?Social History  ? ?Tobacco Use  ? Smoking status: Every Day  ?  Packs/day: 0.50  ?  Types: Cigarettes  ? Smokeless tobacco: Never  ?Vaping Use  ? Vaping Use: Never used  ?Substance Use Topics  ? Alcohol use: Yes  ?  Comment: weekly  ?  Drug use: No  ? ? ?Review of Systems  ?Constitutional:  Negative for chills and fever.  ?Respiratory:  Negative for cough.   ?Cardiovascular:  Negative for chest pain and palpitations.  ?Gastrointestinal:  Negative for nausea and vomiting.  ?Genitourinary:  Positive for frequency. Negative for dysuria.  ?Musculoskeletal:  Positive for arthralgias (heel pain).  ?Psychiatric/Behavioral:  Negative for suicidal ideas. The patient is nervous/anxious.   ?   ?Objective:  ?  ?BP (!) 146/90 (BP Location: Left Arm, Patient Position: Sitting, Cuff Size: Large)   Pulse 98   Temp 98.2 ?F (36.8 ?C) (Oral)   Ht '5\' 3"'$  (1.6 m)   Wt 152 lb 9.6 oz (69.2 kg)   LMP  (LMP Unknown)   SpO2 97%   BMI 27.03 kg/m?  ?BP Readings from Last 3 Encounters:  ?01/14/22 (!) 146/90   ?12/22/21 (!) 158/102  ?12/16/21 (!) 144/96  ? ?Wt Readings from Last 3 Encounters:  ?01/14/22 152 lb 9.6 oz (69.2 kg)  ?12/22/21 149 lb 9.6 oz (67.9 kg)  ?12/16/21 150 lb 12.8 oz (68.4 kg)  ? ? ?Physical Exam ?Vitals reviewed.  ?Constitutional:   ?   Appearance: She is well-developed.  ?Eyes:  ?   Conjunctiva/sclera: Conjunctivae normal.  ?Cardiovascular:  ?   Rate and Rhythm: Normal rate and regular rhythm.  ?   Pulses: Normal pulses.  ?   Heart sounds: Normal heart sounds.  ?Pulmonary:  ?   Effort: Pulmonary effort is normal.  ?   Breath sounds: Normal breath sounds. No wheezing, rhonchi or rales.  ?Skin: ?   General: Skin is warm and dry.  ?Neurological:  ?   Mental Status: She is alert.  ?Psychiatric:     ?   Speech: Speech normal.     ?   Behavior: Behavior normal.     ?   Thought Content: Thought content normal.  ? ? ?   ?Assessment & Plan:  ? ?Problem List Items Addressed This Visit   ? ?  ? Cardiovascular and Mediastinum  ? HTN (hypertension)  ?  Uncontrolled.  Continue amlodipine 10 mg.  Start losartan 25 mg.  Call out to patient to schedule CMP lab in 7 days time ? ?  ?  ? Relevant Medications  ? losartan (COZAAR) 25 MG tablet  ? Other Relevant Orders  ? POCT Urinalysis Dipstick  ?  ? Genitourinary  ? OAB (overactive bladder)  ?  Symptoms consistent with overactive bladder.  Pending urinalysis.  Start trospium 20 mg twice daily.  Counseled on anticholinergic side effects ? ?  ?  ? Relevant Medications  ? trospium (SANCTURA) 20 MG tablet  ?  ? Other  ? Anxiety and depression  ?  Uncontrolled.  Start Lexapro 10 mg.  Close follow-up ? ?  ?  ? Relevant Medications  ? escitalopram (LEXAPRO) 10 MG tablet  ? Depression - Primary  ? Relevant Medications  ? escitalopram (LEXAPRO) 10 MG tablet  ? Routine general medical examination at a health care facility  ? Relevant Orders  ? Urine Culture  ? ?Other Visit Diagnoses   ? ? Iron deficiency      ? ?  ? ? ? ?I have discontinued [name]. Kolden's gabapentin and  potassium chloride SA. I am also having her start on escitalopram, trospium, and losartan. Additionally, I am having her maintain her Aspirin-Caffeine, amLODipine, and predniSONE. ? ? ?Meds ordered this encounter  ?Medications  ? escitalopram (LEXAPRO) 10 MG tablet  ?  Sig: Take 1 tablet (10 mg total) by mouth daily.  ?  Dispense:  90 tablet  ?  Refill:  1  ?  Order Specific Question:   Supervising Provider  ?  Answer:   Crecencio Mc [[id number]]  ? trospium (SANCTURA) 20 MG tablet  ?  Sig: Take 1 tablet (20 mg total) by mouth 2 (two) times daily.  ?  Dispense:  120 tablet  ?  Refill:  1  ?  Order Specific Question:   Supervising Provider  ?  Answer:   Crecencio Mc [[id number]]  ? losartan (COZAAR) 25 MG tablet  ?  Sig: Take 1 tablet (25 mg total) by mouth daily.  ?  Dispense:  90 tablet  ?  Refill:  1  ?  Order Specific Question:   Supervising Provider  ?  Answer:   Crecencio Mc [[id number]]  ? ? ?Return precautions given.  ? ?Risks, benefits, and alternatives of the medications and treatment plan prescribed today were discussed, and patient expressed understanding.  ? ?Education regarding symptom management and diagnosis given to patient on AVS. ? ?Continue to follow with Burnard Hawthorne, FNP for routine health maintenance.  ? ?Regan Lemming and I agreed with plan.  ? ?Mable Paris, FNP ? ? ?

## 2022-01-15 LAB — CBC WITH DIFFERENTIAL/PLATELET
Basophils Absolute: 0.2 10*3/uL — ABNORMAL HIGH (ref 0.0–0.1)
Basophils Relative: 2.6 % (ref 0.0–3.0)
Eosinophils Absolute: 0 10*3/uL (ref 0.0–0.7)
Eosinophils Relative: 0 % (ref 0.0–5.0)
HCT: 39.4 % (ref 36.0–46.0)
Hemoglobin: 13.1 g/dL (ref 12.0–15.0)
Lymphocytes Relative: 22.4 % (ref 12.0–46.0)
Lymphs Abs: 2.1 10*3/uL (ref 0.7–4.0)
MCHC: 33.2 g/dL (ref 30.0–36.0)
MCV: 93.3 fl (ref 78.0–100.0)
Monocytes Absolute: 0.4 10*3/uL (ref 0.1–1.0)
Monocytes Relative: 4.7 % (ref 3.0–12.0)
Neutro Abs: 6.5 10*3/uL (ref 1.4–7.7)
Neutrophils Relative %: 70.3 % (ref 43.0–77.0)
Platelets: 475 10*3/uL — ABNORMAL HIGH (ref 150.0–400.0)
RBC: 4.23 Mil/uL (ref 3.87–5.11)
RDW: 15.4 % (ref 11.5–15.5)
WBC: 9.2 10*3/uL (ref 4.0–10.5)

## 2022-01-15 LAB — LIPID PANEL
Cholesterol: 247 mg/dL — ABNORMAL HIGH (ref 0–200)
HDL: 96.6 mg/dL (ref >39.00)
LDL Cholesterol: 131 mg/dL — ABNORMAL HIGH (ref 0–99)
NonHDL: 150.17
Total CHOL/HDL Ratio: 3
Triglycerides: 97 mg/dL (ref 0.0–149.0)
VLDL: 19.4 mg/dL (ref 0.0–40.0)

## 2022-01-15 LAB — TSH: TSH: 0.79 u[IU]/mL (ref 0.35–5.50)

## 2022-01-15 LAB — VITAMIN D 25 HYDROXY (VIT D DEFICIENCY, FRACTURES): VITD: 15.35 ng/mL — ABNORMAL LOW (ref 30.00–100.00)

## 2022-01-15 LAB — HEMOGLOBIN A1C: Hgb A1c MFr Bld: 5.8 % (ref 4.6–6.5)

## 2022-01-16 ENCOUNTER — Observation Stay (HOSPITAL_COMMUNITY): Payer: BC Managed Care – PPO

## 2022-01-16 ENCOUNTER — Observation Stay (HOSPITAL_BASED_OUTPATIENT_CLINIC_OR_DEPARTMENT_OTHER): Payer: BC Managed Care – PPO

## 2022-01-16 ENCOUNTER — Encounter (HOSPITAL_COMMUNITY): Payer: Self-pay

## 2022-01-16 ENCOUNTER — Observation Stay (HOSPITAL_COMMUNITY)
Admission: EM | Admit: 2022-01-16 | Discharge: 2022-01-17 | Disposition: A | Discharge status: Home / Self Care | Payer: BC Managed Care – PPO | Attending: Internal Medicine | Admitting: Internal Medicine

## 2022-01-16 ENCOUNTER — Other Ambulatory Visit: Payer: Self-pay

## 2022-01-16 ENCOUNTER — Emergency Department (HOSPITAL_COMMUNITY): Payer: BC Managed Care – PPO

## 2022-01-16 ENCOUNTER — Telehealth: Payer: Self-pay | Admitting: Family

## 2022-01-16 DIAGNOSIS — R55 Syncope and collapse: Principal | ICD-10-CM | POA: Insufficient documentation

## 2022-01-16 DIAGNOSIS — G459 Transient cerebral ischemic attack, unspecified: Secondary | ICD-10-CM | POA: Diagnosis present

## 2022-01-16 DIAGNOSIS — Z7982 Long term (current) use of aspirin: Secondary | ICD-10-CM | POA: Insufficient documentation

## 2022-01-16 DIAGNOSIS — F418 Other specified anxiety disorders: Secondary | ICD-10-CM | POA: Diagnosis not present

## 2022-01-16 DIAGNOSIS — F17210 Nicotine dependence, cigarettes, uncomplicated: Secondary | ICD-10-CM | POA: Insufficient documentation

## 2022-01-16 DIAGNOSIS — E559 Vitamin D deficiency, unspecified: Secondary | ICD-10-CM | POA: Diagnosis not present

## 2022-01-16 DIAGNOSIS — R29818 Other symptoms and signs involving the nervous system: Secondary | ICD-10-CM | POA: Diagnosis not present

## 2022-01-16 DIAGNOSIS — I10 Essential (primary) hypertension: Secondary | ICD-10-CM | POA: Insufficient documentation

## 2022-01-16 DIAGNOSIS — R110 Nausea: Secondary | ICD-10-CM | POA: Diagnosis not present

## 2022-01-16 DIAGNOSIS — Z79899 Other long term (current) drug therapy: Secondary | ICD-10-CM | POA: Insufficient documentation

## 2022-01-16 DIAGNOSIS — I629 Nontraumatic intracranial hemorrhage, unspecified: Secondary | ICD-10-CM | POA: Diagnosis not present

## 2022-01-16 DIAGNOSIS — R609 Edema, unspecified: Secondary | ICD-10-CM | POA: Diagnosis not present

## 2022-01-16 DIAGNOSIS — R200 Anesthesia of skin: Secondary | ICD-10-CM | POA: Diagnosis not present

## 2022-01-16 DIAGNOSIS — N3281 Overactive bladder: Secondary | ICD-10-CM | POA: Insufficient documentation

## 2022-01-16 LAB — I-STAT BETA HCG BLOOD, ED (MC, WL, AP ONLY): I-stat hCG, quantitative: 7.5 m[IU]/mL — ABNORMAL HIGH (ref <5)

## 2022-01-16 LAB — TROPONIN I (HIGH SENSITIVITY)
Troponin I (High Sensitivity): 6 ng/L (ref <18)
Troponin I (High Sensitivity): 6 ng/L (ref <18)

## 2022-01-16 LAB — BASIC METABOLIC PANEL
Anion gap: 16 — ABNORMAL HIGH (ref 5–15)
BUN: 11 mg/dL (ref 6–20)
CO2: 25 mmol/L (ref 22–32)
Calcium: 9.5 mg/dL (ref 8.9–10.3)
Chloride: 99 mmol/L (ref 98–111)
Creatinine, Ser: 0.72 mg/dL (ref 0.44–1.00)
GFR, Estimated: >60 mL/min (ref >60)
Glucose, Bld: 158 mg/dL — ABNORMAL HIGH (ref 70–99)
Potassium: 3.9 mmol/L (ref 3.5–5.1)
Sodium: 140 mmol/L (ref 135–145)

## 2022-01-16 LAB — URINALYSIS, ROUTINE W REFLEX MICROSCOPIC
Bilirubin Urine: NEGATIVE
Glucose, UA: NEGATIVE mg/dL
Ketones, ur: NEGATIVE mg/dL
Nitrite: NEGATIVE
Protein, ur: NEGATIVE mg/dL
Specific Gravity, Urine: 1.004 — ABNORMAL LOW (ref 1.005–1.030)
pH: 7 (ref 5.0–8.0)

## 2022-01-16 LAB — DIFFERENTIAL
Abs Immature Granulocytes: 0.1 10*3/uL — ABNORMAL HIGH (ref 0.00–0.07)
Basophils Absolute: 0 10*3/uL (ref 0.0–0.1)
Basophils Relative: 0 %
Eosinophils Absolute: 0 10*3/uL (ref 0.0–0.5)
Eosinophils Relative: 0 %
Immature Granulocytes: 1 %
Lymphocytes Relative: 14 %
Lymphs Abs: 1.5 10*3/uL (ref 0.7–4.0)
Monocytes Absolute: 0.3 10*3/uL (ref 0.1–1.0)
Monocytes Relative: 3 %
Neutro Abs: 8.9 10*3/uL — ABNORMAL HIGH (ref 1.7–7.7)
Neutrophils Relative %: 82 %

## 2022-01-16 LAB — I-STAT CHEM 8, ED
BUN: 12 mg/dL (ref 6–20)
Calcium, Ion: 0.97 mmol/L — ABNORMAL LOW (ref 1.15–1.40)
Chloride: 100 mmol/L (ref 98–111)
Creatinine, Ser: 0.5 mg/dL (ref 0.44–1.00)
Glucose, Bld: 161 mg/dL — ABNORMAL HIGH (ref 70–99)
HCT: 45 % (ref 36.0–46.0)
Hemoglobin: 15.3 g/dL — ABNORMAL HIGH (ref 12.0–15.0)
Potassium: 3.8 mmol/L (ref 3.5–5.1)
Sodium: 135 mmol/L (ref 135–145)
TCO2: 24 mmol/L (ref 22–32)

## 2022-01-16 LAB — CBC
HCT: 42.3 % (ref 36.0–46.0)
Hemoglobin: 14.1 g/dL (ref 12.0–15.0)
MCH: 30.9 pg (ref 26.0–34.0)
MCHC: 33.3 g/dL (ref 30.0–36.0)
MCV: 92.6 fL (ref 80.0–100.0)
Platelets: 534 10*3/uL — ABNORMAL HIGH (ref 150–400)
RBC: 4.57 MIL/uL (ref 3.87–5.11)
RDW: 15 % (ref 11.5–15.5)
WBC: 10.8 10*3/uL — ABNORMAL HIGH (ref 4.0–10.5)
nRBC: 0 % (ref 0.0–0.2)

## 2022-01-16 LAB — RAPID URINE DRUG SCREEN, HOSP PERFORMED
Amphetamines: NOT DETECTED
Barbiturates: NOT DETECTED
Benzodiazepines: NOT DETECTED
Cocaine: NOT DETECTED
Opiates: NOT DETECTED
Tetrahydrocannabinol: NOT DETECTED

## 2022-01-16 LAB — ECHOCARDIOGRAM COMPLETE
Area-P 1/2: 3.68 cm2
Height: 63 in
S' Lateral: 2.5 cm
Weight: 2441.6 oz

## 2022-01-16 LAB — PROTIME-INR
INR: 1 (ref 0.8–1.2)
Prothrombin Time: 13 seconds (ref 11.4–15.2)

## 2022-01-16 LAB — APTT: aPTT: 25 seconds (ref 24–36)

## 2022-01-16 LAB — D-DIMER, QUANTITATIVE: D-Dimer, Quant: 0.31 ug/mL-FEU (ref 0.00–0.50)

## 2022-01-16 LAB — ETHANOL: Alcohol, Ethyl (B): <10 mg/dL (ref <10)

## 2022-01-16 IMAGING — MR MR HEAD W/O CM
6 of 10 series · 29 of 48 positions shown · non-contrast
Comparison: CT from earlier the same day.

CLINICAL DATA: Initial evaluation for acute near syncope.

EXAM:
MRI HEAD WITHOUT CONTRAST
TECHNIQUE: Multiplanar, multiecho pulse sequences of the brain and surrounding
structures were obtained without intravenous contrast.

[Series 2: DWI · axial · 3.0mm · 0.94mm/px · z∈[-46,+117]mm · 9 of 116 slices shown (1 of 2)]
[im 1/116]
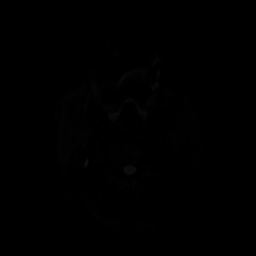
[im 15/116]
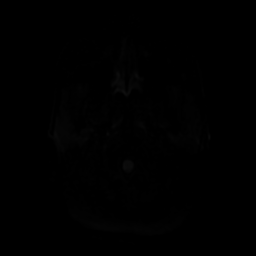
[im 29/116]
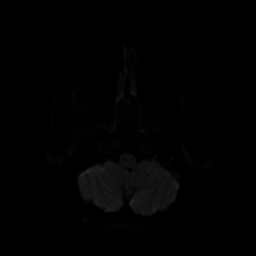
[im 44/116]
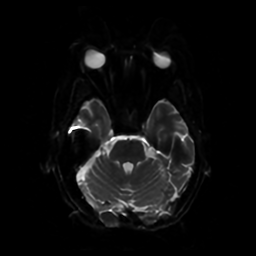
[im 58/116]
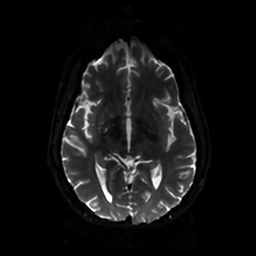
[im 72/116]
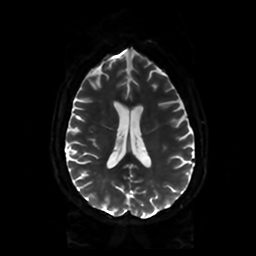
[im 87/116]
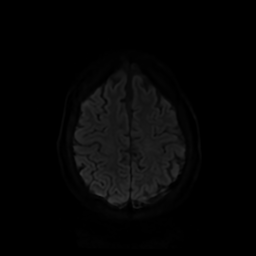
[im 101/116]
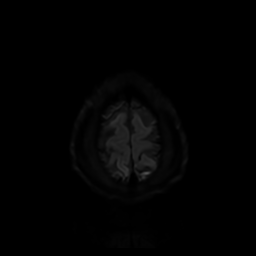
[im 116/116]
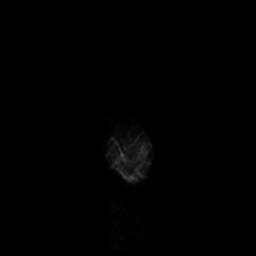

[Series 3: DWI · coronal · 4.0mm · 0.94mm/px · 7 of 78 slices shown (2 of 2)]
[im 1/78]
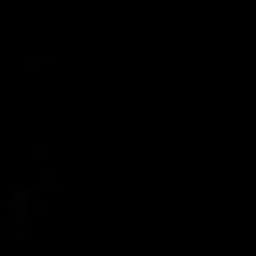
[im 13/78]
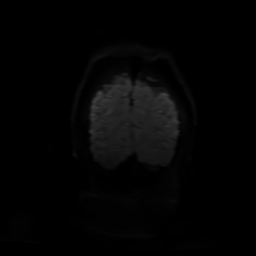
[im 26/78]
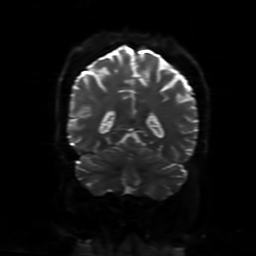
[im 39/78]
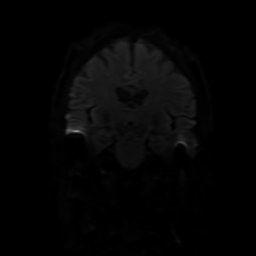
[im 52/78]
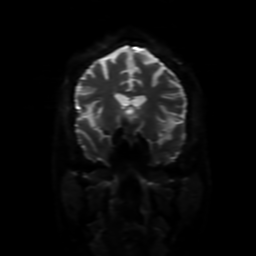
[im 65/78]
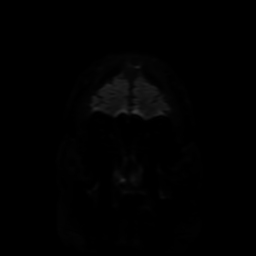
[im 78/78]
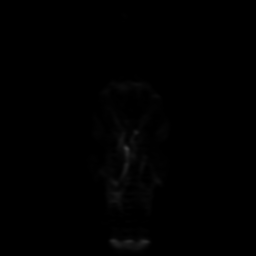

[Series 4: FLAIR · sagittal · 5.0mm · 0.23mm/px · 2 of 23 slices shown (1 of 2)]
[im 1/23]
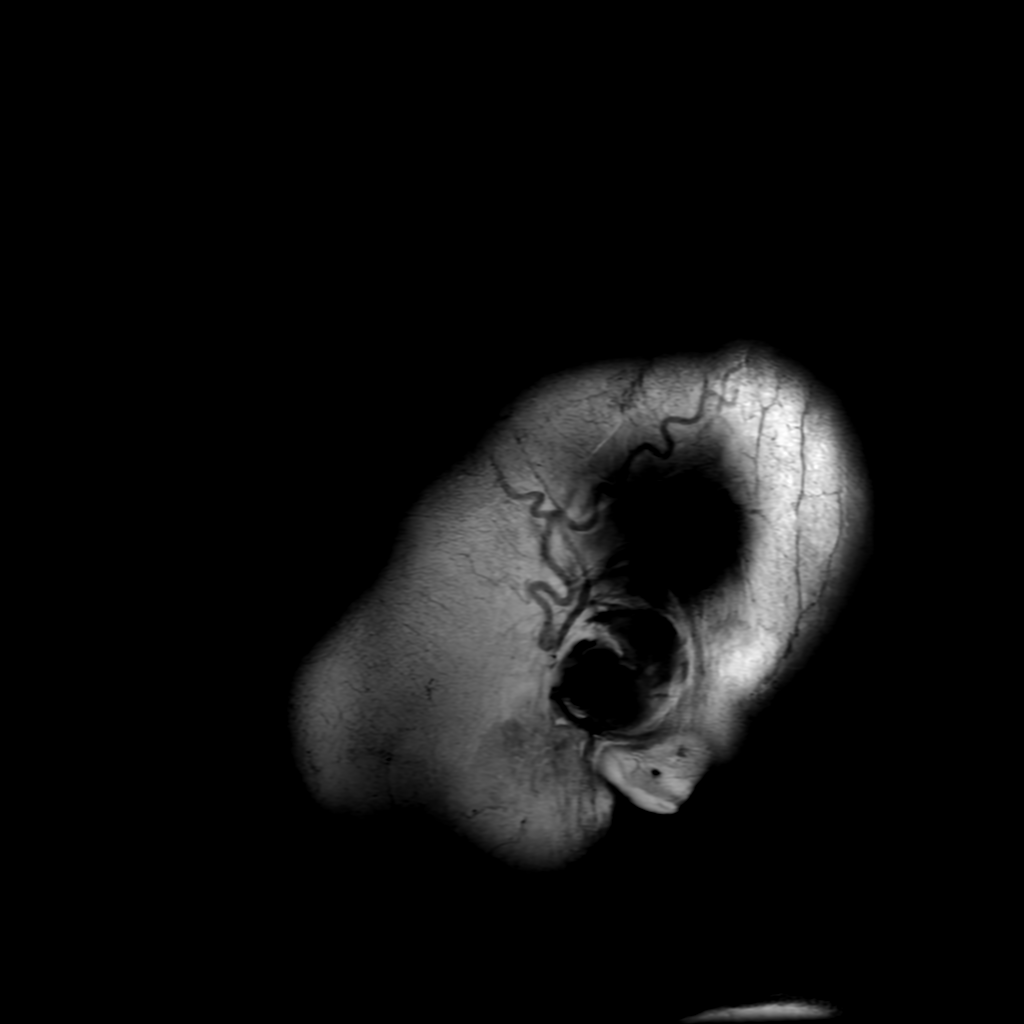
[im 23/23]
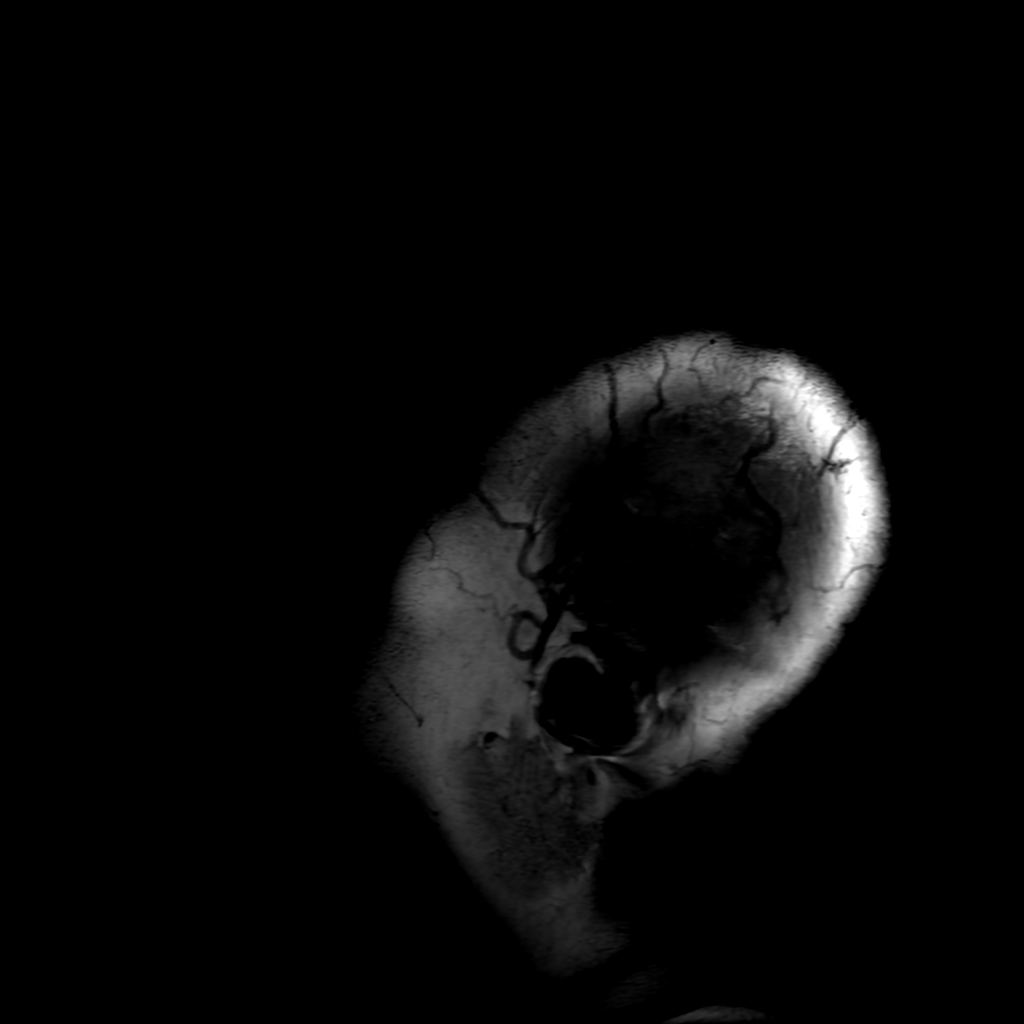

[Series 6: FLAIR · axial · 4.0mm · 0.45mm/px · z∈[-63,+94]mm · 3 of 30 slices shown (2 of 2)]
[im 1/30]
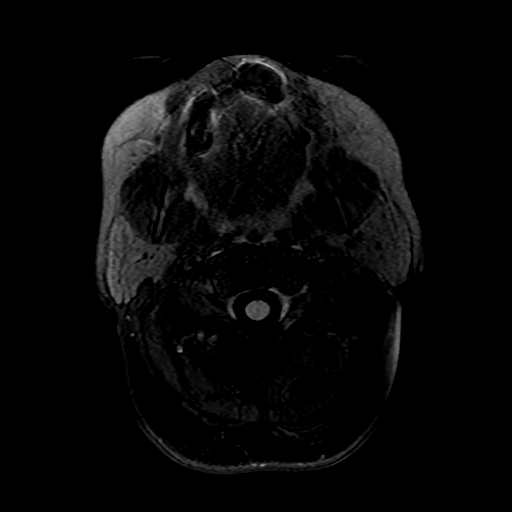
[im 15/30]
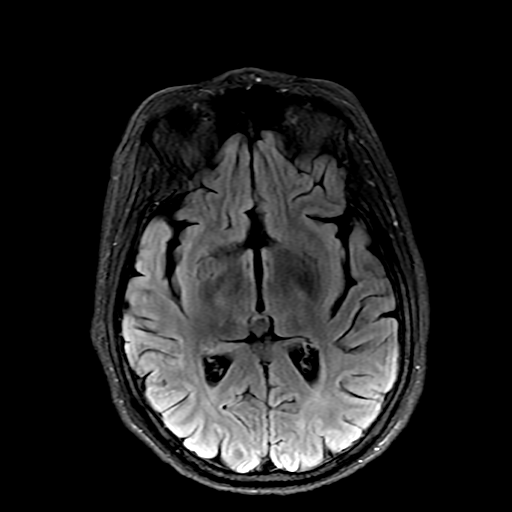
[im 30/30]
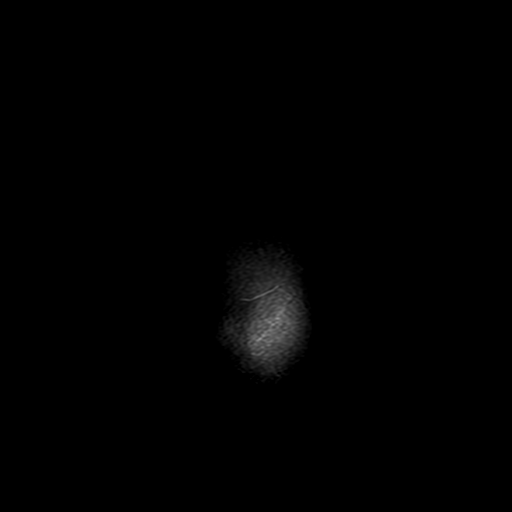

[Series 250: ADC · axial · 3.0mm · 0.94mm/px · z∈[-46,+117]mm · 5 of 58 slices shown (1 of 2)]
[im 1/58]
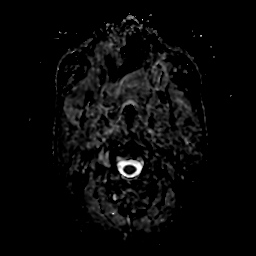
[im 15/58]
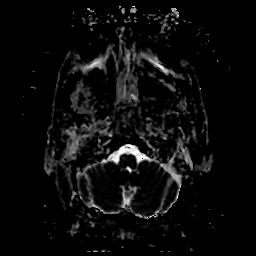
[im 29/58]
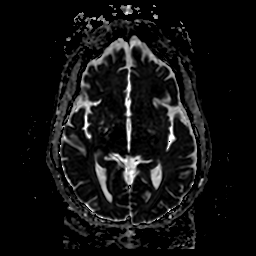
[im 43/58]
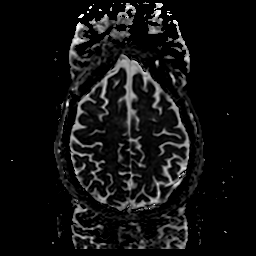
[im 58/58]
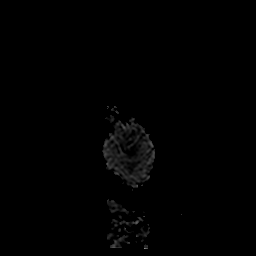

[Series 350: ADC · coronal · 4.0mm · 0.94mm/px · 3 of 36 slices shown (2 of 2)]
[im 1/36]
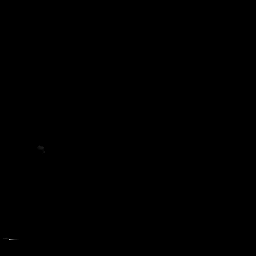
[im 18/36]
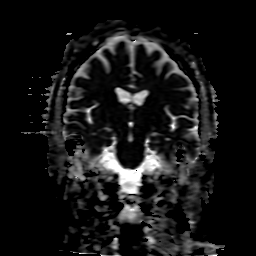
[im 36/36]
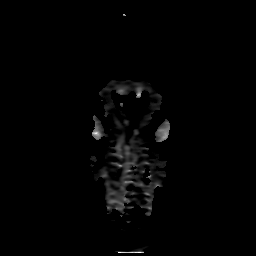

[29 of 48 positions shown; findings below may reference images not displayed]

FINDINGS: Brain: Cerebral volume within normal limits for patient age. No
focal parenchymal signal abnormality identified.

No abnormal foci of restricted diffusion to suggest acute or
subacute ischemia. Gray-white matter differentiation well
maintained. No encephalomalacia to suggest chronic infarction. No
foci of susceptibility artifact to suggest acute or chronic
intracranial hemorrhage.

No mass lesion, midline shift or mass effect. No hydrocephalus. No
extra-axial fluid collection. Major dural sinuses are grossly
patent.

Pituitary gland and suprasellar region are normal. Midline
structures intact and normal.

Vascular: Major intracranial vascular flow voids well maintained and
normal in appearance.

Skull and upper cervical spine: Craniocervical junction normal.
Visualized upper cervical spine within normal limits. Bone marrow
signal intensity normal. No scalp soft tissue abnormality.

Sinuses/Orbits: Globes and orbital soft tissues within normal
limits.

Mild scattered mucosal thickening about the ethmoidal air cells and
maxillary sinuses. Moderate bilateral mastoid effusions.

Other: None.
IMPRESSION: 1. Normal brain MRI. No acute intracranial abnormality identified.
2. Moderate bilateral mastoid effusions, of uncertain significance.
Correlation with physical exam recommended.

## 2022-01-16 MED ORDER — ASPIRIN EC 81 MG PO TBEC
81.0000 mg | DELAYED_RELEASE_TABLET | Freq: Every day | ORAL | Status: DC
  Administered 2022-01-17: 81 mg via ORAL
  Filled 2022-01-16: qty 1

## 2022-01-16 MED ORDER — ACETAMINOPHEN 325 MG PO TABS
650.0000 mg | ORAL_TABLET | ORAL | Status: DC | PRN
Start: 2022-01-16 — End: 2022-01-17

## 2022-01-16 MED ORDER — ENOXAPARIN SODIUM 40 MG/0.4ML IJ SOSY
40.0000 mg | PREFILLED_SYRINGE | INTRAMUSCULAR | Status: DC
  Administered 2022-01-16: 40 mg via SUBCUTANEOUS
  Filled 2022-01-16: qty 0.4

## 2022-01-16 MED ORDER — STROKE: EARLY STAGES OF RECOVERY BOOK
Freq: Once | Status: AC
  Filled 2022-01-16: qty 1

## 2022-01-16 MED ORDER — ESCITALOPRAM OXALATE 10 MG PO TABS
10.0000 mg | ORAL_TABLET | Freq: Every day | ORAL | Status: DC
  Filled 2022-01-16: qty 1

## 2022-01-16 MED ORDER — PREDNISONE 20 MG PO TABS
20.0000 mg | ORAL_TABLET | Freq: Every day | ORAL | Status: DC
  Administered 2022-01-17: 20 mg via ORAL
  Filled 2022-01-16: qty 1

## 2022-01-16 MED ORDER — ACETAMINOPHEN 650 MG RE SUPP: 650.0000 mg | RECTAL | Status: DC | PRN

## 2022-01-16 MED ORDER — PREDNISONE 20 MG PO TABS: 10.0000 mg | ORAL_TABLET | Freq: Every evening | ORAL | Status: DC

## 2022-01-16 MED ORDER — DARIFENACIN HYDROBROMIDE ER 15 MG PO TB24
15.0000 mg | ORAL_TABLET | Freq: Every day | ORAL | Status: DC
Start: 2022-01-16 — End: 2022-01-17
  Filled 2022-01-16 (×2): qty 1

## 2022-01-16 MED ORDER — PREDNISONE 20 MG PO TABS: 20.0000 mg | ORAL_TABLET | Freq: Every day | ORAL | Status: DC

## 2022-01-16 MED ORDER — ACETAMINOPHEN 160 MG/5ML PO SOLN: 650.0000 mg | ORAL | Status: DC | PRN

## 2022-01-16 MED ORDER — PERFLUTREN LIPID MICROSPHERE
1.0000 mL | INTRAVENOUS | Status: AC | PRN
  Administered 2022-01-16: 3 mL via INTRAVENOUS
  Filled 2022-01-16: qty 10

## 2022-01-16 MED ORDER — PREDNISONE 5 MG PO TABS
10.0000 mg | ORAL_TABLET | Freq: Every evening | ORAL | Status: DC
  Administered 2022-01-16: 10 mg via ORAL
  Filled 2022-01-16: qty 1

## 2022-01-16 MED ORDER — LACTATED RINGERS IV BOLUS
1000.0000 mL | Freq: Once | INTRAVENOUS | Status: AC
  Administered 2022-01-16: 1000 mL via INTRAVENOUS

## 2022-01-16 MED ORDER — SODIUM CHLORIDE 0.9 % IV SOLN: INTRAVENOUS | Status: DC

## 2022-01-16 MED ORDER — SENNOSIDES-DOCUSATE SODIUM 8.6-50 MG PO TABS: 1.0000 | ORAL_TABLET | Freq: Every evening | ORAL | Status: DC | PRN

## 2022-01-16 MED ORDER — LOSARTAN POTASSIUM 25 MG PO TABS
25.0000 mg | ORAL_TABLET | Freq: Every day | ORAL | Status: DC
  Filled 2022-01-16: qty 1

## 2022-01-16 NOTE — Assessment & Plan Note (Addendum)
Uncontrolled.  Start Lexapro 10 mg.  Close follow-up ?

## 2022-01-16 NOTE — Telephone Encounter (Signed)
Call patient ?I do not see where labs were collected.  She will need fasting labs 7 days after starting losartan 25 mg.  Please schedule ?

## 2022-01-16 NOTE — H&P (Signed)
?History and Physical  ? ? ?Judith Drake TIR:[id number] DOB: 1970-09-30 DOA: 01/16/2022 ? ?PCP: Burnard Hawthorne, FNP ?Patient coming from: home via [facility] ED ? ?Chief Complaint: near syncope - L sided numbness ? ?HPI:  ?51 year old with a history of HTN and undiagnosed rheumatologic disorder on chronic prednisone who presented to the ED after an episode of near syncope.  She awoke this morning, and before eating or drinking anything she took Lexapro and losartan for the first time.  About an hour later she began to feel as if she would pass out with general lightheadedness blurry vision and nausea.  This was accompanied by a significant sensation of numbness to the left side of the face and neck and tingling in both arms and legs.  She did not lose consciousness or fall to the ground.  This was followed with a generalized headache.  By the time she arrived in the ER her numbness had resolved.  She denied chest pain confusion dysarthria drooling facial droop or focal weakness. ? ?Assessment/Plan ? ?Presyncopal spell with left-sided numbness ?Multiple potential etiologies -we will place in observation to rule out possibility of TIA -check MRI brain -check carotid Dopplers and TTE ? ?LDL 131 on 01/14/22 as outpt ?A1c 5.8 on 01/14/22 as outpt ? ?Tobacco abuse ?Counsel on absolute need to stop smoking ? ?HTN ?Has been on amlodipine 10 mg chronically - Cozaar 25 mg started new 5/10 -practice permissive hypertension for now until stroke work-up completed  ? ?Overactive bladder ?Urinalysis unrevealing -trospium 20 mg twice daily initiated 5/10 ? ?Possible Rheumatologic disorder NOS / persistent bilateral foot and heel pain ?Pain appears to have responded well to courses of prednisone -ANA, rheumatoid factor, and CCP all normal in outpatient setting - being followed by a Rheumatologist as outpatient ? ?Vitamin D deficiency ?25 hydroxy vitamin D level appears to have been 15-16 both 2 days ago and 3 years ago when checked  previously - supplement  ? ?Anxiety/depression ?Lexapro initiated 5/10 ? ? ?DVT prophylaxis: Lovenox ?Code Status: Full ?Family Communication: No family present at time of admission ?Disposition Plan: Place in observation -hopeful for discharge home 5/13 if no acute findings on testing and clinically stable ?Consults called: None ? ?Review of Systems: As per HPI otherwise 10 point review of systems negative.  ? ?Past Medical History:  ?Diagnosis Date  ? Abnormal uterine bleeding (AUB) 03/20/2019  ? Acute lower UTI 05/12/2019  ? Acute respiratory failure with hypoxia ([place]) 04/10/2018  ? Anemia   ? Arthritis   ? lower back and hips  ? Dysmenorrhea 03/20/2019  ? Elevated liver enzymes 11/24/2013  ? ETOH abuse   ? Fatty liver 10/03/2019  ? Fibroid   ? GERD (gastroesophageal reflux disease)   ? Heavy menstrual period   ? Hypertension   ? Menorrhagia 12/13/2017  ? SIRS (systemic inflammatory response syndrome) ([place]) 05/12/2019  ? ? ?Past Surgical History:  ?Procedure Laterality Date  ? CYSTOSCOPY N/A 12/19/2019  ? Procedure: CYSTOSCOPY;  Surgeon: Aletha Halim, MD;  Location: [place];  Service: Gynecology;  Laterality: N/A;  ? ELBOW SURGERY    ? 1997   ? TOTAL LAPAROSCOPIC HYSTERECTOMY WITH SALPINGECTOMY Bilateral 12/19/2019  ? Procedure: TOTAL LAPAROSCOPIC HYSTERECTOMY WITH BILATERAL SALPINGECTOMY;  Surgeon: Aletha Halim, MD;  Location: [place];  Service: Gynecology;  Laterality: Bilateral;  ? TUBAL LIGATION    ? ? ?Family History  ?Family History  ?Problem Relation Age of Onset  ? Cancer Mother 65  ?  Lung   ? Hyperlipidemia Mother   ? Hypertension Mother   ? Stroke Mother   ? Kidney disease Mother   ? Diabetes Mother   ? Heart disease Mother   ? Depression Mother   ? Hyperlipidemia Father   ? Cancer Paternal Grandfather 66  ?     Colon Cancer - died  ? Autism Son   ? Breast cancer Neg Hx   ? ? ?Social History  ? reports that she has been smoking cigarettes. She has been smoking an average of .5 packs per day. She has never  used smokeless tobacco. She reports current alcohol use. She reports that she does not use drugs. ? ?Allergies ?Allergies  ?Allergen Reactions  ? Amoxicillin Nausea And Vomiting  ?  Has patient had a PCN reaction causing immediate rash, facial/tongue/throat swelling, SOB or lightheadedness with hypotension: No ?Has patient had a PCN reaction causing severe rash involving mucus membranes or skin necrosis: No ?Has patient had a PCN reaction that required hospitalization: No ?Has patient had a PCN reaction occurring within the last 10 years: No ?If all of the above answers are "NO", then may proceed with Cephalosporin use. ?  ? Dilaudid [Hydromorphone Hcl] Itching  ? Morphine And Related Itching  ? ? ?Prior to Admission medications   ?Medication Sig Start Date End Date Taking? Authorizing Provider  ?amLODipine (NORVASC) 10 MG tablet TAKE 1 TABLET BY MOUTH ONCE DAILY 08/26/21   Burnard Hawthorne, FNP  ?Aspirin-Caffeine 845-65 MG PACK Take 1 Package by mouth daily as needed (pain).    [provider]  ?escitalopram (LEXAPRO) 10 MG tablet Take 1 tablet (10 mg total) by mouth daily. 01/14/22   Burnard Hawthorne, FNP  ?losartan (COZAAR) 25 MG tablet Take 1 tablet (25 mg total) by mouth daily. 01/14/22   Burnard Hawthorne, FNP  ?predniSONE (STERAPRED UNI-PAK 48 TAB) 10 MG (48) TBPK tablet Take by mouth daily. 12 day dosepack po 12/22/21   Gregor Hams, MD  ?trospium (SANCTURA) 20 MG tablet Take 1 tablet (20 mg total) by mouth 2 (two) times daily. 01/14/22   Burnard Hawthorne, FNP  ? ? ?Physical Exam: ?Vitals:  ? 01/16/22 1330 01/16/22 1345 01/16/22 1400 01/16/22 1445  ?BP: (!) 147/91 137/88 (!) 141/87   ?Pulse: 91 95 92 95  ?Resp: '13 17 11 12  '$ ?Temp:      ?SpO2: 97% 97% 97% 96%  ?Weight:      ?Height:      ? ? ?Constitutional: NAD, calm, comfortable ?Eyes: PERRL, lids and conjunctivae normal ?ENMT: Mucous membranes are moist. Posterior pharynx clear of any exudate or lesions.Normal dentition.  ?Neck: normal,  supple, no masses, no thyromegaly ?Respiratory: clear to auscultation bilaterally, no wheezing, no crackles. Normal respiratory effort. No accessory muscle use.  ?Cardiovascular: Regular rate and rhythm, no murmurs / rubs / gallops. No extremity edema. 2+ pedal pulses. No carotid bruits.  ?Abdomen: no tenderness, no masses palpated. No hepatosplenomegaly. Bowel sounds positive.  ?Musculoskeletal: no clubbing / cyanosis. No joint deformity upper and lower extremities. Good ROM, no contractures. Normal muscle tone.  ?Skin: no rashes, lesions, ulcers. No induration ?Neurologic: CN 2-12 grossly intact. Sensation intact, DTR normal. Strength 5/5 in all 4.  ?Psychiatric: Normal judgment and insight. Alert and oriented x 3. Normal mood.  ? ? ?Labs on Admission:  ? ?CBC: ?Recent Labs  ?Lab 01/14/22 ?1600 01/16/22 ?1259 01/16/22 ?1324  ?WBC 9.2 10.8*  --   ?NEUTROABS 6.5 8.9*  --   ?  HGB 13.1 14.1 15.3*  ?HCT 39.4 42.3 45.0  ?MCV 93.3 92.6  --   ?PLT 475.0* 534*  --   ? ?Basic Metabolic Panel: ?Recent Labs  ?Lab 01/16/22 ?1259 01/16/22 ?1324  ?NA 140 135  ?K 3.9 3.8  ?CL 99 100  ?CO2 25  --   ?GLUCOSE 158* 161*  ?BUN 11 12  ?CREATININE 0.72 0.50  ?CALCIUM 9.5  --   ? ?GFR: ?Estimated Creatinine Clearance: 79.4 mL/min (by C-G formula based on SCr of 0.5 mg/dL). ? ?Coagulation Profile: ?Recent Labs  ?Lab 01/16/22 ?1259  ?INR 1.0  ? ?HbA1C: ?Recent Labs  ?  01/14/22 ?1600  ?HGBA1C 5.8  ? ? ?Lipid Profile: ?Recent Labs  ?  01/14/22 ?1600  ?CHOL 247*  ?HDL 96.60  ?LDLCALC 131*  ?TRIG 97.0  ?CHOLHDL 3  ? ?Thyroid Function Tests: ?Recent Labs  ?  01/14/22 ?1600  ?TSH 0.79  ? ? ?Urine analysis: ?   ?Component Value Date/Time  ? COLORURINE STRAW (A) 01/16/2022 1259  ? APPEARANCEUR CLEAR 01/16/2022 1259  ? LABSPEC 1.004 (L) 01/16/2022 1259  ? PHURINE 7.0 01/16/2022 1259  ? GLUCOSEU NEGATIVE 01/16/2022 1259  ? HGBUR SMALL (A) 01/16/2022 1259  ? [place] NEGATIVE 01/16/2022 1259  ? [place] NEGATIVE 01/16/2022 1259  ? PROTEINUR  NEGATIVE 01/16/2022 1259  ? UROBILINOGEN 0.2 06/02/2013 1800  ? NITRITE NEGATIVE 01/16/2022 1259  ? LEUKOCYTESUR TRACE (A) 01/16/2022 1259  ? ? ?Radiological Exams on Admission: ?CT Head Wo Contrast ? ?Result Date

## 2022-01-16 NOTE — Assessment & Plan Note (Addendum)
Uncontrolled.  Continue amlodipine 10 mg.  Start losartan 25 mg.  Call out to patient to schedule CMP lab in 7 days time ?

## 2022-01-16 NOTE — Progress Notes (Signed)
?  Echocardiogram ?2D Echocardiogram has been performed. ? ?Judith Drake ?01/16/2022, 6:03 PM ?

## 2022-01-16 NOTE — Assessment & Plan Note (Signed)
Symptoms consistent with overactive bladder.  Pending urinalysis.  Start trospium 20 mg twice daily.  Counseled on anticholinergic side effects ?

## 2022-01-16 NOTE — ED Triage Notes (Signed)
Pt arrived to ED via EMS from home w/ c/o near syncope. At 1015 pt experienced feeling flushed, nauseated, tunnel vision, felt the back of her neck feel hot and swelling, tingling in head, legs, arms, feet. Pt reports intermittent flare ups of bilateral shoulder/neck swelling that her regular doctors are trying to figure out still. Pt started losartan at 0800 and lexapro at 0900 today. These are new meds. ?

## 2022-01-16 NOTE — ED Provider Notes (Signed)
?[place] ?Provider Note ? ? ?CSN: [id number] ?Arrival date & time: 01/16/22  1234 ? ?  ? ?History ? ?Chief Complaint  ?Patient presents with  ? Near Syncope  ? ? ?Judith Drake is a 51 y.o. female. ? ?Patient with a history of hypertension, undiagnosed rheumatological disorder on prednisone here with episode of near syncope.  States she did not eat or drink anything today.  She started new medications today including losartan which she took at 8 AM as well as Lexapro which she took at 9 AM.  About 1 hour after taking her 9:00 medication she began to feel like she was going to pass out.  She had dizziness described as lightheadedness, blurry vision, nausea, numbness to the left side of her face and neck, tingling in her bilateral arms and legs.  She did not lose consciousness or fall or hit her head.  She feels improved now though still lightheaded and dizzy.  Has a gradual onset diffuse headache.  She feels the numbness to her left face has resolved.  Was short of breath at the time but is not currently.  No chest pain.  She feels her dizziness and lightheadedness is still persistent but has improved.  Did have 1 episode of vomiting and diarrhea at home.  Did not eat or drink anything since last night.  No abdominal pain or back pain. ? ?The history is provided by the patient.  ?Near Syncope ?Associated symptoms include headaches and shortness of breath. Pertinent negatives include no chest pain and no abdominal pain.  ? ?  ? ?Home Medications ?Prior to Admission medications   ?Medication Sig Start Date End Date Taking? Authorizing Provider  ?amLODipine (NORVASC) 10 MG tablet TAKE 1 TABLET BY MOUTH ONCE DAILY 08/26/21   Burnard Hawthorne, FNP  ?Aspirin-Caffeine 845-65 MG PACK Take 1 Package by mouth daily as needed (pain).    [provider]  ?escitalopram (LEXAPRO) 10 MG tablet Take 1 tablet (10 mg total) by mouth daily. 01/14/22   Burnard Hawthorne, FNP   ?losartan (COZAAR) 25 MG tablet Take 1 tablet (25 mg total) by mouth daily. 01/14/22   Burnard Hawthorne, FNP  ?predniSONE (STERAPRED UNI-PAK 48 TAB) 10 MG (48) TBPK tablet Take by mouth daily. 12 day dosepack po 12/22/21   Gregor Hams, MD  ?trospium (SANCTURA) 20 MG tablet Take 1 tablet (20 mg total) by mouth 2 (two) times daily. 01/14/22   Burnard Hawthorne, FNP  ?   ? ?Allergies    ?Amoxicillin, Dilaudid [hydromorphone hcl], and Morphine and related   ? ?Review of Systems   ?Review of Systems  ?Constitutional:  Positive for diaphoresis and fatigue. Negative for appetite change and fever.  ?HENT:  Negative for congestion and rhinorrhea.   ?Respiratory:  Positive for shortness of breath. Negative for chest tightness.   ?Cardiovascular:  Positive for near-syncope. Negative for chest pain and leg swelling.  ?Gastrointestinal:  Negative for abdominal pain, nausea and vomiting.  ?Genitourinary:  Negative for dysuria and hematuria.  ?Musculoskeletal:  Negative for arthralgias and myalgias.  ?Neurological:  Positive for dizziness, syncope, weakness, light-headedness and headaches.  ? all other systems are negative except as noted in the HPI and PMH.  ? ? ?Physical Exam ?Updated Vital Signs ?BP (!) 142/102 (BP Location: Right Arm)   Pulse 99   Temp 98.1 ?F (36.7 ?C)   Resp 18   Ht '5\' 3"'$  (1.6 m)   Wt 69.2 kg  LMP  (LMP Unknown)   SpO2 98%   BMI 27.03 kg/m?  ?Physical Exam ?Vitals and nursing note reviewed.  ?Constitutional:   ?   General: She is not in acute distress. ?   Appearance: She is well-developed.  ?HENT:  ?   Head: Normocephalic and atraumatic.  ?   Mouth/Throat:  ?   Pharynx: No oropharyngeal exudate.  ?Eyes:  ?   Conjunctiva/sclera: Conjunctivae normal.  ?   Pupils: Pupils are equal, round, and reactive to light.  ?Neck:  ?   Comments: No meningismus. ?Cardiovascular:  ?   Rate and Rhythm: Normal rate and regular rhythm.  ?   Heart sounds: Normal heart sounds. No murmur heard. ?Pulmonary:  ?    Effort: Pulmonary effort is normal. No respiratory distress.  ?   Breath sounds: Normal breath sounds.  ?Chest:  ?   Chest wall: No tenderness.  ?Abdominal:  ?   Palpations: Abdomen is soft.  ?   Tenderness: There is no abdominal tenderness. There is no guarding or rebound.  ?Musculoskeletal:     ?   General: No tenderness. Normal range of motion.  ?   Cervical back: Normal range of motion and neck supple.  ?Skin: ?   General: Skin is warm.  ?Neurological:  ?   Mental Status: She is alert and oriented to person, place, and time.  ?   Cranial Nerves: No cranial nerve deficit.  ?   Motor: No abnormal muscle tone.  ?   Coordination: Coordination normal.  ?   Comments: CN 2-12 intact, no ataxia on finger to nose, no nystagmus, 5/5 strength throughout, no pronator drift, Romberg negative, normal gait. ? ?Subjective paresthesias to bilateral upper and lower extremity  ?Psychiatric:     ?   Behavior: Behavior normal.  ? ? ?ED Results / Procedures / Treatments   ?Labs ?(all labs ordered are listed, but only abnormal results are displayed) ?Labs Reviewed  ?BASIC METABOLIC PANEL - Abnormal; Notable for the following components:  ?    Result Value  ? Glucose, Bld 158 (*)   ? Anion gap 16 (*)   ? All other components within normal limits  ?URINALYSIS, ROUTINE W REFLEX MICROSCOPIC - Abnormal; Notable for the following components:  ? Color, Urine STRAW (*)   ? Specific Gravity, Urine 1.004 (*)   ? Hgb urine dipstick SMALL (*)   ? Leukocytes,Ua TRACE (*)   ? Bacteria, UA RARE (*)   ? All other components within normal limits  ?DIFFERENTIAL - Abnormal; Notable for the following components:  ? Neutro Abs 8.9 (*)   ? Abs Immature Granulocytes 0.10 (*)   ? All other components within normal limits  ?CBC - Abnormal; Notable for the following components:  ? WBC 10.8 (*)   ? Platelets 534 (*)   ? All other components within normal limits  ?I-STAT BETA HCG BLOOD, ED ([facility], [facility], [facility] ONLY) - Abnormal; Notable for the following components:  ?  I-stat hCG, quantitative 7.5 (*)   ? All other components within normal limits  ?I-STAT CHEM 8, ED - Abnormal; Notable for the following components:  ? Glucose, Bld 161 (*)   ? Calcium, Ion 0.97 (*)   ? Hemoglobin 15.3 (*)   ? All other components within normal limits  ?ETHANOL  ?PROTIME-INR  ?APTT  ?RAPID URINE DRUG SCREEN, HOSP PERFORMED  ?D-DIMER, QUANTITATIVE  ?HIV ANTIBODY (ROUTINE TESTING W REFLEX)  ?TROPONIN I (HIGH SENSITIVITY)  ?TROPONIN I (HIGH SENSITIVITY)  ? ? ?  EKG ?EKG Interpretation ? ?Date/Time:  Friday Jan 16 2022 13:09:43 EDT ?Ventricular Rate:  90 ?PR Interval:  114 ?QRS Duration: 92 ?QT Interval:  369 ?QTC Calculation: 452 ?R Axis:   27 ?Text Interpretation: Sinus rhythm Borderline short PR interval LAE, consider biatrial enlargement No significant change was found Confirmed by Ezequiel Essex [phone]) on 01/16/2022 1:14:24 PM ? ?Radiology ?CT Head Wo Contrast ? ?Result Date: 01/16/2022 ?CLINICAL DATA:  Left-sided numbness.  Neuro deficit. EXAM: CT HEAD WITHOUT CONTRAST TECHNIQUE: Contiguous axial images were obtained from the base of the skull through the vertex without intravenous contrast. RADIATION DOSE REDUCTION: This exam was performed according to the departmental dose-optimization program which includes automated exposure control, adjustment of the mA and/or kV according to patient size and/or use of iterative reconstruction technique. COMPARISON:  02/24/2005 FINDINGS: Brain: There is no evidence for acute hemorrhage, hydrocephalus, mass lesion, or abnormal extra-axial fluid collection. No definite CT evidence for acute infarction. Vascular: No hyperdense vessel or unexpected calcification. Skull: No evidence for fracture. No worrisome lytic or sclerotic lesion. Sinuses/Orbits: The visualized paranasal sinuses and mastoid air cells are clear. Visualized portions of the globes and intraorbital fat are unremarkable. Other: None. IMPRESSION: Unremarkable exam.  No acute intracranial  abnormality. Electronically Signed   By: Misty Stanley M.D.   On: 01/16/2022 14:23  ? ?ECHOCARDIOGRAM COMPLETE ? ?Result Date: 01/16/2022 ?   ECHOCARDIOGRAM REPORT   Patient Name:   Judith Drake Date of Exam: 01/16/2022 Medical

## 2022-01-17 ENCOUNTER — Observation Stay (HOSPITAL_BASED_OUTPATIENT_CLINIC_OR_DEPARTMENT_OTHER): Payer: BC Managed Care – PPO

## 2022-01-17 DIAGNOSIS — G459 Transient cerebral ischemic attack, unspecified: Secondary | ICD-10-CM

## 2022-01-17 LAB — HIV ANTIBODY (ROUTINE TESTING W REFLEX): HIV Screen 4th Generation wRfx: NONREACTIVE

## 2022-01-17 NOTE — Evaluation (Signed)
Physical Therapy Evaluation & Discharge ?Patient Details ?Name: Judith Drake ?MRN: [id number] ?DOB: [birth year] ?Today's Date: 01/17/2022 ? ?History of Present Illness ? Pt is a 51 y.o. female admitted 01/16/22 with episode of near-syncope, L-side numbness, headache. Normal brain MRI. Workup for TIA. PMH includes HTN, arthritis. ?  ?Clinical Impression ? Patient evaluated by Physical Therapy with no further acute PT needs identified. 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 Today, pt indep with mobility and ADL tasks. Pt reports only residual symptom is slight decreased sensation in L dorsal hand & L cheek. Dynamic Gait Index score of 23/24 does not indicate risk for falls with higher level balance tasks. All education has been completed and the patient has no further questions. Acute PT is signing off. Thank you for this referral.    ? ?Recommendations for follow up therapy are one component of a multi-disciplinary discharge planning process, led by the attending physician.  Recommendations may be updated based on patient status, additional functional criteria and insurance authorization. ? ?Follow Up Recommendations No PT follow up ? ?  ?Assistance Recommended at Discharge None  ?Patient can return home with the following ?  N/A ? ?  ?Equipment Recommendations None recommended by PT  ?Recommendations for Other Services ?  N/A ?  ?Functional Status Assessment Patient has not had a recent decline in their functional status  ? ?  ?Precautions / Restrictions Precautions ?Precautions: None ?Restrictions ?Weight Bearing Restrictions: No  ? ?  ? ?Mobility ? Bed Mobility ?Overal bed mobility: Independent ?  ?  ?  ?  ?  ?  ?General bed mobility comments: bed flat ?  ? ?Transfers ?Overall transfer level: Independent ?Equipment used: None ?  ?  ?  ?  ?  ?  ?  ?  ?  ? ?Ambulation/Gait ?Ambulation/Gait assistance: Independent ?Gait Distance (Feet): 450 Feet ?Assistive device: None ?Gait  Pattern/deviations: WFL(Within Functional Limits) ?  ?  ?  ?  ? ?Stairs ?Stairs: Yes ?Stairs assistance: Independent ?Stair Management: No rails, Forwards, Alternating pattern ?Number of Stairs: 5 ?  ? ?Wheelchair Mobility ?  ? ?Modified Rankin (Stroke Patients Only) ?Modified Rankin (Stroke Patients Only) ?Pre-Morbid Rankin Score: No symptoms ?Modified Rankin: No symptoms ? ?  ? ?Balance Overall balance assessment: Independent ?  ?  ?  ?  ?  ?  ?  ?  ?  ?  ?  ?  ?  ?  ?  ?Standardized Balance Assessment ?Standardized Balance Assessment : Dynamic Gait Index ?  ?Dynamic Gait Index ?Level Surface: Normal ?Change in Gait Speed: Normal ?Gait with Horizontal Head Turns: Normal ?Gait with Vertical Head Turns: Normal ?Gait and Pivot Turn: Normal ?Step Over Obstacle: Mild Impairment ?Step Around Obstacles: Normal ?Steps: Normal ?Total Score: 23 ?   ? ? ? ?Pertinent Vitals/Pain Pain Assessment ?Pain Assessment: No/denies pain  ? ? ?Home Living Family/patient expects to be discharged to:: Private residence ?Living Arrangements: Children ?Available Help at Discharge: Family;Available PRN/intermittently ?Type of Home: House ?Home Access: Stairs to enter ?Entrance Stairs-Rails: Right ?Entrance Stairs-Number of Steps: 2 ?  ?Home Layout: One level ?Home Equipment: None ?Additional Comments: lives with adult son who has severe autism; daughter lives next door and pt's father nearby  ?  ?Prior Function Prior Level of Function : Independent/Modified Independent;Driving ?  ?  ?  ?  ?  ?  ?Mobility Comments: Indep without DME. 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  ?  ? ? ?  Hand Dominance  ?   ? ?  ?Extremity/Trunk Assessment  ? Upper Extremity Assessment ?Upper Extremity Assessment: Overall WFL for tasks assessed (reports on residual symptom is slight decreased sensation in L dorsal hand & L cheek) ?  ? ?Lower Extremity Assessment ?Lower Extremity Assessment: Overall WFL for tasks assessed ?  ? ?Cervical / Trunk  Assessment ?Cervical / Trunk Assessment: Normal  ?Communication  ? Communication: No difficulties  ?Cognition Arousal/Alertness: Awake/alert ?Behavior During Therapy: [facility] for tasks assessed/performed ?Overall Cognitive Status: Within Functional Limits for tasks assessed ?  ?  ?  ?  ?  ?  ?  ?  ?  ?  ?  ?  ?  ?  ?  ?  ?  ?  ?  ? ?  ?General Comments   ? ?  ?Exercises    ? ?Assessment/Plan  ?  ?PT Assessment Patient does not need any further PT services  ?PT Problem List   ? ?   ?  ?PT Treatment Interventions     ? ?PT Goals (Current goals can be found in the Care Plan section)  ?Acute Rehab PT Goals ?PT Goal Formulation: All assessment and education complete, DC therapy ? ?  ?Frequency   ?  ? ? ?Co-evaluation   ?  ?  ?  ?  ? ? ?  ?AM-PAC PT "6 Clicks" Mobility  ?Outcome Measure Help needed turning from your back to your side while in a flat bed without using bedrails?: None ?Help needed moving from lying on your back to sitting on the side of a flat bed without using bedrails?: None ?Help needed moving to and from a bed to a chair (including a wheelchair)?: None ?Help needed standing up from a chair using your arms (e.g., wheelchair or bedside chair)?: None ?Help needed to walk in hospital room?: None ?Help needed climbing 3-5 steps with a railing? : None ?6 Click Score: 24 ? ?  ?End of Session   ?Activity Tolerance: Patient tolerated treatment well ?Patient left: in bed;with call bell/phone within reach ?Nurse Communication: Mobility status ?PT Visit Diagnosis: Other abnormalities of gait and mobility (R26.89) ?  ? ?Time: 0254-2706 ?PT Time Calculation (min) (ACUTE ONLY): 10 min ? ? ?Charges:  *Did not charge; noted PT Eval order cancelled after evaluation already completed   ?  ?  ?   ?Mabeline Caras, PT, DPT ?Acute Rehabilitation Services  ?Pager [phone] ?Office [phone] ? ?Derry Lory ?01/17/2022, 9:13 AM ? ?

## 2022-01-17 NOTE — Progress Notes (Signed)
Carotid duplex has been completed. ? ? ?Results can be found under chart review under CV PROC. ?01/17/2022 3:47 PM ?Judith Drake RVT, RDMS ? ?

## 2022-01-17 NOTE — Discharge Summary (Signed)
?DISCHARGE SUMMARY ? ?Judith Drake ? ?MR#: [id number] ? ?DOB:Jan 22, 1971  ?Date of Admission: 01/16/2022 ?Date of Discharge: 01/17/2022 ? ?Attending Physician:Yuleimy Kretz Silvestre Gunner, MD ? ?Patient's GUY:QIHKVQ, Lyn Records, FNP ? ?Consults: None ? ?Disposition: Discharge home ? ?Follow-up Appts: ? Follow-up Information   ? ? Allegra Grana, FNP Follow up.   ?Specialty: Family Medicine ?Contact information: ?[address] ?[address] ?[place] [address] ?[phone] ? ? ?  ?  ? ?  ?  ? ?  ? ?Discharge Diagnoses: ?Presyncopal spell with left-sided numbness - unclear etiology ?Tobacco abuse ?HTN ?Overactive bladder ?Possible Rheumatologic disorder NOS / persistent bilateral foot and heel pain ?Vitamin D deficiency ?Anxiety/depression ? ?Initial presentation: ?50yo with a history of HTN and an undiagnosed rheumatologic disorder on chronic prednisone who presented to the ED after an episode of near syncope.  She awoke the morning of presentation with, and before eating or drinking anything she took lexapro, trospium, and losartan for the first time.  About an hour later she began to feel as if she would pass out with general lightheadedness blurry vision and nausea.  This was accompanied by a significant sensation of numbness to the left side of the face and neck and tingling in both arms and legs.  She did not lose consciousness or fall to the ground.  This was followed with a generalized headache.  By the time she arrived in the ER her numbness had resolved.  She denied chest pain confusion dysarthria drooling facial droop or focal weakness. ? ?Hospital Course: ? ?Presyncopal spell with left-sided numbness - unclear etiology ?CT head without acute findings ?MRI brain normal with no acute findings ?TTE noted preserved EF with no WMA and no other acute findings ?Bilateral carotid Dopplers with no clinically significant stenosis appreciated and bilateral vertebral artery antegrade flow ?LDL 131 on 01/14/22 as  outpt ?A1c 5.8 on 01/14/22 as outpt ?Sx resolved with patient back to her normal status at time of discharge.  Exact etiology of this spell not clear but TIA/stroke effectively ruled out.  It is possible this was related to the initiation of a couple of new medicines at once.  Patient was counseled that she can either hold the new medicines until she is able to follow-up with her PCP, or to resume their use in a stepwise fashion upon returning home with the recommendation being that she initiate 1 new medicine each day. ?  ?Tobacco abuse ?Counseled on absolute need to stop smoking ?  ?HTN ?Has been on amlodipine 10 mg chronically - Cozaar 25 mg started new 5/10 - resume usual and new blood pressure medications at time of discharge (as discussed above) with patient encouraged to make sure she is eating and drinking regularly ?  ?Overactive bladder ?Urinalysis unrevealing - trospium 20 mg twice daily initiated 5/10 and to be continued after discharge unless patient chooses to hold it until she sees her PCP ?  ?Possible Rheumatologic disorder NOS / persistent bilateral foot and heel pain ?Pain appears to have responded well to courses of prednisone - ANA, rheumatoid factor, and CCP all normal in outpatient setting - being followed by a Rheumatologist as outpatient -no change in treatment regimen -resume previously prescribed steroid dosing at time of discharge ?  ?Vitamin D deficiency ?25 hydroxy vitamin D level appears to have been 15-16 both 2 days ago and 3 years ago when checked previously - supplementation initiated ?  ?Anxiety/depression ?Lexapro initiated 5/10 -continue as outpatient as per discussion above ? ?Allergies  as of 01/17/2022   ? ?   Reactions  ? Amoxicillin Nausea And Vomiting  ? Has patient had a PCN reaction causing immediate rash, facial/tongue/throat swelling, SOB or lightheadedness with hypotension: No ?Has patient had a PCN reaction causing severe rash involving mucus membranes or skin necrosis:  No ?Has patient had a PCN reaction that required hospitalization: No ?Has patient had a PCN reaction occurring within the last 10 years: No ?If all of the above answers are "NO", then may proceed with Cephalosporin use.  ? Dilaudid [hydromorphone Hcl] Itching  ? Morphine And Related Itching  ? ?  ? ?  ?Medication List  ?  ? ?TAKE these medications   ? ?amLODipine 10 MG tablet ?Commonly known as: NORVASC ?TAKE 1 TABLET BY MOUTH ONCE DAILY ?  ?Aspirin-Caffeine 845-65 MG Pack ?Take 1 Package by mouth daily as needed (pain). ?  ?escitalopram 10 MG tablet ?Commonly known as: Lexapro ?Take 1 tablet (10 mg total) by mouth daily. ?  ?losartan 25 MG tablet ?Commonly known as: COZAAR ?Take 1 tablet (25 mg total) by mouth daily. ?  ?predniSONE 20 MG tablet ?Commonly known as: DELTASONE ?Take 10-20 mg by mouth as directed. Take 20 mg in the morning and Take 10 mg in evening for 5 Days Then Take 1 tablet (20 mg) in the morning for 5 Days ?What changed: Another medication with the same name was removed. Continue taking this medication, and follow the directions you see here. ?  ?trospium 20 MG tablet ?Commonly known as: SANCTURA ?Take 1 tablet (20 mg total) by mouth 2 (two) times daily. ?  ? ?  ? ? ?Day of Discharge ?BP 134/88 (BP Location: Left Arm)   Pulse 83   Temp 98.8 ?F (37.1 ?C) (Oral)   Resp 14   Ht 5\' 3"  (1.6 m)   Wt 69.2 kg   LMP  (LMP Unknown)   SpO2 96%   BMI 27.03 kg/m?  ? ?Physical Exam: ?General: No acute respiratory distress ?Lungs: Clear to auscultation bilaterally without wheezes or crackles ?Cardiovascular: Regular rate and rhythm without murmur gallop or rub normal S1 and S2 ?Abdomen: Nontender, nondistended, soft, bowel sounds positive, no rebound, no ascites, no appreciable mass ?Extremities: No significant cyanosis, clubbing, or edema bilateral lower extremities ? ?Basic Metabolic Panel: ?Recent Labs  ?Lab 01/16/22 ?1259 01/16/22 ?1324  ?NA 140 135  ?K 3.9 3.8  ?CL 99 100  ?CO2 25  --   ?GLUCOSE  158* 161*  ?BUN 11 12  ?CREATININE 0.72 0.50  ?CALCIUM 9.5  --   ? ? ?Coags: ?Recent Labs  ?Lab 01/16/22 ?1259  ?INR 1.0  ? ? ?CBC: ?Recent Labs  ?Lab 01/14/22 ?1600 01/16/22 ?1259 01/16/22 ?1324  ?WBC 9.2 10.8*  --   ?NEUTROABS 6.5 8.9*  --   ?HGB 13.1 14.1 15.3*  ?HCT 39.4 42.3 45.0  ?MCV 93.3 92.6  --   ?PLT 475.0* 534*  --   ? ? ?Time spent in discharge (includes decision making & examination of pt): ?30 minutes ? ?01/17/2022, 5:01 PM  ? ?Lonia Blood, MD ?[facility] ?Office  [phone] ? ? ? ? ? ?

## 2022-01-19 ENCOUNTER — Telehealth: Payer: Self-pay | Admitting: Family

## 2022-01-19 DIAGNOSIS — M1990 Unspecified osteoarthritis, unspecified site: Secondary | ICD-10-CM | POA: Diagnosis not present

## 2022-01-19 DIAGNOSIS — M79671 Pain in right foot: Secondary | ICD-10-CM | POA: Diagnosis not present

## 2022-01-19 DIAGNOSIS — M7989 Other specified soft tissue disorders: Secondary | ICD-10-CM | POA: Diagnosis not present

## 2022-01-19 DIAGNOSIS — M47819 Spondylosis without myelopathy or radiculopathy, site unspecified: Secondary | ICD-10-CM | POA: Diagnosis not present

## 2022-01-19 NOTE — Telephone Encounter (Addendum)
Pt called in requesting appt for Hosp Follow Up... Pt stated that she had a mini stroke from  medication that she think was prescribe to her... Pt didn't say how far out she needs to be seen... Pt requesting callback  ?

## 2022-01-20 NOTE — Telephone Encounter (Signed)
Patient has scheduled appointment on 01/21/22 for hospital follow up ?

## 2022-01-20 NOTE — Telephone Encounter (Signed)
Have you reached out to patient? ?

## 2022-01-21 ENCOUNTER — Encounter: Payer: Self-pay | Admitting: Family

## 2022-01-21 ENCOUNTER — Ambulatory Visit (INDEPENDENT_AMBULATORY_CARE_PROVIDER_SITE_OTHER): Payer: BC Managed Care – PPO | Admitting: Family

## 2022-01-21 VITALS — BP 132/68 | HR 105 | Temp 98.9°F | Ht 63.0 in | Wt 152.0 lb

## 2022-01-21 DIAGNOSIS — E785 Hyperlipidemia, unspecified: Secondary | ICD-10-CM

## 2022-01-21 DIAGNOSIS — I10 Essential (primary) hypertension: Secondary | ICD-10-CM

## 2022-01-21 DIAGNOSIS — R899 Unspecified abnormal finding in specimens from other organs, systems and tissues: Secondary | ICD-10-CM | POA: Diagnosis not present

## 2022-01-21 DIAGNOSIS — E559 Vitamin D deficiency, unspecified: Secondary | ICD-10-CM

## 2022-01-21 DIAGNOSIS — F419 Anxiety disorder, unspecified: Secondary | ICD-10-CM

## 2022-01-21 DIAGNOSIS — F32A Depression, unspecified: Secondary | ICD-10-CM

## 2022-01-21 MED ORDER — ESCITALOPRAM OXALATE 10 MG PO TABS: 10.0000 mg | ORAL_TABLET | Freq: Every day | ORAL | 1 refills | Status: DC

## 2022-01-21 MED ORDER — CHOLECALCIFEROL 1.25 MG (50000 UT) PO TABS: ORAL_TABLET | ORAL | 0 refills | Status: DC

## 2022-01-21 NOTE — Telephone Encounter (Signed)
Patient has lab appointment on 03/04/22  ?

## 2022-01-21 NOTE — Assessment & Plan Note (Signed)
The 10-year ASCVD risk score (Oren Barella DK, et al., 2019) is: 3.1% ?Discussed elevated cholesterol and LDL cholesterol.  Advise diet low in trans and saturated fat.  Also advised low glycemic diet in the setting of prediabetes. ?

## 2022-01-21 NOTE — Assessment & Plan Note (Signed)
Excellent control of blood pressure.  Discussed at length that I suspect losartan may be the culprit for dizziness.  I think is very appropriate for patient to be maintained on amlodipine '10mg'$  only at this time.  Emphasized the importance of continuing to spot check blood pressure at home to ensure that we are reaching goal of less than 120/80. ?

## 2022-01-21 NOTE — Progress Notes (Signed)
? ?Subjective:  ? ? Patient ID: Judith Drake, female    DOB: 1971/01/14, 51 y.o.   MRN: [id number] ? ?CC: [name] is a 51 y.o. female who presents today for hospital follow up.  ? ?HPI: Feels well today.  Dizziness has resolved.  Blood pressure has been well controlled without losartan and she questions if elevatioon related to heel pain. She had follow-up rheumatology, Dr Kathi Ludwig, 5/1/523 who has extended prednisone until follow-up again in 4 weeks time ? ?HTN - compliant with amlodipine 10mg . No cp, sob, dizziness.  ? ?She is not currently on Lexapro ? ?Presented to emergency room for near syncopal episode.  It was the first time she had taken Lexapro and losartan. One hour later she felt she would pass out due to lightheadedness. ?Admitted 01/16/22 and discharged 01/17/22 ?Ct head without acute findings ?MRI without acute findings ?Bilateral carotid US without clinically significant stenosis ?Echocardiogram 01/16/22 shows left ventricular ejection fraction 60 to 65%. ?TIA/CVA effectively ruled out.  ?Advised to initiate in step wise fashion by one new medicine per day ? ? ? ?HISTORY:  ?Past Medical History:  ?Diagnosis Date  ? Abnormal uterine bleeding (AUB) 03/20/2019  ? Acute lower UTI 05/12/2019  ? Acute respiratory failure with hypoxia (HCC) 04/10/2018  ? Anemia   ? Arthritis   ? lower back and hips  ? Dysmenorrhea 03/20/2019  ? Elevated liver enzymes 11/24/2013  ? ETOH abuse   ? Fatty liver 10/03/2019  ? Fibroid   ? GERD (gastroesophageal reflux disease)   ? Heavy menstrual period   ? Hypertension   ? Menorrhagia 12/13/2017  ? SIRS (systemic inflammatory response syndrome) (HCC) 05/12/2019  ? ?Past Surgical History:  ?Procedure Laterality Date  ? CYSTOSCOPY N/A 12/19/2019  ? Procedure: CYSTOSCOPY;  Surgeon: Nora Bing, MD;  Location: [facility] OR;  Service: Gynecology;  Laterality: N/A;  ? ELBOW SURGERY    ? 1997   ? TOTAL LAPAROSCOPIC HYSTERECTOMY WITH SALPINGECTOMY Bilateral 12/19/2019  ? Procedure: TOTAL  LAPAROSCOPIC HYSTERECTOMY WITH BILATERAL SALPINGECTOMY;  Surgeon: [place] Bing, MD;  Location: [facility] OR;  Service: Gynecology;  Laterality: Bilateral;  ? TUBAL LIGATION    ? ?Family History  ?Problem Relation Age of Onset  ? Cancer Mother 17  ?     Lung   ? Hyperlipidemia Mother   ? Hypertension Mother   ? Stroke Mother   ? Kidney disease Mother   ? Diabetes Mother   ? Heart disease Mother   ? Depression Mother   ? Hyperlipidemia Father   ? Cancer Paternal Grandfather 16  ?     Colon Cancer - died  ? Autism Son   ? Breast cancer Neg Hx   ? ? ?Allergies: Amoxicillin, Dilaudid [hydromorphone hcl], and Morphine and related ?Current Outpatient Medications on File Prior to Visit  ?Medication Sig Dispense Refill  ? amLODipine (NORVASC) 10 MG tablet TAKE 1 TABLET BY MOUTH ONCE DAILY 30 tablet 5  ? Aspirin-Caffeine 845-65 MG PACK Take 1 Package by mouth daily as needed (pain).    ? predniSONE (DELTASONE) 20 MG tablet Take 10-20 mg by mouth as directed. Take 20 mg in the morning and Take 10 mg in evening for 5 Days Then Take 1 tablet (20 mg) in the morning for 5 Days    ? ?No current facility-administered medications on file prior to visit.  ? ? ?Social History  ? ?Tobacco Use  ? Smoking status: Every Day  ?  Packs/day: 0.50  ?  Types:  Cigarettes  ? Smokeless tobacco: Never  ?Vaping Use  ? Vaping Use: Never used  ?Substance Use Topics  ? Alcohol use: Yes  ?  Comment: weekly  ? Drug use: No  ? ? ?Review of Systems  ?Constitutional:  Negative for chills and fever.  ?Respiratory:  Negative for cough.   ?Cardiovascular:  Negative for chest pain and palpitations.  ?Gastrointestinal:  Negative for nausea and vomiting.  ?Neurological:  Negative for dizziness.  ?   ?Objective:  ?  ?BP 132/68 (BP Location: Left Arm, Patient Position: Sitting, Cuff Size: Normal)   Pulse (!) 105   Temp 98.9 ?F (37.2 ?C) (Oral)   Ht 5\' 3"  (1.6 m)   Wt 152 lb (68.9 kg)   LMP  (LMP Unknown)   SpO2 98%   BMI 26.93 kg/m?  ?BP Readings from Last 3  Encounters:  ?01/21/22 132/68  ?01/17/22 134/88  ?01/14/22 (!) 146/90  ? ?Wt Readings from Last 3 Encounters:  ?01/21/22 152 lb (68.9 kg)  ?01/16/22 152 lb 9.6 oz (69.2 kg)  ?01/14/22 152 lb 9.6 oz (69.2 kg)  ? ? ?Physical Exam ?Vitals reviewed.  ?Constitutional:   ?   Appearance: She is well-developed.  ?Eyes:  ?   Conjunctiva/sclera: Conjunctivae normal.  ?Cardiovascular:  ?   Rate and Rhythm: Normal rate and regular rhythm.  ?   Pulses: Normal pulses.  ?   Heart sounds: Normal heart sounds.  ?Pulmonary:  ?   Effort: Pulmonary effort is normal.  ?   Breath sounds: Normal breath sounds. No wheezing, rhonchi or rales.  ?Skin: ?   General: Skin is warm and dry.  ?Neurological:  ?   Mental Status: She is alert.  ?Psychiatric:     ?   Speech: Speech normal.     ?   Behavior: Behavior normal.     ?   Thought Content: Thought content normal.  ? ? ?   ?Assessment & Plan:  ? ?Problem List Items Addressed This Visit   ? ?  ? Cardiovascular and Mediastinum  ? HTN (hypertension)  ?  Excellent control of blood pressure.  Discussed at length that I suspect losartan may be the culprit for dizziness.  I think is very appropriate for patient to be maintained on amlodipine 10mg  only at this time.  Emphasized the importance of continuing to spot check blood pressure at home to ensure that we are reaching goal of less than 120/80. ? ?  ?  ?  ? Other  ? Anxiety and depression  ?  We discussed at length hospitalization and reviewed hospital course.  Dizziness has fortunately resolved.  Dizziness is reported per UTD in up to 7% of patients on Lexapro; I advised less likely in my opinion to suspect Lexapro and more likely to be losartan to have caused dizziness.  We agreed that she may retry Lexapro at some point in the next couple of weeks.  She will do this cautiously and let me know how she is doing. ? ?  ?  ? Relevant Medications  ? escitalopram (LEXAPRO) 10 MG tablet  ? Depression  ? Relevant Medications  ? escitalopram (LEXAPRO) 10  MG tablet  ? HLD (hyperlipidemia)  ?  The 10-year ASCVD risk score (Norine Reddington DK, et al., 2019) is: 3.1% ?Discussed elevated cholesterol and LDL cholesterol.  Advise diet low in trans and saturated fat.  Also advised low glycemic diet in the setting of prediabetes. ?  ?  ? Vitamin D deficiency  ?  Relevant Medications  ? Cholecalciferol 1.25 MG (50000 UT) TABS  ? ?Other Visit Diagnoses   ? ? Abnormal laboratory test    -  Primary  ? Relevant Orders  ? CBC with Differential/Platelet  ? ?  ? ? ? ?I have discontinued Zachary George. Klauer's trospium and losartan. I am also having her start on Cholecalciferol. Additionally, I am having her maintain her Aspirin-Caffeine, amLODipine, predniSONE, and escitalopram. ? ? ?Meds ordered this encounter  ?Medications  ? escitalopram (LEXAPRO) 10 MG tablet  ?  Sig: Take 1 tablet (10 mg total) by mouth daily.  ?  Dispense:  90 tablet  ?  Refill:  1  ?  Order Specific Question:   Supervising Provider  ?  Answer:   Sherlene Shams [[id number]]  ? Cholecalciferol 1.25 MG (50000 UT) TABS  ?  Sig: 50,000 units PO qwk for 8 weeks.  ?  Dispense:  8 tablet  ?  Refill:  0  ?  Order Specific Question:   Supervising Provider  ?  Answer:   Sherlene Shams [[id number]]  ? ? ?Return precautions given.  ? ?Risks, benefits, and alternatives of the medications and treatment plan prescribed today were discussed, and patient expressed understanding.  ? ?Education regarding symptom management and diagnosis given to patient on AVS. ? ?Continue to follow with Allegra Grana, FNP for routine health maintenance.  ? ?Sheilah Mins and I agreed with plan.  ? ?Rennie Plowman, FNP ? ? ?

## 2022-01-21 NOTE — Assessment & Plan Note (Signed)
We discussed at length hospitalization and reviewed hospital course.  Dizziness has fortunately resolved.  Dizziness is reported per UTD in up to 7% of patients on Lexapro; I advised less likely in my opinion to suspect Lexapro and more likely to be losartan to have caused dizziness.  We agreed that she may retry Lexapro at some point in the next couple of weeks.  She will do this cautiously and let me know how she is doing. ?

## 2022-02-11 ENCOUNTER — Encounter: Payer: BC Managed Care – PPO | Admitting: Family

## 2022-03-03 DIAGNOSIS — M1990 Unspecified osteoarthritis, unspecified site: Secondary | ICD-10-CM | POA: Diagnosis not present

## 2022-03-03 DIAGNOSIS — M7989 Other specified soft tissue disorders: Secondary | ICD-10-CM | POA: Diagnosis not present

## 2022-03-03 DIAGNOSIS — M79671 Pain in right foot: Secondary | ICD-10-CM | POA: Diagnosis not present

## 2022-03-03 DIAGNOSIS — M47819 Spondylosis without myelopathy or radiculopathy, site unspecified: Secondary | ICD-10-CM | POA: Diagnosis not present

## 2022-03-04 ENCOUNTER — Other Ambulatory Visit: Payer: BC Managed Care – PPO

## 2022-03-06 ENCOUNTER — Other Ambulatory Visit: Payer: Self-pay | Admitting: Family

## 2022-03-06 DIAGNOSIS — I10 Essential (primary) hypertension: Secondary | ICD-10-CM

## 2022-03-20 ENCOUNTER — Other Ambulatory Visit: Payer: BC Managed Care – PPO

## 2022-03-24 ENCOUNTER — Ambulatory Visit (HOSPITAL_COMMUNITY)
Admission: EM | Admit: 2022-03-24 | Discharge: 2022-03-24 | Disposition: A | Discharge status: Home / Self Care | Payer: BC Managed Care – PPO | Attending: Family Medicine | Admitting: Family Medicine

## 2022-03-24 ENCOUNTER — Emergency Department (HOSPITAL_COMMUNITY): Payer: BC Managed Care – PPO

## 2022-03-24 ENCOUNTER — Encounter (HOSPITAL_COMMUNITY): Payer: Self-pay

## 2022-03-24 ENCOUNTER — Ambulatory Visit (INDEPENDENT_AMBULATORY_CARE_PROVIDER_SITE_OTHER): Payer: BC Managed Care – PPO

## 2022-03-24 ENCOUNTER — Other Ambulatory Visit: Payer: Self-pay

## 2022-03-24 ENCOUNTER — Encounter (HOSPITAL_COMMUNITY): Payer: Self-pay | Admitting: Emergency Medicine

## 2022-03-24 ENCOUNTER — Observation Stay (HOSPITAL_COMMUNITY)
Admission: EM | Admit: 2022-03-24 | Discharge: 2022-03-27 | Disposition: A | Discharge status: Home / Self Care | Payer: BC Managed Care – PPO | Attending: Orthopedic Surgery | Admitting: Orthopedic Surgery

## 2022-03-24 DIAGNOSIS — S82891A Other fracture of right lower leg, initial encounter for closed fracture: Secondary | ICD-10-CM | POA: Diagnosis not present

## 2022-03-24 DIAGNOSIS — Z8673 Personal history of transient ischemic attack (TIA), and cerebral infarction without residual deficits: Secondary | ICD-10-CM | POA: Insufficient documentation

## 2022-03-24 DIAGNOSIS — Z79899 Other long term (current) drug therapy: Secondary | ICD-10-CM | POA: Insufficient documentation

## 2022-03-24 DIAGNOSIS — I10 Essential (primary) hypertension: Secondary | ICD-10-CM | POA: Diagnosis not present

## 2022-03-24 DIAGNOSIS — S82841A Displaced bimalleolar fracture of right lower leg, initial encounter for closed fracture: Principal | ICD-10-CM | POA: Insufficient documentation

## 2022-03-24 DIAGNOSIS — S9304XA Dislocation of right ankle joint, initial encounter: Secondary | ICD-10-CM

## 2022-03-24 DIAGNOSIS — F17210 Nicotine dependence, cigarettes, uncomplicated: Secondary | ICD-10-CM | POA: Insufficient documentation

## 2022-03-24 DIAGNOSIS — W108XXA Fall (on) (from) other stairs and steps, initial encounter: Secondary | ICD-10-CM | POA: Insufficient documentation

## 2022-03-24 DIAGNOSIS — S82391A Other fracture of lower end of right tibia, initial encounter for closed fracture: Secondary | ICD-10-CM | POA: Diagnosis not present

## 2022-03-24 DIAGNOSIS — S82431A Displaced oblique fracture of shaft of right fibula, initial encounter for closed fracture: Secondary | ICD-10-CM | POA: Diagnosis not present

## 2022-03-24 DIAGNOSIS — S82831A Other fracture of upper and lower end of right fibula, initial encounter for closed fracture: Secondary | ICD-10-CM | POA: Diagnosis not present

## 2022-03-24 DIAGNOSIS — S82001A Unspecified fracture of right patella, initial encounter for closed fracture: Secondary | ICD-10-CM | POA: Diagnosis not present

## 2022-03-24 DIAGNOSIS — M7989 Other specified soft tissue disorders: Secondary | ICD-10-CM | POA: Diagnosis not present

## 2022-03-24 MED ORDER — DEXTROSE-NACL 5-0.9 % IV SOLN: INTRAVENOUS | Status: DC

## 2022-03-24 MED ORDER — ACETAMINOPHEN 500 MG PO TABS: 500.0000 mg | ORAL_TABLET | Freq: Four times a day (QID) | ORAL | Status: DC

## 2022-03-24 MED ORDER — MORPHINE SULFATE (PF) 2 MG/ML IV SOLN
0.5000 mg | INTRAVENOUS | Status: DC | PRN
  Filled 2022-03-24: qty 1

## 2022-03-24 MED ORDER — METHOCARBAMOL 500 MG PO TABS
500.0000 mg | ORAL_TABLET | Freq: Four times a day (QID) | ORAL | Status: DC | PRN
  Administered 2022-03-24 – 2022-03-27 (×8): 500 mg via ORAL
  Filled 2022-03-24 (×8): qty 1

## 2022-03-24 MED ORDER — KETOROLAC TROMETHAMINE 30 MG/ML IJ SOLN
30.0000 mg | Freq: Once | INTRAMUSCULAR | Status: AC
  Administered 2022-03-24: 30 mg via INTRAMUSCULAR

## 2022-03-24 MED ORDER — SODIUM CHLORIDE 0.9 % IV SOLN
INTRAVENOUS | Status: AC | PRN
  Administered 2022-03-24: 1000 mL via INTRAVENOUS

## 2022-03-24 MED ORDER — ACETAMINOPHEN 325 MG PO TABS: 325.0000 mg | ORAL_TABLET | Freq: Four times a day (QID) | ORAL | Status: DC | PRN

## 2022-03-24 MED ORDER — PROPOFOL 10 MG/ML IV BOLUS
INTRAVENOUS | Status: AC | PRN
  Administered 2022-03-24: 50 ug via INTRAVENOUS
  Administered 2022-03-24 (×2): 20 mg via INTRAVENOUS
  Administered 2022-03-24: 10 mg via INTRAVENOUS
  Administered 2022-03-24: 30 mg via INTRAVENOUS
  Administered 2022-03-24: 40 mg via INTRAVENOUS
  Administered 2022-03-24: 30 mg via INTRAVENOUS

## 2022-03-24 MED ORDER — PROPOFOL 10 MG/ML IV BOLUS: 30.0000 mg | Freq: Once | INTRAVENOUS | Status: DC

## 2022-03-24 MED ORDER — HYDROCODONE-ACETAMINOPHEN 5-325 MG PO TABS
1.0000 | ORAL_TABLET | ORAL | Status: DC | PRN
  Administered 2022-03-24 – 2022-03-25 (×4): 2 via ORAL
  Filled 2022-03-24 (×4): qty 2

## 2022-03-24 MED ORDER — NAPROXEN 375 MG PO TABS: 375.0000 mg | ORAL_TABLET | Freq: Two times a day (BID) | ORAL | 0 refills | Status: DC

## 2022-03-24 MED ORDER — OXYCODONE-ACETAMINOPHEN 5-325 MG PO TABS
1.0000 | ORAL_TABLET | Freq: Once | ORAL | Status: AC
Start: 2022-03-24 — End: 2022-03-24
  Administered 2022-03-24: 1 via ORAL
  Filled 2022-03-24: qty 1

## 2022-03-24 MED ORDER — METHOCARBAMOL 1000 MG/10ML IJ SOLN: 500.0000 mg | Freq: Four times a day (QID) | INTRAVENOUS | Status: DC | PRN

## 2022-03-24 MED ORDER — KETOROLAC TROMETHAMINE 30 MG/ML IJ SOLN
INTRAMUSCULAR | Status: AC
  Filled 2022-03-24: qty 1

## 2022-03-24 MED ORDER — HYDROMORPHONE HCL 1 MG/ML IJ SOLN
0.5000 mg | Freq: Once | INTRAMUSCULAR | Status: AC
  Administered 2022-03-24: 0.5 mg via INTRAVENOUS
  Filled 2022-03-24: qty 1

## 2022-03-24 MED ORDER — OXYCODONE HCL 5 MG PO TABS: 5.0000 mg | ORAL_TABLET | ORAL | 0 refills | Status: DC | PRN

## 2022-03-24 MED ORDER — HYDROCODONE-ACETAMINOPHEN 7.5-325 MG PO TABS: 1.0000 | ORAL_TABLET | ORAL | Status: DC | PRN

## 2022-03-24 NOTE — Consult Note (Signed)
Reason for Consult:Right ankle fx Referring Physician: Debe Coder Kommor Time called: [id number] Time at bedside: [place] is an 51 y.o. female.  HPI: Judith Drake fell while going down some stairs earlier today. She had immediate right ankle pain and could not bear weight. [facility] where [facility] showed an ankle fx/dislocation and orthopedic surgery was consulted. She was transferred to [facility] for reduction. She works as a Building control surveyor.  Past Medical History:  Diagnosis Date   Abnormal uterine bleeding (AUB) 03/20/2019   Acute lower UTI 05/12/2019   Acute respiratory failure with hypoxia (HCC) 04/10/2018   Anemia    Arthritis    lower back and hips   Dysmenorrhea 03/20/2019   Elevated liver enzymes 11/24/2013   ETOH abuse    Fatty liver 10/03/2019   Fibroid    GERD (gastroesophageal reflux disease)    Heavy menstrual period    Hypertension    Menorrhagia 12/13/2017   SIRS (systemic inflammatory response syndrome) ([place]) 05/12/2019    Past Surgical History:  Procedure Laterality Date   CYSTOSCOPY N/A 12/19/2019   Procedure: CYSTOSCOPY;  Surgeon: Aletha Halim, MD;  Location: [facility];  Service: Gynecology;  Laterality: N/A;   ELBOW SURGERY     1997    TOTAL LAPAROSCOPIC HYSTERECTOMY WITH SALPINGECTOMY Bilateral 12/19/2019   Procedure: TOTAL LAPAROSCOPIC HYSTERECTOMY WITH BILATERAL SALPINGECTOMY;  Surgeon: Aletha Halim, MD;  Location: [facility];  Service: Gynecology;  Laterality: Bilateral;   TUBAL LIGATION      Family History  Problem Relation Age of Onset   Cancer Mother 13       Lung    Hyperlipidemia Mother    Hypertension Mother    Stroke Mother    Kidney disease Mother    Diabetes Mother    Heart disease Mother    Depression Mother    Hyperlipidemia Father    Cancer Paternal Grandfather 108       Colon Cancer - died   Autism Son    Breast cancer Neg Hx     Social History:  reports that she has been smoking cigarettes. She has been smoking an average of .5 packs per  day. She has never used smokeless tobacco. She reports current alcohol use. She reports that she does not use drugs.  Allergies:  Allergies  Allergen Reactions   Amoxicillin Nausea And Vomiting    Has patient had a PCN reaction causing immediate rash, facial/tongue/throat swelling, SOB or lightheadedness with hypotension: No Has patient had a PCN reaction causing severe rash involving mucus membranes or skin necrosis: No Has patient had a PCN reaction that required hospitalization: No Has patient had a PCN reaction occurring within the last 10 years: No If all of the above answers are "NO", then may proceed with Cephalosporin use.    Dilaudid [Hydromorphone Hcl] Itching   Morphine And Related Itching    Medications: I have reviewed the patient's current medications.  No results found for this or any previous visit (from the past 48 hour(s)).  DG Ankle Complete Right  Result Date: 03/24/2022 CLINICAL DATA:  Trauma, fall EXAM: RIGHT ANKLE - COMPLETE 3+ VIEW COMPARISON:  None Available. FINDINGS: There is a oblique fracture in the distal shaft of right fibula. There is approximally 9 mm distraction of the fracture fragments. There is posterior dislocation of talus in relation to distal articular surface of tibia. There is comminuted fracture in the posterior aspect of distal right tibia. Plantar spur is seen in calcaneus. IMPRESSION: Fracture  dislocation of right ankle. There is displaced fracture in the distal shaft of fibula. Electronically Signed   By: Elmer Picker M.D.   On: 03/24/2022 12:07   DG Foot Complete Right  Result Date: 03/24/2022 CLINICAL DATA:  Trauma, fall EXAM: RIGHT FOOT COMPLETE - 3+ VIEW COMPARISON:  None Available. FINDINGS: Single lateral view of right foot shows fracture or dislocation in the right ankle. There is possible fracture in the distal shaft of fibula. There is soft tissue swelling over the dorsum of the foot. Evaluation of bony structures in the foot  is limited without the AP and oblique views. Plantar spur is seen in calcaneus. IMPRESSION: Fracture dislocation in right ankle. Electronically Signed   By: Elmer Picker M.D.   On: 03/24/2022 12:05    Review of Systems  HENT:  Negative for ear discharge, ear pain, hearing loss and tinnitus.   Eyes:  Negative for photophobia and pain.  Respiratory:  Negative for cough and shortness of breath.   Cardiovascular:  Negative for chest pain.  Gastrointestinal:  Negative for abdominal pain, nausea and vomiting.  Genitourinary:  Negative for dysuria, flank pain, frequency and urgency.  Musculoskeletal:  Positive for arthralgias (Right ankle). Negative for back pain, myalgias and neck pain.  Neurological:  Negative for dizziness and headaches.  Hematological:  Does not bruise/bleed easily.  Psychiatric/Behavioral:  The patient is not nervous/anxious.    Blood pressure (!) 157/100, pulse (!) 111, temperature 98.7 F (37.1 C), temperature source Oral, resp. rate 17, SpO2 100 %. Physical Exam Constitutional:      General: She is not in acute distress.    Appearance: She is well-developed. She is not diaphoretic.  HENT:     Head: Normocephalic and atraumatic.  Eyes:     General: No scleral icterus.       Right eye: No discharge.        Left eye: No discharge.     Conjunctiva/sclera: Conjunctivae normal.  Cardiovascular:     Rate and Rhythm: Normal rate and regular rhythm.  Pulmonary:     Effort: Pulmonary effort is normal. No respiratory distress.  Musculoskeletal:     Cervical back: Normal range of motion.     Comments: RLE No traumatic wounds or rash  Ankle swollen and ecchymotic  No knee effusion  Knee stable to varus/ valgus and anterior/posterior stress  Sens DPN, SPN, TN intact  Motor EHL 5/5  DP 2+, No significant edema  Skin:    General: Skin is warm and dry.  Neurological:     Mental Status: She is alert.  Psychiatric:        Mood and Affect: Mood normal.         Behavior: Behavior normal.     Assessment/Plan: Right ankle fx -- Will reduce. Dr. Marlou Sa to decide on immediate vs delayed ORIF based on swelling.    Lisette Abu, PA-C Orthopedic Surgery [phone] 03/24/2022, 3:08 PM

## 2022-03-24 NOTE — ED Triage Notes (Signed)
Pt states fell down her patio steps last night and her rt foot went under her. C/o pain, swelling, and unable to move it. Took a BC powder this am.

## 2022-03-24 NOTE — Progress Notes (Signed)
Orthopedic Tech Progress Note Patient Details:  Judith Drake [birth year] [id number]  Patient will get crutches once she has had surgery and therapy makes sure patient can use them   Patient ID: Judith Drake, female   DOB: December 13, 1970, 51 y.o.   MRN: [id number]  Judith Drake 03/24/2022, 4:26 PM

## 2022-03-24 NOTE — ED Notes (Signed)
Per OR, pt is to have scheduled ORIF at 5pm tomorrow evening (7/19).

## 2022-03-24 NOTE — Procedures (Signed)
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 Consent obtained and time out performed. Sedation given but never seemed to fully take effect. Able to reduce ankle and splint but pt's muscle tone makes me worried for substandard reduction. X-rays pending.        Lisette Abu, PA-C Orthopedic Surgery [phone]

## 2022-03-24 NOTE — ED Triage Notes (Signed)
Pt states she twisted her R ankle on steps last night and fell down with her foot under her.  Sent from [facility] with + R ankle fracture and dislocation.

## 2022-03-24 NOTE — ED Provider Notes (Signed)
[facility] EMERGENCY DEPARTMENT Provider Note  CSN: [id number] Arrival date & time: 03/24/22 1221  Chief Complaint(s) Ankle Injury  HPI Judith Drake is a 51 y.o. female with PMH alcohol use, abnormal uterine bleeding, TIA who presents emergency department as a transfer from the urgent care due to concern for an ankle fracture.  Patient states that she fell down stairs earlier today.  Denies head strike or loss of consciousness.  Denies additional traumatic complaints.  X-ray imaging obtained at urgent care that showed an ankle fracture dislocation and was transferred to the emergency department for reduction and orthopedic consultation.  Patient arrives with significant pain at the right ankle with associated swelling and obvious deformity.   Past Medical History Past Medical History:  Diagnosis Date   Abnormal uterine bleeding (AUB) 03/20/2019   Acute lower UTI 05/12/2019   Acute respiratory failure with hypoxia (HCC) 04/10/2018   Anemia    Arthritis    lower back and hips   Dysmenorrhea 03/20/2019   Elevated liver enzymes 11/24/2013   ETOH abuse    Fatty liver 10/03/2019   Fibroid    GERD (gastroesophageal reflux disease)    Heavy menstrual period    Hypertension    Menorrhagia 12/13/2017   SIRS (systemic inflammatory response syndrome) ([place]) 05/12/2019   Patient Active Problem List   Diagnosis Date Noted   Vitamin D deficiency 01/21/2022   HLD (hyperlipidemia) 01/21/2022   TIA (transient ischemic attack) 01/16/2022   OAB (overactive bladder) 01/14/2022   Hand pain 07/19/2020   Dysplasia of cervix, 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   Status post hysterectomy 12/19/2019   Elevated transaminase level 12/18/2019   Hypomagnesemia    Nonallopathic lesion of sacral region 03/28/2018   Nonallopathic lesion of thoracic region 03/28/2018   Nonallopathic lesion of lumbosacral region 03/28/2018   Nonallopathic lesion of pelvic region 03/28/2018   Nonallopathic lesion of  cervical region 03/28/2018   Enlarged thyroid 03/07/2018   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 Authorizing Provider  amLODipine (NORVASC) 10 MG tablet TAKE 1 TABLET BY MOUTH ONCE DAILY 03/06/22  Yes Burnard Hawthorne, FNP  Aspirin-Caffeine [phone] MG PACK Take 1 Package by mouth daily as needed (pain).   Yes [provider]  hydroxychloroquine (PLAQUENIL) 200 MG tablet Take 400 mg by mouth daily. 03/03/22  Yes [provider]  naproxen (NAPROSYN) 375 MG tablet Take 1 tablet (375 mg total) by mouth 2 (two) times daily. 03/24/22  Yes Alizaya Oshea, MD  oxyCODONE (ROXICODONE) 5 MG immediate release tablet Take 1 tablet (5 mg total) by mouth every 4 (four) hours as needed for severe pain. 03/24/22  Yes Grainne Knights, MD  predniSONE (DELTASONE) 5 MG tablet Take 5 mg by mouth daily with breakfast.   Yes [provider]  Cholecalciferol 1.25 MG (50000 UT) TABS 50,000 units PO qwk for 8 weeks. Patient not taking: Reported on 03/24/2022 01/21/22   Burnard Hawthorne, FNP  escitalopram (  LEXAPRO) 10 MG tablet Take 1 tablet (10 mg total) by mouth daily. Patient not taking: Reported on 03/24/2022 01/21/22   Burnard Hawthorne, FNP                                                                                                                                    Past Surgical History Past Surgical History:  Procedure Laterality Date    CYSTOSCOPY N/A 12/19/2019   Procedure: CYSTOSCOPY;  Surgeon: Aletha Halim, MD;  Location: [place];  Service: Gynecology;  Laterality: N/A;   ELBOW SURGERY     1997    TOTAL LAPAROSCOPIC HYSTERECTOMY WITH SALPINGECTOMY Bilateral 12/19/2019   Procedure: TOTAL LAPAROSCOPIC HYSTERECTOMY WITH BILATERAL SALPINGECTOMY;  Surgeon: Aletha Halim, MD;  Location: [place];  Service: Gynecology;  Laterality: Bilateral;   TUBAL LIGATION     Family History Family History  Problem Relation Age of Onset   Cancer Mother 17       Lung    Hyperlipidemia Mother    Hypertension Mother    Stroke Mother    Kidney disease Mother    Diabetes Mother    Heart disease Mother    Depression Mother    Hyperlipidemia Father    Cancer Paternal Grandfather 42       Colon Cancer - died   Autism Son    Breast cancer Neg Hx     Social History Social History   Tobacco Use   Smoking status: Every Day    Packs/day: 0.50    Types: Cigarettes   Smokeless tobacco: Never  Vaping Use   Vaping Use: Never used  Substance Use Topics   Alcohol use: Yes    Comment: weekly   Drug use: No   Allergies Amoxicillin, Dilaudid [hydromorphone hcl], and Morphine and related  Review of Systems Review of Systems  Musculoskeletal:  Positive for arthralgias, joint swelling and myalgias.    Physical Exam Vital Signs  I have reviewed the triage vital signs BP (!) 147/90   Pulse 95   Temp 98.7 F (37.1 C) (Oral)   Resp 14   LMP  (LMP Unknown)   SpO2 96%   Physical Exam Vitals and nursing note reviewed.  Constitutional:      General: She is not in acute distress.    Appearance: She is well-developed.  HENT:     Head: Normocephalic and atraumatic.  Eyes:     Conjunctiva/sclera: Conjunctivae normal.  Cardiovascular:     Rate and Rhythm: Normal rate and regular rhythm.     Heart sounds: No murmur heard. Pulmonary:     Effort: Pulmonary effort is normal. No respiratory distress.     Breath sounds: Normal  breath sounds.  Abdominal:     Palpations: Abdomen is soft.     Tenderness: There is no abdominal tenderness.  Musculoskeletal:        General: Swelling, tenderness, deformity and signs of injury  present.     Cervical back: Neck supple.  Skin:    General: Skin is warm and dry.     Capillary Refill: Capillary refill takes less than 2 seconds.  Neurological:     Mental Status: She is alert.  Psychiatric:        Mood and Affect: Mood normal.     ED Results and Treatments Labs (all labs ordered are listed, but only abnormal results are displayed) Labs Reviewed - No data to display                                                                                                                        Radiology DG Ankle Right Port  Result Date: 03/24/2022 CLINICAL DATA:  Right ankle fracture, status post reduction and casting EXAM: PORTABLE RIGHT ANKLE - 2 VIEW COMPARISON:  Radiograph performed earlier on the same date. FINDINGS: Overlying cast obscures fine osseous detail. Interval improvement of the tibiotalar joint now in near anatomic alignment. Redemonstration of the displaced fracture of the posterior tibia and oblique fracture of the fibula. IMPRESSION: 1. Status post reduction and casting radiographs demonstrate improved alignment of the tibiotalar joint. 2. Redemonstration of the oblique fracture of the distal fibula and posterior malleolus. Electronically Signed   By: Keane Police D.O.   On: 03/24/2022 15:34   DG Ankle Complete Right  Result Date: 03/24/2022 CLINICAL DATA:  Trauma, fall EXAM: RIGHT ANKLE - COMPLETE 3+ VIEW COMPARISON:  None Available. FINDINGS: There is a oblique fracture in the distal shaft of right fibula. There is approximally 9 mm distraction of the fracture fragments. There is posterior dislocation of talus in relation to distal articular surface of tibia. There is comminuted fracture in the posterior aspect of distal right tibia. Plantar spur is seen in  calcaneus. IMPRESSION: Fracture dislocation of right ankle. There is displaced fracture in the distal shaft of fibula. Electronically Signed   By: Elmer Picker M.D.   On: 03/24/2022 12:07   DG Foot Complete Right  Result Date: 03/24/2022 CLINICAL DATA:  Trauma, fall EXAM: RIGHT FOOT COMPLETE - 3+ VIEW COMPARISON:  None Available. FINDINGS: Single lateral view of right foot shows fracture or dislocation in the right ankle. There is possible fracture in the distal shaft of fibula. There is soft tissue swelling over the dorsum of the foot. Evaluation of bony structures in the foot is limited without the AP and oblique views. Plantar spur is seen in calcaneus. IMPRESSION: Fracture dislocation in right ankle. Electronically Signed   By: Elmer Picker M.D.   On: 03/24/2022 12:05    Pertinent labs & imaging results that were available during my care of the patient were reviewed by me and considered in my medical decision making (see MDM for details).  Medications Ordered in ED Medications  propofol (DIPRIVAN) 10 mg/mL bolus/IV push 30 mg ( Intravenous Canceled Entry 03/24/22 1619)  oxyCODONE-acetaminophen (PERCOCET/ROXICET) 5-325 MG per tablet 1 tablet (1 tablet Oral Given 03/24/22 1353)  0.9 %  sodium chloride infusion (0 mLs Intravenous Stopped 03/24/22 1620)  propofol (DIPRIVAN) 10 mg/mL bolus/IV push (40 mg Intravenous Given 03/24/22 1458)                                                                                                                                     Procedures .Sedation  Date/Time: 03/24/2022 5:21 PM  Performed by: Teressa Lower, MD Authorized by: Teressa Lower, MD   Consent:    Consent obtained:  Written   Consent given by:  Patient   Risks discussed:  Allergic reaction, dysrhythmia, inadequate sedation, nausea, prolonged hypoxia resulting in organ damage, prolonged sedation necessitating reversal, respiratory compromise necessitating ventilatory assistance and  intubation and vomiting   Alternatives discussed:  Analgesia without sedation, anxiolysis and regional anesthesia Universal protocol:    Procedure explained and questions answered to patient or proxy's satisfaction: yes     Relevant documents present and verified: yes     Test results available: yes     Imaging studies available: yes     Required blood products, implants, devices, and special equipment available: yes     Site/side marked: yes     Immediately prior to procedure, a time out was called: yes     Patient identity confirmed:  Verbally with patient Indications:    Procedure performed:  Fracture reduction   Procedure necessitating sedation performed by:  Different physician Pre-sedation assessment:    Time since last food or drink:  0800   ASA classification: class 2 - patient with mild systemic disease     Mouth opening:  3 or more finger widths   Thyromental distance:  4 finger widths   Mallampati score:  I - soft palate, uvula, fauces, pillars visible   Neck mobility: normal     Pre-sedation assessments completed and reviewed: airway patency, cardiovascular function, hydration status, mental status, nausea/vomiting, pain level, respiratory function and temperature   Immediate pre-procedure details:    Reassessment: Patient reassessed immediately prior to procedure     Reviewed: vital signs, relevant labs/tests and NPO status     Verified: bag valve mask available, emergency equipment available, intubation equipment available, IV patency confirmed, oxygen available and suction available   Procedure details (see MAR for exact dosages):    Preoxygenation:  Nasal cannula   Sedation:  Propofol   Intended level of sedation: deep   Intra-procedure monitoring:  Blood pressure monitoring, cardiac monitor, continuous pulse oximetry, frequent LOC assessments, frequent vital sign checks and continuous capnometry   Intra-procedure events: none     Total Provider sedation time  (minutes):  20 Post-procedure details:    Attendance: Constant attendance by certified staff until patient recovered     Recovery: Patient returned to pre-procedure baseline     Post-sedation assessments completed and reviewed: airway patency, cardiovascular function, hydration status, mental status, nausea/vomiting, pain level, respiratory function and temperature     Patient is stable for  discharge or admission: yes     Procedure completion:  Tolerated well, no immediate complications Comments:     Patient required a significant amount of propofol to achieve moderate sedation (200), future provider should be aware of this.   (including critical care time)  Medical Decision Making / ED Course   This patient presents to the ED for concern of ankle fracture/dislocation, this involves an extensive number of treatment options, and is a complaint that carries with it a high risk of complications and morbidity.  The differential diagnosis includes fracture, dislocation, compartment syndrome, ligamentous injury, nerve injury  MDM: Patient seen emergency room for evaluation of an ankle fracture dislocation.  Physical exam with tenderness and swelling to the right ankle.  Pulses intact.  Orthopedics was consulted who came to bedside.  Conscious sedation was performed with propofol and the patient required a significant amount of propofol to achieve moderate sedation.  Suspect her history of alcohol use increased tolerance to propofol and future providers should be aware of her need for increased dosing.  The orthopedic physician assistant Hilbert Odor successfully reduced the fracture dislocation at bedside and this was confirmed on follow-up x-ray.  Patient will require admission for surgery today.  Patient then admitted.   Additional history obtained: -Additional history obtained from husband -External records from outside source obtained and reviewed including: Chart review including previous  notes, labs, imaging, consultation notes   Imaging Studies ordered: I ordered imaging studies including x-ray ankle I independently visualized and interpreted imaging. I agree with the radiologist interpretation   Medicines ordered and prescription drug management: Meds ordered this encounter  Medications   oxyCODONE-acetaminophen (PERCOCET/ROXICET) 5-325 MG per tablet 1 tablet   propofol (DIPRIVAN) 10 mg/mL bolus/IV push 30 mg   0.9 %  sodium chloride infusion   propofol (DIPRIVAN) 10 mg/mL bolus/IV push   oxyCODONE (ROXICODONE) 5 MG immediate release tablet    Sig: Take 1 tablet (5 mg total) by mouth every 4 (four) hours as needed for severe pain.    Dispense:  12 tablet    Refill:  0   naproxen (NAPROSYN) 375 MG tablet    Sig: Take 1 tablet (375 mg total) by mouth 2 (two) times daily.    Dispense:  20 tablet    Refill:  0    -I have reviewed the patients home medicines and have made adjustments as needed  Critical interventions none  Consultations Obtained: I requested consultation with the orthopedic surgeons,  and discussed lab and imaging findings as well as pertinent plan - they recommend: Admission for surgery   Cardiac Monitoring: The patient was maintained on a cardiac monitor.  I personally viewed and interpreted the cardiac monitored which showed an underlying rhythm of: NSR  Social Determinants of Health:  Factors impacting patients care include: History of significant alcohol use   Reevaluation: After the interventions noted above, I reevaluated the patient and found that they have :improved  Co morbidities that complicate the patient evaluation  Past Medical History:  Diagnosis Date   Abnormal uterine bleeding (AUB) 03/20/2019   Acute lower UTI 05/12/2019   Acute respiratory failure with hypoxia ([place]) 04/10/2018   Anemia    Arthritis    lower back and hips   Dysmenorrhea 03/20/2019   Elevated liver enzymes 11/24/2013   ETOH abuse    Fatty liver  10/03/2019   Fibroid    GERD (gastroesophageal reflux disease)    Heavy menstrual period    Hypertension  Menorrhagia 12/13/2017   SIRS (systemic inflammatory response syndrome) ([place]) 05/12/2019      Dispostion: I considered admission for this patient, and given need for surgery patient was admitted     Final Clinical Impression(s) / ED Diagnoses Final diagnoses:  Closed fracture of right ankle, initial encounter     '@PCDICTATION'$ @    Teressa Lower, MD 03/24/22 1726

## 2022-03-24 NOTE — H&P (Signed)
Judith Drake is an 51 y.o. female.   Chief Complaint: right ankle pain HPI: Judith Drake is a 51 year old patient who injured her right ankle today with a mechanical fall.  Sustained fracture dislocation which was reduced in the emergency department.  Skin intact.  Postreduction radiographs show the fracture to be reduced.  Patient has autistic son at home.  Husband works odd hours and is in and out.  No personal or family history of DVT or pulmonary embolism.  Past Medical History:  Diagnosis Date   Abnormal uterine bleeding (AUB) 03/20/2019   Acute lower UTI 05/12/2019   Acute respiratory failure with hypoxia (HCC) 04/10/2018   Anemia    Arthritis    lower back and hips   Dysmenorrhea 03/20/2019   Elevated liver enzymes 11/24/2013   ETOH abuse    Fatty liver 10/03/2019   Fibroid    GERD (gastroesophageal reflux disease)    Heavy menstrual period    Hypertension    Menorrhagia 12/13/2017   SIRS (systemic inflammatory response syndrome) ([place]) 05/12/2019    Past Surgical History:  Procedure Laterality Date   CYSTOSCOPY N/A 12/19/2019   Procedure: CYSTOSCOPY;  Surgeon: Aletha Halim, MD;  Location: [place];  Service: Gynecology;  Laterality: N/A;   ELBOW SURGERY     1997    TOTAL LAPAROSCOPIC HYSTERECTOMY WITH SALPINGECTOMY Bilateral 12/19/2019   Procedure: TOTAL LAPAROSCOPIC HYSTERECTOMY WITH BILATERAL SALPINGECTOMY;  Surgeon: Aletha Halim, MD;  Location: [place];  Service: Gynecology;  Laterality: Bilateral;   TUBAL LIGATION      Family History  Problem Relation Age of Onset   Cancer Mother 64       Lung    Hyperlipidemia Mother    Hypertension Mother    Stroke Mother    Kidney disease Mother    Diabetes Mother    Heart disease Mother    Depression Mother    Hyperlipidemia Father    Cancer Paternal Grandfather 67       Colon Cancer - died   Autism Son    Breast cancer Neg Hx    Social History:  reports that she has been smoking cigarettes. She has been smoking an average  of .5 packs per day. She has never used smokeless tobacco. She reports current alcohol use. She reports that she does not use drugs.  Allergies:  Allergies  Allergen Reactions   Amoxicillin Nausea And Vomiting    Has patient had a PCN reaction causing immediate rash, facial/tongue/throat swelling, SOB or lightheadedness with hypotension: No Has patient had a PCN reaction causing severe rash involving mucus membranes or skin necrosis: No Has patient had a PCN reaction that required hospitalization: No Has patient had a PCN reaction occurring within the last 10 years: No If all of the above answers are "NO", then may proceed with Cephalosporin use.    Dilaudid [Hydromorphone Hcl] Itching   Morphine And Related Itching    (Not in a hospital admission)   No results found for this or any previous visit (from the past 48 hour(s)). DG Ankle Right Port  Result Date: 03/24/2022 CLINICAL DATA:  Right ankle fracture, status post reduction and casting EXAM: PORTABLE RIGHT ANKLE - 2 VIEW COMPARISON:  Radiograph performed earlier on the same date. FINDINGS: Overlying cast obscures fine osseous detail. Interval improvement of the tibiotalar joint now in near anatomic alignment. Redemonstration of the displaced fracture of the posterior tibia and oblique fracture of the fibula. IMPRESSION: 1. Status post reduction and casting radiographs demonstrate improved  alignment of the tibiotalar joint. 2. Redemonstration of the oblique fracture of the distal fibula and posterior malleolus. Electronically Signed   By: Keane Police D.O.   On: 03/24/2022 15:34   DG Ankle Complete Right  Result Date: 03/24/2022 CLINICAL DATA:  Trauma, fall EXAM: RIGHT ANKLE - COMPLETE 3+ VIEW COMPARISON:  None Available. FINDINGS: There is a oblique fracture in the distal shaft of right fibula. There is approximally 9 mm distraction of the fracture fragments. There is posterior dislocation of talus in relation to distal articular  surface of tibia. There is comminuted fracture in the posterior aspect of distal right tibia. Plantar spur is seen in calcaneus. IMPRESSION: Fracture dislocation of right ankle. There is displaced fracture in the distal shaft of fibula. Electronically Signed   By: Elmer Picker M.D.   On: 03/24/2022 12:07   DG Foot Complete Right  Result Date: 03/24/2022 CLINICAL DATA:  Trauma, fall EXAM: RIGHT FOOT COMPLETE - 3+ VIEW COMPARISON:  None Available. FINDINGS: Single lateral view of right foot shows fracture or dislocation in the right ankle. There is possible fracture in the distal shaft of fibula. There is soft tissue swelling over the dorsum of the foot. Evaluation of bony structures in the foot is limited without the AP and oblique views. Plantar spur is seen in calcaneus. IMPRESSION: Fracture dislocation in right ankle. Electronically Signed   By: Elmer Picker M.D.   On: 03/24/2022 12:05    Review of Systems  Musculoskeletal:  Positive for arthralgias.  All other systems reviewed and are negative.   Blood pressure (!) 158/95, pulse 95, temperature 98.7 F (37.1 C), temperature source Oral, resp. rate 16, SpO2 100 %. Physical Exam Vitals reviewed.  HENT:     Head: Normocephalic.     Nose: Nose normal.     Mouth/Throat:     Mouth: Mucous membranes are moist.  Eyes:     Pupils: Pupils are equal, round, and reactive to light.  Cardiovascular:     Rate and Rhythm: Normal rate.     Pulses: Normal pulses.  Pulmonary:     Effort: Pulmonary effort is normal.  Abdominal:     General: Abdomen is flat.  Musculoskeletal:     Cervical back: Normal range of motion.  Skin:    General: Skin is warm.     Capillary Refill: Capillary refill takes less than 2 seconds.  Neurological:     General: No focal deficit present.     Mental Status: She is alert.  Psychiatric:        Mood and Affect: Mood normal.   Patient denies any other orthopedic complaints other than right ankle pain.  On  examination the right ankle is splinted.  Toes are perfused and sensate and mobile.  Bilateral upper extremities have good range of motion and left lower extremity has good range of motion  Assessment/Plan Impression is right lateral malleolus and posterior malleolar fracture with possible medial malleolar fracture.  Radiographic review does not clearly demonstrate a medial malleolar fracture.  Syndesmosis appears stable but will have to be assessed at the time of surgery.  Plan is open reduction internal fixation tomorrow.  N.p.o. after midnight.  Risk and benefits are discussed with the patient including but limited to infection or vessel damage nonunion malunion as well as potential need for more surgery.  Patient understands and wishes to proceed.  All questions answered  Anderson Malta, MD 03/24/2022, 7:29 PM

## 2022-03-24 NOTE — ED Notes (Signed)
Surgeon rounding at bedside

## 2022-03-24 NOTE — ED Notes (Signed)
Patient is being discharged from the Urgent Care and sent to the Emergency Department via Personal Vehicle with family. Per Windy Carina MD, patient is in need of higher level of care due to Ankle fracture and dislocation. Patient is aware and verbalizes understanding of plan of care.  Vitals:   03/24/22 1112  BP: 123/77  Pulse: 81  Resp: 18  Temp: 98.6 F (37 C)  SpO2: 94%

## 2022-03-24 NOTE — Progress Notes (Signed)
Orthopedic Tech Progress Note Patient Details:  Judith Drake [birth year] [id number]  ED PA called letting me know that they were getting ready to do a reduction of the ankle and asked if I could come and apply a splint afterwards.   Ortho Devices Type of Ortho Device: Cotton web roll, Ace wrap, Stirrup splint, Short leg splint Ortho Device/Splint Location: RLE Ortho Device/Splint Interventions: Application, Adjustment   Post Interventions Patient Tolerated: Well, Fair Instructions Provided: Care of device  [name] 03/24/2022, 3:30 PM

## 2022-03-24 NOTE — Discharge Instructions (Addendum)
You have a fracture and dislocation of your right ankle    please proceed to the [facility] emergency room, and you will be evaluated by Silvestre Gunner there

## 2022-03-24 NOTE — ED Provider Notes (Signed)
[place]    CSN: [id number] Arrival date & time: 03/24/22  0034      History   Chief Complaint Chief Complaint  Patient presents with   Ankle Injury    HPI Judith Drake is a 51 y.o. female.    Ankle Injury   Here for right foot and ankle pain.  She fell last night about 9 PM and now it is about 1130 the next day.  She missed a step and fell forward and onto her right foot and ankle that twisted up underneath her.  Now she has severe pain on her medial ankle and anterior ankle and dorsum of her foot.  She feels that her foot is disconnected from her ankle and that it has a bone pop but when she tries to roll it over  Past medical history significant for autoimmune arthritis for which she has been taking prednisone and now hydroxychloroquine  Past Medical History:  Diagnosis Date   Abnormal uterine bleeding (AUB) 03/20/2019   Acute lower UTI 05/12/2019   Acute respiratory failure with hypoxia ([place]) 04/10/2018   Anemia    Arthritis    lower back and hips   Dysmenorrhea 03/20/2019   Elevated liver enzymes 11/24/2013   ETOH abuse    Fatty liver 10/03/2019   Fibroid    GERD (gastroesophageal reflux disease)    Heavy menstrual period    Hypertension    Menorrhagia 12/13/2017   SIRS (systemic inflammatory response syndrome) ([place]) 05/12/2019    Patient Active Problem List   Diagnosis Date Noted   Vitamin D deficiency 01/21/2022   HLD (hyperlipidemia) 01/21/2022   TIA (transient ischemic attack) 01/16/2022   OAB (overactive bladder) 01/14/2022   Hand pain 07/19/2020   Dysplasia of cervix, 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   Status post hysterectomy 12/19/2019   Elevated transaminase level 12/18/2019   Hypomagnesemia    Nonallopathic lesion of sacral region 03/28/2018   Nonallopathic lesion of thoracic region 03/28/2018   Nonallopathic lesion of lumbosacral region 03/28/2018   Nonallopathic lesion of pelvic region 03/28/2018   Nonallopathic lesion of cervical  region 03/28/2018   Enlarged thyroid 03/07/2018   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   Lumbar radiculopathy, right 02/08/2018   Anxiety and depression 01/03/2018   Rash 01/03/2018   Right hip pain 01/03/2018   GERD (gastroesophageal reflux disease) 12/13/2017   Hypokalemia 12/13/2017   Depression 11/24/2013   Fatigue 07/12/2013   HTN (hypertension) 07/12/2013   Alcohol abuse 07/12/2013   Tobacco abuse 07/12/2013   Yeast infection of the skin 07/12/2013   Routine general medical examination at a health care facility 07/12/2013    Past Surgical History:  Procedure Laterality Date   CYSTOSCOPY N/A 12/19/2019   Procedure: CYSTOSCOPY;  Surgeon: Aletha Halim, MD;  Location: [facility];  Service: Gynecology;  Laterality: N/A;   ELBOW SURGERY     1997    TOTAL LAPAROSCOPIC HYSTERECTOMY WITH SALPINGECTOMY Bilateral 12/19/2019   Procedure: TOTAL LAPAROSCOPIC HYSTERECTOMY WITH BILATERAL SALPINGECTOMY;  Surgeon: Aletha Halim, MD;  Location: [facility];  Service: Gynecology;  Laterality: Bilateral;   TUBAL LIGATION      OB History     Gravida  2   Para  2   Term  2   Preterm      AB      Living  2      SAB      IAB      Ectopic      Multiple  Live Births  2        Obstetric Comments  Vaginal x 2          Home Medications    Prior to Admission medications   Medication Sig Start Date End Date Taking? Authorizing Provider  amLODipine (NORVASC) 10 MG tablet TAKE 1 TABLET BY MOUTH ONCE DAILY 03/06/22   Burnard Hawthorne, FNP  Aspirin-Caffeine [phone] MG PACK Take 1 Package by mouth daily as needed (pain).    [provider]  Cholecalciferol 1.25 MG (50000 UT) TABS 50,000 units PO qwk for 8 weeks. 01/21/22   Burnard Hawthorne, FNP  escitalopram (LEXAPRO) 10 MG tablet Take 1 tablet (10 mg total) by mouth daily. 01/21/22   Burnard Hawthorne, FNP    Family History Family History  Problem Relation Age of Onset   Cancer Mother 54       Lung     Hyperlipidemia Mother    Hypertension Mother    Stroke Mother    Kidney disease Mother    Diabetes Mother    Heart disease Mother    Depression Mother    Hyperlipidemia Father    Cancer Paternal Grandfather 65       Colon Cancer - died   Autism Son    Breast cancer Neg Hx     Social History Social History   Tobacco Use   Smoking status: Every Day    Packs/day: 0.50    Types: Cigarettes   Smokeless tobacco: Never  Vaping Use   Vaping Use: Never used  Substance Use Topics   Alcohol use: Yes    Comment: weekly   Drug use: No     Allergies   Amoxicillin, Dilaudid [hydromorphone hcl], and Morphine and related   Review of Systems Review of Systems   Physical Exam Triage Vital Signs ED Triage Vitals  Enc Vitals Group     BP 03/24/22 1112 123/77     Pulse Rate 03/24/22 1112 81     Resp 03/24/22 1112 18     Temp 03/24/22 1112 98.6 F (37 C)     Temp Source 03/24/22 1112 Oral     SpO2 03/24/22 1112 94 %     Weight --      Height --      Head Circumference --      Peak Flow --      Pain Score 03/24/22 1113 10     Pain Loc --      Pain Edu? --      Excl. in [place]? --    No data found.  Updated Vital Signs BP 123/77 (BP Location: Left Arm)   Pulse 81   Temp 98.6 F (37 C) (Oral)   Resp 18   LMP  (LMP Unknown)   SpO2 94%   Visual Acuity Right Eye Distance:   Left Eye Distance:   Bilateral Distance:    Right Eye Near:   Left Eye Near:    Bilateral Near:     Physical Exam Constitutional:      Appearance: She is not toxic-appearing.     Comments: In obvious discomfort  Musculoskeletal:     Comments: There is a lot of swelling and ecchymosis of the dorsum of her right foot and especially her medial and anterior ankle.  It is tender to touch and range of motion is limited severely by pain  Skin:    Coloration: Skin is not pale.  Neurological:     Mental  Status: She is alert and oriented to person, place, and time.      UC Treatments / Results   Labs (all labs ordered are listed, but only abnormal results are displayed) Labs Reviewed - No data to display  EKG   Radiology No results found.  Procedures Procedures (including critical care time)  Medications Ordered in UC Medications  ketorolac (TORADOL) 30 MG/ML injection 30 mg (has no administration in time range)    Initial Impression / Assessment and Plan / UC Course  I have reviewed the triage vital signs and the nursing notes.  Pertinent labs & imaging results that were available during my care of the patient were reviewed by me and considered in my medical decision making (see chart for details).    X-ray shows right ankle dislocation and fracture of the fibula and tibia.  I spoke with Ortho PA on-call and he will see her in the emergency room for reduction under sedation.  They will go by private car Final Clinical Impressions(s) / UC Diagnoses   Final diagnoses:  Closed fracture of right ankle, initial encounter  Dislocation of right ankle joint, initial encounter     Discharge Instructions      You have a fracture and dislocation of your right ankle    please proceed to the [facility] emergency room, and you will be evaluated by Silvestre Gunner there     ED Prescriptions   None    PDMP not reviewed this encounter.   Barrett Henle, MD 03/24/22 [phone]

## 2022-03-24 NOTE — ED Provider Triage Note (Signed)
Emergency Medicine Provider Triage Evaluation Note  Judith Drake , a 51 y.o. female  was evaluated in triage.  Pt complains of right ankle pain. States she fell last night and pain is severe. Was seen at urgent care and was seen to have ankle fracture dislocation on imaging. UC provider called Ortho PA Hilbert Odor who will see her for reduction under sedation.   Review of Systems  Positive:  Negative:   Physical Exam  BP 115/77 (BP Location: Left Arm)   Pulse (!) 101   Temp 98.7 F (37.1 C) (Oral)   Resp 18   LMP  (LMP Unknown)   SpO2 99%  Gen:   Awake, no distress   Resp:  Normal effort  MSK:   Moves extremities without difficulty  Other:  Significant swelling noted to the right foot and ankle. DP and PT pulses intact, distal sensation intact  Medical Decision Making  Medically screening exam initiated at 1:27 PM.  Appropriate orders placed.  [name] was informed that the remainder of the evaluation will be completed by another provider, this initial triage assessment does not replace that evaluation, and the importance of remaining in the ED until their evaluation is complete.     Bud Face, PA-C 03/24/22 1330

## 2022-03-25 ENCOUNTER — Observation Stay (HOSPITAL_COMMUNITY): Payer: BC Managed Care – PPO

## 2022-03-25 ENCOUNTER — Other Ambulatory Visit: Payer: Self-pay

## 2022-03-25 ENCOUNTER — Encounter (HOSPITAL_COMMUNITY)
Admission: EM | Disposition: A | Discharge status: Home / Self Care | Payer: Self-pay | Source: Home / Self Care | Attending: Student

## 2022-03-25 ENCOUNTER — Encounter (HOSPITAL_COMMUNITY): Payer: Self-pay | Admitting: Orthopedic Surgery

## 2022-03-25 ENCOUNTER — Observation Stay (HOSPITAL_COMMUNITY): Payer: BC Managed Care – PPO | Admitting: Certified Registered Nurse Anesthetist

## 2022-03-25 DIAGNOSIS — S8254XA Nondisplaced fracture of medial malleolus of right tibia, initial encounter for closed fracture: Secondary | ICD-10-CM

## 2022-03-25 DIAGNOSIS — F17200 Nicotine dependence, unspecified, uncomplicated: Secondary | ICD-10-CM | POA: Diagnosis not present

## 2022-03-25 DIAGNOSIS — S8264XA Nondisplaced fracture of lateral malleolus of right fibula, initial encounter for closed fracture: Secondary | ICD-10-CM | POA: Diagnosis not present

## 2022-03-25 DIAGNOSIS — S82841A Displaced bimalleolar fracture of right lower leg, initial encounter for closed fracture: Secondary | ICD-10-CM | POA: Diagnosis not present

## 2022-03-25 DIAGNOSIS — Z01818 Encounter for other preprocedural examination: Secondary | ICD-10-CM | POA: Diagnosis not present

## 2022-03-25 DIAGNOSIS — G8918 Other acute postprocedural pain: Secondary | ICD-10-CM | POA: Diagnosis not present

## 2022-03-25 DIAGNOSIS — S82831A Other fracture of upper and lower end of right fibula, initial encounter for closed fracture: Secondary | ICD-10-CM | POA: Diagnosis not present

## 2022-03-25 DIAGNOSIS — S8291XA Unspecified fracture of right lower leg, initial encounter for closed fracture: Secondary | ICD-10-CM | POA: Diagnosis not present

## 2022-03-25 DIAGNOSIS — I10 Essential (primary) hypertension: Secondary | ICD-10-CM | POA: Diagnosis not present

## 2022-03-25 HISTORY — PX: ORIF ANKLE FRACTURE: SHX5408

## 2022-03-25 LAB — BASIC METABOLIC PANEL
Anion gap: 10 (ref 5–15)
BUN: <5 mg/dL — ABNORMAL LOW (ref 6–20)
CO2: 29 mmol/L (ref 22–32)
Calcium: 9.1 mg/dL (ref 8.9–10.3)
Chloride: 102 mmol/L (ref 98–111)
Creatinine, Ser: 0.61 mg/dL (ref 0.44–1.00)
GFR, Estimated: >60 mL/min (ref >60)
Glucose, Bld: 105 mg/dL — ABNORMAL HIGH (ref 70–99)
Potassium: 3.4 mmol/L — ABNORMAL LOW (ref 3.5–5.1)
Sodium: 141 mmol/L (ref 135–145)

## 2022-03-25 LAB — CBC
HCT: 34.6 % — ABNORMAL LOW (ref 36.0–46.0)
Hemoglobin: 11.6 g/dL — ABNORMAL LOW (ref 12.0–15.0)
MCH: 31 pg (ref 26.0–34.0)
MCHC: 33.5 g/dL (ref 30.0–36.0)
MCV: 92.5 fL (ref 80.0–100.0)
Platelets: 250 10*3/uL (ref 150–400)
RBC: 3.74 MIL/uL — ABNORMAL LOW (ref 3.87–5.11)
RDW: 12.7 % (ref 11.5–15.5)
WBC: 4.7 10*3/uL (ref 4.0–10.5)
nRBC: 0 % (ref 0.0–0.2)

## 2022-03-25 LAB — SURGICAL PCR SCREEN
MRSA, PCR: NEGATIVE
Staphylococcus aureus: NEGATIVE

## 2022-03-25 SURGERY — OPEN REDUCTION INTERNAL FIXATION (ORIF) ANKLE FRACTURE
Anesthesia: Regional | Site: Ankle | Laterality: Right

## 2022-03-25 MED ORDER — MIDAZOLAM HCL 2 MG/2ML IJ SOLN
INTRAMUSCULAR | Status: AC
  Administered 2022-03-25: 2 mg via INTRAVENOUS
  Filled 2022-03-25: qty 2

## 2022-03-25 MED ORDER — CELECOXIB 100 MG PO CAPS
100.0000 mg | ORAL_CAPSULE | Freq: Two times a day (BID) | ORAL | Status: DC
  Administered 2022-03-25 – 2022-03-27 (×4): 100 mg via ORAL
  Filled 2022-03-25 (×5): qty 1

## 2022-03-25 MED ORDER — ONDANSETRON HCL 4 MG/2ML IJ SOLN
INTRAMUSCULAR | Status: DC | PRN
  Administered 2022-03-25: 4 mg via INTRAVENOUS

## 2022-03-25 MED ORDER — HYDROMORPHONE HCL 1 MG/ML IJ SOLN
0.5000 mg | INTRAMUSCULAR | Status: DC | PRN
  Administered 2022-03-26 – 2022-03-27 (×2): 0.5 mg via INTRAVENOUS
  Filled 2022-03-25 (×2): qty 0.5

## 2022-03-25 MED ORDER — DEXAMETHASONE SODIUM PHOSPHATE 10 MG/ML IJ SOLN
INTRAMUSCULAR | Status: DC | PRN
  Administered 2022-03-25: 10 mg via INTRAVENOUS

## 2022-03-25 MED ORDER — ACETAMINOPHEN 325 MG PO TABS: 325.0000 mg | ORAL_TABLET | Freq: Four times a day (QID) | ORAL | Status: DC | PRN

## 2022-03-25 MED ORDER — POVIDONE-IODINE 10 % EX SWAB: 2.0000 | Freq: Once | CUTANEOUS | Status: DC

## 2022-03-25 MED ORDER — METOCLOPRAMIDE HCL 5 MG PO TABS: 5.0000 mg | ORAL_TABLET | Freq: Three times a day (TID) | ORAL | Status: DC | PRN

## 2022-03-25 MED ORDER — ONDANSETRON HCL 4 MG/2ML IJ SOLN: 4.0000 mg | Freq: Four times a day (QID) | INTRAMUSCULAR | Status: DC | PRN

## 2022-03-25 MED ORDER — BUPIVACAINE HCL (PF) 0.5 % IJ SOLN
INTRAMUSCULAR | Status: DC | PRN
  Administered 2022-03-25: 40 mL via PERINEURAL

## 2022-03-25 MED ORDER — ACETAMINOPHEN 500 MG PO TABS
1000.0000 mg | ORAL_TABLET | Freq: Four times a day (QID) | ORAL | Status: AC
  Administered 2022-03-25 – 2022-03-26 (×3): 1000 mg via ORAL
  Filled 2022-03-25 (×4): qty 2

## 2022-03-25 MED ORDER — ROCURONIUM BROMIDE 10 MG/ML (PF) SYRINGE
PREFILLED_SYRINGE | INTRAVENOUS | Status: AC
Start: 2022-03-25 — End: ?
  Filled 2022-03-25: qty 20

## 2022-03-25 MED ORDER — TRANEXAMIC ACID-NACL 1000-0.7 MG/100ML-% IV SOLN: 1000.0000 mg | INTRAVENOUS | Status: DC

## 2022-03-25 MED ORDER — FENTANYL CITRATE (PF) 250 MCG/5ML IJ SOLN
INTRAMUSCULAR | Status: DC | PRN
  Administered 2022-03-25: 50 ug via INTRAVENOUS

## 2022-03-25 MED ORDER — HYDROMORPHONE HCL 1 MG/ML IJ SOLN: 0.5000 mg | INTRAMUSCULAR | Status: DC | PRN

## 2022-03-25 MED ORDER — CEFAZOLIN SODIUM-DEXTROSE 1-4 GM/50ML-% IV SOLN
1.0000 g | Freq: Three times a day (TID) | INTRAVENOUS | Status: AC
  Administered 2022-03-26 (×3): 1 g via INTRAVENOUS
  Filled 2022-03-25 (×3): qty 50

## 2022-03-25 MED ORDER — OXYCODONE HCL 5 MG PO TABS
5.0000 mg | ORAL_TABLET | ORAL | Status: DC | PRN
  Administered 2022-03-26 – 2022-03-27 (×3): 10 mg via ORAL
  Filled 2022-03-25 (×3): qty 2

## 2022-03-25 MED ORDER — TRANEXAMIC ACID-NACL 1000-0.7 MG/100ML-% IV SOLN
INTRAVENOUS | Status: DC | PRN
  Administered 2022-03-25: 1000 mg via INTRAVENOUS

## 2022-03-25 MED ORDER — CHLORHEXIDINE GLUCONATE 4 % EX LIQD
60.0000 mL | Freq: Once | CUTANEOUS | Status: AC
  Administered 2022-03-25: 4 via TOPICAL
  Filled 2022-03-25: qty 15
  Filled 2022-03-25: qty 60

## 2022-03-25 MED ORDER — METHOCARBAMOL 500 MG PO TABS
500.0000 mg | ORAL_TABLET | Freq: Four times a day (QID) | ORAL | Status: DC | PRN
Start: 2022-03-25 — End: 2022-03-25

## 2022-03-25 MED ORDER — ASPIRIN 81 MG PO CHEW
81.0000 mg | CHEWABLE_TABLET | Freq: Two times a day (BID) | ORAL | Status: DC
  Administered 2022-03-25 – 2022-03-27 (×4): 81 mg via ORAL
  Filled 2022-03-25 (×4): qty 1

## 2022-03-25 MED ORDER — CEFAZOLIN SODIUM-DEXTROSE 2-4 GM/100ML-% IV SOLN
INTRAVENOUS | Status: AC
  Filled 2022-03-25: qty 100

## 2022-03-25 MED ORDER — LIDOCAINE 2% (20 MG/ML) 5 ML SYRINGE
INTRAMUSCULAR | Status: DC | PRN
  Administered 2022-03-25: 60 mg via INTRAVENOUS

## 2022-03-25 MED ORDER — VANCOMYCIN HCL 1000 MG IV SOLR
INTRAVENOUS | Status: DC | PRN
  Administered 2022-03-25: 1000 mg via TOPICAL

## 2022-03-25 MED ORDER — LACTATED RINGERS IV SOLN: INTRAVENOUS | Status: DC

## 2022-03-25 MED ORDER — ONDANSETRON HCL 4 MG/2ML IJ SOLN
INTRAMUSCULAR | Status: AC
Start: 2022-03-25 — End: ?
  Filled 2022-03-25: qty 2

## 2022-03-25 MED ORDER — FENTANYL CITRATE (PF) 250 MCG/5ML IJ SOLN
INTRAMUSCULAR | Status: AC
  Filled 2022-03-25: qty 5

## 2022-03-25 MED ORDER — PHENYLEPHRINE HCL-NACL 20-0.9 MG/250ML-% IV SOLN
INTRAVENOUS | Status: DC | PRN
  Administered 2022-03-25: 20 ug/min via INTRAVENOUS

## 2022-03-25 MED ORDER — CEFAZOLIN SODIUM-DEXTROSE 2-3 GM-%(50ML) IV SOLR
INTRAVENOUS | Status: DC | PRN
  Administered 2022-03-25: 2 g via INTRAVENOUS

## 2022-03-25 MED ORDER — FENTANYL CITRATE (PF) 100 MCG/2ML IJ SOLN: 100.0000 ug | Freq: Once | INTRAMUSCULAR | Status: AC

## 2022-03-25 MED ORDER — DEXAMETHASONE SODIUM PHOSPHATE 10 MG/ML IJ SOLN
INTRAMUSCULAR | Status: AC
Start: 2022-03-25 — End: ?
  Filled 2022-03-25: qty 1

## 2022-03-25 MED ORDER — CHLORHEXIDINE GLUCONATE 0.12 % MT SOLN
OROMUCOSAL | Status: AC
  Filled 2022-03-25: qty 15

## 2022-03-25 MED ORDER — TRANEXAMIC ACID-NACL 1000-0.7 MG/100ML-% IV SOLN
INTRAVENOUS | Status: AC
  Filled 2022-03-25: qty 100

## 2022-03-25 MED ORDER — VANCOMYCIN HCL 1000 MG IV SOLR
INTRAVENOUS | Status: AC
  Filled 2022-03-25: qty 20

## 2022-03-25 MED ORDER — ORAL CARE MOUTH RINSE: 15.0000 mL | Freq: Once | OROMUCOSAL | Status: DC

## 2022-03-25 MED ORDER — MIDAZOLAM HCL 2 MG/2ML IJ SOLN: 2.0000 mg | Freq: Once | INTRAMUSCULAR | Status: AC

## 2022-03-25 MED ORDER — CHLORHEXIDINE GLUCONATE 0.12 % MT SOLN: 15.0000 mL | Freq: Once | OROMUCOSAL | Status: DC

## 2022-03-25 MED ORDER — FENTANYL CITRATE (PF) 100 MCG/2ML IJ SOLN
INTRAMUSCULAR | Status: AC
  Administered 2022-03-25: 100 ug via INTRAVENOUS
  Filled 2022-03-25: qty 2

## 2022-03-25 MED ORDER — METHOCARBAMOL 1000 MG/10ML IJ SOLN
500.0000 mg | Freq: Four times a day (QID) | INTRAMUSCULAR | Status: DC | PRN
Start: 2022-03-25 — End: 2022-03-25

## 2022-03-25 MED ORDER — LIDOCAINE 2% (20 MG/ML) 5 ML SYRINGE
INTRAMUSCULAR | Status: AC
Start: 2022-03-25 — End: ?
  Filled 2022-03-25: qty 5

## 2022-03-25 MED ORDER — PROPOFOL 10 MG/ML IV BOLUS
INTRAVENOUS | Status: DC | PRN
  Administered 2022-03-25: 170 mg via INTRAVENOUS

## 2022-03-25 MED ORDER — CHLORHEXIDINE GLUCONATE 0.12 % MT SOLN
OROMUCOSAL | Status: AC
  Administered 2022-03-25: 15 mL
  Filled 2022-03-25: qty 15

## 2022-03-25 MED ORDER — FENTANYL CITRATE (PF) 100 MCG/2ML IJ SOLN: 25.0000 ug | INTRAMUSCULAR | Status: DC | PRN

## 2022-03-25 MED ORDER — SODIUM CHLORIDE 0.9 % IV SOLN: INTRAVENOUS | Status: AC

## 2022-03-25 MED ORDER — CLONIDINE HCL (ANALGESIA) 100 MCG/ML EP SOLN
EPIDURAL | Status: DC | PRN
  Administered 2022-03-25: 100 ug

## 2022-03-25 MED ORDER — CEFAZOLIN SODIUM-DEXTROSE 2-4 GM/100ML-% IV SOLN: 2.0000 g | INTRAVENOUS | Status: DC

## 2022-03-25 MED ORDER — METOCLOPRAMIDE HCL 5 MG/ML IJ SOLN: 5.0000 mg | Freq: Three times a day (TID) | INTRAMUSCULAR | Status: DC | PRN

## 2022-03-25 MED ORDER — PHENYLEPHRINE 80 MCG/ML (10ML) SYRINGE FOR IV PUSH (FOR BLOOD PRESSURE SUPPORT)
PREFILLED_SYRINGE | INTRAVENOUS | Status: AC
  Filled 2022-03-25: qty 20

## 2022-03-25 MED ORDER — DOCUSATE SODIUM 100 MG PO CAPS
100.0000 mg | ORAL_CAPSULE | Freq: Two times a day (BID) | ORAL | Status: DC
  Administered 2022-03-25 – 2022-03-27 (×3): 100 mg via ORAL
  Filled 2022-03-25 (×4): qty 1

## 2022-03-25 MED ORDER — 0.9 % SODIUM CHLORIDE (POUR BTL) OPTIME
TOPICAL | Status: DC | PRN
  Administered 2022-03-25: 1000 mL

## 2022-03-25 MED ORDER — ONDANSETRON HCL 4 MG PO TABS: 4.0000 mg | ORAL_TABLET | Freq: Four times a day (QID) | ORAL | Status: DC | PRN

## 2022-03-25 SURGICAL SUPPLY — 90 items
BAG COUNTER SPONGE SURGICOUNT (BAG) ×3 IMPLANT
BAG SPNG CNTER NS LX DISP (BAG) ×1
BIT DRILL OVR 3.5AO QC SHRT SM (DRILL) IMPLANT
BIT DRILL QC 2.0 SHORT EVOS SM (DRILL) IMPLANT
BIT DRILL QC 2.5MM SHRT EVO SM (DRILL) IMPLANT
BLADE SURG 10 STRL SS (BLADE) IMPLANT
BNDG CMPR 9X4 STRL LF SNTH (GAUZE/BANDAGES/DRESSINGS) ×1
BNDG CMPR MED 10X6 ELC LF (GAUZE/BANDAGES/DRESSINGS) ×1
BNDG COHESIVE 6X5 TAN STRL LF (GAUZE/BANDAGES/DRESSINGS) IMPLANT
BNDG ELASTIC 3X5.8 VLCR STR LF (GAUZE/BANDAGES/DRESSINGS) ×1 IMPLANT
BNDG ELASTIC 4X5.8 VLCR STR LF (GAUZE/BANDAGES/DRESSINGS) ×3 IMPLANT
BNDG ELASTIC 6X10 VLCR STRL LF (GAUZE/BANDAGES/DRESSINGS) ×3 IMPLANT
BNDG ESMARK 4X9 LF (GAUZE/BANDAGES/DRESSINGS) ×3 IMPLANT
BNDG GAUZE ELAST 4 BULKY (GAUZE/BANDAGES/DRESSINGS) ×3 IMPLANT
COVER MAYO STAND STRL (DRAPES) IMPLANT
COVER SURGICAL LIGHT HANDLE (MISCELLANEOUS) ×4 IMPLANT
CUFF TOURN SGL QUICK 34 (TOURNIQUET CUFF)
CUFF TOURN SGL QUICK 42 (TOURNIQUET CUFF) IMPLANT
CUFF TRNQT CYL 34X4.125X (TOURNIQUET CUFF) IMPLANT
DRAPE C-ARM 42X72 X-RAY (DRAPES) ×3 IMPLANT
DRAPE INCISE IOBAN 66X45 STRL (DRAPES) IMPLANT
DRAPE SURG 17X23 STRL (DRAPES) ×3 IMPLANT
DRAPE U-SHAPE 47X51 STRL (DRAPES) ×3 IMPLANT
DRILL OVER 3.5 AO QC SHORT SM (DRILL) ×2
DRILL QC 2.0 SHORT EVOS SM (DRILL) ×2
DRILL QC 2.5MM SHORT EVOS SM (DRILL) ×2
DRSG AQUACEL AG ADV 3.5X10 (GAUZE/BANDAGES/DRESSINGS) ×1 IMPLANT
DRSG PAD ABDOMINAL 8X10 ST (GAUZE/BANDAGES/DRESSINGS) ×3 IMPLANT
DURAPREP 26ML APPLICATOR (WOUND CARE) IMPLANT
ELECT CAUTERY BLADE 6.4 (BLADE) ×3 IMPLANT
ELECT REM PT RETURN 9FT ADLT (ELECTROSURGICAL) ×2
ELECTRODE REM PT RTRN 9FT ADLT (ELECTROSURGICAL) ×2 IMPLANT
GAUZE SPONGE 4X4 12PLY STRL (GAUZE/BANDAGES/DRESSINGS) ×3 IMPLANT
GAUZE SPONGE 4X4 12PLY STRL LF (GAUZE/BANDAGES/DRESSINGS) ×1 IMPLANT
GAUZE XEROFORM 5X9 LF (GAUZE/BANDAGES/DRESSINGS) ×3 IMPLANT
GLOVE BIOGEL PI IND STRL 7.0 (GLOVE) ×2 IMPLANT
GLOVE BIOGEL PI IND STRL 8 (GLOVE) ×2 IMPLANT
GLOVE BIOGEL PI INDICATOR 7.0 (GLOVE) ×1
GLOVE BIOGEL PI INDICATOR 8 (GLOVE) ×1
GLOVE ECLIPSE 7.0 STRL STRAW (GLOVE) ×3 IMPLANT
GLOVE ECLIPSE 8.0 STRL XLNG CF (GLOVE) ×3 IMPLANT
GOWN STRL REUS W/ TWL LRG LVL3 (GOWN DISPOSABLE) ×6 IMPLANT
GOWN STRL REUS W/TWL LRG LVL3 (GOWN DISPOSABLE) ×6
HANDPIECE INTERPULSE COAX TIP (DISPOSABLE)
KIT BASIN OR (CUSTOM PROCEDURE TRAY) ×3 IMPLANT
KIT TURNOVER KIT B (KITS) ×3 IMPLANT
MANIFOLD NEPTUNE II (INSTRUMENTS) ×3 IMPLANT
NDL HYPO 25GX1X1/2 BEV (NEEDLE) ×2 IMPLANT
NEEDLE HYPO 25GX1X1/2 BEV (NEEDLE) ×2 IMPLANT
NS IRRIG 1000ML POUR BTL (IV SOLUTION) ×3 IMPLANT
PACK ORTHO EXTREMITY (CUSTOM PROCEDURE TRAY) ×3 IMPLANT
PAD ABD 7.5X8 STRL (GAUZE/BANDAGES/DRESSINGS) ×1 IMPLANT
PAD ABD 8X10 STRL (GAUZE/BANDAGES/DRESSINGS) ×1 IMPLANT
PAD ARMBOARD 7.5X6 YLW CONV (MISCELLANEOUS) ×6 IMPLANT
PAD CAST 3X4 CTTN HI CHSV (CAST SUPPLIES) IMPLANT
PAD CAST 4YDX4 CTTN HI CHSV (CAST SUPPLIES) ×4 IMPLANT
PADDING CAST COTTON 3X4 STRL (CAST SUPPLIES) ×4
PADDING CAST COTTON 4X4 STRL (CAST SUPPLIES) ×4
PLATE FIB EVOS 2.7/3.5 7H R103 (Plate) ×1 IMPLANT
SCREW CORT 2.7X12 EVOS (Screw) IMPLANT
SCREW CORT 2.7X15 T8 ST EVOS (Screw) ×1 IMPLANT
SCREW CORT 3.5X10MM ST EVOS (Screw) ×1 IMPLANT
SCREW CORT 3.5X11 ST EVOS (Screw) ×2 IMPLANT
SCREW CORT 3.5X14 ST EVOS (Screw) ×1 IMPLANT
SCREW CORT 3.5X16 ST EVOS (Screw) ×1 IMPLANT
SCREW CORT EVOS ST 3.5X12 (Screw) ×1 IMPLANT
SCREW CORT EVOS ST T8 2.7X14MM (Screw) ×2 IMPLANT
SCREW CORT VA EVOS 2.7X24 (Screw) ×1 IMPLANT
SCREW CORTEX 3.5X18 EVOS (Screw) ×1 IMPLANT
SCREW EVOS 2.7X11 LOCK T8 (Screw) ×1 IMPLANT
SCREW LOCK 2.7X13 ST EVOS (Screw) ×1 IMPLANT
SCREW LOCK 2.7X8 (Screw) ×2 IMPLANT
SCREW LOCK T8 8X2.7XST (Screw) IMPLANT
SET HNDPC FAN SPRY TIP SCT (DISPOSABLE) IMPLANT
SPLINT PLASTER CAST XFAST 5X30 (CAST SUPPLIES) IMPLANT
SPLINT PLASTER XFAST SET 5X30 (CAST SUPPLIES) ×1
STOCKINETTE IMPERVIOUS 9X36 MD (GAUZE/BANDAGES/DRESSINGS) IMPLANT
SUCTION FRAZIER HANDLE 10FR (MISCELLANEOUS) ×2
SUCTION TUBE FRAZIER 10FR DISP (MISCELLANEOUS) ×2 IMPLANT
SUT ETHILON 3 0 PS 1 (SUTURE) IMPLANT
SUT MNCRL AB 3-0 PS2 18 (SUTURE) IMPLANT
SUT VIC AB 2-0 CTB1 (SUTURE) ×3 IMPLANT
SUT VIC AB 3-0 SH 27 (SUTURE) ×2
SUT VIC AB 3-0 SH 27X BRD (SUTURE) ×2 IMPLANT
SYR CONTROL 10ML LL (SYRINGE) ×3 IMPLANT
TOWEL GREEN STERILE (TOWEL DISPOSABLE) ×3 IMPLANT
TOWEL GREEN STERILE FF (TOWEL DISPOSABLE) ×3 IMPLANT
TUBE CONNECTING 12X1/4 (SUCTIONS) ×3 IMPLANT
WATER STERILE IRR 1000ML POUR (IV SOLUTION) ×3 IMPLANT
YANKAUER SUCT BULB TIP NO VENT (SUCTIONS) IMPLANT

## 2022-03-25 NOTE — Plan of Care (Signed)

## 2022-03-25 NOTE — Transfer of Care (Signed)
Immediate Anesthesia Transfer of Care Note  Patient: Judith Drake  Procedure(s) Performed: OPEN REDUCTION INTERNAL FIXATION (ORIF) ANKLE FRACTURE (Right: Ankle)  Patient Location: PACU  Anesthesia Type:GA combined with regional for post-op pain  Level of Consciousness: awake, alert  and oriented  Airway & Oxygen Therapy: Patient Spontanous Breathing and Patient connected to nasal cannula oxygen  Post-op Assessment: Report given to RN, Post -op Vital signs reviewed and stable and Patient moving all extremities  Post vital signs: Reviewed and stable  Last Vitals:  Vitals Value Taken Time  BP 127/79 03/25/22 1946  Temp    Pulse 88 03/25/22 1953  Resp 10 03/25/22 1953  SpO2 93 % 03/25/22 1953  Vitals shown include unvalidated device data.  Last Pain:  Vitals:   03/25/22 1629  TempSrc: Oral  PainSc:          Complications: No notable events documented.

## 2022-03-25 NOTE — Anesthesia Postprocedure Evaluation (Signed)
Anesthesia Post Note  Patient: Judith Drake  Procedure(s) Performed: OPEN REDUCTION INTERNAL FIXATION (ORIF) ANKLE FRACTURE (Right: Ankle)     Patient location during evaluation: PACU Anesthesia Type: Regional and General Level of consciousness: patient cooperative, oriented and sedated Pain management: pain level controlled Vital Signs Assessment: post-procedure vital signs reviewed and stable Respiratory status: spontaneous breathing, nonlabored ventilation and respiratory function stable Cardiovascular status: blood pressure returned to baseline and stable Postop Assessment: no apparent nausea or vomiting Anesthetic complications: no   No notable events documented.  Last Vitals:  Vitals:   03/25/22 2030 03/25/22 2102  BP: 123/80 126/77  Pulse: 85 87  Resp: 15 18  Temp: 37.2 C 36.8 C  SpO2: 96% 94%    Last Pain:  Vitals:   03/25/22 2102  TempSrc: Oral  PainSc:                  Rita Prom,E. Derron Pipkins

## 2022-03-25 NOTE — Anesthesia Procedure Notes (Signed)
Anesthesia Regional Block: Popliteal block   Pre-Anesthetic Checklist: , timeout performed,  Correct Patient, Correct Site, Correct Laterality,  Correct Procedure, Correct Position, site marked,  Risks and benefits discussed,  Surgical consent,  Pre-op evaluation,  At surgeon's request and post-op pain management  Laterality: Right  Prep: Dura Prep       Needles:  Injection technique: Single-shot  Needle Type: Echogenic Stimulator Needle     Needle Length: 10cm  Needle Gauge: 20     Additional Needles:   Procedures:,,,, ultrasound used (permanent image in chart),,    Narrative:  Start time: 03/25/2022 4:50 PM End time: 03/25/2022 4:52 PM Injection made incrementally with aspirations every 5 mL.  Performed by: Personally  Anesthesiologist: Darral Dash, DO  Additional Notes: Patient identified. Risks/Benefits/Options discussed with patient including but not limited to bleeding, infection, nerve damage, failed block, incomplete pain control. Patient expressed understanding and wished to proceed. All questions were answered. Sterile technique was used throughout the entire procedure. Please see nursing notes for vital signs. Aspirated in 5cc intervals with injection for negative confirmation. Patient was given instructions on fall risk and not to get out of bed. All questions and concerns addressed with instructions to call with any issues or inadequate analgesia.

## 2022-03-25 NOTE — Op Note (Signed)
:   Judith Drake, LEVEN MEDICAL RECORD NO: [id number] ACCOUNT NO: [id number] DATE OF BIRTH: 07/02/1971 FACILITY: [facility] LOCATION: [id number] PHYSICIAN: Yetta Barre. Marlou Sa, MD  Operative Report   PREOPERATIVE DIAGNOSIS:  Right bimalleolar ankle fracture.  POSTOPERATIVE DIAGNOSIS:  Right bimalleolar ankle fracture.  PROCEDURE:  right bimalleolar ankle fracture open reduction and internal fixation of the lateral malleolus.  SURGEON:  Yetta Barre. Marlou Sa, MD  ASSISTANT:  Annie Main, PA.  INDICATIONS:  The patient is a 51 year old patient who sustained an ankle fracture dislocation.  She has lateral malleolus fracture along with posterior malleolar fracture.  She presents now for operative management after explanation of risks and  benefits.  The medial malleolus was inspected and found to be not fractured.  PROCEDURE IN DETAIL:  The patient was brought to the operating room where general endotracheal anesthesia was induced.  Preoperative antibiotics administered.  Timeout was called.  Right ankle was prescrubbed with alcohol and Betadine, allowed to air  dry.  Prepped with DuraPrep solution and draped in a sterile manner.  Ioban used to cover the operative field.  After calling timeout, leg was elevated, exsanguinated with an Esmarch wrap, which was utilized as a tourniquet.  Lateral approach to the  ankle was made.  Skin and subcutaneous tissue were sharply divided over about an 8 cm incision.  Care was taken to avoid injury to superficial peroneal nerve sensory branches.  Fracture site was identified.  Soft tissue elevation was performed around the  fracture site for visualization.  Fracture was irrigated and reduced using a clamp.  Lag screw placed anterior proximal to posterior distal.  Good fixation was achieved.  Bone quality was good.  Reduction was confirmed in the AP and lateral planes under  fluoroscopy.  A Smith and Nephew plate was then applied with locking screws distally and nonlocking screws  proximally.  Overall, good alignment, length and rotation was restored.  Syndesmosis tested at this time and found to be stable.  No medial  malleolar fracture was present. Posterior malleolar fracture reduced well.  Thorough irrigation was performed.  Tourniquet was released.  Bleeding points encountered controlled using electrocautery.  Vancomycin powder placed.  The incision was then closed using 2-0 Vicryl suture, and 3-0 nylon  suture.  Aquacel dressing placed along with a well-padded posterior splint.  The patient tolerated the procedure well without immediate complication and transferred to the recovery room in stable condition.  Luke's assistance was required at all times  during the case for retraction, opening, closing, screw placement.  His assistance was a medical necessity.   NIK D: 03/25/2022 7:20:40 pm T: 03/25/2022 11:02:00 pm  JOB: [id number]/ [id number]

## 2022-03-25 NOTE — Anesthesia Procedure Notes (Signed)
Anesthesia Regional Block: Adductor canal block   Pre-Anesthetic Checklist: , timeout performed,  Correct Patient, Correct Site, Correct Laterality,  Correct Procedure, Correct Position, site marked,  Risks and benefits discussed,  Surgical consent,  Pre-op evaluation,  At surgeon's request and post-op pain management  Laterality: Right  Prep: Dura Prep       Needles:  Injection technique: Single-shot  Needle Type: Echogenic Stimulator Needle     Needle Length: 10cm  Needle Gauge: 20     Additional Needles:   Procedures:,,,, ultrasound used (permanent image in chart),,    Narrative:  Start time: 03/25/2022 4:46 PM End time: 03/25/2022 4:49 PM Injection made incrementally with aspirations every 5 mL.  Performed by: Personally  Anesthesiologist: Darral Dash, DO  Additional Notes: Patient identified. Risks/Benefits/Options discussed with patient including but not limited to bleeding, infection, nerve damage, failed block, incomplete pain control. Patient expressed understanding and wished to proceed. All questions were answered. Sterile technique was used throughout the entire procedure. Please see nursing notes for vital signs. Aspirated in 5cc intervals with injection for negative confirmation. Patient was given instructions on fall risk and not to get out of bed. All questions and concerns addressed with instructions to call with any issues or inadequate analgesia.

## 2022-03-25 NOTE — Progress Notes (Signed)
Patient stable Ready for surgery today No fracture blisters Risk and benefits discussed.  Plan for her to stay tonight and then discharge tomorrow.

## 2022-03-25 NOTE — Anesthesia Procedure Notes (Signed)
Procedure Name: LMA Insertion Date/Time: 03/25/2022 5:44 PM  Performed by: Dorann Lodge, CRNAPre-anesthesia Checklist: Patient identified, Emergency Drugs available, Suction available and Patient being monitored Patient Re-evaluated:Patient Re-evaluated prior to induction Oxygen Delivery Method: Circle System Utilized Preoxygenation: Pre-oxygenation with 100% oxygen Induction Type: IV induction Ventilation: Mask ventilation without difficulty LMA: LMA inserted LMA Size: 4.0 Number of attempts: 1 Airway Equipment and Method: Bite block Placement Confirmation: positive ETCO2 Tube secured with: Tape Dental Injury: Teeth and Oropharynx as per pre-operative assessment

## 2022-03-25 NOTE — Brief Op Note (Signed)
   03/24/2022 - 03/25/2022  7:16 PM  PATIENT:  Regan Lemming  51 y.o. female  PRE-OPERATIVE DIAGNOSIS:  ankle fracture right bimalleolar  POST-OPERATIVE DIAGNOSIS:  ankle fracture right bimalleolar  PROCEDURE:  Procedure(s): OPEN REDUCTION INTERNAL FIXATION (ORIF) ANKLE FRACTURE  SURGEON:  Surgeon(s): Meredith Pel, MD  ASSISTANT: magnant pa  ANESTHESIA:   general  EBL: 20 ml    No intake/output data recorded.  BLOOD ADMINISTERED: none  DRAINS: none   LOCAL MEDICATIONS USED:  vanco  SPECIMEN:  No Specimen  COUNTS:  YES  TOURNIQUET:  * No tourniquets in log *  DICTATION: .Other Dictation: Dictation Number [id number]  PLAN OF CARE: Admit for overnight observation  PATIENT DISPOSITION:  PACU - hemodynamically stable

## 2022-03-25 NOTE — Anesthesia Preprocedure Evaluation (Addendum)
Anesthesia Evaluation  Patient identified by MRN, date of birth, ID band Patient awake    Reviewed: Allergy & Precautions, NPO status , Patient's Chart, lab work & pertinent test results  Airway Mallampati: II  TM Distance: >3 FB Neck ROM: Full    Dental no notable dental hx.    Pulmonary Current Smoker and Patient abstained from smoking.,    Pulmonary exam normal        Cardiovascular hypertension, Pt. on medications  Rhythm:Regular Rate:Normal     Neuro/Psych Anxiety Depression TIA   GI/Hepatic Neg liver ROS, GERD  ,  Endo/Other  negative endocrine ROS  Renal/GU negative Renal ROS  negative genitourinary   Musculoskeletal  (+) Arthritis , Osteoarthritis,  Right ankle fracture   Abdominal Normal abdominal exam  (+)   Peds  Hematology  (+) Blood dyscrasia, anemia ,   Anesthesia Other Findings   Reproductive/Obstetrics                           Anesthesia Physical Anesthesia Plan  ASA: 3  Anesthesia Plan: General and Regional   Post-op Pain Management: Regional block* and Tylenol PO (pre-op)*   Induction: Intravenous  PONV Risk Score and Plan: 2 and Ondansetron, Dexamethasone, Midazolam and Treatment may vary due to age or medical condition  Airway Management Planned: Mask and LMA  Additional Equipment: None  Intra-op Plan:   Post-operative Plan: Extubation in OR  Informed Consent: I have reviewed the patients History and Physical, chart, labs and discussed the procedure including the risks, benefits and alternatives for the proposed anesthesia with the patient or authorized representative who has indicated his/her understanding and acceptance.     Dental advisory given  Plan Discussed with: CRNA  Anesthesia Plan Comments: (Lab Results      Component                Value               Date                      WBC                      4.7                 03/25/2022                 HGB                      11.6 (L)            03/25/2022                HCT                      34.6 (L)            03/25/2022                MCV                      92.5                03/25/2022                PLT  250                 03/25/2022           Lab Results      Component                Value               Date                      NA                       141                 03/25/2022                K                        3.4 (L)             03/25/2022                CO2                      29                  03/25/2022                GLUCOSE                  105 (H)             03/25/2022                BUN                      <5 (L)              03/25/2022                CREATININE               0.61                03/25/2022                CALCIUM                  9.1                 03/25/2022                GFRNONAA                 >60                 03/25/2022          )        Anesthesia Quick Evaluation

## 2022-03-26 ENCOUNTER — Encounter (HOSPITAL_COMMUNITY): Payer: Self-pay | Admitting: Orthopedic Surgery

## 2022-03-26 MED ORDER — LOPERAMIDE HCL 2 MG PO CAPS
2.0000 mg | ORAL_CAPSULE | ORAL | Status: DC | PRN
  Administered 2022-03-26: 2 mg via ORAL
  Filled 2022-03-26: qty 1

## 2022-03-26 NOTE — Plan of Care (Signed)

## 2022-03-26 NOTE — Evaluation (Signed)
Physical Therapy Evaluation Patient Details Name: Judith Drake MRN: [id number] DOB: 12-05-1970 Today's Date: 03/26/2022  History of Present Illness  51 y/o female presented to ED on 03/24/22 following fall down steps. Found to have R bimalleolar ankle fx. S/p ORIF R ankle on 7/19. PMH: HTN, ETOH abuse  Clinical Impression  Patient admitted with above. 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 Patient presents with weakness, impaired balance, and decreased activity tolerance. Educated patient on WB restrictions, patient demonstrated understanding. Able to maintain NWB throughout session without difficulty. Deferred stair negotiation due to increasing pain in dependent position. Patient will benefit from skilled PT services during acute stay to address listed deficits. No PT follow up recommended at this time. Would benefit from OPPT once cleared from WB restrictions.        Recommendations for follow up therapy are one component of a multi-disciplinary discharge planning process, led by the attending physician.  Recommendations may be updated based on patient status, additional functional criteria and insurance authorization.  Follow Up Recommendations No PT follow up (may benefit from OPPT once WB restrictions are lifted)      Assistance Recommended at Discharge PRN  Patient can return home with the following  A little help with walking and/or transfers;A little help with bathing/dressing/bathroom;Assistance with cooking/housework;Assist for transportation;Help with stairs or ramp for entrance    Equipment Recommendations Rolling Clyda Smyth (2 wheels);Other (comment) (transport chair)  Recommendations for Other Services       Functional Status Assessment Patient has had a recent decline in their functional status and demonstrates the ability to make significant improvements in function in a reasonable and predictable amount of time.      Precautions / Restrictions Precautions Precautions: Fall Required Braces or Orthoses: Splint/Cast Splint/Cast: R ankle Restrictions Weight Bearing Restrictions: Yes RLE Weight Bearing: Non weight bearing      Mobility  Bed Mobility Overal bed mobility: Modified Independent                  Transfers Overall transfer level: Modified independent Equipment used: Rolling Mildred Tuccillo (2 wheels)                    Ambulation/Gait Ambulation/Gait assistance: Supervision Gait Distance (Feet): 35 Feet Assistive device: Rolling Morgan Rennert (2 wheels) Gait Pattern/deviations:  (hop to)       General Gait Details: good ability to maintain NWB on R throughout  Stairs Stairs:  (patient deferred due to increasing pain in dependent position)          Wheelchair Mobility    Modified Rankin (Stroke Patients Only)       Balance Overall balance assessment: Needs assistance Sitting-balance support: Feet supported Sitting balance-Leahy Scale: Good     Standing balance support: Bilateral upper extremity supported, Reliant on assistive device for balance Standing balance-Leahy Scale: Poor                               Pertinent Vitals/Pain Pain Assessment Pain Assessment: Faces Faces Pain Scale: Hurts whole lot Pain Location: R ankle Pain Descriptors / Indicators: Grimacing, Guarding, Throbbing Pain Intervention(s): Monitored during session, Repositioned, Premedicated before session    Home Living Family/patient expects to be discharged to:: Private residence Living Arrangements: Children;Spouse/significant other Available Help at Discharge: Family;Available PRN/intermittently Type of Home: House Home Access: Stairs to enter Entrance Stairs-Rails: None Entrance Stairs-Number of Steps: 2   Home  Layout: One level Home Equipment: None Additional Comments: lives with adult son who has severe autism; daughter lives next door and pt's father nearby     Prior Function Prior Level of Function : Independent/Modified Independent;Driving             Mobility Comments: Indep without DME. 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       Higher education careers adviser        Extremity/Trunk Assessment   Upper Extremity Assessment Upper Extremity Assessment: Overall WFL for tasks assessed    Lower Extremity Assessment Lower Extremity Assessment: RLE deficits/detail RLE Deficits / Details: s/p R ORIF ankle; unable to assess ankle due to splint    Cervical / Trunk Assessment Cervical / Trunk Assessment: Normal  Communication   Communication: No difficulties  Cognition Arousal/Alertness: Awake/alert Behavior During Therapy: WFL for tasks assessed/performed Overall Cognitive Status: Within Functional Limits for tasks assessed                                          General Comments      Exercises     Assessment/Plan    PT Assessment Patient needs continued PT services  PT Problem List Decreased strength;Decreased activity tolerance;Decreased balance;Decreased mobility       PT Treatment Interventions DME instruction;Gait training;Stair training;Functional mobility training;Therapeutic activities;Balance training;Therapeutic exercise;Patient/family education    PT Goals (Current goals can be found in the Care Plan section)  Acute Rehab PT Goals Patient Stated Goal: to go home when pain better controlled PT Goal Formulation: With patient Time For Goal Achievement: 04/02/22 Potential to Achieve Goals: Good    Frequency Min 5X/week     Co-evaluation               AM-PAC PT "6 Clicks" Mobility  Outcome Measure Help needed turning from your back to your side while in a flat bed without using bedrails?: None Help needed moving from lying on your back to sitting on the side of a flat bed without using bedrails?: None Help needed moving to and from a bed to a chair (including a wheelchair)?:  None Help needed standing up from a chair using your arms (e.g., wheelchair or bedside chair)?: None Help needed to walk in hospital room?: A Little Help needed climbing 3-5 steps with a railing? : A Little 6 Click Score: 22    End of Session   Activity Tolerance: Patient tolerated treatment well Patient left: in chair;with call bell/phone within reach Nurse Communication: Mobility status PT Visit Diagnosis: Muscle weakness (generalized) (M62.81);Other abnormalities of gait and mobility (R26.89)    Time: 1131-1202 PT Time Calculation (min) (ACUTE ONLY): 31 min   Charges:   PT Evaluation $PT Eval Low Complexity: 1 Low PT Treatments $Therapeutic Activity: 8-22 mins        [place] A. Dan Humphreys PT, DPT Acute Rehabilitation Services Office [phone]   Viviann Spare 03/26/2022, 1:30 PM

## 2022-03-26 NOTE — Progress Notes (Signed)
  Subjective: Patient is a 51 year old female who is POD 1 s/p right ankle ORIF of lateral malleolus fracture.  She is doing well.  She has not ambulated since procedure.  She states block is still in effect.  She is having some tingling that seems to walk to start to wear off at this point.  Denies any chest pain or shortness of breath.  No calf pain.   Objective: Vital signs in last 24 hours: Temp:  [97.6 F (36.4 C)-99 F (37.2 C)] 97.6 F (36.4 C) (07/20 0800) Pulse Rate:  [79-92] 79 (07/20 0800) Resp:  [10-19] 19 (07/20 0452) BP: (109-150)/(77-99) 150/99 (07/20 0800) SpO2:  [84 %-100 %] 100 % (07/20 0800)  Intake/Output from previous day: 07/19 0701 - 07/20 0700 In: 1835.6 [I.V.:1685.6; IV Piggyback:150] Out: 25 [Blood:25] Intake/Output this shift: No intake/output data recorded.  Exam:  Postop splint in place.  Good cap refill to operative foot.  Sensation diminished consistent with nerve block.  No calf tenderness bilaterally.  Labs: Recent Labs    03/25/22 0931  HGB 11.6*   Recent Labs    03/25/22 0931  WBC 4.7  RBC 3.74*  HCT 34.6*  PLT 250   Recent Labs    03/25/22 0931  NA 141  K 3.4*  CL 102  CO2 29  BUN <5*  CREATININE 0.61  GLUCOSE 105*  CALCIUM 9.1   No results for input(s): "LABPT", "INR" in the last 72 hours.  Assessment/Plan: Plan is mobilized with PT today.  Nonweightbearing to the right lower extremity.  Remain in postop splint.  Discussed restrictions with the patient and her family member today.  She would prefer to wait until the block wears off and see how her pain level is prior to discharging.  Hopeful for discharge home today.   Gerrianne Scale Leray Garverick 03/26/2022, 8:44 AM

## 2022-03-27 ENCOUNTER — Telehealth: Payer: Self-pay

## 2022-03-27 DIAGNOSIS — S82891A Other fracture of right lower leg, initial encounter for closed fracture: Secondary | ICD-10-CM | POA: Diagnosis not present

## 2022-03-27 MED ORDER — METHOCARBAMOL 500 MG PO TABS: 500.0000 mg | ORAL_TABLET | Freq: Four times a day (QID) | ORAL | 0 refills | Status: DC | PRN

## 2022-03-27 MED ORDER — ASPIRIN 81 MG PO CHEW: 81.0000 mg | CHEWABLE_TABLET | Freq: Two times a day (BID) | ORAL | 0 refills | Status: DC

## 2022-03-27 NOTE — Progress Notes (Signed)
  Subjective: Patient stable.  Pain controlled.   Objective: Vital signs in last 24 hours: Temp:  [97.6 F (36.4 C)-98.3 F (36.8 C)] 98.3 F (36.8 C) (07/21 0740) Pulse Rate:  [77-80] 77 (07/21 0740) Resp:  [18-19] 19 (07/21 0400) BP: (127-150)/(77-99) 127/77 (07/21 0740) SpO2:  [98 %-100 %] 99 % (07/21 0740)  Intake/Output from previous day: 07/20 0701 - 07/21 0700 In: 600 [P.O.:600] Out: -  Intake/Output this shift: No intake/output data recorded.  Exam:  No cellulitis present Compartment soft  Labs: Recent Labs    03/25/22 0931  HGB 11.6*   Recent Labs    03/25/22 0931  WBC 4.7  RBC 3.74*  HCT 34.6*  PLT 250   Recent Labs    03/25/22 0931  NA 141  K 3.4*  CL 102  CO2 29  BUN <5*  CREATININE 0.61  GLUCOSE 105*  CALCIUM 9.1   No results for input(s): "LABPT", "INR" in the last 72 hours.  Assessment/Plan: Plan for discharge today.   Judith Drake 03/27/2022, 7:44 AM

## 2022-03-27 NOTE — Telephone Encounter (Signed)
Patient called stating that she was told to come back for 1 week postop on Friday, 04/03/2022.  Patient had ORIF, right ankle on 03/25/2022.  Please advise.  Cb# [phone].

## 2022-03-27 NOTE — TOC Transition Note (Addendum)
Transition of Care [facility]) - CM/SW Discharge Note   Patient Details  Name: Judith Drake MRN: [id number] Date of Birth: 05-Jan-1971  [facility] CM/SW Contact:  Sharin Mons, RN Phone Number: 03/27/2022, 12:34 PM  Late entry: 03/27/2022 @ 11:20 am  Clinical Narrative:    Patient will DC to: Home Anticipated DC date: 03/27/2022 Family notified: yes Transport by: car     - s/p ORIF to R ankle fx , 7/19  Per MD patient ready for DC today . RN, patient,  and patient's husband notified of DC. Referral made with [employer] for RW and transport chair. RW delivered to bedside. Transport chair will be delivered to pt's home by [employer]. Post hospital f/u noted on AVS. Pt without  Rx med concerns. Husband to provide transportation to home.  RNCM will sign off for now as intervention is no longer needed. Please consult [place] again if new needs arise.    Final next level of care: Home/Self Care Barriers to Discharge: No Barriers Identified   Patient Goals and CMS Choice     Choice offered to / list presented to : Patient  Discharge Placement     Discharge Plan and Services                DME Arranged: Other see comment (transport chair), RW DME Agency: [employer] Date DME Agency Contacted: 03/26/22 Time DME Agency Contacted: [phone] Representative spoke with at DME Agency: [place] ([place]) Interventions     Readmission Risk Interventions     No data to display

## 2022-03-27 NOTE — TOC Progression Note (Cosign Needed)
    Durable Medical Equipment  (From admission, onward)           Start     Ordered   03/27/22 0913  For home use only DME Walker rolling  Once       Question Answer Comment  Walker: With [place] Wheels   Patient needs a walker to treat with the following condition Gait abnormality      03/27/22 0915   03/26/22 1629  For home use only DME Other see comment  Once       Comments: Transport chair  Question:  Length of Need  Answer:  6 Months   03/26/22 1629

## 2022-03-27 NOTE — Progress Notes (Signed)
Discharge instructions given to patient and spouse at bedside. Detailed medication management given patient verbalized understanding of all instructions.

## 2022-03-27 NOTE — Telephone Encounter (Signed)
Scheduled

## 2022-03-28 NOTE — Discharge Summary (Signed)
Physician Discharge Summary      Patient ID: Judith Drake MRN: [id number] DOB/AGE: 04/11/1971 51 y.o.  Admit date: 03/24/2022 Discharge date: 03/27/2022  Admission Diagnoses:  Principal Problem:   Closed right ankle fracture   Discharge Diagnoses:  Same  Surgeries: Procedure(s): OPEN REDUCTION INTERNAL FIXATION (ORIF) ANKLE FRACTURE on 03/25/2022   Consultants:   Discharged Condition: Stable  Hospital Course: Judith Drake is an 51 y.o. female who was admitted 03/24/2022 with a chief complaint of  Chief Complaint  Patient presents with   Ankle Injury  , and found to have a diagnosis of Closed right ankle fracture.  They were brought to the operating room on 03/25/2022 and underwent the above named procedures.   Patient tolerated the procedure well but was slow to mobilize with physical therapy.  She was admitted and surgery performed the following day after a night of elevation.  She was able to discharge home in good condition with pain controlled.  She will be nonweightbearing until her return clinic appointment.  Prescriptions for aspirin for DVT prophylaxis as well as pain medicine muscle relaxers prescribed. Antibiotics given:  Anti-infectives (From admission, onward)    Start     Dose/Rate Route Frequency Ordered Stop   03/26/22 0600  ceFAZolin (ANCEF) IVPB 2g/100 mL premix  Status:  Discontinued        2 g 200 mL/hr over 30 Minutes Intravenous On call to O.R. 03/25/22 2111 03/25/22 2116   03/26/22 0400  ceFAZolin (ANCEF) IVPB 1 g/50 mL premix        1 g 100 mL/hr over 30 Minutes Intravenous Every 8 hours 03/25/22 2111 03/26/22 2112   03/25/22 1924  vancomycin (VANCOCIN) powder  Status:  Discontinued          As needed 03/25/22 1924 03/25/22 1942   03/25/22 1603  ceFAZolin (ANCEF) 2-4 GM/100ML-% IVPB  Status:  Discontinued       Note to Pharmacy: [facility]: cabinet override      03/25/22 1603 03/25/22 1608   03/25/22 1600  ceFAZolin (ANCEF) 2-4  GM/100ML-% IVPB       Note to Pharmacy: [facility] [place]: cabinet override      03/25/22 1600 03/26/22 0414     .  Recent vital signs:  Vitals:   03/27/22 0400 03/27/22 0740  BP: (!) 149/81 127/77  Pulse: 77 77  Resp: 19   Temp: 98.1 F (36.7 C) 98.3 F (36.8 C)  SpO2: 100% 99%    Recent laboratory studies:  Results for orders placed or performed during the hospital encounter of 03/24/22  Surgical pcr screen   Specimen: Nasal Mucosa; Nasal Swab  Result Value Ref Range   MRSA, PCR NEGATIVE NEGATIVE   Staphylococcus aureus NEGATIVE NEGATIVE  CBC  Result Value Ref Range   WBC 4.7 4.0 - 10.5 K/uL   RBC 3.74 (L) 3.87 - 5.11 MIL/uL   Hemoglobin 11.6 (L) 12.0 - 15.0 g/dL   HCT 34.6 (L) 36.0 - 46.0 %   MCV 92.5 80.0 - 100.0 fL   MCH 31.0 26.0 - 34.0 pg   MCHC 33.5 30.0 - 36.0 g/dL   RDW 12.7 11.5 - 15.5 %   Platelets 250 150 - 400 K/uL   nRBC 0.0 0.0 - 0.2 %  Basic metabolic panel  Result Value Ref Range   Sodium 141 135 - 145 mmol/L   Potassium 3.4 (L) 3.5 - 5.1 mmol/L   Chloride 102 98 - 111 mmol/L   CO2 29  22 - 32 mmol/L   Glucose, Bld 105 (H) 70 - 99 mg/dL   BUN <5 (L) 6 - 20 mg/dL   Creatinine, Ser 0.61 0.44 - 1.00 mg/dL   Calcium 9.1 8.9 - 10.3 mg/dL   GFR, Estimated >60 >60 mL/min   Anion gap 10 5 - 15    Discharge Medications:   Allergies as of 03/27/2022       Reactions   Amoxicillin Nausea And Vomiting   Has patient had a PCN reaction causing immediate rash, facial/tongue/throat swelling, SOB or lightheadedness with hypotension: No Has patient had a PCN reaction causing severe rash involving mucus membranes or skin necrosis: No Has patient had a PCN reaction that required hospitalization: No Has patient had a PCN reaction occurring within the last 10 years: No If all of the above answers are "NO", then may proceed with Cephalosporin use.   Dilaudid [hydromorphone Hcl] Itching   Morphine And Related Itching        Medication List     STOP taking  these medications    Aspirin-Caffeine 845-65 MG Pack   hydroxychloroquine 200 MG tablet Commonly known as: PLAQUENIL       TAKE these medications    amLODipine 10 MG tablet Commonly known as: NORVASC TAKE 1 TABLET BY MOUTH ONCE DAILY   aspirin 81 MG chewable tablet Chew 1 tablet (81 mg total) by mouth 2 (two) times daily.   Cholecalciferol 1.25 MG (50000 UT) Tabs 50,000 units PO qwk for 8 weeks.   escitalopram 10 MG tablet Commonly known as: Lexapro Take 1 tablet (10 mg total) by mouth daily.   methocarbamol 500 MG tablet Commonly known as: ROBAXIN Take 1 tablet (500 mg total) by mouth every 6 (six) hours as needed for muscle spasms.   naproxen 375 MG tablet Commonly known as: NAPROSYN Take 1 tablet (375 mg total) by mouth 2 (two) times daily.   oxyCODONE 5 MG immediate release tablet Commonly known as: Roxicodone Take 1 tablet (5 mg total) by mouth every 4 (four) hours as needed for severe pain.   predniSONE 5 MG tablet Commonly known as: DELTASONE Take 5 mg by mouth daily with breakfast.        Diagnostic Studies: DG Ankle Right Port  Result Date: 03/25/2022 CLINICAL DATA:  Ankle fracture. EXAM: PORTABLE RIGHT ANKLE - 2 VIEW COMPARISON:  March 25, 2022 FINDINGS: Post sideplate screw fixation of the right fibula. Near anatomic alignment. Cast overlies the soft tissues an obscures details. The known distal tibial fracture is not well seen. IMPRESSION: 1. Post sideplate screw fixation of the right fibula. 2. Known distal tibial fracture is not well seen. Electronically Signed   By: Fidela Salisbury M.D.   On: 03/25/2022 20:07   DG Ankle 2 Views Right  Result Date: 03/25/2022 CLINICAL DATA:  Open reduction internal fixation of ankle fracture. EXAM: RIGHT ANKLE - 2 VIEW COMPARISON:  Preoperative radiographs. FINDINGS: Two fluoroscopic spot views of the right ankle obtained in the operating room in frontal and lateral projections. Lateral plate and multi screw  fixation of distal fibular fracture. Fluoroscopy time 21 seconds. Dose 0.28 mGy. IMPRESSION: Intraoperative fluoroscopy during distal fibular fracture ORIF. Electronically Signed   By: Keith Rake M.D.   On: 03/25/2022 19:00   DG C-Arm 1-60 Min-No Report  Result Date: 03/25/2022 Fluoroscopy was utilized by the requesting physician.  No radiographic interpretation.   DG C-Arm 1-60 Min-No Report  Result Date: 03/25/2022 Fluoroscopy was utilized by the requesting physician.  No radiographic interpretation.   DG Ankle Right Port  Result Date: 03/24/2022 CLINICAL DATA:  Right ankle fracture, status post reduction and casting EXAM: PORTABLE RIGHT ANKLE - 2 VIEW COMPARISON:  Radiograph performed earlier on the same date. FINDINGS: Overlying cast obscures fine osseous detail. Interval improvement of the tibiotalar joint now in near anatomic alignment. Redemonstration of the displaced fracture of the posterior tibia and oblique fracture of the fibula. IMPRESSION: 1. Status post reduction and casting radiographs demonstrate improved alignment of the tibiotalar joint. 2. Redemonstration of the oblique fracture of the distal fibula and posterior malleolus. Electronically Signed   By: Keane Police D.O.   On: 03/24/2022 15:34   DG Ankle Complete Right  Result Date: 03/24/2022 CLINICAL DATA:  Trauma, fall EXAM: RIGHT ANKLE - COMPLETE 3+ VIEW COMPARISON:  None Available. FINDINGS: There is a oblique fracture in the distal shaft of right fibula. There is approximally 9 mm distraction of the fracture fragments. There is posterior dislocation of talus in relation to distal articular surface of tibia. There is comminuted fracture in the posterior aspect of distal right tibia. Plantar spur is seen in calcaneus. IMPRESSION: Fracture dislocation of right ankle. There is displaced fracture in the distal shaft of fibula. Electronically Signed   By: Elmer Picker M.D.   On: 03/24/2022 12:07   DG Foot Complete  Right  Result Date: 03/24/2022 CLINICAL DATA:  Trauma, fall EXAM: RIGHT FOOT COMPLETE - 3+ VIEW COMPARISON:  None Available. FINDINGS: Single lateral view of right foot shows fracture or dislocation in the right ankle. There is possible fracture in the distal shaft of fibula. There is soft tissue swelling over the dorsum of the foot. Evaluation of bony structures in the foot is limited without the AP and oblique views. Plantar spur is seen in calcaneus. IMPRESSION: Fracture dislocation in right ankle. Electronically Signed   By: Elmer Picker M.D.   On: 03/24/2022 12:05    Disposition: Discharge disposition: 01-Home or Self Care       Discharge Instructions     Call MD / Call 911   Complete by: As directed    If you experience chest pain or shortness of breath, CALL 911 and be transported to the hospital emergency room.  If you develope a fever above 101 F, pus (white drainage) or increased drainage or redness at the wound, or calf pain, call your surgeon's office.   Constipation Prevention   Complete by: As directed    Drink plenty of fluids.  Prune juice may be helpful.  You may use a stool softener, such as Colace (over the counter) 100 mg twice a day.  Use MiraLax (over the counter) for constipation as needed.   Diet - low sodium heart healthy   Complete by: As directed    Discharge instructions   Complete by: As directed    Elevate right foot is much as possible Do not walk on the cast Return to clinic in 1 week next Friday   Increase activity slowly as tolerated   Complete by: As directed    Post-operative opioid taper instructions:   Complete by: As directed    POST-OPERATIVE OPIOID TAPER INSTRUCTIONS: It is important to wean off of your opioid medication as soon as possible. If you do not need pain medication after your surgery it is ok to stop day one. Opioids include: Codeine, Hydrocodone(Norco, Vicodin), Oxycodone(Percocet, oxycontin) and hydromorphone amongst  others.  Long term and even short term use of opiods can cause:  Increased pain response Dependence Constipation Depression Respiratory depression And more.  Withdrawal symptoms can include Flu like symptoms Nausea, vomiting And more Techniques to manage these symptoms Hydrate well Eat regular healthy meals Stay active Use relaxation techniques(deep breathing, meditating, yoga) Do Not substitute Alcohol to help with tapering If you have been on opioids for less than two weeks and do not have pain than it is ok to stop all together.  Plan to wean off of opioids This plan should start within one week post op of your joint replacement. Maintain the same interval or time between taking each dose and first decrease the dose.  Cut the total daily intake of opioids by one tablet each day Next start to increase the time between doses. The last dose that should be eliminated is the evening dose.           Follow-up Information     Burnard Hawthorne, FNP Follow up.   Specialty: Family Medicine Contact information: [address] [place] [address] [phone]         [place] Follow up.   Specialty: Orthopedics Contact information: [address] [place] [address] [phone]                 Signed: Anderson Malta 03/28/2022, 8:40 AM

## 2022-03-30 ENCOUNTER — Telehealth: Payer: Self-pay

## 2022-03-30 ENCOUNTER — Telehealth: Payer: Self-pay | Admitting: Orthopedic Surgery

## 2022-03-30 ENCOUNTER — Other Ambulatory Visit: Payer: Self-pay | Admitting: Orthopedic Surgery

## 2022-03-30 MED ORDER — HYDROCODONE-ACETAMINOPHEN 7.5-325 MG PO TABS: 1.0000 | ORAL_TABLET | Freq: Four times a day (QID) | ORAL | 0 refills | Status: DC | PRN

## 2022-03-30 NOTE — Telephone Encounter (Signed)
Per discharge note, follow up with PCP post discharge. HFU offered, patient declined. Notes following up with surgery. Agrees to call the office as needed.

## 2022-03-30 NOTE — Telephone Encounter (Signed)
Pt called requesting refill of oxycodone. Please send to [facility]. Pt states the oxycodone really dont work that much. Please prescribed another pain medication or up the dosage. Pt phone number is [phone].

## 2022-03-30 NOTE — Telephone Encounter (Signed)
Norco 7.5 sent

## 2022-03-31 NOTE — Telephone Encounter (Signed)
IC advised submitted.  

## 2022-04-01 ENCOUNTER — Ambulatory Visit (INDEPENDENT_AMBULATORY_CARE_PROVIDER_SITE_OTHER): Payer: BC Managed Care – PPO

## 2022-04-01 ENCOUNTER — Ambulatory Visit (INDEPENDENT_AMBULATORY_CARE_PROVIDER_SITE_OTHER): Payer: BC Managed Care – PPO | Admitting: Orthopedic Surgery

## 2022-04-01 DIAGNOSIS — S82891A Other fracture of right lower leg, initial encounter for closed fracture: Secondary | ICD-10-CM | POA: Diagnosis not present

## 2022-04-01 DIAGNOSIS — G8918 Other acute postprocedural pain: Secondary | ICD-10-CM

## 2022-04-04 ENCOUNTER — Encounter: Payer: Self-pay | Admitting: Orthopedic Surgery

## 2022-04-04 NOTE — Progress Notes (Signed)
Post-Op Visit Note   Patient: Judith Drake           Date of Birth: 01-Aug-1971           MRN: [id number] Visit Date: 04/01/2022 PCP: Burnard Hawthorne, FNP   Assessment & Plan:  Chief Complaint:  Chief Complaint  Patient presents with   Right Ankle - Routine Post Op    03/25/22 right ankle ORIF   Visit Diagnoses:  1. Post-op pain     Plan: Judith Drake is a 51 year old patient right ankle fracture open reduction internal fixation performed 03/25/2022.  On exam the incisions are intact.  No medial sided tenderness.  Ankle dorsiflexion plantarflexion intact with no calf tenderness negative Homans.  Radiographs look good.  Continue with ankle range of motion exercises.  Fracture boot provided.  1 week return.  No radiographs needed at that time.  That would be for incision check and suture removal.  Could potentially start touchdown weightbearing in the fracture boot at that time depending on how her ankle feels.  She will need 2-week return after that next clinic appointment for repeat radiographs and progression to full weightbearing.  Follow-Up Instructions: Return in about 1 week (around 04/08/2022).   Orders:  Orders Placed This Encounter  Procedures   XR Ankle Complete Right   No orders of the defined types were placed in this encounter.   Imaging: No results found.  PMFS History: Patient Active Problem List   Diagnosis Date Noted   Closed right ankle fracture 03/24/2022   Vitamin D deficiency 01/21/2022   HLD (hyperlipidemia) 01/21/2022   TIA (transient ischemic attack) 01/16/2022   OAB (overactive bladder) 01/14/2022   Hand pain 07/19/2020   Dysplasia of cervix, 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   Status post hysterectomy 12/19/2019   Elevated transaminase level 12/18/2019   Hypomagnesemia    Nonallopathic lesion of sacral region 03/28/2018   Nonallopathic lesion of thoracic region 03/28/2018   Nonallopathic lesion of lumbosacral region 03/28/2018   Nonallopathic  lesion of pelvic region 03/28/2018   Nonallopathic lesion of cervical region 03/28/2018   Enlarged thyroid 03/07/2018   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   Lumbar radiculopathy, right 02/08/2018   Anxiety and depression 01/03/2018   Rash 01/03/2018   Right hip pain 01/03/2018   GERD (gastroesophageal reflux disease) 12/13/2017   Hypokalemia 12/13/2017   Depression 11/24/2013   Fatigue 07/12/2013   HTN (hypertension) 07/12/2013   Alcohol abuse 07/12/2013   Tobacco abuse 07/12/2013   Yeast infection of the skin 07/12/2013   Routine general medical examination at a health care facility 07/12/2013   Past Medical History:  Diagnosis Date   Abnormal uterine bleeding (AUB) 03/20/2019   Acute lower UTI 05/12/2019   Acute respiratory failure with hypoxia (HCC) 04/10/2018   Anemia    Arthritis    lower back and hips   Dysmenorrhea 03/20/2019   Elevated liver enzymes 11/24/2013   ETOH abuse    Fatty liver 10/03/2019   Fibroid    GERD (gastroesophageal reflux disease)    Heavy menstrual period    Hypertension    Menorrhagia 12/13/2017   SIRS (systemic inflammatory response syndrome) ([place]) 05/12/2019    Family History  Problem Relation Age of Onset   Cancer Mother 35       Lung    Hyperlipidemia Mother    Hypertension Mother    Stroke Mother    Kidney disease Mother    Diabetes Mother    Heart disease  Mother    Depression Mother    Hyperlipidemia Father    Cancer Paternal Grandfather 34       Colon Cancer - died   Autism Son    Breast cancer Neg Hx     Past Surgical History:  Procedure Laterality Date   CYSTOSCOPY N/A 12/19/2019   Procedure: CYSTOSCOPY;  Surgeon: Aletha Halim, MD;  Location: [place];  Service: Gynecology;  Laterality: N/A;   ELBOW SURGERY     1997    ORIF ANKLE FRACTURE Right 03/25/2022   Procedure: OPEN REDUCTION INTERNAL FIXATION (ORIF) ANKLE FRACTURE;  Surgeon: Meredith Pel, MD;  Location: [place];  Service: Orthopedics;  Laterality: Right;    TOTAL LAPAROSCOPIC HYSTERECTOMY WITH SALPINGECTOMY Bilateral 12/19/2019   Procedure: TOTAL LAPAROSCOPIC HYSTERECTOMY WITH BILATERAL SALPINGECTOMY;  Surgeon: Aletha Halim, MD;  Location: [place];  Service: Gynecology;  Laterality: Bilateral;   TUBAL LIGATION     Social History   Occupational History   Occupation: Homemaker   Tobacco Use   Smoking status: Every Day    Packs/day: 0.50    Types: Cigarettes   Smokeless tobacco: Never  Vaping Use   Vaping Use: Never used  Substance and Sexual Activity   Alcohol use: Yes    Comment: weekly   Drug use: No   Sexual activity: Not Currently    Partners: Male    Birth control/protection: Surgical

## 2022-04-08 ENCOUNTER — Ambulatory Visit (INDEPENDENT_AMBULATORY_CARE_PROVIDER_SITE_OTHER): Payer: BC Managed Care – PPO | Admitting: Orthopedic Surgery

## 2022-04-08 ENCOUNTER — Encounter: Payer: Self-pay | Admitting: Orthopedic Surgery

## 2022-04-08 DIAGNOSIS — S82891D Other fracture of right lower leg, subsequent encounter for closed fracture with routine healing: Secondary | ICD-10-CM

## 2022-04-08 NOTE — Progress Notes (Signed)
Post-Op Visit Note   Patient: Judith Drake           Date of Birth: 09/18/1970           MRN: [id number] Visit Date: 04/08/2022 PCP: Burnard Hawthorne, FNP   Assessment & Plan:  Chief Complaint:  Chief Complaint  Patient presents with   Right Ankle - Follow-up   Visit Diagnoses:  1. Closed fracture of right ankle with routine healing, subsequent encounter     Plan: Judith Drake is a patient who is now 2 weeks out right ankle open reduction internal fixation.  Exam no calf tenderness negative Homans.  Sutures removed.  Steri-Strips applied.  Continue with ankle range of motion at this time.  Her motion looks pretty good with about 10 to 15 degrees of ankle dorsiflexion already past neutral.  Weightbearing as tolerated okay to start in the fracture boot in 7 days.  Would like to give that posterior malleolar fragment about 3 weeks total to get solidly healed enough to start weightbearing in a fracture boot.  4-week return with repeat radiographs at that time.  I think is okay for her to resume prednisone with Plaquenil in 1 week.  Continue aspirin daily for 2 more weeks.  Follow-Up Instructions: Return in about 2 weeks (around 04/22/2022).   Orders:  No orders of the defined types were placed in this encounter.  No orders of the defined types were placed in this encounter.   Imaging: No results found.  PMFS History: Patient Active Problem List   Diagnosis Date Noted   Closed right ankle fracture 03/24/2022   Vitamin D deficiency 01/21/2022   HLD (hyperlipidemia) 01/21/2022   TIA (transient ischemic attack) 01/16/2022   OAB (overactive bladder) 01/14/2022   Hand pain 07/19/2020   Dysplasia of cervix, 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   Status post hysterectomy 12/19/2019   Elevated transaminase level 12/18/2019   Hypomagnesemia    Nonallopathic lesion of sacral region 03/28/2018   Nonallopathic lesion of thoracic region 03/28/2018   Nonallopathic lesion of lumbosacral  region 03/28/2018   Nonallopathic lesion of pelvic region 03/28/2018   Nonallopathic lesion of cervical region 03/28/2018   Enlarged thyroid 03/07/2018   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   Lumbar radiculopathy, right 02/08/2018   Anxiety and depression 01/03/2018   Rash 01/03/2018   Right hip pain 01/03/2018   GERD (gastroesophageal reflux disease) 12/13/2017   Hypokalemia 12/13/2017   Depression 11/24/2013   Fatigue 07/12/2013   HTN (hypertension) 07/12/2013   Alcohol abuse 07/12/2013   Tobacco abuse 07/12/2013   Yeast infection of the skin 07/12/2013   Routine general medical examination at a health care facility 07/12/2013   Past Medical History:  Diagnosis Date   Abnormal uterine bleeding (AUB) 03/20/2019   Acute lower UTI 05/12/2019   Acute respiratory failure with hypoxia (HCC) 04/10/2018   Anemia    Arthritis    lower back and hips   Dysmenorrhea 03/20/2019   Elevated liver enzymes 11/24/2013   ETOH abuse    Fatty liver 10/03/2019   Fibroid    GERD (gastroesophageal reflux disease)    Heavy menstrual period    Hypertension    Menorrhagia 12/13/2017   SIRS (systemic inflammatory response syndrome) ([place]) 05/12/2019    Family History  Problem Relation Age of Onset   Cancer Mother 43       Lung    Hyperlipidemia Mother    Hypertension Mother    Stroke Mother  Kidney disease Mother    Diabetes Mother    Heart disease Mother    Depression Mother    Hyperlipidemia Father    Cancer Paternal Grandfather 45       Colon Cancer - died   Autism Son    Breast cancer Neg Hx     Past Surgical History:  Procedure Laterality Date   CYSTOSCOPY N/A 12/19/2019   Procedure: CYSTOSCOPY;  Surgeon: Aletha Halim, MD;  Location: [place];  Service: Gynecology;  Laterality: N/A;   ELBOW SURGERY     1997    ORIF ANKLE FRACTURE Right 03/25/2022   Procedure: OPEN REDUCTION INTERNAL FIXATION (ORIF) ANKLE FRACTURE;  Surgeon: Meredith Pel, MD;  Location: [place];  Service:  Orthopedics;  Laterality: Right;   TOTAL LAPAROSCOPIC HYSTERECTOMY WITH SALPINGECTOMY Bilateral 12/19/2019   Procedure: TOTAL LAPAROSCOPIC HYSTERECTOMY WITH BILATERAL SALPINGECTOMY;  Surgeon: Aletha Halim, MD;  Location: [place];  Service: Gynecology;  Laterality: Bilateral;   TUBAL LIGATION     Social History   Occupational History   Occupation: Homemaker   Tobacco Use   Smoking status: Every Day    Packs/day: 0.50    Types: Cigarettes   Smokeless tobacco: Never  Vaping Use   Vaping Use: Never used  Substance and Sexual Activity   Alcohol use: Yes    Comment: weekly   Drug use: No   Sexual activity: Not Currently    Partners: Male    Birth control/protection: Surgical

## 2022-04-12 ENCOUNTER — Other Ambulatory Visit: Payer: Self-pay | Admitting: Family

## 2022-04-12 DIAGNOSIS — N3281 Overactive bladder: Secondary | ICD-10-CM

## 2022-04-27 DIAGNOSIS — S82891A Other fracture of right lower leg, initial encounter for closed fracture: Secondary | ICD-10-CM | POA: Diagnosis not present

## 2022-05-06 ENCOUNTER — Ambulatory Visit (INDEPENDENT_AMBULATORY_CARE_PROVIDER_SITE_OTHER): Payer: BC Managed Care – PPO | Admitting: Surgical

## 2022-05-06 ENCOUNTER — Encounter: Payer: Self-pay | Admitting: Orthopedic Surgery

## 2022-05-06 ENCOUNTER — Ambulatory Visit (INDEPENDENT_AMBULATORY_CARE_PROVIDER_SITE_OTHER): Payer: BC Managed Care – PPO

## 2022-05-06 DIAGNOSIS — S82891D Other fracture of right lower leg, subsequent encounter for closed fracture with routine healing: Secondary | ICD-10-CM

## 2022-05-06 DIAGNOSIS — E559 Vitamin D deficiency, unspecified: Secondary | ICD-10-CM | POA: Diagnosis not present

## 2022-05-06 NOTE — Progress Notes (Signed)
Post-Op Visit Note   Patient: Judith Drake           Date of Birth: 26-Feb-1971           MRN: [id number] Visit Date: 05/06/2022 PCP: Burnard Hawthorne, FNP   Assessment & Plan:  Chief Complaint:  Chief Complaint  Patient presents with   Right Ankle - Routine Post Op   Visit Diagnoses:  1. Closed fracture of right ankle with routine healing, subsequent encounter     Plan: Patient is a 51 year old female who presents s/p right ankle ORIF on 03/25/2022.  She is 6 weeks out from procedure.  She stopped her nonweightbearing status and began full weightbearing with ankle sleeve about 2 to 3 weeks ago.  She is ambulating in a sandal today.  Right ankle is doing well overall and not having any lateral sided pain.  There is occasional intermittent swelling.  Most of her pain is in the posterior medial aspect of the ankle.  Actually hurt her more to weight-bear in the fracture boot that it does in the sandal so she discontinued entirely.  On exam, she has 10 degrees of passive dorsiflexion with the knee flexed.  Incision is well-healed with no evidence of infection or dehiscence.  No calf tenderness.  Negative Homans' sign.  Intact ankle dorsiflexion, plantarflexion, EHL, inversion/eversion.  No tenderness over the lateral malleolus or medial malleolus.  Mild tenderness in the retrocalcaneal region.  Radiographs of the right ankle taken today demonstrate hardware that remains in good position with no displacement at the fracture site but no callus formation noted with gap between fracture fragments that remains on lateral view in particular.  Plan is to check her vitamin D today as she was very low about 3 months ago and may continue to be low despite supplementation.  Follow-up in 4 weeks for clinical recheck with new radiographs at the time of the ankle.  Additionally, patient complains of lower thoracic spine pain.  She fell back about a week ago and struck the lower thoracic spine against a  table and has had moderate pain since then.  Pain will wake her up at times.  Is worse with coughing or taking a deep breath or laughing.  She has a burning/sharp sensation in this region that occasional will radiate to the flank at that level.  No radicular pain down the legs.  No numbness or tingling.  On exam, she has no midline tenderness but she does have some tenderness on the left paraspinal musculature with some localized swelling in this region and a little bit of faint bruising.  This is about moderate intensity.  She is in no acute distress.  Plan is obtain T-spine radiographs today that demonstrate no acute fracture that is identified.  She has some chronic well-healed rib fractures at multiple levels.  Plan to see how her symptoms improve, if she has no significant improvement in neck several weeks she will call and we can order CT scan to evaluate for occult fracture.  Follow-Up Instructions: No follow-ups on file.   Orders:  Orders Placed This Encounter  Procedures   XR Ankle Complete Right   XR Thoracic Spine 2 View   Vitamin D (25 hydroxy)   No orders of the defined types were placed in this encounter.   Imaging: No results found.  PMFS History: Patient Active Problem List   Diagnosis Date Noted   Closed right ankle fracture 03/24/2022   Vitamin D deficiency 01/21/2022  HLD (hyperlipidemia) 01/21/2022   TIA (transient ischemic attack) 01/16/2022   OAB (overactive bladder) 01/14/2022   Hand pain 07/19/2020   Dysplasia of cervix, 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   Status post hysterectomy 12/19/2019   Elevated transaminase level 12/18/2019   Hypomagnesemia    Nonallopathic lesion of sacral region 03/28/2018   Nonallopathic lesion of thoracic region 03/28/2018   Nonallopathic lesion of lumbosacral region 03/28/2018   Nonallopathic lesion of pelvic region 03/28/2018   Nonallopathic lesion of cervical region 03/28/2018   Enlarged thyroid 03/07/2018   Arthritis of right  sacroiliac joint 02/15/2018   Lumbar radiculopathy, right 02/08/2018   Anxiety and depression 01/03/2018   Rash 01/03/2018   Right hip pain 01/03/2018   GERD (gastroesophageal reflux disease) 12/13/2017   Hypokalemia 12/13/2017   Depression 11/24/2013   Fatigue 07/12/2013   HTN (hypertension) 07/12/2013   Alcohol abuse 07/12/2013   Tobacco abuse 07/12/2013   Yeast infection of the skin 07/12/2013   Routine general medical examination at a health care facility 07/12/2013   Past Medical History:  Diagnosis Date   Abnormal uterine bleeding (AUB) 03/20/2019   Acute lower UTI 05/12/2019   Acute respiratory failure with hypoxia (HCC) 04/10/2018   Anemia    Arthritis    lower back and hips   Dysmenorrhea 03/20/2019   Elevated liver enzymes 11/24/2013   ETOH abuse    Fatty liver 10/03/2019   Fibroid    GERD (gastroesophageal reflux disease)    Heavy menstrual period    Hypertension    Menorrhagia 12/13/2017   SIRS (systemic inflammatory response syndrome) ([place]) 05/12/2019    Family History  Problem Relation Age of Onset   Cancer Mother 2       Lung    Hyperlipidemia Mother    Hypertension Mother    Stroke Mother    Kidney disease Mother    Diabetes Mother    Heart disease Mother    Depression Mother    Hyperlipidemia Father    Cancer Paternal Grandfather 50       Colon Cancer - died   Autism Son    Breast cancer Neg Hx     Past Surgical History:  Procedure Laterality Date   CYSTOSCOPY N/A 12/19/2019   Procedure: CYSTOSCOPY;  Surgeon: Aletha Halim, MD;  Location: [place];  Service: Gynecology;  Laterality: N/A;   ELBOW SURGERY     1997    ORIF ANKLE FRACTURE Right 03/25/2022   Procedure: OPEN REDUCTION INTERNAL FIXATION (ORIF) ANKLE FRACTURE;  Surgeon: Meredith Pel, MD;  Location: [place];  Service: Orthopedics;  Laterality: Right;   TOTAL LAPAROSCOPIC HYSTERECTOMY WITH SALPINGECTOMY Bilateral 12/19/2019   Procedure: TOTAL LAPAROSCOPIC HYSTERECTOMY WITH BILATERAL  SALPINGECTOMY;  Surgeon: Aletha Halim, MD;  Location: [place];  Service: Gynecology;  Laterality: Bilateral;   TUBAL LIGATION     Social History   Occupational History   Occupation: Homemaker   Tobacco Use   Smoking status: Every Day    Packs/day: 0.50    Types: Cigarettes   Smokeless tobacco: Never  Vaping Use   Vaping Use: Never used  Substance and Sexual Activity   Alcohol use: Yes    Comment: weekly   Drug use: No   Sexual activity: Not Currently    Partners: Male    Birth control/protection: Surgical

## 2022-05-07 ENCOUNTER — Other Ambulatory Visit: Payer: Self-pay | Admitting: Surgical

## 2022-05-07 LAB — VITAMIN D 25 HYDROXY (VIT D DEFICIENCY, FRACTURES): Vit D, 25-Hydroxy: 30 ng/mL (ref 30–100)

## 2022-05-07 MED ORDER — VITAMIN D (ERGOCALCIFEROL) 1.25 MG (50000 UNIT) PO CAPS: 50000.0000 [IU] | ORAL_CAPSULE | ORAL | 0 refills | Status: DC

## 2022-05-07 NOTE — Progress Notes (Signed)
Sent in RX

## 2022-05-07 NOTE — Progress Notes (Signed)
Pls call thx neesd more d for 6 w

## 2022-05-28 DIAGNOSIS — S82891A Other fracture of right lower leg, initial encounter for closed fracture: Secondary | ICD-10-CM | POA: Diagnosis not present

## 2022-06-05 ENCOUNTER — Encounter: Payer: BC Managed Care – PPO | Admitting: Surgical

## 2022-06-08 ENCOUNTER — Encounter: Payer: BC Managed Care – PPO | Admitting: Orthopedic Surgery

## 2022-06-27 DIAGNOSIS — S82891A Other fracture of right lower leg, initial encounter for closed fracture: Secondary | ICD-10-CM | POA: Diagnosis not present

## 2022-12-14 ENCOUNTER — Other Ambulatory Visit: Payer: Self-pay | Admitting: Family

## 2022-12-14 DIAGNOSIS — I10 Essential (primary) hypertension: Secondary | ICD-10-CM

## 2022-12-25 ENCOUNTER — Ambulatory Visit: Payer: BC Managed Care – PPO | Admitting: Family

## 2023-02-05 ENCOUNTER — Encounter (HOSPITAL_COMMUNITY): Payer: Self-pay

## 2023-02-05 ENCOUNTER — Ambulatory Visit (HOSPITAL_COMMUNITY)
Admission: RE | Admit: 2023-02-05 | Discharge: 2023-02-05 | Disposition: A | Discharge status: Home / Self Care | Payer: Self-pay | Source: Ambulatory Visit | Attending: Emergency Medicine | Admitting: Emergency Medicine

## 2023-02-05 VITALS — BP 121/85 | HR 88 | Temp 98.3°F | Resp 14

## 2023-02-05 DIAGNOSIS — H6993 Unspecified Eustachian tube disorder, bilateral: Secondary | ICD-10-CM

## 2023-02-05 DIAGNOSIS — M542 Cervicalgia: Secondary | ICD-10-CM

## 2023-02-05 MED ORDER — FLUTICASONE PROPIONATE 50 MCG/ACT NA SUSP: 2.0000 | Freq: Every day | NASAL | 2 refills | Status: DC

## 2023-02-05 MED ORDER — CETIRIZINE HCL 10 MG PO TABS: 10.0000 mg | ORAL_TABLET | Freq: Every day | ORAL | 2 refills | Status: DC

## 2023-02-05 NOTE — ED Triage Notes (Signed)
Patient has multiple complaints. Patient c/o an intermittent headache x 2-3 weeks Intermittent neck, shoulder and mid back pain x 1 week. Constant bilateral ear pain and decreased hearing x 2-3 weeks R>L. And fatigue x 1 week.  patient states she has been taking BC powder and the last dose was 1400 today.

## 2023-02-05 NOTE — ED Provider Notes (Signed)
[facility]    CSN: [id number] Arrival date & time: 02/05/23  1526     History   Chief Complaint Chief Complaint  Patient presents with   Otalgia   Headache   Fatigue   Neck Pain   Back Pain    HPI Judith Drake is a 52 y.o. female.  Here with 2 to 3-week history of bilateral ear pressure.  Her hearing feels muffled especially in the right ear.  She denies any pain just discomfort.  Thinks there is fluid behind her ears.  She has not tried any medications. No runny nose.   Also some muscular neck pain for about a week.  When she turns her head side-to-side it feels sore.  She has not taken any medicines or tried any interventions. No fever or back pain. Denies weakness, numbness/tingling of extremities. No injury known.   Past Medical History:  Diagnosis Date   Abnormal uterine bleeding (AUB) 03/20/2019   Acute lower UTI 05/12/2019   Acute respiratory failure with hypoxia (HCC) 04/10/2018   Anemia    Arthritis    lower back and hips   Dysmenorrhea 03/20/2019   Elevated liver enzymes 11/24/2013   ETOH abuse    Fatty liver 10/03/2019   Fibroid    GERD (gastroesophageal reflux disease)    Heavy menstrual period    Hypertension    Menorrhagia 12/13/2017   SIRS (systemic inflammatory response syndrome) (HCC) 05/12/2019    Patient Active Problem List   Diagnosis Date Noted   Closed right ankle fracture 03/24/2022   Vitamin D deficiency 01/21/2022   HLD (hyperlipidemia) 01/21/2022   TIA (transient ischemic attack) 01/16/2022   OAB (overactive bladder) 01/14/2022   Hand pain 07/19/2020   Dysplasia of cervix, 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   Status post hysterectomy 12/19/2019   Elevated transaminase level 12/18/2019   Hypomagnesemia    Nonallopathic lesion of sacral region 03/28/2018   Nonallopathic lesion of thoracic region 03/28/2018   Nonallopathic lesion of lumbosacral region 03/28/2018   Nonallopathic lesion of pelvic region 03/28/2018   Nonallopathic  lesion of cervical region 03/28/2018   Enlarged thyroid 03/07/2018   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   Lumbar radiculopathy, right 02/08/2018   Anxiety and depression 01/03/2018   Rash 01/03/2018   Right hip pain 01/03/2018   GERD (gastroesophageal reflux disease) 12/13/2017   Hypokalemia 12/13/2017   Depression 11/24/2013   Fatigue 07/12/2013   HTN (hypertension) 07/12/2013   Alcohol abuse 07/12/2013   Tobacco abuse 07/12/2013   Yeast infection of the skin 07/12/2013   Routine general medical examination at a health care facility 07/12/2013    Past Surgical History:  Procedure Laterality Date   BACK SURGERY     CYSTOSCOPY N/A 12/19/2019   Procedure: CYSTOSCOPY;  Surgeon: [place] Bing, MD;  Location: [facility] OR;  Service: Gynecology;  Laterality: N/A;   ELBOW SURGERY     1997    ORIF ANKLE FRACTURE Right 03/25/2022   Procedure: OPEN REDUCTION INTERNAL FIXATION (ORIF) ANKLE FRACTURE;  Surgeon: Cammy Copa, MD;  Location: [facility] OR;  Service: Orthopedics;  Laterality: Right;   TOTAL LAPAROSCOPIC HYSTERECTOMY WITH SALPINGECTOMY Bilateral 12/19/2019   Procedure: TOTAL LAPAROSCOPIC HYSTERECTOMY WITH BILATERAL SALPINGECTOMY;  Surgeon: Greer Bing, MD;  Location: [facility] OR;  Service: Gynecology;  Laterality: Bilateral;   TUBAL LIGATION      OB History     Gravida  2   Para  2   Term  2   Preterm  AB      Living  2      SAB      IAB      Ectopic      Multiple      Live Births  2        Obstetric Comments  Vaginal x 2          Home Medications    Prior to Admission medications   Medication Sig Start Date End Date Taking? Authorizing Provider  cetirizine (ZYRTEC ALLERGY) 10 MG tablet Take 1 tablet (10 mg total) by mouth daily. 02/05/23  Yes Chidi Shirer, Lurena Joiner, PA-C  fluticasone (FLONASE) 50 MCG/ACT nasal spray Place 2 sprays into both nostrils daily. 02/05/23  Yes Malissia Rabbani, PA-C  amLODipine (NORVASC) 10 MG tablet TAKE ONE TABLET  BY MOUTH ONCE DAILY 12/14/22   Allegra Grana, FNP  aspirin 81 MG chewable tablet Chew 1 tablet (81 mg total) by mouth 2 (two) times daily. 03/27/22   Cammy Copa, MD    Family History Family History  Problem Relation Age of Onset   Cancer Mother 29       Lung    Hyperlipidemia Mother    Hypertension Mother    Stroke Mother    Kidney disease Mother    Diabetes Mother    Heart disease Mother    Depression Mother    Hyperlipidemia Father    Cancer Paternal Grandfather 11       Colon Cancer - died   Autism Son    Breast cancer Neg Hx     Social History Social History   Tobacco Use   Smoking status: Some Days    Packs/day: .15    Types: Cigarettes   Smokeless tobacco: Never  Vaping Use   Vaping Use: Never used  Substance Use Topics   Alcohol use: Not Currently    Comment: weekly   Drug use: No     Allergies   Amoxicillin, Dilaudid [hydromorphone hcl], and Morphine and codeine   Review of Systems Review of Systems As per HPI  Physical Exam Triage Vital Signs ED Triage Vitals  Enc Vitals Group     BP 02/05/23 1548 121/85     Pulse Rate 02/05/23 1548 88     Resp 02/05/23 1548 14     Temp 02/05/23 1548 98.3 F (36.8 C)     Temp Source 02/05/23 1548 Oral     SpO2 02/05/23 1548 98 %     Weight --      Height --      Head Circumference --      Peak Flow --      Pain Score 02/05/23 1549 2     Pain Loc --      Pain Edu? --      Excl. in GC? --    No data found.  Updated Vital Signs BP 121/85 (BP Location: Right Arm)   Pulse 88   Temp 98.3 F (36.8 C) (Oral)   Resp 14   LMP  (LMP Unknown)   SpO2 98%   Physical Exam Vitals and nursing note reviewed.  Constitutional:      General: She is not in acute distress.    Appearance: She is not ill-appearing.  HENT:     Right Ear: Tympanic membrane, ear canal and external ear normal.     Left Ear: Tympanic membrane, ear canal and external ear normal.     Ears:     Comments: Bilat canals  are  clear. TMs are pearly. Possible clear fluid behind     Nose: No congestion or rhinorrhea.     Mouth/Throat:     Mouth: Mucous membranes are moist.     Pharynx: Oropharynx is clear. No posterior oropharyngeal erythema.  Eyes:     Conjunctiva/sclera: Conjunctivae normal.  Neck:     Comments: No bony tenderness Cardiovascular:     Rate and Rhythm: Normal rate and regular rhythm.     Pulses: Normal pulses.     Heart sounds: Normal heart sounds.  Pulmonary:     Effort: Pulmonary effort is normal.     Breath sounds: Normal breath sounds.  Musculoskeletal:     Cervical back: Normal range of motion. No tenderness.  Lymphadenopathy:     Cervical: No cervical adenopathy.  Skin:    General: Skin is warm and dry.  Neurological:     Mental Status: She is alert and oriented to person, place, and time.     UC Treatments / Results  Labs (all labs ordered are listed, but only abnormal results are displayed) Labs Reviewed - No data to display  EKG   Radiology No results found.  Procedures Procedures (including critical care time)  Medications Ordered in UC Medications - No data to display  Initial Impression / Assessment and Plan / UC Course  I have reviewed the triage vital signs and the nursing notes.  Pertinent labs & imaging results that were available during my care of the patient were reviewed by me and considered in my medical decision making (see chart for details).  Suspect eustachian tube dysfunction.  No sign of infection today.  Recommend trying once daily nasal spray and allergy med.  Can follow-up with ENT if still having symptoms. Neck pain likely muscular, no red flags today.  No indication for imaging.  She has not tried any medications and I recommend trying ibuprofen/Tylenol and seeing how she does over the next few days.  Follow with primary care. Questions answered   Final Clinical Impressions(s) / UC Diagnoses   Final diagnoses:  Eustachian tube disorder,  bilateral  Neck discomfort     Discharge Instructions      I recommend to use a once daily allergy medicine with nasal spray every day. I recommend Zyrtec and Flonase. Did help to reduce pressure and fluid behind the ears Use for at least a week.  Please follow-up with the ear specialist if you are still having discomfort.  For muscle pain you can use either ibuprofen or Tylenol, or both.  Also try hot pad.  Please follow-up with your primary care provider.  I have attached the community [facility] wellness clinic information    ED Prescriptions     Medication Sig Dispense Auth. Provider   cetirizine (ZYRTEC ALLERGY) 10 MG tablet Take 1 tablet (10 mg total) by mouth daily. 30 tablet Mikey Maffett, PA-C   fluticasone (FLONASE) 50 MCG/ACT nasal spray Place 2 sprays into both nostrils daily. 9.9 mL Jaman Aro, Lurena Joiner, PA-C      PDMP not reviewed this encounter.   Marlow Baars, [place] 02/05/23 1715

## 2023-02-05 NOTE — Discharge Instructions (Addendum)
I recommend to use a once daily allergy medicine with nasal spray every day. I recommend Zyrtec and Flonase. Did help to reduce pressure and fluid behind the ears Use for at least a week.  Please follow-up with the ear specialist if you are still having discomfort.  For muscle pain you can use either ibuprofen or Tylenol, or both.  Also try hot pad.  Please follow-up with your primary care provider.  I have attached the community health and wellness clinic information

## 2023-04-20 ENCOUNTER — Other Ambulatory Visit: Payer: Self-pay

## 2023-04-20 ENCOUNTER — Emergency Department (HOSPITAL_BASED_OUTPATIENT_CLINIC_OR_DEPARTMENT_OTHER): Payer: Self-pay

## 2023-04-20 ENCOUNTER — Ambulatory Visit (HOSPITAL_COMMUNITY): Payer: Self-pay

## 2023-04-20 ENCOUNTER — Encounter (HOSPITAL_BASED_OUTPATIENT_CLINIC_OR_DEPARTMENT_OTHER): Payer: Self-pay | Admitting: Pediatrics

## 2023-04-20 ENCOUNTER — Emergency Department (HOSPITAL_BASED_OUTPATIENT_CLINIC_OR_DEPARTMENT_OTHER)
Admission: EM | Admit: 2023-04-20 | Discharge: 2023-04-20 | Disposition: A | Discharge status: Home / Self Care | Payer: Self-pay | Attending: Emergency Medicine | Admitting: Emergency Medicine

## 2023-04-20 DIAGNOSIS — R5383 Other fatigue: Secondary | ICD-10-CM

## 2023-04-20 DIAGNOSIS — I10 Essential (primary) hypertension: Secondary | ICD-10-CM | POA: Insufficient documentation

## 2023-04-20 DIAGNOSIS — D509 Iron deficiency anemia, unspecified: Secondary | ICD-10-CM | POA: Insufficient documentation

## 2023-04-20 DIAGNOSIS — Z20822 Contact with and (suspected) exposure to covid-19: Secondary | ICD-10-CM | POA: Insufficient documentation

## 2023-04-20 DIAGNOSIS — Z79899 Other long term (current) drug therapy: Secondary | ICD-10-CM | POA: Insufficient documentation

## 2023-04-20 DIAGNOSIS — E860 Dehydration: Secondary | ICD-10-CM | POA: Insufficient documentation

## 2023-04-20 DIAGNOSIS — Z7982 Long term (current) use of aspirin: Secondary | ICD-10-CM | POA: Insufficient documentation

## 2023-04-20 DIAGNOSIS — R531 Weakness: Secondary | ICD-10-CM | POA: Insufficient documentation

## 2023-04-20 LAB — COMPREHENSIVE METABOLIC PANEL
ALT: 34 U/L (ref 0–44)
AST: 43 U/L — ABNORMAL HIGH (ref 15–41)
Albumin: 3.8 g/dL (ref 3.5–5.0)
Alkaline Phosphatase: 143 U/L — ABNORMAL HIGH (ref 38–126)
Anion gap: 17 — ABNORMAL HIGH (ref 5–15)
BUN: 11 mg/dL (ref 6–20)
CO2: 21 mmol/L — ABNORMAL LOW (ref 22–32)
Calcium: 8.8 mg/dL — ABNORMAL LOW (ref 8.9–10.3)
Chloride: 95 mmol/L — ABNORMAL LOW (ref 98–111)
Creatinine, Ser: 0.68 mg/dL (ref 0.44–1.00)
GFR, Estimated: >60 mL/min (ref >60)
Glucose, Bld: 105 mg/dL — ABNORMAL HIGH (ref 70–99)
Potassium: 3.3 mmol/L — ABNORMAL LOW (ref 3.5–5.1)
Sodium: 133 mmol/L — ABNORMAL LOW (ref 135–145)
Total Bilirubin: 0.8 mg/dL (ref 0.3–1.2)
Total Protein: 8.2 g/dL — ABNORMAL HIGH (ref 6.5–8.1)

## 2023-04-20 LAB — CBC WITH DIFFERENTIAL/PLATELET
Abs Immature Granulocytes: 0.04 10*3/uL (ref 0.00–0.07)
Basophils Absolute: 0.1 10*3/uL (ref 0.0–0.1)
Basophils Relative: 1 %
Eosinophils Absolute: 0.1 10*3/uL (ref 0.0–0.5)
Eosinophils Relative: 2 %
HCT: 32.5 % — ABNORMAL LOW (ref 36.0–46.0)
Hemoglobin: 10.6 g/dL — ABNORMAL LOW (ref 12.0–15.0)
Immature Granulocytes: 1 %
Lymphocytes Relative: 18 %
Lymphs Abs: 1.1 10*3/uL (ref 0.7–4.0)
MCH: 26.6 pg (ref 26.0–34.0)
MCHC: 32.6 g/dL (ref 30.0–36.0)
MCV: 81.5 fL (ref 80.0–100.0)
Monocytes Absolute: 0.9 10*3/uL (ref 0.1–1.0)
Monocytes Relative: 15 %
Neutro Abs: 3.9 10*3/uL (ref 1.7–7.7)
Neutrophils Relative %: 63 %
Platelets: 345 10*3/uL (ref 150–400)
RBC: 3.99 MIL/uL (ref 3.87–5.11)
RDW: 14.6 % (ref 11.5–15.5)
WBC: 6.2 10*3/uL (ref 4.0–10.5)
nRBC: 0 % (ref 0.0–0.2)

## 2023-04-20 LAB — URINALYSIS, MICROSCOPIC (REFLEX)

## 2023-04-20 LAB — TROPONIN I (HIGH SENSITIVITY)
Troponin I (High Sensitivity): 4 ng/L (ref <18)
Troponin I (High Sensitivity): 3 ng/L (ref <18)

## 2023-04-20 LAB — URINALYSIS, ROUTINE W REFLEX MICROSCOPIC
Glucose, UA: NEGATIVE mg/dL
Ketones, ur: 15 mg/dL — AB
Leukocytes,Ua: NEGATIVE
Nitrite: NEGATIVE
Protein, ur: NEGATIVE mg/dL
Specific Gravity, Urine: 1.015 (ref 1.005–1.030)
pH: 6 (ref 5.0–8.0)

## 2023-04-20 LAB — D-DIMER, QUANTITATIVE: D-Dimer, Quant: 1.86 ug/mL-FEU — ABNORMAL HIGH (ref 0.00–0.50)

## 2023-04-20 LAB — SARS CORONAVIRUS 2 BY RT PCR: SARS Coronavirus 2 by RT PCR: NEGATIVE

## 2023-04-20 MED ORDER — ONDANSETRON HCL 4 MG/2ML IJ SOLN
4.0000 mg | Freq: Once | INTRAMUSCULAR | Status: AC
  Administered 2023-04-20: 4 mg via INTRAVENOUS
  Filled 2023-04-20: qty 2

## 2023-04-20 MED ORDER — KETOROLAC TROMETHAMINE 15 MG/ML IJ SOLN
15.0000 mg | Freq: Once | INTRAMUSCULAR | Status: AC
  Administered 2023-04-20: 15 mg via INTRAVENOUS
  Filled 2023-04-20: qty 1

## 2023-04-20 MED ORDER — ALBUTEROL SULFATE HFA 108 (90 BASE) MCG/ACT IN AERS: 2.0000 | INHALATION_SPRAY | RESPIRATORY_TRACT | Status: DC | PRN

## 2023-04-20 MED ORDER — IOHEXOL 350 MG/ML SOLN
75.0000 mL | Freq: Once | INTRAVENOUS | Status: AC | PRN
  Administered 2023-04-20: 75 mL via INTRAVENOUS

## 2023-04-20 MED ORDER — PREDNISONE 10 MG (21) PO TBPK: ORAL_TABLET | Freq: Every day | ORAL | 0 refills | Status: DC

## 2023-04-20 MED ORDER — ONDANSETRON 4 MG PO TBDP: 4.0000 mg | ORAL_TABLET | Freq: Three times a day (TID) | ORAL | 0 refills | Status: DC | PRN

## 2023-04-20 MED ORDER — ACETAMINOPHEN 500 MG PO TABS
1000.0000 mg | ORAL_TABLET | Freq: Once | ORAL | Status: AC
  Administered 2023-04-20: 1000 mg via ORAL
  Filled 2023-04-20: qty 2

## 2023-04-20 NOTE — ED Triage Notes (Signed)
C/O shortness of breath, heart racing, severe fatigue and weakness several weeks ago. C/O URI symptoms started over the weekend. C/O sharp stabbing  pain on right lower back.

## 2023-04-20 NOTE — Discharge Instructions (Addendum)
Thank you for coming to [facility] Emergency Department. You were seen for weakness, shortness of breath. We did an exam, labs, and imaging, and these showed likely dehydration and anemia (Hgb 10.6).  Please stay well-hydrated at home.  We will prescribe Zofran in order for you to eat and drink well. Please take a prenatal vitamin with iron in it. Please follow up with your primary care provider within 1 week.  We have attached sheet of resources of low-cost PCPs.  Please also at your earliest convenience with your rheumatologist as your symptoms may be related to your rheumatologic disorder.  In that case, we have prescribed a short course of prednisone taper which may help your symptoms.   Do not hesitate to return to the ED or call 911 if you experience: -Worsening symptoms -Chest pain -Worsening dehydration -Lightheadedness, passing out -Fevers/chills -Anything else that concerns you

## 2023-04-20 NOTE — ED Provider Notes (Signed)
[place] [facility] AT [facility] Provider Note   CSN: [id number] Arrival date & time: 04/20/23  1319     History  Chief Complaint  Patient presents with   Back Pain   Cough   Shortness of Breath    Judith Drake is a 52 y.o. female with PMH as listed below who presents with multiple complaints.  Endorses severe fatigue and generalized weakness for several weeks to months ago. C/O URI symptoms started over the weekend.  This weekend she began to have slight rhinorrhea and a very mild sore throat.  She also had a fever of 101F on Sunday.  She endorses mild SOB and does endorse a cough productive of minimal clear sputum.  Denies any hemoptysis or chest pain.  Does endorse bilateral lower back pain and hip pain that is chronic in nature but is worsening currently.  Nonradiating.  She dates she does have a history of some inflammatory arthritis condition that she used to follow with rheumatology but does not currently take any medications for it. She is not experiencing any joint pain or swelling. She denies any trauma, h/o malignancy, h/o IVDU, saddle anesthesia, bladder/bowel incontinence. Denies diarrhea/constipation, vaginal symptoms. Denies abd pain but endorses some nausea, no vomiting. Denies CP. Denies h/o COPD/asthma. Has h/o hysterectomy for dysmenorrhea/menorrhagia.   Per chart review patient had seen rheumatology at [facility] in 2023.  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  She had improved with a prednisone taper, had a plan to trial DMARDs after she tapered prednisone.  Patient reports she has not seen rheumatology since that time.   Past Medical History:  Diagnosis Date   Abnormal uterine bleeding (AUB) 03/20/2019   Acute lower UTI 05/12/2019   Acute respiratory failure with hypoxia (HCC) 04/10/2018   Anemia    Arthritis    lower back and hips   Dysmenorrhea 03/20/2019   Elevated  liver enzymes 11/24/2013   ETOH abuse    Fatty liver 10/03/2019   Fibroid    GERD (gastroesophageal reflux disease)    Heavy menstrual period    Hypertension    Menorrhagia 12/13/2017   SIRS (systemic inflammatory response syndrome) (HCC) 05/12/2019       Home Medications Prior to Admission medications   Medication Sig Start Date End Date Taking? Authorizing Provider  ondansetron (ZOFRAN-ODT) 4 MG disintegrating tablet Take 1 tablet (4 mg total) by mouth every 8 (eight) hours as needed for nausea or vomiting. 04/20/23  Yes Loetta Rough, MD  predniSONE (STERAPRED UNI-PAK 21 TAB) 10 MG (21) TBPK tablet Take by mouth daily. Take 6 tabs by mouth daily  for 2 days, then 5 tabs for 2 days, then 4 tabs for 2 days, then 3 tabs for 2 days, 2 tabs for 2 days, then 1 tab by mouth daily for 2 days 04/20/23  Yes Loetta Rough, MD  amLODipine (NORVASC) 10 MG tablet TAKE ONE TABLET BY MOUTH ONCE DAILY 12/14/22   Allegra Grana, FNP  aspirin 81 MG chewable tablet Chew 1 tablet (81 mg total) by mouth 2 (two) times daily. 03/27/22   Cammy Copa, MD  cetirizine (ZYRTEC ALLERGY) 10 MG tablet Take 1 tablet (10 mg total) by mouth daily. 02/05/23   Rising, Rebecca, PA-C  fluticasone (FLONASE) 50 MCG/ACT nasal spray Place 2 sprays into both nostrils daily. 02/05/23   Rising, Lurena Joiner, PA-C      Allergies    Amoxicillin, Dilaudid [hydromorphone hcl],  and Morphine and codeine    Review of Systems   Review of Systems A 10 point review of systems was performed and is negative unless otherwise reported in HPI.  Physical Exam Updated Vital Signs BP 125/74 (BP Location: Right Arm)   Pulse 88   Temp 98.6 F (37 C) (Oral)   Resp 16   LMP  (LMP Unknown)   SpO2 95%  Physical Exam General: Normal appearing female, lying in bed.  HEENT: PERRLA, Sclera anicteric, MMM, trachea midline. Clear oropharynx, no tonsillar exudates. Cardiology: RRR, no murmurs/rubs/gallops. BL radial and DP pulses equal  bilaterally.  Resp: Normal respiratory rate and effort. CTAB, no wheezes, rhonchi, crackles.  Abd: Soft, non-tender, non-distended. No rebound tenderness or guarding.  GU: Deferred. MSK: No peripheral edema or signs of trauma. Extremities without deformity or TTP. No cyanosis or clubbing.  Skin: warm, dry. No rashes or lesions. Back: No CVA tenderness. No midline T or L spine TTP.  Neuro: A&Ox4, CNs II-XII grossly intact. MAEs. Sensation grossly intact.  Psych: Normal mood and affect.   ED Results / Procedures / Treatments   Labs (all labs ordered are listed, but only abnormal results are displayed) Labs Reviewed  CBC WITH DIFFERENTIAL/PLATELET - Abnormal; Notable for the following components:      Result Value   Hemoglobin 10.6 (*)    HCT 32.5 (*)    All other components within normal limits  COMPREHENSIVE METABOLIC PANEL - Abnormal; Notable for the following components:   Sodium 133 (*)    Potassium 3.3 (*)    Chloride 95 (*)    CO2 21 (*)    Glucose, Bld 105 (*)    Calcium 8.8 (*)    Total Protein 8.2 (*)    AST 43 (*)    Alkaline Phosphatase 143 (*)    Anion gap 17 (*)    All other components within normal limits  URINALYSIS, ROUTINE W REFLEX MICROSCOPIC - Abnormal; Notable for the following components:   Hgb urine dipstick TRACE (*)    Bilirubin Urine SMALL (*)    Ketones, ur 15 (*)    All other components within normal limits  D-DIMER, QUANTITATIVE - Abnormal; Notable for the following components:   D-Dimer, Quant 1.86 (*)    All other components within normal limits  URINALYSIS, MICROSCOPIC (REFLEX) - Abnormal; Notable for the following components:   Bacteria, UA FEW (*)    All other components within normal limits  SARS CORONAVIRUS 2 BY RT PCR  TROPONIN I (HIGH SENSITIVITY)  TROPONIN I (HIGH SENSITIVITY)    EKG EKG Interpretation Date/Time:  Tuesday April 20 2023 13:39:43 EDT Ventricular Rate:  103 PR Interval:  124 QRS Duration:  101 QT  Interval:  353 QTC Calculation: 463 R Axis:   31  Text Interpretation: Sinus tachycardia Consider right atrial enlargement Confirmed by Vivi Barrack [phone]) on 04/20/2023 3:20:49 PM  Radiology CT Angio Chest PE W and/or Wo Contrast  Result Date: 04/20/2023 CLINICAL DATA:  Shortness of breath, tachycardia EXAM: CT ANGIOGRAPHY CHEST WITH CONTRAST TECHNIQUE: Multidetector CT imaging of the chest was performed using the standard protocol during bolus administration of intravenous contrast. Multiplanar CT image reconstructions and MIPs were obtained to evaluate the vascular anatomy. RADIATION DOSE REDUCTION: This exam was performed according to the departmental dose-optimization program which includes automated exposure control, adjustment of the mA and/or kV according to patient size and/or use of iterative reconstruction technique. CONTRAST:  75mL OMNIPAQUE IOHEXOL 350 MG/ML SOLN COMPARISON:  Chest radiograph  done earlier today FINDINGS: Cardiovascular: There is homogeneous enhancement in thoracic aorta. There are no intraluminal filling defects in pulmonary artery branches. Mediastinum/Nodes: There are subcentimeter nodes in the mediastinum, possibly suggesting reactive hyperplasia. Lungs/Pleura: There is no focal pulmonary consolidation. There is no pleural effusion or pneumothorax. Upper Abdomen: There is fatty infiltration in liver. Small hiatal hernia is seen. Musculoskeletal: No acute findings are seen. Old fractures are seen in left fifth through tenth ribs. Old fracture is seen in the right eighth rib. Degenerative changes with encroachment of neural foramina are noted in the visualized lower cervical spine at C6-C7 and C7-T1 levels. Review of the MIP images confirms the above findings. IMPRESSION: There is no evidence of pulmonary embolism. There is no evidence of thoracic aortic dissection. There is no focal pulmonary consolidation. Fatty liver. Small hiatal hernia. Cervical spondylosis with  encroachment of neural foramina by bony spurs in visualized lower cervical spine. Electronically Signed   By: Ernie Avena M.D.   On: 04/20/2023 17:00   DG Chest 2 View  Result Date: 04/20/2023 CLINICAL DATA:  Cough and shortness of breath EXAM: CHEST - 2 VIEW COMPARISON:  Chest x-ray dated December 01, 2021 FINDINGS: Cardiac and mediastinal contours are within normal limits. Lungs are clear. No evidence of pleural effusion or pneumothorax. Left-greater-than-right old bilateral rib fractures. IMPRESSION: No active cardiopulmonary disease. Electronically Signed   By: Allegra Lai M.D.   On: 04/20/2023 15:12    Procedures Procedures    Medications Ordered in ED Medications  albuterol (VENTOLIN HFA) 108 (90 Base) MCG/ACT inhaler 2 puff (has no administration in time range)  ondansetron (ZOFRAN) injection 4 mg (4 mg Intravenous Given 04/20/23 1556)  acetaminophen (TYLENOL) tablet 1,000 mg (1,000 mg Oral Given 04/20/23 1555)  ketorolac (TORADOL) 15 MG/ML injection 15 mg (15 mg Intravenous Given 04/20/23 1556)  iohexol (OMNIPAQUE) 350 MG/ML injection 75 mL (75 mLs Intravenous Contrast Given 04/20/23 1612)    ED Course/ Medical Decision Making/ A&P                          Medical Decision Making Amount and/or Complexity of Data Reviewed Labs: ordered. Decision-making details documented in ED Course. Radiology: ordered. Decision-making details documented in ED Course.  Risk OTC drugs. Prescription drug management.    This patient presents to the ED with multiple complaints, this involves an extensive number of treatment options, and is a complaint that carries with it a high risk of complications and morbidity.  I considered the following differential and admission for this acute, potentially life threatening condition.   MDM:    Patient is overall hemodynamically stable and very well-appearing.  Patient presents with generalized weakness, fatigue, shortness of breath, cough.  Considered heart failure with report of cough shortness of breath and fatigue, however patient has no pulmonary edema, pleural effusions, lower extremity edema to indicate an heart failure exacerbation.  On x-ray she has no evidence of pneumonia or bronchitis.  She has no wheezing to suggest COPD or asthma.  Considered PE given report of shortness of breath, heart racing, fatigue, cough, and could not PERC out due to tachycardia initially on presentation, and D-dimer was elevated.  CT PE reassuringly negative for any pulmonary embolism or consolidation.  Considered atypical ACS and her troponin is negative x 2.  She has had some nausea and has not taken great p.o., consider dehydration.  Her electrolytes show very mild hyponatremia and hypokalemia, likely due to poor p.o. intake,  will treat with Zofran IV and allow patient to orally rehydrate.  Labs additionally demonstrate mild anemia down from 11.61-year ago.  She states she has a history of this and actually once required a blood transfusion and IV iron infusion.  She asks about her iron level and I relayed this can be checked with her PCP.  I encouraged her to take a prenatal vitamin with iron in the meantime.  She does not have any active bleeding and has a history of a hysterectomy due to vaginal bleeding, no bleeding now and platelets are also within normal limits.  Her alk phos and AST are very mildly elevated likely in the setting of fatty liver shown on CT.  No right upper quadrant tenderness to palpation or cholelithiasis noted on CT to indicate cholecystitis, and total bilirubin is within normal limits, no evidence of cholestasis.  I do believe the patient's symptoms could be explained by viral illness versus her known rheumatologic disorder.  This could explain her fatigue as well as the fever that she had this weekend.  She is afebrile here. Back pain she reports is chronic for her, no red flag symptoms to indicate cauda equina or fracture, but could  represent arthritic symptoms as well.   After full evaluation of the patient I have low suspicion for an acute emergent pathology causing her symptoms today. Can trial a short course of prednisone for possible inflammatory process flare. Patient states she hadn't followed up yet d/t lapse in insurance coverage. Will provide resources for low cost PCP. I believe the patient is stable for outpatient follow-up with her PCP and rheumatologist.  Patient is encouraged to orally rehydrate at home and eat well. DC w/ discharge instructions/return precautions. All questions answered to patient's satisfaction.     Clinical Course as of 04/20/23 1845  Tue Apr 20, 2023  1519 SARS Coronavirus 2 by RT PCR: NEGATIVE Neg [HN]  1520 DG Chest 2 View No active cardiopulmonary disease. [HN]  1520 WBC: 6.2 No leukocytosis  [HN]  1521 Hemoglobin(!): 10.6 Mild anemia. Down from 11.6 one year ago [HN]  1521 Troponin I (High Sensitivity): 4 neg [HN]  1551 D-Dimer, Quant(!): 1.86 Positive dimer. Will get CT PE [HN]  1634 Troponin I (High Sensitivity): 3 Flat trop [HN]  1706 CT Angio Chest PE W and/or Wo Contrast There is no evidence of pulmonary embolism. There is no evidence of thoracic aortic dissection. There is no focal pulmonary consolidation.  Fatty liver. Small hiatal hernia. Cervical spondylosis with encroachment of neural foramina by bony spurs in visualized lower cervical spine.   [HN]  1811 UA w/ no UTI. Mild ketones. [HN]    Clinical Course User Index [HN] Loetta Rough, MD    Labs: I Ordered, and personally interpreted labs.  The pertinent results include:  those listed above  Imaging Studies ordered: I ordered imaging studies including CXR, CTPE I independently visualized and interpreted imaging. I agree with the radiologist interpretation  Additional history obtained from chart review.  External records from outside source obtained and reviewed including rheumatology  visits  Cardiac Monitoring: The patient was maintained on a cardiac monitor.  I personally viewed and interpreted the cardiac monitored which showed an underlying rhythm of: NSR  Reevaluation: After the interventions noted above, I reevaluated the patient and found that they have :improved  Social Determinants of Health: Lives independently  Disposition:  DC  Co morbidities that complicate the patient evaluation  Past Medical History:  Diagnosis Date  Abnormal uterine bleeding (AUB) 03/20/2019   Acute lower UTI 05/12/2019   Acute respiratory failure with hypoxia (HCC) 04/10/2018   Anemia    Arthritis    lower back and hips   Dysmenorrhea 03/20/2019   Elevated liver enzymes 11/24/2013   ETOH abuse    Fatty liver 10/03/2019   Fibroid    GERD (gastroesophageal reflux disease)    Heavy menstrual period    Hypertension    Menorrhagia 12/13/2017   SIRS (systemic inflammatory response syndrome) (HCC) 05/12/2019     Medicines Meds ordered this encounter  Medications   albuterol (VENTOLIN HFA) 108 (90 Base) MCG/ACT inhaler 2 puff   ondansetron (ZOFRAN) injection 4 mg   acetaminophen (TYLENOL) tablet 1,000 mg   ketorolac (TORADOL) 15 MG/ML injection 15 mg   iohexol (OMNIPAQUE) 350 MG/ML injection 75 mL   ondansetron (ZOFRAN-ODT) 4 MG disintegrating tablet    Sig: Take 1 tablet (4 mg total) by mouth every 8 (eight) hours as needed for nausea or vomiting.    Dispense:  20 tablet    Refill:  0   predniSONE (STERAPRED UNI-PAK 21 TAB) 10 MG (21) TBPK tablet    Sig: Take by mouth daily. Take 6 tabs by mouth daily  for 2 days, then 5 tabs for 2 days, then 4 tabs for 2 days, then 3 tabs for 2 days, 2 tabs for 2 days, then 1 tab by mouth daily for 2 days    Dispense:  42 tablet    Refill:  0    I have reviewed the patients home medicines and have made adjustments as needed  Problem List / ED Course: Problem List Items Addressed This Visit       Other   Fatigue   Other Visit Diagnoses      Generalized weakness    -  Primary   Dehydration       Microcytic anemia                       This note was created using dictation software, which may contain spelling or grammatical errors.    Loetta Rough, MD 04/20/23 [phone]

## 2023-04-20 NOTE — ED Notes (Signed)
ED Provider at bedside. 

## 2023-04-21 ENCOUNTER — Telehealth: Payer: Self-pay

## 2023-04-21 NOTE — Telephone Encounter (Signed)
Noted  If she declined appointment for acute symptoms, I will see her 05/27/2023.  Agree with advice to call 911

## 2023-04-21 NOTE — Telephone Encounter (Signed)
Patient states extreme fatigue, weak, tired, shortness of breath, covid or flu symptoms. Patient states she was seen in ED 04/20/2023. Patient states her labs showed anemia and other issues.  Patient states she is unable to come in at 1:30pm which is Rennie Plowman, FNP's open slot.  Patient declined appointment with another provider.  Patient states she is still experiencing a little shortness of breath.  Patient declined offer to speak with Access Nurse.  Patient states she will call 911 if it gets worse.  I scheduled patient for an appointment with Rennie Plowman, FNP, on 05/27/2023 at 2:30pm.  Patient states she needs an afternoon appointment so someone can look after her son.

## 2023-04-22 ENCOUNTER — Encounter (INDEPENDENT_AMBULATORY_CARE_PROVIDER_SITE_OTHER): Payer: Self-pay

## 2023-05-27 ENCOUNTER — Ambulatory Visit: Payer: Self-pay | Admitting: Family

## 2023-06-09 ENCOUNTER — Encounter (HOSPITAL_COMMUNITY): Payer: Self-pay | Admitting: Radiology

## 2023-06-09 ENCOUNTER — Emergency Department (HOSPITAL_COMMUNITY): Payer: Self-pay

## 2023-06-09 ENCOUNTER — Other Ambulatory Visit: Payer: Self-pay

## 2023-06-09 ENCOUNTER — Emergency Department (HOSPITAL_COMMUNITY)
Admission: EM | Admit: 2023-06-09 | Discharge: 2023-06-09 | Disposition: A | Discharge status: Home / Self Care | Payer: Self-pay | Attending: Emergency Medicine | Admitting: Emergency Medicine

## 2023-06-09 DIAGNOSIS — R0602 Shortness of breath: Secondary | ICD-10-CM | POA: Insufficient documentation

## 2023-06-09 DIAGNOSIS — Y92019 Unspecified place in single-family (private) house as the place of occurrence of the external cause: Secondary | ICD-10-CM | POA: Insufficient documentation

## 2023-06-09 DIAGNOSIS — Z1152 Encounter for screening for COVID-19: Secondary | ICD-10-CM | POA: Insufficient documentation

## 2023-06-09 DIAGNOSIS — X58XXXA Exposure to other specified factors, initial encounter: Secondary | ICD-10-CM | POA: Insufficient documentation

## 2023-06-09 DIAGNOSIS — S2231XA Fracture of one rib, right side, initial encounter for closed fracture: Secondary | ICD-10-CM | POA: Insufficient documentation

## 2023-06-09 DIAGNOSIS — E876 Hypokalemia: Secondary | ICD-10-CM | POA: Insufficient documentation

## 2023-06-09 DIAGNOSIS — I10 Essential (primary) hypertension: Secondary | ICD-10-CM | POA: Insufficient documentation

## 2023-06-09 DIAGNOSIS — Z79899 Other long term (current) drug therapy: Secondary | ICD-10-CM | POA: Insufficient documentation

## 2023-06-09 LAB — CBC WITH DIFFERENTIAL/PLATELET
Abs Immature Granulocytes: 0.03 10*3/uL (ref 0.00–0.07)
Basophils Absolute: 0.1 10*3/uL (ref 0.0–0.1)
Basophils Relative: 1 %
Eosinophils Absolute: 0.1 10*3/uL (ref 0.0–0.5)
Eosinophils Relative: 1 %
HCT: 33.2 % — ABNORMAL LOW (ref 36.0–46.0)
Hemoglobin: 10.6 g/dL — ABNORMAL LOW (ref 12.0–15.0)
Immature Granulocytes: 0 %
Lymphocytes Relative: 28 %
Lymphs Abs: 2.5 10*3/uL (ref 0.7–4.0)
MCH: 24.8 pg — ABNORMAL LOW (ref 26.0–34.0)
MCHC: 31.9 g/dL (ref 30.0–36.0)
MCV: 77.8 fL — ABNORMAL LOW (ref 80.0–100.0)
Monocytes Absolute: 0.7 10*3/uL (ref 0.1–1.0)
Monocytes Relative: 7 %
Neutro Abs: 5.6 10*3/uL (ref 1.7–7.7)
Neutrophils Relative %: 63 %
Platelets: 474 10*3/uL — ABNORMAL HIGH (ref 150–400)
RBC: 4.27 MIL/uL (ref 3.87–5.11)
RDW: 18.1 % — ABNORMAL HIGH (ref 11.5–15.5)
WBC: 9 10*3/uL (ref 4.0–10.5)
nRBC: 0 % (ref 0.0–0.2)

## 2023-06-09 LAB — RESP PANEL BY RT-PCR (RSV, FLU A&B, COVID)  RVPGX2
Influenza A by PCR: NEGATIVE
Influenza B by PCR: NEGATIVE
Resp Syncytial Virus by PCR: NEGATIVE
SARS Coronavirus 2 by RT PCR: NEGATIVE

## 2023-06-09 LAB — COMPREHENSIVE METABOLIC PANEL
ALT: 23 U/L (ref 0–44)
AST: 32 U/L (ref 15–41)
Albumin: 3.7 g/dL (ref 3.5–5.0)
Alkaline Phosphatase: 85 U/L (ref 38–126)
Anion gap: 19 — ABNORMAL HIGH (ref 5–15)
BUN: 5 mg/dL — ABNORMAL LOW (ref 6–20)
CO2: 22 mmol/L (ref 22–32)
Calcium: 9.3 mg/dL (ref 8.9–10.3)
Chloride: 93 mmol/L — ABNORMAL LOW (ref 98–111)
Creatinine, Ser: 0.63 mg/dL (ref 0.44–1.00)
GFR, Estimated: >60 mL/min (ref >60)
Glucose, Bld: 99 mg/dL (ref 70–99)
Potassium: 2.7 mmol/L — CL (ref 3.5–5.1)
Sodium: 134 mmol/L — ABNORMAL LOW (ref 135–145)
Total Bilirubin: 0.6 mg/dL (ref 0.3–1.2)
Total Protein: 7.5 g/dL (ref 6.5–8.1)

## 2023-06-09 LAB — URINALYSIS, ROUTINE W REFLEX MICROSCOPIC
Bilirubin Urine: NEGATIVE
Glucose, UA: NEGATIVE mg/dL
Hgb urine dipstick: NEGATIVE
Ketones, ur: NEGATIVE mg/dL
Leukocytes,Ua: NEGATIVE
Nitrite: NEGATIVE
Protein, ur: NEGATIVE mg/dL
Specific Gravity, Urine: 1.001 — ABNORMAL LOW (ref 1.005–1.030)
pH: 6 (ref 5.0–8.0)

## 2023-06-09 MED ORDER — BENZONATATE 100 MG PO CAPS
200.0000 mg | ORAL_CAPSULE | Freq: Once | ORAL | Status: AC
  Administered 2023-06-09: 200 mg via ORAL
  Filled 2023-06-09: qty 2

## 2023-06-09 MED ORDER — POTASSIUM CHLORIDE 10 MEQ/100ML IV SOLN
10.0000 meq | INTRAVENOUS | Status: AC
  Administered 2023-06-09: 10 meq via INTRAVENOUS
  Filled 2023-06-09 (×3): qty 100

## 2023-06-09 MED ORDER — POTASSIUM CHLORIDE CRYS ER 20 MEQ PO TBCR
40.0000 meq | EXTENDED_RELEASE_TABLET | Freq: Once | ORAL | Status: AC
  Administered 2023-06-09: 40 meq via ORAL
  Filled 2023-06-09: qty 2

## 2023-06-09 MED ORDER — SODIUM CHLORIDE 0.9 % IV BOLUS
1000.0000 mL | Freq: Once | INTRAVENOUS | Status: AC
  Administered 2023-06-09: 1000 mL via INTRAVENOUS

## 2023-06-09 MED ORDER — IOHEXOL 350 MG/ML SOLN
75.0000 mL | Freq: Once | INTRAVENOUS | Status: AC | PRN
  Administered 2023-06-09: 75 mL via INTRAVENOUS

## 2023-06-09 MED ORDER — PREDNISONE 10 MG PO TABS: 40.0000 mg | ORAL_TABLET | Freq: Every day | ORAL | 0 refills | Status: AC
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
## 2023-06-09 NOTE — Discharge Instructions (Addendum)
You are seen in the emergency department for shortness of breath.  Thankfully your labs and imaging are reassuring no signs of pulmonary [place] or pneumonia.  There is a noted rib fracture in the fifth rib on the right side. This is not likely causing your shortness of breath, but may cause some pain when taking deep breaths in. I have sent prednisone to your pharmacy given prolonged course of your symptoms. If symptoms worsen, return to the ER.
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
## 2023-06-11 ENCOUNTER — Other Ambulatory Visit: Payer: Self-pay | Admitting: Family

## 2023-06-11 ENCOUNTER — Telehealth: Payer: Self-pay

## 2023-06-11 DIAGNOSIS — Z1211 Encounter for screening for malignant neoplasm of colon: Secondary | ICD-10-CM

## 2023-06-11 DIAGNOSIS — Z1212 Encounter for screening for malignant neoplasm of rectum: Secondary | ICD-10-CM

## 2023-06-11 NOTE — Transitions of Care (Post Inpatient/ED Visit) (Signed)
Unable to reach patient by phone and left v/m requesting call back at [phone].        06/11/2023  Name: Judith Drake MRN: [id number] DOB: [birth year]  Today's TOC FU Call Status: Today's TOC FU Call Status:: Unsuccessful Call (1st Attempt) Unsuccessful Call (1st Attempt) Date: 06/11/23  Attempted to reach the patient regarding the most recent Inpatient/ED visit.  Follow Up Plan: Additional outreach attempts will be made to reach the patient to complete the Transitions of Care (Post Inpatient/ED visit) call.   Signature Lewanda Rife, LPN

## 2023-06-16 ENCOUNTER — Ambulatory Visit: Payer: Self-pay | Admitting: Family Medicine

## 2023-07-27 ENCOUNTER — Telehealth: Payer: Self-pay

## 2023-07-27 ENCOUNTER — Ambulatory Visit
Admission: RE | Admit: 2023-07-27 | Discharge: 2023-07-27 | Disposition: A | Discharge status: Home / Self Care | Payer: Self-pay | Source: Ambulatory Visit | Attending: Family Medicine | Admitting: Family Medicine

## 2023-07-27 ENCOUNTER — Ambulatory Visit: Payer: Self-pay

## 2023-07-27 VITALS — BP 131/85 | HR 108 | Temp 98.7°F | Resp 16

## 2023-07-27 DIAGNOSIS — K219 Gastro-esophageal reflux disease without esophagitis: Secondary | ICD-10-CM

## 2023-07-27 DIAGNOSIS — R21 Rash and other nonspecific skin eruption: Secondary | ICD-10-CM

## 2023-07-27 DIAGNOSIS — R499 Unspecified voice and resonance disorder: Secondary | ICD-10-CM

## 2023-07-27 DIAGNOSIS — R490 Dysphonia: Secondary | ICD-10-CM

## 2023-07-27 DIAGNOSIS — F17200 Nicotine dependence, unspecified, uncomplicated: Secondary | ICD-10-CM

## 2023-07-27 DIAGNOSIS — Z636 Dependent relative needing care at home: Secondary | ICD-10-CM

## 2023-07-27 MED ORDER — OMEPRAZOLE 20 MG PO CPDR: 20.0000 mg | DELAYED_RELEASE_CAPSULE | Freq: Every day | ORAL | 0 refills | Status: DC

## 2023-07-27 MED ORDER — CETIRIZINE HCL 10 MG PO TABS: 10.0000 mg | ORAL_TABLET | Freq: Every day | ORAL | 0 refills | Status: DC

## 2023-07-27 MED ORDER — PREDNISONE 20 MG PO TABS: 40.0000 mg | ORAL_TABLET | Freq: Every day | ORAL | 0 refills | Status: DC

## 2023-07-27 MED ORDER — PREDNISONE 20 MG PO TABS: ORAL_TABLET | ORAL | 0 refills | Status: DC

## 2023-07-27 NOTE — ED Provider Notes (Signed)
[facility]    CSN: [id number] Arrival date & time: 07/27/23  1341      History   Chief Complaint No chief complaint on file.   HPI Judith Drake is a 52 y.o. female.   HPI  Patient is here for a couple of problems.  First she has hoarseness.  Is been going on for 2 months.  It is unremitting.  She states that her voice sometimes is better than others, but she consistently sounds hoarse.  She denies any postnasal drip.  She states she has GERD and sometimes she feels acid when she tries to sleep at night.  She is not on any treatment for GERD.  Her only medicine is Norvasc.  Another problem is a rash.  She has a rash on her chest and back.  Rash also up on her scalp.  It is very rough.  She has never had it before.  It does not itch but it is uncomfortable for her.  Past Medical History:  Diagnosis Date   Abnormal uterine bleeding (AUB) 03/20/2019   Acute lower UTI 05/12/2019   Acute respiratory failure with hypoxia (HCC) 04/10/2018   Anemia    Arthritis    lower back and hips   Dysmenorrhea 03/20/2019   Elevated liver enzymes 11/24/2013   ETOH abuse    Fatty liver 10/03/2019   Fibroid    GERD (gastroesophageal reflux disease)    Heavy menstrual period    Hypertension    Menorrhagia 12/13/2017   SIRS (systemic inflammatory response syndrome) (HCC) 05/12/2019    Patient Active Problem List   Diagnosis Date Noted   Closed right ankle fracture 03/24/2022   Vitamin D deficiency 01/21/2022   HLD (hyperlipidemia) 01/21/2022   TIA (transient ischemic attack) 01/16/2022   OAB (overactive bladder) 01/14/2022   Hand pain 07/19/2020   Dysplasia of cervix, 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   Status post hysterectomy 12/19/2019   Elevated transaminase level 12/18/2019   Hypomagnesemia    Nonallopathic lesion of sacral region 03/28/2018   Nonallopathic lesion of thoracic region 03/28/2018   Nonallopathic lesion of lumbosacral region 03/28/2018   Nonallopathic lesion of  pelvic region 03/28/2018   Nonallopathic lesion of cervical region 03/28/2018   Enlarged thyroid 03/07/2018   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   Lumbar radiculopathy, right 02/08/2018   Anxiety and depression 01/03/2018   Rash 01/03/2018   Right hip pain 01/03/2018   GERD (gastroesophageal reflux disease) 12/13/2017   Hypokalemia 12/13/2017   Depression 11/24/2013   Fatigue 07/12/2013   HTN (hypertension) 07/12/2013   Alcohol abuse 07/12/2013   Tobacco abuse 07/12/2013   Yeast infection of the skin 07/12/2013   Routine general medical examination at a health care facility 07/12/2013    Past Surgical History:  Procedure Laterality Date   BACK SURGERY     CYSTOSCOPY N/A 12/19/2019   Procedure: CYSTOSCOPY;  Surgeon: [place] Bing, MD;  Location: [facility] OR;  Service: Gynecology;  Laterality: N/A;   ELBOW SURGERY     1997    ORIF ANKLE FRACTURE Right 03/25/2022   Procedure: OPEN REDUCTION INTERNAL FIXATION (ORIF) ANKLE FRACTURE;  Surgeon: Cammy Copa, MD;  Location: [facility] OR;  Service: Orthopedics;  Laterality: Right;   TOTAL LAPAROSCOPIC HYSTERECTOMY WITH SALPINGECTOMY Bilateral 12/19/2019   Procedure: TOTAL LAPAROSCOPIC HYSTERECTOMY WITH BILATERAL SALPINGECTOMY;  Surgeon: Ayden Bing, MD;  Location: [facility] OR;  Service: Gynecology;  Laterality: Bilateral;   TUBAL LIGATION      OB History  Gravida  2   Para  2   Term  2   Preterm      AB      Living  2      SAB      IAB      Ectopic      Multiple      Live Births  2        Obstetric Comments  Vaginal x 2          Home Medications    Prior to Admission medications   Medication Sig Start Date End Date Taking? Authorizing Provider  cetirizine (ZYRTEC) 10 MG tablet Take 1 tablet (10 mg total) by mouth daily. 07/27/23  Yes Eustace Moore, MD  omeprazole (PRILOSEC) 20 MG capsule Take 1 capsule (20 mg total) by mouth daily. 07/27/23  Yes Eustace Moore, MD  amLODipine  (NORVASC) 10 MG tablet TAKE ONE TABLET BY MOUTH ONCE DAILY 12/14/22   Allegra Grana, FNP  predniSONE (DELTASONE) 20 MG tablet Take 1 pill 3 times a day for 3 days, take 1 pill 2 times a day for 5 days, 1 pill 1 time a day for 5 days, then discontinue 07/27/23   Eustace Moore, MD    Family History Family History  Problem Relation Age of Onset   Cancer Mother 88       Lung    Hyperlipidemia Mother    Hypertension Mother    Stroke Mother    Kidney disease Mother    Diabetes Mother    Heart disease Mother    Depression Mother    Hyperlipidemia Father    Cancer Paternal Grandfather 49       Colon Cancer - died   Autism Son    Breast cancer Neg Hx     Social History Social History   Tobacco Use   Smoking status: Some Days    Current packs/day: 0.50    Types: Cigarettes   Smokeless tobacco: Never  Vaping Use   Vaping status: Never Used  Substance Use Topics   Alcohol use: Not Currently    Comment: weekly   Drug use: No     Allergies   Amoxicillin, Dilaudid [hydromorphone hcl], and Morphine and codeine   Review of Systems Review of Systems  See HPI Physical Exam Triage Vital Signs ED Triage Vitals  Encounter Vitals Group     BP 07/27/23 1358 131/85     Systolic BP Percentile --      Diastolic BP Percentile --      Pulse Rate 07/27/23 1358 (!) 108     Resp 07/27/23 1358 16     Temp 07/27/23 1358 98.7 F (37.1 C)     Temp src --      SpO2 07/27/23 1358 98 %     Weight --      Height --      Head Circumference --      Peak Flow --      Pain Score 07/27/23 1444 3     Pain Loc --      Pain Education --      Exclude from Growth Chart --    No data found.  Updated Vital Signs BP 131/85   Pulse (!) 108   Temp 98.7 F (37.1 C)   Resp 16   LMP  (LMP Unknown)   SpO2 98%      Physical Exam Constitutional:      General: She is  not in acute distress.    Appearance: She is well-developed.     Comments: Pleasant.  Mildly overweight.  Hoarse voice   HENT:     Head: Normocephalic and atraumatic.     Right Ear: Tympanic membrane and ear canal normal.     Left Ear: Tympanic membrane and ear canal normal.     Nose: Nose normal. No congestion.     Mouth/Throat:     Mouth: Mucous membranes are moist.     Pharynx: No posterior oropharyngeal erythema.  Eyes:     Conjunctiva/sclera: Conjunctivae normal.     Pupils: Pupils are equal, round, and reactive to light.  Cardiovascular:     Rate and Rhythm: Normal rate.     Heart sounds: Normal heart sounds.  Pulmonary:     Effort: Pulmonary effort is normal. No respiratory distress.     Breath sounds: Normal breath sounds.  Abdominal:     General: There is no distension.     Palpations: Abdomen is soft.  Musculoskeletal:        General: Normal range of motion.     Cervical back: Normal range of motion.  Lymphadenopathy:     Cervical: No cervical adenopathy.  Skin:    General: Skin is warm and dry.     Findings: Rash present.     Comments: Extensive dermatitis on the chest and back with rough excoriated skin difficult to determine.  Neurological:     Mental Status: She is alert.      UC Treatments / Results  Labs (all labs ordered are listed, but only abnormal results are displayed) Labs Reviewed - No data to display  EKG   Radiology No results found.  Procedures Procedures (including critical care time)  Medications Ordered in UC Medications - No data to display  Initial Impression / Assessment and Plan / UC Course  I have reviewed the triage vital signs and the nursing notes.  Pertinent labs & imaging results that were available during my care of the patient were reviewed by me and considered in my medical decision making (see chart for details).    I have significant concern with persistent hoarseness and a smoker, drinker, without alternate etiology.  Hopefully its postnasal drip or GERD.  If she fails treatment for these conditions, she does need a fiberoptic  scope.  I contacted her primary care doctor to make sure she gets appropriate follow-up. Final Clinical Impressions(s) / UC Diagnoses   Final diagnoses:  Hoarseness or changing voice  Gastroesophageal reflux disease, unspecified whether esophagitis present  Rash and nonspecific skin eruption  Caregiver stress  Tobacco dependence     Discharge Instructions      Take the omeprazole daily.  This will help reduce stomach acid.  In addition reduce spicy and fried foods, and alcohol.  Elevate the head of your bed.  You want to reduce your acid reflux  Take the cetirizine daily.  This will help reduce any postnasal drip.  Take the prednisone as directed.  You will take 60 a day for 3 days, then 40 a day for 5 days, then 20 a day for 5 days.  Hopefully this is enough to help with your rash.  In addition you should use a good lotion twice a day (as much as you can reach)  Call your PCP office to see if you can be seen sooner than February.  I will send your doctor note   ED Prescriptions     Medication  Sig Dispense Auth. Provider   omeprazole (PRILOSEC) 20 MG capsule Take 1 capsule (20 mg total) by mouth daily. 30 capsule Eustace Moore, MD   cetirizine (ZYRTEC) 10 MG tablet Take 1 tablet (10 mg total) by mouth daily. 30 tablet Eustace Moore, MD   predniSONE (DELTASONE) 20 MG tablet  (Status: Discontinued) Take 2 tablets (40 mg total) by mouth daily with breakfast. 10 tablet Eustace Moore, MD   predniSONE (DELTASONE) 20 MG tablet Take 1 pill 3 times a day for 3 days, take 1 pill 2 times a day for 5 days, 1 pill 1 time a day for 5 days, then discontinue 21 tablet Eustace Moore, MD      PDMP not reviewed this encounter.   Eustace Moore, MD 07/27/23 [phone]

## 2023-07-27 NOTE — Telephone Encounter (Signed)
LVM to call back to speak to [facility]

## 2023-07-27 NOTE — ED Triage Notes (Signed)
Pt presents to uc with co of sores on top of scalp, Pt endorses rash ( red small macules and uncomfortable) on chest and back with loss of voice for 2 months. Pt reports she had some prednisone at home and took that and it helped some but she has finished this and still having the rash.   No  changes in lotions or detergents no family members have rash.

## 2023-07-27 NOTE — Discharge Instructions (Addendum)
Take the omeprazole daily.  This will help reduce stomach acid.  In addition reduce spicy and fried foods, and alcohol.  Elevate the head of your bed.  You want to reduce your acid reflux  Take the cetirizine daily.  This will help reduce any postnasal drip.  Take the prednisone as directed.  You will take 60 a day for 3 days, then 40 a day for 5 days, then 20 a day for 5 days.  Hopefully this is enough to help with your rash.  In addition you should use a good lotion twice a day (as much as you can reach)  Call your PCP office to see if you can be seen sooner than February.  I will send your doctor note

## 2023-07-28 NOTE — Telephone Encounter (Signed)
Judith Drake   This patient is on your schedule in a couple of weeks.  I received a note from the physician who saw her in urgent care in regards to her hoarseness and concern for potential esophageal cancer and need for fiberoptic scope .  [facility] team Call patient Please let patient know that ahead of seeing Baird Lyons, I have gone ahead and placed a ENT consult for hoarseness.  We will call her to schedule this appointment.  I do not want to delay her care.   Does she have any pain or trouble with swallowing?  She may also benefit from a gastroenterology referral for discussion of EGD, colonoscopy.  Screening for colon cancer is due.

## 2023-07-28 NOTE — Addendum Note (Signed)
Addended by: Allegra Grana on: 07/28/2023 10:51 AM   Modules accepted: Orders

## 2023-08-10 ENCOUNTER — Encounter: Payer: Self-pay | Admitting: Nurse Practitioner

## 2023-08-10 ENCOUNTER — Ambulatory Visit (INDEPENDENT_AMBULATORY_CARE_PROVIDER_SITE_OTHER): Payer: Self-pay | Admitting: Nurse Practitioner

## 2023-08-10 VITALS — BP 122/84 | HR 101 | Temp 99.1°F | Ht 63.0 in | Wt 164.0 lb

## 2023-08-10 DIAGNOSIS — R490 Dysphonia: Secondary | ICD-10-CM | POA: Insufficient documentation

## 2023-08-10 DIAGNOSIS — R21 Rash and other nonspecific skin eruption: Secondary | ICD-10-CM

## 2023-08-10 DIAGNOSIS — R252 Cramp and spasm: Secondary | ICD-10-CM | POA: Insufficient documentation

## 2023-08-10 DIAGNOSIS — I10 Essential (primary) hypertension: Secondary | ICD-10-CM

## 2023-08-10 DIAGNOSIS — K219 Gastro-esophageal reflux disease without esophagitis: Secondary | ICD-10-CM

## 2023-08-10 DIAGNOSIS — B370 Candidal stomatitis: Secondary | ICD-10-CM | POA: Insufficient documentation

## 2023-08-10 MED ORDER — NYSTATIN 100000 UNIT/ML MT SUSP
5.0000 mL | Freq: Four times a day (QID) | OROMUCOSAL | 0 refills | Status: DC
Start: 2023-08-10 — End: 2023-11-05

## 2023-08-10 MED ORDER — AMLODIPINE BESYLATE 10 MG PO TABS: 10.0000 mg | ORAL_TABLET | Freq: Every day | ORAL | 3 refills | Status: DC

## 2023-08-10 MED ORDER — OMEPRAZOLE 20 MG PO CPDR
20.0000 mg | DELAYED_RELEASE_CAPSULE | Freq: Every day | ORAL | 1 refills | Status: AC
Start: 2023-08-10 — End: ?

## 2023-08-10 NOTE — Assessment & Plan Note (Signed)
They report cramps in their feet and legs, especially at night, possibly associated with inflammatory arthritis. We will order labs as outlined and contact patient with results. Further work up pending results.

## 2023-08-10 NOTE — Assessment & Plan Note (Signed)
Symptoms and lesions noted on tongue consistent with thrush. Will treat with Nystatin suspension.

## 2023-08-10 NOTE — Progress Notes (Signed)
Bethanie Dicker, NP-C Phone: [phone]  Judith Drake is a 52 y.o. female who presents today for hoarseness.   Discussed the use of AI scribe software for clinical note transcription with the patient, who gave verbal consent to proceed.  History of Present Illness   The patient presents with a persistent hoarse voice, which has not improved despite treatment for postnasal drip and acid reflux with omeprazole. The patient reports that the hoarseness worsens as the day progresses, often leading to complete voice loss by evening. Despite the hoarseness, the patient has experienced significant improvement in acid reflux symptoms since starting omeprazole, which they plan to continue.  In addition to the hoarseness, the patient has been dealing with a severe rash that started in late September. The rash, which was initially widespread across the chest and back, has improved significantly after a course of prednisone but has not completely resolved. The patient reports that the rash is now drying and scabbing over, but remains uncomfortable.  The patient also reports a raw sensation on the tongue, with visible bumps, but denies any sore throat or difficulty swallowing. They have been experiencing severe cramps in the feet and legs, particularly at night, which have been disrupting their sleep. The patient also mentions occasional hand cramping, which they attribute to a history of inflammatory arthritis.  The patient has a history of anemia prior to a hysterectomy, and reports ongoing fatigue. They are currently taking blood pressure medication, for which they need refills. The patient is scheduled to see an ENT specialist in mid-December for further evaluation of the hoarseness. They are currently without health insurance and are paying out-of-pocket for medical expenses.      Social History   Tobacco Use  Smoking Status Some Days   Current packs/day: 0.50   Types: Cigarettes  Smokeless  Tobacco Never    Current Outpatient Medications on File Prior to Visit  Medication Sig Dispense Refill   cetirizine (ZYRTEC) 10 MG tablet Take 1 tablet (10 mg total) by mouth daily. 30 tablet 0   No current facility-administered medications on file prior to visit.    ROS see history of present illness  Objective  Physical Exam Vitals:   08/10/23 1342  BP: 122/84  Pulse: (!) 101  Temp: 99.1 F (37.3 C)  SpO2: 98%    BP Readings from Last 3 Encounters:  08/10/23 122/84  07/27/23 131/85  06/09/23 (!) 142/76   Wt Readings from Last 3 Encounters:  08/10/23 164 lb (74.4 kg)  06/09/23 165 lb (74.8 kg)  03/25/22 165 lb 5.5 oz (75 kg)    Physical Exam Constitutional:      General: She is not in acute distress.    Appearance: Normal appearance.  HENT:     Head: Normocephalic.     Mouth/Throat:     Mouth: Mucous membranes are moist.     Tongue: Lesions present.     Pharynx: Oropharynx is clear.  Cardiovascular:     Rate and Rhythm: Normal rate and regular rhythm.     Heart sounds: Normal heart sounds.  Pulmonary:     Effort: Pulmonary effort is normal.     Breath sounds: Normal breath sounds.  Skin:    General: Skin is warm and dry.     Findings: Rash (dermatitis- healing, small scabbed areas noted across back) present.  Neurological:     General: No focal deficit present.     Mental Status: She is alert.  Psychiatric:  Mood and Affect: Mood normal.        Behavior: Behavior normal.    Assessment/Plan: Please see individual problem list.  Hoarseness of voice Assessment & Plan: Despite treatment for postnasal drip and acid reflux, their hoarseness persists. We will continue Omeprazole for acid reflux and they have an ENT consultation scheduled for 08/23/2023.  Orders: -     CBC with Differential/Platelet  Muscle cramps at night Assessment & Plan: They report cramps in their feet and legs, especially at night, possibly associated with inflammatory  arthritis. We will order labs as outlined and contact patient with results. Further work up pending results.   Orders: -     CBC with Differential/Platelet -     Comprehensive metabolic panel -     Sedimentation rate -     Magnesium -     CK  Rash Assessment & Plan: Their rash has improved with Prednisone but is not completely resolved. We will monitor the rash and continue Zyrtec daily.    Oral thrush Assessment & Plan: Symptoms and lesions noted on tongue consistent with thrush. Will treat with Nystatin suspension.   Orders: -     Nystatin; Take 5 mLs (500,000 Units total) by mouth 4 (four) times daily. X 7 days. Swish around mouth and retain for as long as possible before swallowing.  Dispense: 473 mL; Refill: 0  Gastroesophageal reflux disease without esophagitis Assessment & Plan: Their symptoms have improved with Omeprazole, which we will continue. Counseled on decreasing caffeine and alcohol intake and avoiding spicy and acidic foods. Dietary information provided to patient.   Orders: -     Omeprazole; Take 1 capsule (20 mg total) by mouth daily.  Dispense: 90 capsule; Refill: 1  Essential hypertension Assessment & Plan: Stable today in office, taking Norvasc 10 mg daily. Continue. Refills sent.   Orders: -     amLODIPine Besylate; Take 1 tablet (10 mg total) by mouth daily.  Dispense: 90 tablet; Refill: 3   Return if symptoms worsen or fail to improve.   Bethanie Dicker, NP-C [facility] - [employer]

## 2023-08-10 NOTE — Assessment & Plan Note (Signed)
Their rash has improved with Prednisone but is not completely resolved. We will monitor the rash and continue Zyrtec daily.

## 2023-08-10 NOTE — Assessment & Plan Note (Signed)
Stable today in office, taking Norvasc 10 mg daily. Continue. Refills sent.

## 2023-08-10 NOTE — Assessment & Plan Note (Signed)
Their symptoms have improved with Omeprazole, which we will continue. Counseled on decreasing caffeine and alcohol intake and avoiding spicy and acidic foods. Dietary information provided to patient.

## 2023-08-10 NOTE — Assessment & Plan Note (Signed)
Despite treatment for postnasal drip and acid reflux, their hoarseness persists. We will continue Omeprazole for acid reflux and they have an ENT consultation scheduled for 08/23/2023.

## 2023-08-11 ENCOUNTER — Other Ambulatory Visit: Payer: Self-pay | Admitting: Nurse Practitioner

## 2023-08-11 ENCOUNTER — Other Ambulatory Visit (INDEPENDENT_AMBULATORY_CARE_PROVIDER_SITE_OTHER): Payer: Self-pay

## 2023-08-11 DIAGNOSIS — D649 Anemia, unspecified: Secondary | ICD-10-CM

## 2023-08-11 DIAGNOSIS — R739 Hyperglycemia, unspecified: Secondary | ICD-10-CM

## 2023-08-11 LAB — CBC WITH DIFFERENTIAL/PLATELET
Basophils Absolute: 0.1 10*3/uL (ref 0.0–0.1)
Basophils Relative: 0.5 % (ref 0.0–3.0)
Eosinophils Absolute: 0 10*3/uL (ref 0.0–0.7)
Eosinophils Relative: 0.4 % (ref 0.0–5.0)
HCT: 34 % — ABNORMAL LOW (ref 36.0–46.0)
Hemoglobin: 10.4 g/dL — ABNORMAL LOW (ref 12.0–15.0)
Lymphocytes Relative: 16.2 % (ref 12.0–46.0)
Lymphs Abs: 1.7 10*3/uL (ref 0.7–4.0)
MCHC: 30.8 g/dL (ref 30.0–36.0)
MCV: 74.5 fL — ABNORMAL LOW (ref 78.0–100.0)
Monocytes Absolute: 0.6 10*3/uL (ref 0.1–1.0)
Monocytes Relative: 6 % (ref 3.0–12.0)
Neutro Abs: 8.3 10*3/uL — ABNORMAL HIGH (ref 1.4–7.7)
Neutrophils Relative %: 76.9 % (ref 43.0–77.0)
Platelets: 507 10*3/uL — ABNORMAL HIGH (ref 150.0–400.0)
RBC: 4.56 Mil/uL (ref 3.87–5.11)
RDW: 17.9 % — ABNORMAL HIGH (ref 11.5–15.5)
WBC: 10.7 10*3/uL — ABNORMAL HIGH (ref 4.0–10.5)

## 2023-08-11 LAB — COMPREHENSIVE METABOLIC PANEL
ALT: 34 U/L (ref 0–35)
AST: 32 U/L (ref 0–37)
Albumin: 4.5 g/dL (ref 3.5–5.2)
Alkaline Phosphatase: 66 U/L (ref 39–117)
BUN: 14 mg/dL (ref 6–23)
CO2: 32 meq/L (ref 19–32)
Calcium: 9.9 mg/dL (ref 8.4–10.5)
Chloride: 95 meq/L — ABNORMAL LOW (ref 96–112)
Creatinine, Ser: 0.71 mg/dL (ref 0.40–1.20)
GFR: 98.56 mL/min (ref >60.00)
Glucose, Bld: 167 mg/dL — ABNORMAL HIGH (ref 70–99)
Potassium: 4.6 meq/L (ref 3.5–5.1)
Sodium: 137 meq/L (ref 135–145)
Total Bilirubin: 0.6 mg/dL (ref 0.2–1.2)
Total Protein: 7.8 g/dL (ref 6.0–8.3)

## 2023-08-11 LAB — IBC + FERRITIN
Ferritin: 11.8 ng/mL (ref 10.0–291.0)
Iron: 38 ug/dL — ABNORMAL LOW (ref 42–145)
Saturation Ratios: 6 % — ABNORMAL LOW (ref 20.0–50.0)
TIBC: 638.4 ug/dL — ABNORMAL HIGH (ref 250.0–450.0)
Transferrin: 456 mg/dL — ABNORMAL HIGH (ref 212.0–360.0)

## 2023-08-11 LAB — HEMOGLOBIN A1C: Hgb A1c MFr Bld: 7.2 % — ABNORMAL HIGH (ref 4.6–6.5)

## 2023-08-11 LAB — CK: Total CK: 72 U/L (ref 7–177)

## 2023-08-11 LAB — SEDIMENTATION RATE: Sed Rate: 34 mm/h — ABNORMAL HIGH (ref 0–30)

## 2023-08-11 LAB — MAGNESIUM: Magnesium: 1.8 mg/dL (ref 1.5–2.5)

## 2023-08-13 ENCOUNTER — Telehealth: Payer: Self-pay

## 2023-08-13 NOTE — Telephone Encounter (Signed)
LVM to call back to schedule appt in office to see Judith Drake to review labs. Pt is anemic and A1c reveals DM

## 2023-08-17 ENCOUNTER — Ambulatory Visit (INDEPENDENT_AMBULATORY_CARE_PROVIDER_SITE_OTHER): Payer: Self-pay | Admitting: Family

## 2023-08-17 ENCOUNTER — Encounter: Payer: Self-pay | Admitting: Family

## 2023-08-17 VITALS — BP 134/84 | HR 78 | Temp 98.4°F | Ht 63.0 in | Wt 163.2 lb

## 2023-08-17 DIAGNOSIS — Z7984 Long term (current) use of oral hypoglycemic drugs: Secondary | ICD-10-CM

## 2023-08-17 DIAGNOSIS — D500 Iron deficiency anemia secondary to blood loss (chronic): Secondary | ICD-10-CM

## 2023-08-17 DIAGNOSIS — F1010 Alcohol abuse, uncomplicated: Secondary | ICD-10-CM

## 2023-08-17 DIAGNOSIS — F32A Depression, unspecified: Secondary | ICD-10-CM

## 2023-08-17 DIAGNOSIS — E119 Type 2 diabetes mellitus without complications: Secondary | ICD-10-CM

## 2023-08-17 DIAGNOSIS — D509 Iron deficiency anemia, unspecified: Secondary | ICD-10-CM | POA: Insufficient documentation

## 2023-08-17 DIAGNOSIS — F419 Anxiety disorder, unspecified: Secondary | ICD-10-CM

## 2023-08-17 DIAGNOSIS — H539 Unspecified visual disturbance: Secondary | ICD-10-CM

## 2023-08-17 DIAGNOSIS — Z1231 Encounter for screening mammogram for malignant neoplasm of breast: Secondary | ICD-10-CM

## 2023-08-17 DIAGNOSIS — R21 Rash and other nonspecific skin eruption: Secondary | ICD-10-CM

## 2023-08-17 DIAGNOSIS — R490 Dysphonia: Secondary | ICD-10-CM

## 2023-08-17 LAB — URINALYSIS, ROUTINE W REFLEX MICROSCOPIC
Hgb urine dipstick: NEGATIVE
Leukocytes,Ua: NEGATIVE
Nitrite: NEGATIVE
Specific Gravity, Urine: 1.02 (ref 1.000–1.030)
Total Protein, Urine: 30 — AB
Urine Glucose: NEGATIVE
Urobilinogen, UA: 1 (ref 0.0–1.0)
pH: 7 (ref 5.0–8.0)

## 2023-08-17 MED ORDER — MUPIROCIN 2 % EX OINT: 1.0000 | TOPICAL_OINTMENT | Freq: Two times a day (BID) | CUTANEOUS | 2 refills | Status: DC

## 2023-08-17 MED ORDER — FERROUS SULFATE 325 (65 FE) MG PO TBEC
325.0000 mg | DELAYED_RELEASE_TABLET | Freq: Every day | ORAL | 0 refills | Status: DC
Start: 2023-08-17 — End: 2024-03-01

## 2023-08-17 MED ORDER — DULOXETINE HCL 30 MG PO CPEP
30.0000 mg | ORAL_CAPSULE | Freq: Every day | ORAL | 1 refills | Status: DC
Start: 2023-08-17 — End: 2023-09-23

## 2023-08-17 MED ORDER — TRIAMCINOLONE ACETONIDE 0.025 % EX CREA
1.0000 | TOPICAL_CREAM | Freq: Two times a day (BID) | CUTANEOUS | 0 refills | Status: DC
Start: 2023-08-17 — End: 2023-11-05

## 2023-08-17 NOTE — Assessment & Plan Note (Signed)
Reassuring neurologic exam today.  Discussed with patient differential would include new onset diabetes.  More worrisome differentials include CVA, retinal detachment .  Patient has a history of a TIA.  She politely declines pursuing neuroimaging at this time.  Counseled on alarm features including any further obscuring vision or vision loss which would warrant calling 911 or presenting to nearest emergency room.

## 2023-08-17 NOTE — Progress Notes (Signed)
Assessment & Plan:  Iron deficiency anemia due to chronic blood loss Assessment & Plan: Status post hysterectomy.  Presentation consistent with microcytic anemia, in the setting of iron deficiency.  Start ferrous sulfate.  Discussed NSAIDs, alcohol use and risk for gastritis.  Advised to continue omeprazole and to stop Goody's powder.  If hemoglobin does not improve at follow-up, will advise gastroenterology consult.  Orders: -     Ferrous Sulfate; Take 1 tablet (325 mg total) by mouth daily with breakfast.  Dispense: 90 tablet; Refill: 0 -     Urinalysis, Routine w reflex microscopic  Encounter for screening mammogram for malignant neoplasm of breast -     3D Screening Mammogram, Left and Right; Future  Rash Assessment & Plan: Afebrile, nontoxic in appearance.  Differential diagnoses includes pityriasis rosea, nummular eczema,  acne vulgaris, infectious folliculitis.  Improved with prednisone.  Newly diabetic and opted not to prescribe oral prednisone at this time.Start Bactroban to treat for coexisting bacterial component and triamcinolone for itching.  Close follow-up.  Orders: -     HSV(herpes simplex vrs) 1+2 ab-IgG -     RPR -     Mupirocin; Apply 1 Application topically 2 (two) times daily.  Dispense: 22 g; Refill: 2 -     Triamcinolone Acetonide; Apply 1 Application topically 2 (two) times daily.  Dispense: 15 g; Refill: 0  Alcohol abuse Assessment & Plan: Discussed alcohol use.  Consider AAA, outpatient rehab at follow up.    Anxiety and depression Assessment & Plan: Discussed marital concerns and safety.  She is currently living with her father.  She takes care of disabled son.  Start Cymbalta 30 mg daily.  Orders: -     DULoxetine HCl; Take 1 capsule (30 mg total) by mouth daily.  Dispense: 90 capsule; Refill: 1  Vision changes Assessment & Plan: Reassuring neurologic exam today.  Discussed with patient differential would include new onset diabetes.  More worrisome  differentials include CVA, retinal detachment .  Patient has a history of a TIA.  She politely declines pursuing neuroimaging at this time.  Counseled on alarm features including any further obscuring vision or vision loss which would warrant calling 911 or presenting to nearest emergency room.  Orders: -     Ambulatory referral to Ophthalmology  Hoarseness of voice Assessment & Plan: Largely unchanged.  Awaiting appointment with ENT   Type 2 diabetes mellitus without complication, without long-term current use of insulin [facility]) Assessment & Plan: Lab Results  Component Value Date   HGBA1C 7.2 (H) 08/11/2023   New onset diabetes.  Discussed low glycemic diet.  Recent prednisone use.  Recommended metformin 500 mg twice daily. Will follow.       Return precautions given.   Risks, benefits, and alternatives of the medications and treatment plan prescribed today were discussed, and patient expressed understanding.   Education regarding symptom management and diagnosis given to patient on AVS either electronically or printed.  Return in about 1 month (around 09/17/2023).  Rennie Plowman, FNP I have spent 40 minutes with a patient including precharting, exam, reviewing medical records, labs, and discussion plan of care diabetes, rash, vision changes, hoarseness.     Subjective:    Patient ID: Judith Drake, female    DOB: June 18, 1971, 52 y.o.   MRN: [id number]  CC: Judith Drake is a 52 y.o. female who presents today for follow up.   HPI: She has noted occassional right spots in her vision. At random.  It can be gold or glimmer. She noted vision has become worse over the last year and night driving and seeing at a distance is more 'blurry'.    No associated  eye pain, vision loss, HA, N, V, numbness, confusion.   No contacts.   She wears reading glasses  Last eye exam 1 year ago; following with [facility].  She was referred to [facility] due to increased  intraocular pressure at that time      Endorses dietary indiscretion, candy bar at dinner.  She is drinking sprite and cranberry juice.   [facility] for itchy and painful diffuse rash over back 2 months ago.  No fever.  Improvement with prednisone. Scabbed over now. No lesions in mouth, hands.    Scheduled to see [facility] in regards to hoarseness, 08/23/23  Denies rectal bleeding, changes in bowel habits, hematuria  S/p hysterectomy   Appetite good;she is not vegetarian.   Drinks alcohol most days of the week. She drinks in the afternoon to help with energy.   She drinks vodka and cranberry, approx 6 -8 mini bottles per day.  No tremors, blacking out. She will stop drinking for a few days and earlier this year, she stopped drinking for 8 months. She started drinking since 03/2023.   Takes NSAIDs daily.   She started prilosec OTC  Her husband was in jail 2 days ago. She and her son are staying with her dad.     Ferritin 11.8 A1c 7.2 Hbg 10.4 MCV 74.5       Allergies: Amoxicillin, Dilaudid [hydromorphone hcl], and Morphine and codeine Current Outpatient Medications on File Prior to Visit  Medication Sig Dispense Refill   amLODipine (NORVASC) 10 MG tablet Take 1 tablet (10 mg total) by mouth daily. 90 tablet 3   nystatin (MYCOSTATIN) 100000 UNIT/ML suspension Take 5 mLs (500,000 Units total) by mouth 4 (four) times daily. X 7 days. Swish around mouth and retain for as long as possible before swallowing. 473 mL 0   omeprazole (PRILOSEC) 20 MG capsule Take 1 capsule (20 mg total) by mouth daily. 90 capsule 1   cetirizine (ZYRTEC) 10 MG tablet Take 1 tablet (10 mg total) by mouth daily. (Patient not taking: Reported on 08/17/2023) 30 tablet 0   No current facility-administered medications on file prior to visit.    Review of Systems  Constitutional:  Negative for chills and fever.  Eyes:  Positive for visual disturbance. Negative for photophobia and  pain.  Respiratory:  Negative for cough.   Cardiovascular:  Negative for chest pain and palpitations.  Gastrointestinal:  Negative for nausea and vomiting.  Skin:  Positive for rash.  Psychiatric/Behavioral:  Negative for suicidal ideas. The patient is not nervous/anxious.       Objective:    BP 134/84   Pulse 78   Temp 98.4 F (36.9 C) (Oral)   Ht 5\' 3"  (1.6 m)   Wt 163 lb 3.2 oz (74 kg)   LMP  (LMP Unknown)   SpO2 97%   BMI 28.91 kg/m  BP Readings from Last 3 Encounters:  08/17/23 134/84  08/10/23 122/84  07/27/23 131/85   Wt Readings from Last 3 Encounters:  08/17/23 163 lb 3.2 oz (74 kg)  08/10/23 164 lb (74.4 kg)  06/09/23 165 lb (74.8 kg)      08/17/2023   11:05 AM 01/14/2022    3:19 PM 06/23/2019    3:10 PM  Depression screen PHQ 2/9  Decreased  Interest 0 0 0  Down, Depressed, Hopeless 0 0 0  PHQ - 2 Score 0 0 0  Altered sleeping  0 0  Tired, decreased energy  0 0  Change in appetite  0 0  Feeling bad or failure about yourself   0 0  Trouble concentrating  0 0  Moving slowly or fidgety/restless  0 0  Suicidal thoughts  0 0  PHQ-9 Score  0 0  Difficult doing work/chores  Not difficult at all Not difficult at all      Physical Exam Vitals reviewed.  Constitutional:      Appearance: She is well-developed.  HENT:     Mouth/Throat:     Pharynx: Uvula midline.  Eyes:     Conjunctiva/sclera: Conjunctivae normal.     Pupils: Pupils are equal, round, and reactive to light.     Comments: Fundus normal bilaterally.   Cardiovascular:     Rate and Rhythm: Normal rate and regular rhythm.     Pulses: Normal pulses.     Heart sounds: Normal heart sounds.  Pulmonary:     Effort: Pulmonary effort is normal.     Breath sounds: Normal breath sounds. No wheezing, rhonchi or rales.  Skin:    General: Skin is warm and dry.     Comments: Diffuse excoriated scabbed over lesions thoracic back.  No vesicular lesions, purulent discharge.   Neurological:     Mental  Status: She is alert.     Cranial Nerves: No cranial nerve deficit.     Sensory: No sensory deficit.     Deep Tendon Reflexes:     Reflex Scores:      Bicep reflexes are 2+ on the right side and 2+ on the left side.      Patellar reflexes are 2+ on the right side and 2+ on the left side.    Comments: Grip equal and strong bilateral upper extremities. Gait strong and steady. Able to perform rapid alternating movement without difficulty.   Psychiatric:        Speech: Speech normal.        Behavior: Behavior normal.        Thought Content: Thought content normal.

## 2023-08-17 NOTE — Assessment & Plan Note (Addendum)
Afebrile, nontoxic in appearance.  Differential diagnoses includes pityriasis rosea, nummular eczema,  acne vulgaris, infectious folliculitis.  Improved with prednisone.  Newly diabetic and opted not to prescribe oral prednisone at this time.Start Bactroban to treat for coexisting bacterial component and triamcinolone for itching.  Close follow-up.

## 2023-08-17 NOTE — Patient Instructions (Addendum)
[email or web address]  Continue omeprazole  STOP goodys powder as afraid it is causing gastritis/ulcer as discussed.   Start cymbalta   I sent a prescription for a two-month course of iron to see this improves symptoms and low iron stores. Please take your iron with vitamin C for better absorption. If symptoms are not improved, please make office visit for follow-up.  I have sent in two creams to trial for rash over back. Please let me know how you are doing.   Goal of fasting blood sugar is between 70-120. If in this range, we are reaching our target a1c ( goal 6.5%)   Please check fasting blood sugar in the morning time once or twice a week.  You may also check if you feel like you are having a low episode or particularly high episode of blood sugar.  If blood sugars increase, I may advise you to check blood sugar after your largest meal.  You specifically do this TWO hours after largest meal with the goal of being less than 180.  If blood sugar is checked sooner than 2 hours after largest meal, and it will be  expected to be elevated. You must wait 2 hours.   If your blood sugar is less than 180 hours after your largest meal, again we are reaching our target a1c goal   Call [facility] clinic if: BG < 70 or > 300.   If you have any symptoms of low blood sugar ( sweating, shakiness, lightheaded, dizzy) that you notify me. If you have a low, please drink a glass of orange juice and recheck blood sugar every 5 minutes until you don't feel symptomatic AND blood sugar is above 80.     This is  Dr. Melina Schools  example of a  "Low GI"  Diet:  It will allow you to lose 4 to 8  lbs  per month if you follow it carefully.  Your goal with exercise is a minimum of 30 minutes of aerobic exercise 5 days per week (Walking does not count once it becomes easy!)    All of the foods can be found at grocery stores and in bulk at [employer].  The Atkins protein bars and shakes are available in more varieties at Target, WalMart and Lowe's Foods.     7 AM Breakfast:  Choose from the following:  Low carbohydrate Protein  Shakes (I recommend the  Premier Protein chocolate shakes,  EAS AdvantEdge "Carb Control" shakes  Or the Atkins shakes all are under 3 net carbs)     a scrambled egg/bacon/cheese burrito made with Mission's "carb balance" whole wheat tortilla  (about 10 net carbs )  Medical laboratory scientific officer (basically a quiche without the pastry crust) that is eaten cold and very convenient way to get your eggs.  8 carbs)  If you make your own protein shakes, avoid bananas and pineapple,  And use low carb greek yogurt or original /unsweetened almond or soy milk    Avoid cereal and bananas, oatmeal and cream of wheat and grits. They are loaded with carbohydrates!   10 AM: high protein snack:  Protein bar by Atkins (the snack size, under 200 cal, usually < 6 net carbs).    A stick of cheese:  Around 1 carb,  100 cal     Dannon Light n Fit [place] Yogurt  (80 cal, 8 carbs)  Other so called "protein bars" and Greek yogurts tend to be loaded with carbohydrates.  Remember, in food advertising, the word "energy" is synonymous for " carbohydrate."  Lunch:   A Sandwich using the bread choices listed, Can use any  Eggs,  lunchmeat, grilled meat or canned tuna), avocado, regular mayo/mustard  and cheese.  A Salad using blue cheese, ranch,  Goddess or vinagrette,  Avoid taco shells, croutons or "confetti" and no "candied nuts" but regular nuts OK.   No pretzels, nabs  or chips.  Pickles and miniature sweet peppers are a good low carb alternative that provide a "crunch"  The bread is the only source of carbohydrate in a sandwich and  can be decreased by trying some of the attached alternatives to traditional loaf bread   Avoid "Low fat dressings, as well as Reyne Dumas and [employer] dressings They are loaded with  sugar!   3 PM/ Mid day  Snack:  Consider  1 ounce of  almonds, walnuts, pistachios, pecans, peanuts,  Macadamia nuts or a nut medley.  Avoid "granola and granola bars "  Mixed nuts are ok in moderation as long as there are no raisins,  cranberries or dried fruit.   KIND bars are OK if you get the low glycemic index variety   Try the prosciutto/mozzarella cheese sticks by Fiorruci  In deli /backery section   High protein      6 PM  Dinner:     Meat/fowl/fish with a green salad, and either broccoli, cauliflower, green beans, spinach, brussel sprouts or  Lima beans. DO NOT BREAD THE PROTEIN!!      There is a low carb pasta by Dreamfield's that is acceptable and tastes great: only 5 digestible carbs/serving.( All grocery stores but BJs carry it ) Several ready made meals are available low carb:   Try Michel Angelo's chicken piccata or chicken or eggplant parm over low carb pasta.(Lowes and BJs)   Clifton Custard Sanchez's "Carnitas" (pulled pork, no sauce,  0 carbs) or his beef pot roast to make a dinner burrito (at BJ's)  Pesto over low carb pasta (bj's sells a good quality pesto in the center refrigerated section of the deli   Try satueeing  Roosvelt Harps with mushroooms as a good side   [employer] makes a mashed cauliflower that tastes like mashed potatoes  Whole wheat pasta is still full of digestible carbs and  Not as low in glycemic index as Dreamfield's.   Brown rice is still rice,  So skip the rice and noodles if you eat [place] or [place] (or at least limit to 1/2 cup)  9 PM snack :   Breyer's "low carb" fudgsicle or  ice cream bar (Carb Smart line), or  Weight Watcher's ice cream bar , or another "no sugar added" ice cream;  a serving of fresh berries/cherries with whipped cream   Cheese or DANNON'S LlGHT N FIT GREEK YOGURT  8 ounces of [place] unsweetened almond/cococunut milk    Treat yourself to a parfait made with whipped cream blueberiies, walnuts and vanilla greek yogurt  Avoid  bananas, pineapple, grapes  and watermelon on a regular basis because they are high in sugar.  THINK OF THEM AS DESSERT  Remember that snack Substitutions should be less than 10 NET carbs per serving and meals < 20 carbs. Remember to subtract fiber grams to get the "net carbs."  @TULLOBREADPACKAGE @

## 2023-08-18 ENCOUNTER — Ambulatory Visit: Payer: Self-pay

## 2023-08-18 ENCOUNTER — Telehealth: Payer: Self-pay | Admitting: Family

## 2023-08-18 DIAGNOSIS — R899 Unspecified abnormal finding in specimens from other organs, systems and tissues: Secondary | ICD-10-CM

## 2023-08-18 NOTE — Telephone Encounter (Signed)
Can we add on urine culture to urine from yesterday?

## 2023-08-19 LAB — URINE CULTURE
MICRO NUMBER:: 15837485
SPECIMEN QUALITY:: ADEQUATE

## 2023-08-25 ENCOUNTER — Telehealth: Payer: Self-pay | Admitting: Family

## 2023-08-25 DIAGNOSIS — E119 Type 2 diabetes mellitus without complications: Secondary | ICD-10-CM

## 2023-08-25 MED ORDER — METFORMIN HCL ER 500 MG PO TB24
500.0000 mg | ORAL_TABLET | Freq: Every evening | ORAL | 0 refills | Status: DC
Start: 2023-08-25 — End: 2023-09-23

## 2023-08-25 NOTE — Assessment & Plan Note (Signed)
Discussed alcohol use.  Consider AAA, outpatient rehab at follow up.

## 2023-08-25 NOTE — Telephone Encounter (Signed)
Call pt  Reviewing chart and realized that I had not started metformin during visit.  Is she okay with starting to lower blood sugar?  I recommend metformin 500 mg twice daily.   Please ensure she has glucometer, strips and lancets.  If she does not please prescribe.  I provided on her after visit summary 08/17/23 fasting blood sugar ranges which are appropriate.  Goal is less than 120.

## 2023-08-25 NOTE — Assessment & Plan Note (Signed)
Discussed marital concerns and safety.  She is currently living with her father.  She takes care of disabled son.  Start Cymbalta 30 mg daily.

## 2023-08-25 NOTE — Telephone Encounter (Signed)
LVM to ask pt the following  Reviewing chart and realized that I had not started metformin during visit.  Is she okay with starting to lower blood sugar?  I recommend metformin 500 mg twice daily.    Please ensure she has glucometer, strips and lancets.  If she does not please prescribe.  I provided on her after visit summary 08/17/23 fasting blood sugar ranges which are appropriate.  Goal is less than 120

## 2023-08-25 NOTE — Telephone Encounter (Signed)
The patient returned your call. She stated she does have the glucometer , strips and lancets. You can call the Metformin into  [facility] - [place], [place] - [address] Phone: [phone]

## 2023-08-25 NOTE — Assessment & Plan Note (Signed)
Lab Results  Component Value Date   HGBA1C 7.2 (H) 08/11/2023   New onset diabetes.  Discussed low glycemic diet.  Recent prednisone use.  Recommended metformin 500 mg twice daily. Will follow.

## 2023-08-25 NOTE — Telephone Encounter (Signed)
I opted to start with metformin 500mg  every day , not BID  Rx sent

## 2023-08-25 NOTE — Assessment & Plan Note (Signed)
Status post hysterectomy.  Presentation consistent with microcytic anemia, in the setting of iron deficiency.  Start ferrous sulfate.  Discussed NSAIDs, alcohol use and risk for gastritis.  Advised to continue omeprazole and to stop Goody's powder.  If hemoglobin does not improve at follow-up, will advise gastroenterology consult.

## 2023-08-25 NOTE — Assessment & Plan Note (Signed)
Largely unchanged.  Awaiting appointment with ENT

## 2023-08-31 ENCOUNTER — Telehealth: Payer: Self-pay

## 2023-08-31 DIAGNOSIS — Z113 Encounter for screening for infections with a predominantly sexual mode of transmission: Secondary | ICD-10-CM

## 2023-08-31 NOTE — Telephone Encounter (Signed)
-----   Message from Rennie Plowman sent at 08/25/2023  5:58 AM EST ----- Call pt serum labs from 08/17/23 , not collected. Please schedule RPR and HSV and change to future

## 2023-08-31 NOTE — Telephone Encounter (Signed)
LVM to inform t of message below, labs are ordered already

## 2023-09-06 ENCOUNTER — Telehealth: Payer: Self-pay

## 2023-09-06 NOTE — Telephone Encounter (Signed)
Spoke to pt she is  aware labs have been ordered and she has been scheduled for labs

## 2023-09-06 NOTE — Telephone Encounter (Signed)
Spoke to pt and informed her that she needs to have the RPR and HSV done pt stated that she will get them done when she comes in for appt on 09/23/23. Pt also stated that she is not certain but  since she has been taking the Cymbalta for past 2 weeks she is not sleeping and is more tired than she usually is. Would like to know what she can do or should she do.

## 2023-09-06 NOTE — Telephone Encounter (Signed)
-----   Message from Rennie Plowman sent at 08/25/2023  5:58 AM EST ----- Call pt serum labs from 08/17/23 , not collected. Please schedule RPR and HSV and change to future

## 2023-09-06 NOTE — Telephone Encounter (Signed)
Copied from CRM [id number]. Topic: Clinical - Medical Advice >> Sep 06, 2023  2:48 PM Alona Bene A wrote: Reason for CRM: Patient returned call. Please call patient back at [phone].

## 2023-09-07 NOTE — Telephone Encounter (Signed)
noted 

## 2023-09-07 NOTE — Telephone Encounter (Signed)
 Call patient She is anemic likely contributing to fatigue.  If she has new or concerning symptoms including significant fatigue ,fever, chest pain or shortness of breath, please advise reevaluation in the emergency room.    For trouble sleeping, if she feels Cymbalta  is directly affecting her sleep, she may take Cymbalta  in the morning.  If she has not noticed an improvement in anxiety and depression, she may take Cymbalta  30 mg every other day for 4 days and then stop medication all together   For sleeping, she may trial  Take 0.5 to 5mg  melatonin at 7pm with dinner -this is when natural melatonin will start to increase You may also trial melatonin combination over the counter supplement such as Sleep#3 or Qunol Sleep 5 in 1

## 2023-09-07 NOTE — Telephone Encounter (Signed)
 Spoke to pt and discussed note below: pt is not having any SOB CP FEVER pt does take melatonin and will let us  know if she wants to stop the Cymbalta  altogether or not She is anemic likely contributing to fatigue.  If she has new or concerning symptoms including significant fatigue ,fever, chest pain or shortness of breath, please advise reevaluation in the emergency room.     For trouble sleeping, if she feels Cymbalta  is directly affecting her sleep, she may take Cymbalta  in the morning.  If she has not noticed an improvement in anxiety and depression, she may take Cymbalta  30 mg every other day for 4 days and then stop medication all together

## 2023-09-23 ENCOUNTER — Ambulatory Visit (INDEPENDENT_AMBULATORY_CARE_PROVIDER_SITE_OTHER): Payer: 59 | Admitting: Family

## 2023-09-23 ENCOUNTER — Encounter: Payer: Self-pay | Admitting: Family

## 2023-09-23 VITALS — BP 130/80 | HR 100 | Temp 98.2°F | Ht 63.0 in | Wt 166.2 lb

## 2023-09-23 DIAGNOSIS — F419 Anxiety disorder, unspecified: Secondary | ICD-10-CM

## 2023-09-23 DIAGNOSIS — F1010 Alcohol abuse, uncomplicated: Secondary | ICD-10-CM

## 2023-09-23 DIAGNOSIS — Z23 Encounter for immunization: Secondary | ICD-10-CM | POA: Diagnosis not present

## 2023-09-23 DIAGNOSIS — F32A Depression, unspecified: Secondary | ICD-10-CM | POA: Diagnosis not present

## 2023-09-23 DIAGNOSIS — D500 Iron deficiency anemia secondary to blood loss (chronic): Secondary | ICD-10-CM | POA: Diagnosis not present

## 2023-09-23 DIAGNOSIS — Z8781 Personal history of (healed) traumatic fracture: Secondary | ICD-10-CM

## 2023-09-23 DIAGNOSIS — E119 Type 2 diabetes mellitus without complications: Secondary | ICD-10-CM | POA: Diagnosis not present

## 2023-09-23 DIAGNOSIS — R21 Rash and other nonspecific skin eruption: Secondary | ICD-10-CM

## 2023-09-23 MED ORDER — DULOXETINE HCL 30 MG PO CPEP
60.0000 mg | ORAL_CAPSULE | Freq: Every day | ORAL | 1 refills | Status: DC
Start: 2023-09-23 — End: 2024-02-07

## 2023-09-23 MED ORDER — TRIAMCINOLONE ACETONIDE 0.5 % EX OINT: 1.0000 | TOPICAL_OINTMENT | Freq: Two times a day (BID) | CUTANEOUS | 2 refills | Status: DC

## 2023-09-23 NOTE — Patient Instructions (Addendum)
Referral for colonoscopy  Let us know if you dont hear back within a week in regards to an appointment being scheduled.   So that you are aware, if you are [facility] MyChart user , please pay attention to your MyChart messages as you may receive a MyChart message with a phone number to call and schedule this test/appointment own your own from our referral coordinator. This is a new process so I do not want you to miss this message.  If you are not a MyChart user, you will receive a phone call.   Please call  and schedule your 3D mammogram and /or bone density scan as we discussed.   [facility]  ( new location in 2023)  [address], [place], [place] [address]  [place], [place]  [phone]

## 2023-09-23 NOTE — Progress Notes (Signed)
Assessment & Plan:  Iron deficiency anemia due to chronic blood loss -     Iron, TIBC and Ferritin Panel; Future -     Ambulatory referral to Gastroenterology -     CBC with Differential/Platelet; Future  Anxiety and depression Assessment & Plan: Chronic, improved from prior.  Breakthrough symptoms with anxiety.  Jointly agreed increasing Cymbalta to 60 mg daily is appropriate.  Close follow-up   Orders: -     DULoxetine HCl; Take 2 capsules (60 mg total) by mouth daily.  Dispense: 90 capsule; Refill: 1  Type 2 diabetes mellitus without complication, without long-term current use of insulin (HCC) Assessment & Plan: Chronic, stable.  Please and fasting blood sugars.  Agree with lifestyle changes.  Will defer starting metformin at this time.  Close follow up  Orders: -     Microalbumin / creatinine urine ratio; Future -     Microalbumin / creatinine urine ratio; Future  Rash Assessment & Plan: No apparent systemic features.  Afebrile and nontoxic in appearance rash appears largely unchanged when compared to previous photograph 08/17/23.  Differential includes syphilis.  Appearance does not suggest infection.  Provide a limited supply of Kenalog for short course, less than 1 week.  Consult dermatology as patient will likely warrant biopsy, further evaluation  Orders: -     HSV(herpes simplex vrs) 1+2 ab-IgG; Future -     RPR; Future -     Triamcinolone Acetonide; Apply 1 Application topically 2 (two) times daily. Use sparingly for < 1 week  Dispense: 15 g; Refill: 2 -     HIV Antibody (routine testing w rflx)  History of fracture -     DG Bone Density; Future  Need for influenza vaccination -     Flu vaccine trivalent PF, 6mos and older(Flulaval,Afluria,Fluarix,Fluzone)  Alcohol abuse Assessment & Plan: She is sober at this time.  Will continue to provide support.       Return precautions given.   Risks, benefits, and alternatives of the medications and treatment plan  prescribed today were discussed, and patient expressed understanding.   Education regarding symptom management and diagnosis given to patient on AVS either electronically or printed.  Return in about 6 weeks (around 11/04/2023).  Rennie Plowman, FNP  Subjective:    Patient ID: Judith Drake, female    DOB: 04-24-1971, 53 y.o.   MRN: [id number]  CC: Judith Drake is a 53 y.o. female who presents today for follow up.   HPI: Follow-up IDA.  Compliant with ferrous sulfate once daily.   Sober and has stopped drinking.   She is feeling well without new complaints.       Compliant with omeprazole.  No longer on Goody's powder  Compliant with Cymbalta 30 mg daily. She has felt anxious. She is interested in increasing cymbalta.    Hoarseness completely resolved. She suspects reflux playing a role.   No pain or trouble with swallowing, unusual weight loss, fever, chills, choking  She declined ENT appointment.  Rash across back has improved with triamcinolone.   No further vision changes in right eye.   She declines Ophthalmology appointment  She didn't start metformin and has focused on dietary changes . FBG 131 She has stopped eating candy.   Allergies: Amoxicillin, Dilaudid [hydromorphone hcl], and Morphine and codeine Current Outpatient Medications on File Prior to Visit  Medication Sig Dispense Refill   amLODipine (NORVASC) 10 MG tablet Take 1 tablet (10 mg total) by mouth daily. 90  tablet 3   ferrous sulfate 325 (65 FE) MG EC tablet Take 1 tablet (325 mg total) by mouth daily with breakfast. 90 tablet 0   mupirocin ointment (BACTROBAN) 2 % Apply 1 Application topically 2 (two) times daily. 22 g 2   nystatin (MYCOSTATIN) 100000 UNIT/ML suspension Take 5 mLs (500,000 Units total) by mouth 4 (four) times daily. X 7 days. Swish around mouth and retain for as long as possible before swallowing. 473 mL 0   omeprazole (PRILOSEC) 20 MG capsule Take 1 capsule (20 mg total)  by mouth daily. 90 capsule 1   triamcinolone (KENALOG) 0.025 % cream Apply 1 Application topically 2 (two) times daily. 15 g 0   cetirizine (ZYRTEC) 10 MG tablet Take 1 tablet (10 mg total) by mouth daily. (Patient not taking: Reported on 09/23/2023) 30 tablet 0   No current facility-administered medications on file prior to visit.    Review of Systems  Constitutional:  Negative for chills and fever.  Respiratory:  Negative for cough.   Cardiovascular:  Negative for chest pain and palpitations.  Gastrointestinal:  Negative for nausea and vomiting.  Skin:  Positive for rash.  Psychiatric/Behavioral:  The patient is nervous/anxious.       Objective:    BP 130/80   Pulse 100   Temp 98.2 F (36.8 C) (Oral)   Ht 5\' 3"  (1.6 m)   Wt 166 lb 3.2 oz (75.4 kg)   LMP  (LMP Unknown)   SpO2 97%   BMI 29.44 kg/m  BP Readings from Last 3 Encounters:  09/23/23 130/80  08/17/23 134/84  08/10/23 122/84   Wt Readings from Last 3 Encounters:  09/23/23 166 lb 3.2 oz (75.4 kg)  08/17/23 163 lb 3.2 oz (74 kg)  08/10/23 164 lb (74.4 kg)      09/23/2023    4:33 PM 08/17/2023   11:05 AM 01/14/2022    3:19 PM  Depression screen PHQ 2/9  Decreased Interest 0 0 0  Down, Depressed, Hopeless 0 0 0  PHQ - 2 Score 0 0 0  Altered sleeping   0  Tired, decreased energy   0  Change in appetite   0  Feeling bad or failure about yourself    0  Trouble concentrating   0  Moving slowly or fidgety/restless   0  Suicidal thoughts   0  PHQ-9 Score   0  Difficult doing work/chores   Not difficult at all    Physical Exam Vitals reviewed.  Constitutional:      Appearance: She is well-developed.  Eyes:     Conjunctiva/sclera: Conjunctivae normal.  Cardiovascular:     Rate and Rhythm: Normal rate and regular rhythm.     Pulses: Normal pulses.     Heart sounds: Normal heart sounds.  Pulmonary:     Effort: Pulmonary effort is normal.     Breath sounds: Normal breath sounds. No wheezing, rhonchi or  rales.  Skin:    General: Skin is warm and dry.  Neurological:     Mental Status: She is alert.  Psychiatric:        Speech: Speech normal.        Behavior: Behavior normal.        Thought Content: Thought content normal.

## 2023-09-24 ENCOUNTER — Telehealth: Payer: Self-pay | Admitting: Family

## 2023-09-24 ENCOUNTER — Other Ambulatory Visit (INDEPENDENT_AMBULATORY_CARE_PROVIDER_SITE_OTHER): Payer: 59

## 2023-09-24 DIAGNOSIS — R21 Rash and other nonspecific skin eruption: Secondary | ICD-10-CM

## 2023-09-24 DIAGNOSIS — F1010 Alcohol abuse, uncomplicated: Secondary | ICD-10-CM | POA: Diagnosis not present

## 2023-09-24 DIAGNOSIS — Z23 Encounter for immunization: Secondary | ICD-10-CM

## 2023-09-24 DIAGNOSIS — E119 Type 2 diabetes mellitus without complications: Secondary | ICD-10-CM

## 2023-09-24 DIAGNOSIS — D500 Iron deficiency anemia secondary to blood loss (chronic): Secondary | ICD-10-CM

## 2023-09-24 DIAGNOSIS — Z8781 Personal history of (healed) traumatic fracture: Secondary | ICD-10-CM | POA: Diagnosis not present

## 2023-09-24 DIAGNOSIS — F32A Depression, unspecified: Secondary | ICD-10-CM | POA: Diagnosis not present

## 2023-09-24 DIAGNOSIS — F419 Anxiety disorder, unspecified: Secondary | ICD-10-CM | POA: Diagnosis not present

## 2023-09-24 NOTE — Telephone Encounter (Signed)
Lft pt vm to call to ofc regarding the referral. Thank you!

## 2023-09-25 LAB — MICROALBUMIN / CREATININE URINE RATIO
Creatinine, Urine: 246 mg/dL (ref 20–275)
Microalb Creat Ratio: 108 mg/g{creat} — ABNORMAL HIGH (ref <30)
Microalb, Ur: 26.6 mg/dL

## 2023-09-29 LAB — CBC WITH DIFFERENTIAL/PLATELET
Absolute Lymphocytes: 1228 {cells}/uL (ref 850–3900)
Absolute Monocytes: 733 {cells}/uL (ref 200–950)
Basophils Absolute: 79 {cells}/uL (ref 0–200)
Basophils Relative: 0.8 %
Eosinophils Absolute: 277 {cells}/uL (ref 15–500)
Eosinophils Relative: 2.8 %
HCT: 45.3 % — ABNORMAL HIGH (ref 35.0–45.0)
Hemoglobin: 14.8 g/dL (ref 11.7–15.5)
MCH: 28.6 pg (ref 27.0–33.0)
MCHC: 32.7 g/dL (ref 32.0–36.0)
MCV: 87.6 fL (ref 80.0–100.0)
MPV: 10 fL (ref 7.5–12.5)
Monocytes Relative: 7.4 %
Neutro Abs: 7583 {cells}/uL (ref 1500–7800)
Neutrophils Relative %: 76.6 %
Platelets: 333 10*3/uL (ref 140–400)
RBC: 5.17 10*6/uL — ABNORMAL HIGH (ref 3.80–5.10)
Total Lymphocyte: 12.4 %
WBC: 9.9 10*3/uL (ref 3.8–10.8)

## 2023-09-29 LAB — RPR: RPR Ser Ql: REACTIVE — AB

## 2023-09-29 LAB — RPR TITER: RPR Titer: 1:128 {titer} — ABNORMAL HIGH

## 2023-09-29 LAB — IRON,TIBC AND FERRITIN PANEL
%SAT: 27 % (ref 16–45)
Ferritin: 114 ng/mL (ref 16–232)
Iron: 136 ug/dL (ref 45–160)
TIBC: 504 ug/dL — ABNORMAL HIGH (ref 250–450)

## 2023-09-29 LAB — HSV(HERPES SIMPLEX VRS) I + II AB-IGG
HSV 1 IGG,TYPE SPECIFIC AB: 46.8 {index} — ABNORMAL HIGH
HSV 2 IGG,TYPE SPECIFIC AB: <0.9 {index}

## 2023-09-29 LAB — T PALLIDUM AB: T Pallidum Abs: POSITIVE — AB

## 2023-09-30 ENCOUNTER — Telehealth: Payer: Self-pay

## 2023-09-30 ENCOUNTER — Encounter: Payer: Self-pay | Admitting: Family

## 2023-09-30 ENCOUNTER — Telehealth: Payer: Self-pay | Admitting: Family

## 2023-09-30 DIAGNOSIS — A529 Late syphilis, unspecified: Secondary | ICD-10-CM

## 2023-09-30 DIAGNOSIS — A539 Syphilis, unspecified: Secondary | ICD-10-CM

## 2023-09-30 DIAGNOSIS — R21 Rash and other nonspecific skin eruption: Secondary | ICD-10-CM

## 2023-09-30 DIAGNOSIS — Z113 Encounter for screening for infections with a predominantly sexual mode of transmission: Secondary | ICD-10-CM

## 2023-09-30 MED ORDER — DOXYCYCLINE HYCLATE 100 MG PO TABS: 100.0000 mg | ORAL_TABLET | Freq: Two times a day (BID) | ORAL | 0 refills | Status: DC

## 2023-09-30 NOTE — Telephone Encounter (Signed)
Spoke to pt and scheduled lab appt for 10/12/23. Pt also wanted me to ask if you see pts with special needs? If not is their anyone that you can recommend she is looking for someone for her son to start seeing because he is aging out of Peds.

## 2023-09-30 NOTE — Assessment & Plan Note (Signed)
Chronic, improved from prior.  Breakthrough symptoms with anxiety.  Jointly agreed increasing Cymbalta to 60 mg daily is appropriate.  Close follow-up

## 2023-09-30 NOTE — Telephone Encounter (Signed)
Copied from CRM [id number]. Topic: Clinical - Lab/Test Results >> Sep 30, 2023 12:57 PM Tiffany H wrote: Reason for CRM: Denyse Amass with the Health Department called to advise that she had a recent positive syphilis test (reactive) result. Health department is concerned about public safety and would like information about the test result and/or treatment plan. Please assist.   Phone: [phone]

## 2023-09-30 NOTE — Assessment & Plan Note (Signed)
Chronic, stable.  Please and fasting blood sugars.  Agree with lifestyle changes.  Will defer starting metformin at this time.  Close follow up

## 2023-09-30 NOTE — Telephone Encounter (Signed)
Call and sch pt 1-2 week follow up Sch non fasting labs prior to to screen for hepatitis, HIV.  I have ordered.    Please call health department and report case of syphilis.

## 2023-09-30 NOTE — Telephone Encounter (Signed)
Spoke to Catlett from [employer] and he wanted to inquire about the type of Antibiotic that was prescribed , how many for how long, and what was the reasoning for testing initially. information was provided    Judith Drake with the Health Department called to advise that she had a recent positive syphilis test (reactive) result. Health department is concerned about public safety and would like information about the test result and/or treatment plan. Please assist.    Phone: [phone]

## 2023-09-30 NOTE — Telephone Encounter (Signed)
Called and spoke with patient from him in regards to abnormalities in lab work Discussed positive RPR, RPR titer, T palladium antibodies.  Consulted with infectious disease, Aldean Baker, NP in regards to rash, syphilis.  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  Intolerance to penicillin  opted to treat with doxycycline 100 mg twice daily for 4 weeks to cover for bacterial rash and syphilis.  Advised to abstain from intercourse, oral sex for 1 week.  Advised patient health apartment will call her.  Advised probiotics and to take doxycycline with food.  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 Urine micro elevated.  Plan to discuss at follow-up starting ARB Anemia has resolved  Consider dermatology and infectious disease consult

## 2023-09-30 NOTE — Assessment & Plan Note (Signed)
She is sober at this time.  Will continue to provide support.

## 2023-09-30 NOTE — Assessment & Plan Note (Signed)
No apparent systemic features.  Afebrile and nontoxic in appearance rash appears largely unchanged when compared to previous photograph 08/17/23.  Differential includes syphilis.  Appearance does not suggest infection.  Provide a limited supply of Kenalog for short course, less than 1 week.  Consult dermatology as patient will likely warrant biopsy, further evaluation

## 2023-10-04 NOTE — Telephone Encounter (Signed)
LVM to inform pt that per Claris Che  Happy to care for her son Sch TOC appt; ensure 30 min block and paperwork completed ahead of time

## 2023-10-05 NOTE — Telephone Encounter (Signed)
Spoke to pt and she will call back to schedule appt for her son

## 2023-10-12 ENCOUNTER — Other Ambulatory Visit (INDEPENDENT_AMBULATORY_CARE_PROVIDER_SITE_OTHER): Payer: 59

## 2023-10-12 DIAGNOSIS — A539 Syphilis, unspecified: Secondary | ICD-10-CM | POA: Diagnosis not present

## 2023-10-12 DIAGNOSIS — Z113 Encounter for screening for infections with a predominantly sexual mode of transmission: Secondary | ICD-10-CM | POA: Diagnosis not present

## 2023-10-15 LAB — ACUTE HEP PANEL AND HEP B SURFACE AB
HEPATITIS C ANTIBODY REFILL$(REFL): NONREACTIVE
Hep A IgM: NONREACTIVE
Hep B C IgM: NONREACTIVE
Hepatitis B Surface Ag: NONREACTIVE

## 2023-10-15 LAB — HEPATITIS B SURFACE ANTIBODY, QUANTITATIVE: Hep B S AB Quant (Post): 13 m[IU]/mL (ref >=10)

## 2023-10-15 LAB — HIV ANTIBODY (ROUTINE TESTING W REFLEX): HIV 1&2 Ab, 4th Generation: NONREACTIVE

## 2023-10-15 LAB — REFLEX TIQ

## 2023-10-28 ENCOUNTER — Encounter (HOSPITAL_COMMUNITY): Payer: Self-pay | Admitting: Emergency Medicine

## 2023-10-28 ENCOUNTER — Ambulatory Visit
Admission: RE | Admit: 2023-10-28 | Discharge: 2023-10-28 | Disposition: A | Discharge status: Home / Self Care | Payer: 59 | Source: Ambulatory Visit | Attending: Internal Medicine | Admitting: Internal Medicine

## 2023-10-28 ENCOUNTER — Emergency Department (HOSPITAL_COMMUNITY): Payer: 59

## 2023-10-28 ENCOUNTER — Inpatient Hospital Stay (HOSPITAL_COMMUNITY)
Admission: EM | Admit: 2023-10-28 | Discharge: 2023-10-30 | DRG: 872 | Disposition: A | Discharge status: Home / Self Care | Payer: 59 | Attending: Family Medicine | Admitting: Family Medicine

## 2023-10-28 VITALS — BP 159/106 | HR 159 | Temp 101.4°F | Resp 22

## 2023-10-28 DIAGNOSIS — R000 Tachycardia, unspecified: Secondary | ICD-10-CM

## 2023-10-28 DIAGNOSIS — E876 Hypokalemia: Principal | ICD-10-CM | POA: Diagnosis present

## 2023-10-28 DIAGNOSIS — E119 Type 2 diabetes mellitus without complications: Secondary | ICD-10-CM | POA: Diagnosis present

## 2023-10-28 DIAGNOSIS — J111 Influenza due to unidentified influenza virus with other respiratory manifestations: Secondary | ICD-10-CM | POA: Diagnosis present

## 2023-10-28 DIAGNOSIS — R112 Nausea with vomiting, unspecified: Secondary | ICD-10-CM | POA: Diagnosis not present

## 2023-10-28 DIAGNOSIS — I10 Essential (primary) hypertension: Secondary | ICD-10-CM | POA: Diagnosis not present

## 2023-10-28 DIAGNOSIS — J020 Streptococcal pharyngitis: Secondary | ICD-10-CM | POA: Diagnosis present

## 2023-10-28 DIAGNOSIS — A409 Streptococcal sepsis, unspecified: Principal | ICD-10-CM | POA: Diagnosis present

## 2023-10-28 DIAGNOSIS — B349 Viral infection, unspecified: Secondary | ICD-10-CM | POA: Diagnosis not present

## 2023-10-28 DIAGNOSIS — Z8249 Family history of ischemic heart disease and other diseases of the circulatory system: Secondary | ICD-10-CM

## 2023-10-28 DIAGNOSIS — F17210 Nicotine dependence, cigarettes, uncomplicated: Secondary | ICD-10-CM | POA: Diagnosis present

## 2023-10-28 DIAGNOSIS — K219 Gastro-esophageal reflux disease without esophagitis: Secondary | ICD-10-CM | POA: Diagnosis present

## 2023-10-28 DIAGNOSIS — Z880 Allergy status to penicillin: Secondary | ICD-10-CM | POA: Diagnosis not present

## 2023-10-28 DIAGNOSIS — I4581 Long QT syndrome: Secondary | ICD-10-CM | POA: Diagnosis not present

## 2023-10-28 DIAGNOSIS — K760 Fatty (change of) liver, not elsewhere classified: Secondary | ICD-10-CM | POA: Diagnosis not present

## 2023-10-28 DIAGNOSIS — M542 Cervicalgia: Secondary | ICD-10-CM | POA: Diagnosis not present

## 2023-10-28 DIAGNOSIS — F419 Anxiety disorder, unspecified: Secondary | ICD-10-CM | POA: Diagnosis present

## 2023-10-28 DIAGNOSIS — R519 Headache, unspecified: Secondary | ICD-10-CM | POA: Diagnosis not present

## 2023-10-28 DIAGNOSIS — R6510 Systemic inflammatory response syndrome (SIRS) of non-infectious origin without acute organ dysfunction: Secondary | ICD-10-CM | POA: Diagnosis present

## 2023-10-28 DIAGNOSIS — Z79899 Other long term (current) drug therapy: Secondary | ICD-10-CM

## 2023-10-28 DIAGNOSIS — Z885 Allergy status to narcotic agent status: Secondary | ICD-10-CM

## 2023-10-28 DIAGNOSIS — E860 Dehydration: Secondary | ICD-10-CM | POA: Diagnosis not present

## 2023-10-28 DIAGNOSIS — Z90710 Acquired absence of both cervix and uterus: Secondary | ICD-10-CM

## 2023-10-28 DIAGNOSIS — A530 Latent syphilis, unspecified as early or late: Secondary | ICD-10-CM | POA: Diagnosis not present

## 2023-10-28 DIAGNOSIS — Z8619 Personal history of other infectious and parasitic diseases: Secondary | ICD-10-CM

## 2023-10-28 DIAGNOSIS — Z1152 Encounter for screening for COVID-19: Secondary | ICD-10-CM

## 2023-10-28 DIAGNOSIS — B950 Streptococcus, group A, as the cause of diseases classified elsewhere: Secondary | ICD-10-CM | POA: Diagnosis not present

## 2023-10-28 DIAGNOSIS — S2243XA Multiple fractures of ribs, bilateral, initial encounter for closed fracture: Secondary | ICD-10-CM | POA: Diagnosis not present

## 2023-10-28 DIAGNOSIS — D72829 Elevated white blood cell count, unspecified: Secondary | ICD-10-CM | POA: Diagnosis not present

## 2023-10-28 DIAGNOSIS — F32A Depression, unspecified: Secondary | ICD-10-CM | POA: Diagnosis present

## 2023-10-28 DIAGNOSIS — Z833 Family history of diabetes mellitus: Secondary | ICD-10-CM | POA: Diagnosis not present

## 2023-10-28 DIAGNOSIS — R070 Pain in throat: Secondary | ICD-10-CM | POA: Diagnosis not present

## 2023-10-28 DIAGNOSIS — R0602 Shortness of breath: Secondary | ICD-10-CM

## 2023-10-28 DIAGNOSIS — R918 Other nonspecific abnormal finding of lung field: Secondary | ICD-10-CM | POA: Diagnosis not present

## 2023-10-28 DIAGNOSIS — R9431 Abnormal electrocardiogram [ECG] [EKG]: Secondary | ICD-10-CM | POA: Diagnosis not present

## 2023-10-28 DIAGNOSIS — R197 Diarrhea, unspecified: Secondary | ICD-10-CM | POA: Diagnosis present

## 2023-10-28 DIAGNOSIS — G319 Degenerative disease of nervous system, unspecified: Secondary | ICD-10-CM | POA: Diagnosis not present

## 2023-10-28 DIAGNOSIS — R590 Localized enlarged lymph nodes: Secondary | ICD-10-CM | POA: Diagnosis not present

## 2023-10-28 DIAGNOSIS — R509 Fever, unspecified: Secondary | ICD-10-CM | POA: Diagnosis not present

## 2023-10-28 LAB — CBC WITH DIFFERENTIAL/PLATELET
Abs Immature Granulocytes: 0.15 10*3/uL — ABNORMAL HIGH (ref 0.00–0.07)
Basophils Absolute: 0.1 10*3/uL (ref 0.0–0.1)
Basophils Relative: 0 %
Eosinophils Absolute: 0 10*3/uL (ref 0.0–0.5)
Eosinophils Relative: 0 %
HCT: 46.1 % — ABNORMAL HIGH (ref 36.0–46.0)
Hemoglobin: 15.2 g/dL — ABNORMAL HIGH (ref 12.0–15.0)
Immature Granulocytes: 1 %
Lymphocytes Relative: 6 %
Lymphs Abs: 1.1 10*3/uL (ref 0.7–4.0)
MCH: 30.8 pg (ref 26.0–34.0)
MCHC: 33 g/dL (ref 30.0–36.0)
MCV: 93.3 fL (ref 80.0–100.0)
Monocytes Absolute: 1.2 10*3/uL — ABNORMAL HIGH (ref 0.1–1.0)
Monocytes Relative: 6 %
Neutro Abs: 16.9 10*3/uL — ABNORMAL HIGH (ref 1.7–7.7)
Neutrophils Relative %: 87 %
Platelets: 239 10*3/uL (ref 150–400)
RBC: 4.94 MIL/uL (ref 3.87–5.11)
RDW: 17.2 % — ABNORMAL HIGH (ref 11.5–15.5)
WBC: 19.4 10*3/uL — ABNORMAL HIGH (ref 4.0–10.5)
nRBC: 0 % (ref 0.0–0.2)

## 2023-10-28 LAB — BASIC METABOLIC PANEL
Anion gap: 15 (ref 5–15)
BUN: 8 mg/dL (ref 6–20)
CO2: 25 mmol/L (ref 22–32)
Calcium: 9.1 mg/dL (ref 8.9–10.3)
Chloride: 94 mmol/L — ABNORMAL LOW (ref 98–111)
Creatinine, Ser: 0.51 mg/dL (ref 0.44–1.00)
GFR, Estimated: >60 mL/min (ref >60)
Glucose, Bld: 121 mg/dL — ABNORMAL HIGH (ref 70–99)
Potassium: 2.6 mmol/L — CL (ref 3.5–5.1)
Sodium: 134 mmol/L — ABNORMAL LOW (ref 135–145)

## 2023-10-28 LAB — RESP PANEL BY RT-PCR (RSV, FLU A&B, COVID)  RVPGX2
Influenza A by PCR: NEGATIVE
Influenza B by PCR: NEGATIVE
Resp Syncytial Virus by PCR: NEGATIVE
SARS Coronavirus 2 by RT PCR: NEGATIVE

## 2023-10-28 LAB — LACTIC ACID, PLASMA: Lactic Acid, Venous: 1.1 mmol/L (ref 0.5–1.9)

## 2023-10-28 LAB — POCT FASTING CBG KUC MANUAL ENTRY: POCT Glucose (KUC): 182 mg/dL — AB (ref 70–99)

## 2023-10-28 MED ORDER — ACETAMINOPHEN 325 MG PO TABS
650.0000 mg | ORAL_TABLET | Freq: Four times a day (QID) | ORAL | Status: DC | PRN
  Administered 2023-10-29: 650 mg via ORAL
  Filled 2023-10-28: qty 2

## 2023-10-28 MED ORDER — MELATONIN 5 MG PO TABS
5.0000 mg | ORAL_TABLET | Freq: Every evening | ORAL | Status: DC | PRN
  Administered 2023-10-29: 5 mg via ORAL
  Filled 2023-10-28: qty 1

## 2023-10-28 MED ORDER — PROCHLORPERAZINE EDISYLATE 10 MG/2ML IJ SOLN
5.0000 mg | Freq: Four times a day (QID) | INTRAMUSCULAR | Status: DC | PRN
  Administered 2023-10-30: 5 mg via INTRAVENOUS
  Filled 2023-10-28: qty 2

## 2023-10-28 MED ORDER — POTASSIUM CHLORIDE 10 MEQ/100ML IV SOLN
10.0000 meq | INTRAVENOUS | Status: AC
  Administered 2023-10-28 – 2023-10-29 (×5): 10 meq via INTRAVENOUS
  Filled 2023-10-28 (×5): qty 100

## 2023-10-28 MED ORDER — ACETAMINOPHEN 325 MG PO TABS
975.0000 mg | ORAL_TABLET | Freq: Once | ORAL | Status: AC
  Administered 2023-10-28: 975 mg via ORAL

## 2023-10-28 MED ORDER — ONDANSETRON HCL 4 MG/2ML IJ SOLN
4.0000 mg | Freq: Once | INTRAMUSCULAR | Status: DC
  Filled 2023-10-28: qty 2

## 2023-10-28 MED ORDER — IBUPROFEN 800 MG PO TABS
800.0000 mg | ORAL_TABLET | Freq: Once | ORAL | Status: AC
  Administered 2023-10-28: 800 mg via ORAL
  Filled 2023-10-28: qty 1

## 2023-10-28 MED ORDER — SODIUM CHLORIDE 0.9 % IV BOLUS
1000.0000 mL | Freq: Once | INTRAVENOUS | Status: AC
  Administered 2023-10-28: 1000 mL via INTRAVENOUS

## 2023-10-28 MED ORDER — IPRATROPIUM-ALBUTEROL 0.5-2.5 (3) MG/3ML IN SOLN
3.0000 mL | Freq: Once | RESPIRATORY_TRACT | Status: AC
  Administered 2023-10-28: 3 mL via RESPIRATORY_TRACT
  Filled 2023-10-28: qty 3

## 2023-10-28 MED ORDER — POTASSIUM CHLORIDE CRYS ER 20 MEQ PO TBCR
40.0000 meq | EXTENDED_RELEASE_TABLET | Freq: Once | ORAL | Status: AC
  Administered 2023-10-28: 40 meq via ORAL
  Filled 2023-10-28: qty 2

## 2023-10-28 MED ORDER — POLYETHYLENE GLYCOL 3350 17 G PO PACK: 17.0000 g | PACK | Freq: Every day | ORAL | Status: DC | PRN

## 2023-10-28 MED ORDER — SODIUM CHLORIDE 0.9 % IV SOLN
2.0000 g | INTRAVENOUS | Status: DC
  Administered 2023-10-29: 2 g via INTRAVENOUS
  Filled 2023-10-28: qty 20

## 2023-10-28 NOTE — ED Triage Notes (Signed)
Pt here sent from UC with flu like symptoms fever and tachy , started feeling bad yesterday , was given 400 of fluids and tylenol at [facility]

## 2023-10-28 NOTE — ED Notes (Signed)
Patient is being discharged from the Urgent Care and sent to the Emergency Department via EMS . Per Juliet Rude, FNP, patient is in need of higher level of care due to Tachycardia, fever. Patient is aware and verbalizes understanding of plan of care.  Vitals:   10/28/23 1634  BP: (!) 159/106  Pulse: (!) 159  Resp: (!) 22  Temp: (!) 101.4 F (38.6 C)  SpO2: 95%

## 2023-10-28 NOTE — ED Provider Notes (Signed)
 [facility] EMERGENCY DEPARTMENT AT [facility] Provider Note   CSN: [id number] Arrival date & time: 10/28/23  1740     History  Chief Complaint  Patient presents with   Influenza    Judith Drake is a 53 y.o. female.  The history is provided by the patient and medical records. No language interpreter was used.  Influenza    53 year old female history of hypertension, anemia, alcohol use, diabetes sent here from urgent care center with concerns of flulike symptoms.  For the past 2 days patient has had fever, chills, body aches, headache, nausea, vomiting, diarrhea, congestion, cough, and generalized fatigue.  She endorsed decrease in appetite.  She does not complain of any neck stiffness no rash no urinary symptoms.  She was seen at urgent care center earlier today for her symptoms.  She was found to be very tachycardic and was sent to the ER for further evaluation.  Patient denies any recent sick contact.  Home Medications Prior to Admission medications   Medication Sig Start Date End Date Taking? Authorizing Provider  amLODipine (NORVASC) 10 MG tablet Take 1 tablet (10 mg total) by mouth daily. 08/10/23   Bethanie Dicker, NP  cetirizine (ZYRTEC) 10 MG tablet Take 1 tablet (10 mg total) by mouth daily. Patient not taking: Reported on 09/23/2023 07/27/23   Eustace Moore, MD  doxycycline (VIBRA-TABS) 100 MG tablet Take 1 tablet (100 mg total) by mouth 2 (two) times daily for 28 days. 09/30/23 10/28/23  Allegra Grana, FNP  DULoxetine (CYMBALTA) 30 MG capsule Take 2 capsules (60 mg total) by mouth daily. 09/23/23   Allegra Grana, FNP  ferrous sulfate 325 (65 FE) MG EC tablet Take 1 tablet (325 mg total) by mouth daily with breakfast. 08/17/23   Allegra Grana, FNP  mupirocin ointment (BACTROBAN) 2 % Apply 1 Application topically 2 (two) times daily. 08/17/23   Allegra Grana, FNP  nystatin (MYCOSTATIN) 100000 UNIT/ML suspension Take 5 mLs (500,000 Units  total) by mouth 4 (four) times daily. X 7 days. Swish around mouth and retain for as long as possible before swallowing. 08/10/23   Bethanie Dicker, NP  omeprazole (PRILOSEC) 20 MG capsule Take 1 capsule (20 mg total) by mouth daily. 08/10/23   Bethanie Dicker, NP  triamcinolone (KENALOG) 0.025 % cream Apply 1 Application topically 2 (two) times daily. 08/17/23   Allegra Grana, FNP  triamcinolone ointment (KENALOG) 0.5 % Apply 1 Application topically 2 (two) times daily. Use sparingly for < 1 week 09/23/23   Allegra Grana, FNP      Allergies    Amoxicillin, Dilaudid [hydromorphone hcl], and Morphine and codeine    Review of Systems   Review of Systems  All other systems reviewed and are negative.   Physical Exam Updated Vital Signs BP (!) 149/94   Pulse (!) 135   Temp (!) 102.8 F (39.3 C) (Oral)   Resp 20   LMP  (LMP Unknown)   SpO2 95%  Physical Exam Vitals and nursing note reviewed.  Constitutional:      General: She is not in acute distress.    Appearance: She is well-developed.  HENT:     Head: Atraumatic.  Eyes:     Conjunctiva/sclera: Conjunctivae normal.  Cardiovascular:     Rate and Rhythm: Tachycardia present.  Pulmonary:     Effort: Pulmonary effort is normal.     Breath sounds: No wheezing, rhonchi or rales.  Abdominal:  Palpations: Abdomen is soft.     Tenderness: There is no abdominal tenderness.  Musculoskeletal:        General: Normal range of motion.     Cervical back: Normal range of motion and neck supple. No rigidity or tenderness.  Skin:    Findings: No rash.  Neurological:     Mental Status: She is alert and oriented to person, place, and time.  Psychiatric:        Mood and Affect: Mood normal.     ED Results / Procedures / Treatments   Labs (all labs ordered are listed, but only abnormal results are displayed) Labs Reviewed  BASIC METABOLIC PANEL - Abnormal; Notable for the following components:      Result Value   Sodium 134 (*)     Potassium 2.6 (*)    Chloride 94 (*)    Glucose, Bld 121 (*)    All other components within normal limits  CBC WITH DIFFERENTIAL/PLATELET - Abnormal; Notable for the following components:   WBC 19.4 (*)    Hemoglobin 15.2 (*)    HCT 46.1 (*)    RDW 17.2 (*)    Neutro Abs 16.9 (*)    Monocytes Absolute 1.2 (*)    Abs Immature Granulocytes 0.15 (*)    All other components within normal limits  RESP PANEL BY RT-PCR (RSV, FLU A&B, COVID)  RVPGX2  CULTURE, BLOOD (ROUTINE X 2)  CULTURE, BLOOD (ROUTINE X 2)  LACTIC ACID, PLASMA  URINALYSIS, ROUTINE W REFLEX MICROSCOPIC  RPR  HIV ANTIBODY (ROUTINE TESTING W REFLEX)  T.PALLIDUM AB, TOTAL  LACTIC ACID, PLASMA    EKG None  Radiology DG Chest 2 View Result Date: 10/28/2023 CLINICAL DATA:  Fever.  Flu-like symptoms. EXAM: CHEST - 2 VIEW COMPARISON:  Radiograph and CT 09/08/2022 FINDINGS: The cardiomediastinal contours are normal. Bronchial thickening. Pulmonary vasculature is normal. No consolidation, pleural effusion, or pneumothorax. Multiple remote bilateral rib fractures. No acute osseous abnormalities are seen. IMPRESSION: Bronchial thickening without focal airspace disease. Electronically Signed   By: Narda Rutherford M.D.   On: 10/28/2023 18:17    Procedures Procedures    Medications Ordered in ED Medications  potassium chloride SA (KLOR-CON M) CR tablet 40 mEq (has no administration in time range)  potassium chloride 10 mEq in 100 mL IVPB (has no administration in time range)  ondansetron (ZOFRAN) injection 4 mg (has no administration in time range)  ipratropium-albuterol (DUONEB) 0.5-2.5 (3) MG/3ML nebulizer solution 3 mL (has no administration in time range)  ibuprofen (ADVIL) tablet 800 mg (800 mg Oral Given 10/28/23 1758)  sodium chloride 0.9 % bolus 1,000 mL (1,000 mLs Intravenous New Bag/Given 10/28/23 1940)    ED Course/ Medical Decision Making/ A&P                                 Medical Decision Making Amount  and/or Complexity of Data Reviewed Labs: ordered. Radiology: ordered. ECG/medicine tests: ordered.  Risk Prescription drug management. Decision regarding hospitalization.   BP (!) 139/98   Pulse (!) 119   Temp (!) 101.1 F (38.4 C) (Oral)   Resp 19   LMP  (LMP Unknown)   SpO2 97%   65:32 PM   53 year old female history of hypertension, anemia, alcohol use, diabetes sent here from urgent care center with concerns of flulike symptoms.  For the past 2 days patient has had fever, chills, body aches, headache, nausea, vomiting,  diarrhea, congestion, cough, and generalized fatigue.  She endorsed decrease in appetite.  She does not complain of any neck stiffness no rash no urinary symptoms.  She was seen at urgent care center earlier today for her symptoms.  She was found to be very tachycardic and was sent to the ER for further evaluation.  Patient denies any recent sick contact.  On exam, patient is warm to the touch, she is tachycardic but not nontoxic in appearance.  She does not have any nuchal rigidity concerning for meningitis.  Abdomen is soft nontender low suspicion for abdominal pathology.  No concerning rash, strength equal throughout, she is mentating appropriately.  -Labs ordered, independently viewed and interpreted by me.  Labs remarkable for K+ 2.6, will give supplementation.  WBC 19.4.  covid/flu/rsv negative -The patient was maintained on a cardiac monitor.  I personally viewed and interpreted the cardiac monitored which showed an underlying rhythm of: sinus tachycardia -Imaging independently viewed and interpreted by me and I agree with radiologist's interpretation.  Result remarkable for CXR showing bronchial thickening without focal airspace disease -This patient presents to the ED for concern of flu-like sxs , this involves an extensive number of treatment options, and is a complaint that carries with it a high risk of complications and morbidity.  The differential diagnosis  includes covid, flu, rsv, pneumonia, viral illness, uti, meningitis -Co morbidities that complicate the patient evaluation includes HTN, anemia, DM -Treatment includes IVF, duonebs, tylenol, ibuprofen, potassium supplementation, zofran -Reevaluation of the patient after these medicines showed that the patient improved -PCP office notes or outside notes reviewed -Discussion with specialist Dr. Margo Aye who agrees to admit pt.  -Escalation to admission/observation considered: patient is agreeable with admission        Final Clinical Impression(s) / ED Diagnoses Final diagnoses:  Hypokalemia  Viral illness  Nausea vomiting and diarrhea  SIRS (systemic inflammatory response syndrome) [facility])    Rx / DC Orders ED Discharge Orders     None         Fayrene Helper, PA-C 10/28/23 2332    Durwin Glaze, MD 11/01/23 [phone]

## 2023-10-28 NOTE — ED Provider Notes (Signed)
[facility]    CSN: [id number] Arrival date & time: 10/28/23  1624      History   Chief Complaint Chief Complaint  Patient presents with   Influenza    Flu symptoms and sore throat with severe headache - Entered by patient    HPI Judith Drake is a 53 y.o. female.   Judith Drake is a 53 y.o. female presenting for chief complaint of cough, congestion, body aches, and fever that started yesterday.  Cough is minimally productive and she reports persistent shortness of breath with exertion and at rest.  Reports nausea without vomiting, diarrhea, or abdominal pain.  Additionally reports dizziness with position changes and generalized headache that she describes as a throbbing pain.  No recent sick contacts with similar symptoms.  Denies chest pain, leg swelling, palpitations, and rash.  She is a type II diabetic and has not eaten today, states she feels very thirsty and dehydrated.  Current everyday cigarette smoker, denies other drug use.  Denies history of chronic respiratory problems. Heart rate currently 159 and fever 101.4.  She has not had any recent antipyretics.     Influenza   Past Medical History:  Diagnosis Date   Abnormal uterine bleeding (AUB) 03/20/2019   Acute lower UTI 05/12/2019   Acute respiratory failure with hypoxia (HCC) 04/10/2018   Anemia    Arthritis    lower back and hips   Dysmenorrhea 03/20/2019   Elevated liver enzymes 11/24/2013   ETOH abuse    Fatty liver 10/03/2019   Fibroid    GERD (gastroesophageal reflux disease)    Heavy menstrual period    Hypertension    Menorrhagia 12/13/2017   SIRS (systemic inflammatory response syndrome) (HCC) 05/12/2019    Patient Active Problem List   Diagnosis Date Noted   DM (diabetes mellitus) (HCC) 08/25/2023   IDA (iron deficiency anemia) 08/17/2023   Vision changes 08/17/2023   Hoarseness of voice 08/10/2023   Muscle cramps at night 08/10/2023   Oral thrush 08/10/2023   Closed right ankle  fracture 03/24/2022   Vitamin D deficiency 01/21/2022   HLD (hyperlipidemia) 01/21/2022   TIA (transient ischemic attack) 01/16/2022   OAB (overactive bladder) 01/14/2022   Hand pain 07/19/2020   Dysplasia of cervix, 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   Status post hysterectomy 12/19/2019   Elevated transaminase level 12/18/2019   Hypomagnesemia    Nonallopathic lesion of sacral region 03/28/2018   Nonallopathic lesion of thoracic region 03/28/2018   Nonallopathic lesion of lumbosacral region 03/28/2018   Nonallopathic lesion of pelvic region 03/28/2018   Nonallopathic lesion of cervical region 03/28/2018   Enlarged thyroid 03/07/2018   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   Lumbar radiculopathy, right 02/08/2018   Anxiety and depression 01/03/2018   Rash 01/03/2018   Right hip pain 01/03/2018   GERD (gastroesophageal reflux disease) 12/13/2017   Hypokalemia 12/13/2017   Depression 11/24/2013   Fatigue 07/12/2013   Essential hypertension 07/12/2013   Alcohol abuse 07/12/2013   Tobacco abuse 07/12/2013   Yeast infection of the skin 07/12/2013   Routine general medical examination at a health care facility 07/12/2013    Past Surgical History:  Procedure Laterality Date   BACK SURGERY     CYSTOSCOPY N/A 12/19/2019   Procedure: CYSTOSCOPY;  Surgeon: [place] Bing, MD;  Location: [facility] OR;  Service: Gynecology;  Laterality: N/A;   ELBOW SURGERY     1997    ORIF ANKLE FRACTURE Right 03/25/2022   Procedure:  OPEN REDUCTION INTERNAL FIXATION (ORIF) ANKLE FRACTURE;  Surgeon: Cammy Copa, MD;  Location: [facility] OR;  Service: Orthopedics;  Laterality: Right;   TOTAL LAPAROSCOPIC HYSTERECTOMY WITH SALPINGECTOMY Bilateral 12/19/2019   Procedure: TOTAL LAPAROSCOPIC HYSTERECTOMY WITH BILATERAL SALPINGECTOMY;  Surgeon: Nuevo Bing, MD;  Location: [facility] OR;  Service: Gynecology;  Laterality: Bilateral;   TUBAL LIGATION      OB History     Gravida  2   Para  2   Term   2   Preterm      AB      Living  2      SAB      IAB      Ectopic      Multiple      Live Births  2        Obstetric Comments  Vaginal x 2          Home Medications    Prior to Admission medications   Medication Sig Start Date End Date Taking? Authorizing Provider  amLODipine (NORVASC) 10 MG tablet Take 1 tablet (10 mg total) by mouth daily. 08/10/23   Bethanie Dicker, NP  cetirizine (ZYRTEC) 10 MG tablet Take 1 tablet (10 mg total) by mouth daily. Patient not taking: Reported on 09/23/2023 07/27/23   Eustace Moore, MD  doxycycline (VIBRA-TABS) 100 MG tablet Take 1 tablet (100 mg total) by mouth 2 (two) times daily for 28 days. 09/30/23 10/28/23  Allegra Grana, FNP  DULoxetine (CYMBALTA) 30 MG capsule Take 2 capsules (60 mg total) by mouth daily. 09/23/23   Allegra Grana, FNP  ferrous sulfate 325 (65 FE) MG EC tablet Take 1 tablet (325 mg total) by mouth daily with breakfast. 08/17/23   Allegra Grana, FNP  mupirocin ointment (BACTROBAN) 2 % Apply 1 Application topically 2 (two) times daily. 08/17/23   Allegra Grana, FNP  nystatin (MYCOSTATIN) 100000 UNIT/ML suspension Take 5 mLs (500,000 Units total) by mouth 4 (four) times daily. X 7 days. Swish around mouth and retain for as long as possible before swallowing. 08/10/23   Bethanie Dicker, NP  omeprazole (PRILOSEC) 20 MG capsule Take 1 capsule (20 mg total) by mouth daily. 08/10/23   Bethanie Dicker, NP  triamcinolone (KENALOG) 0.025 % cream Apply 1 Application topically 2 (two) times daily. 08/17/23   Allegra Grana, FNP  triamcinolone ointment (KENALOG) 0.5 % Apply 1 Application topically 2 (two) times daily. Use sparingly for < 1 week 09/23/23   Allegra Grana, FNP    Family History Family History  Problem Relation Age of Onset   Cancer Mother 53       Lung    Hyperlipidemia Mother    Hypertension Mother    Stroke Mother    Kidney disease Mother    Diabetes Mother    Heart disease Mother     Depression Mother    Hyperlipidemia Father    Cancer Paternal Grandfather 61       Colon Cancer - died   Autism Son    Breast cancer Neg Hx     Social History Social History   Tobacco Use   Smoking status: Some Days    Current packs/day: 0.50    Types: Cigarettes   Smokeless tobacco: Never  Vaping Use   Vaping status: Never Used  Substance Use Topics   Alcohol use: Yes    Comment: She drinks vodka and cranberry, approx 6 -8 mini bottles per day   Drug use:  No     Allergies   Amoxicillin, Dilaudid [hydromorphone hcl], and Morphine and codeine   Review of Systems Review of Systems Per HPI  Physical Exam Triage Vital Signs ED Triage Vitals  Encounter Vitals Group     BP 10/28/23 1634 (!) 159/106     Systolic BP Percentile --      Diastolic BP Percentile --      Pulse Rate 10/28/23 1634 (!) 159     Resp 10/28/23 1634 (!) 22     Temp 10/28/23 1634 (!) 101.4 F (38.6 C)     Temp Source 10/28/23 1634 Oral     SpO2 10/28/23 1634 95 %     Weight --      Height --      Head Circumference --      Peak Flow --      Pain Score 10/28/23 1640 10     Pain Loc --      Pain Education --      Exclude from Growth Chart --    No data found.  Updated Vital Signs BP (!) 159/106 (BP Location: Right Arm)   Pulse (!) 159   Temp (!) 101.4 F (38.6 C) (Oral)   Resp (!) 22   LMP  (LMP Unknown)   SpO2 95%   Visual Acuity Right Eye Distance:   Left Eye Distance:   Bilateral Distance:    Right Eye Near:   Left Eye Near:    Bilateral Near:     Physical Exam Vitals and nursing note reviewed.  Constitutional:      Appearance: She is ill-appearing and diaphoretic. She is not toxic-appearing.  HENT:     Head: Normocephalic and atraumatic.     Right Ear: Hearing, tympanic membrane, ear canal and external ear normal.     Left Ear: Hearing, tympanic membrane, ear canal and external ear normal.     Nose: Congestion present.     Mouth/Throat:     Lips: Pink.     Mouth:  Mucous membranes are moist. No injury or oral lesions.     Dentition: Normal dentition.     Tongue: No lesions.     Pharynx: Oropharynx is clear. Uvula midline. No pharyngeal swelling, oropharyngeal exudate, posterior oropharyngeal erythema, uvula swelling or postnasal drip.     Tonsils: No tonsillar exudate.  Eyes:     General: Lids are normal. Vision grossly intact. Gaze aligned appropriately.     Extraocular Movements: Extraocular movements intact.     Conjunctiva/sclera: Conjunctivae normal.  Neck:     Trachea: Trachea and phonation normal.  Cardiovascular:     Rate and Rhythm: Regular rhythm. Tachycardia present.     Heart sounds: Normal heart sounds, S1 normal and S2 normal.  Pulmonary:     Effort: Pulmonary effort is normal. No respiratory distress.     Breath sounds: Normal breath sounds and air entry. No wheezing, rhonchi or rales.  Chest:     Chest wall: No tenderness.  Musculoskeletal:     Cervical back: Neck supple.     Right lower leg: No edema.     Left lower leg: No edema.  Lymphadenopathy:     Cervical: No cervical adenopathy.  Skin:    General: Skin is warm.     Capillary Refill: Capillary refill takes less than 2 seconds.     Findings: No rash.  Neurological:     General: No focal deficit present.     Mental Status: She is  alert and oriented to person, place, and time. Mental status is at baseline.     Cranial Nerves: No dysarthria or facial asymmetry.  Psychiatric:        Mood and Affect: Mood normal.        Speech: Speech normal.        Behavior: Behavior normal.        Thought Content: Thought content normal.        Judgment: Judgment normal.      UC Treatments / Results  Labs (all labs ordered are listed, but only abnormal results are displayed) Labs Reviewed  POCT FASTING CBG [facility] MANUAL ENTRY - Abnormal; Notable for the following components:      Result Value   POCT Glucose ([facility]) 182 (*)    All other components within normal limits     EKG   Radiology No results found.  Procedures Procedures (including critical care time)  Medications Ordered in UC Medications  acetaminophen (TYLENOL) tablet 975 mg (975 mg Oral Given 10/28/23 1704)  sodium chloride 0.9 % bolus 1,000 mL (1,000 mLs Intravenous New Bag/Given 10/28/23 1704)    Initial Impression / Assessment and Plan / UC Course  I have reviewed the triage vital signs and the nursing notes.  Pertinent labs & imaging results that were available during my care of the patient were reviewed by me and considered in my medical decision making (see chart for details).   1.  Influenza-like illness, dehydration, sinus tachycardia by electrocardiogram, shortness of breath Presentation concerning for SVT initially given symptoms and tachycardia.  EKG shows sinus tachycardia with nonspecific ST/T wave changes. Patient is ill-appearing and diaphoretic. Oxygen on room air 94%, 2 L of oxygen placed via nasal cannula due to concern for demand ischemia given elevated heart rate and shortness of breath.  Patient reports improvement in shortness of breath with oxygen. Lungs are clear. Recommend transport to the nearest emergency department for further workup and evaluation. [facility] called to transport patient to the nearest emergency room with patient's consent. IV placed by provider, IV fluid normal saline bolus started. Discharge from urgent care with EMS in stable condition.  Final Clinical Impressions(s) / UC Diagnoses   Final diagnoses:  Sinus tachycardia by electrocardiogram  Dehydration  Influenza-like illness  Shortness of breath   Discharge Instructions   None    ED Prescriptions   None    PDMP not reviewed this encounter.   Carlisle Beers, [place] 10/28/23 1735

## 2023-10-28 NOTE — ED Triage Notes (Signed)
Pt c/o sore throat, headache, SOB, chill, nausea, vomiting, diarrhea since yesterday.    Tylenol this morning around 5 am   BC powder this afternoon  She also has burn on left finger that she would like to be checked. Burn happened 3 days ago while she was cooking sausage.

## 2023-10-28 NOTE — H&P (Incomplete)
History and Physical  [facility] FAO:[id number] DOB: [birth year] DOA: 10/28/2023  Referring physician: Michaela Corner  PCP: Allegra Grana, FNP  Outpatient Specialists: None Patient coming from: Home through urgent care.  Chief Complaint: Headache, neck pain, sore throat.  HPI: Judith Drake is a 53 y.o. female with medical history significant for hypertension, GERD, chronic anxiety/depression, recently diagnosed with syphilis and completed 28 days of doxycycline about 5 days ago, who initially presents to urgent care with complaints of severe headache, neck pain, and sore throat that started yesterday.  Associated with subjective fevers and chills.  Endorses she had a stomach bug about a month ago as well as other household members.  In the ER, febrile with Tmax 102.8, tachycardic, tachypneic, influenza A&B by PCR, RSV by PCR, COVID-19 by PCR, all negative.  Leukocytosis with left shift.  Due to persistent symptomatology, EDP requested admission for further evaluation.  Admitted by [facility], hospitalist service.  ED Course: Tmax 100.8.  BP 140/90, pulse 109, respiration rate 20, O2 saturation 98% on room air.  Review of Systems: Review of systems as noted in the HPI. All other systems reviewed and are negative.   Past Medical History:  Diagnosis Date   Abnormal uterine bleeding (AUB) 03/20/2019   Acute lower UTI 05/12/2019   Acute respiratory failure with hypoxia (HCC) 04/10/2018   Anemia    Arthritis    lower back and hips   Dysmenorrhea 03/20/2019   Elevated liver enzymes 11/24/2013   ETOH abuse    Fatty liver 10/03/2019   Fibroid    GERD (gastroesophageal reflux disease)    Heavy menstrual period    Hypertension    Menorrhagia 12/13/2017   SIRS (systemic inflammatory response syndrome) (HCC) 05/12/2019   Past Surgical History:  Procedure Laterality Date   BACK SURGERY     CYSTOSCOPY N/A 12/19/2019   Procedure: CYSTOSCOPY;  Surgeon: [place] Bing, MD;  Location: [facility] OR;   Service: Gynecology;  Laterality: N/A;   ELBOW SURGERY     1997    ORIF ANKLE FRACTURE Right 03/25/2022   Procedure: OPEN REDUCTION INTERNAL FIXATION (ORIF) ANKLE FRACTURE;  Surgeon: Cammy Copa, MD;  Location: [facility] OR;  Service: Orthopedics;  Laterality: Right;   TOTAL LAPAROSCOPIC HYSTERECTOMY WITH SALPINGECTOMY Bilateral 12/19/2019   Procedure: TOTAL LAPAROSCOPIC HYSTERECTOMY WITH BILATERAL SALPINGECTOMY;  Surgeon: [place] Bing, MD;  Location: [facility] OR;  Service: Gynecology;  Laterality: Bilateral;   TUBAL LIGATION      Social History:  reports that she has been smoking cigarettes. She has never used smokeless tobacco. She reports current alcohol use. She reports that she does not use drugs.   Allergies  Allergen Reactions   Amoxicillin Nausea And Vomiting    Has patient had a PCN reaction causing immediate rash, facial/tongue/throat swelling, SOB or lightheadedness with hypotension: No Has patient had a PCN reaction causing severe rash involving mucus membranes or skin necrosis: No Has patient had a PCN reaction that required hospitalization: No Has patient had a PCN reaction occurring within the last 10 years: No If all of the above answers are "NO", then may proceed with Cephalosporin use.    Dilaudid [Hydromorphone Hcl] Itching   Morphine And Codeine Other (See Comments)    Hallucinations     Family History  Problem Relation Age of Onset   Cancer Mother 52       Lung    Hyperlipidemia Mother    Hypertension Mother    Stroke Mother    Kidney disease  Mother    Diabetes Mother    Heart disease Mother    Depression Mother    Hyperlipidemia Father    Cancer Paternal Grandfather 82       Colon Cancer - died   Autism Son    Breast cancer Neg Hx       Prior to Admission medications   Medication Sig Start Date End Date Taking? Authorizing Provider  amLODipine (NORVASC) 10 MG tablet Take 1 tablet (10 mg total) by mouth daily. 08/10/23  Yes Bethanie Dicker, NP   ascorbic acid (VITAMIN C) 500 MG tablet Take 500-1,000 mg by mouth daily.   Yes [provider]  Aspirin-Salicylamide-Caffeine (BC HEADACHE POWDER PO) Take 1 packet by mouth 2 (two) times daily as needed (for pain, headaches, or discomfort).   Yes [provider]  cetirizine (ZYRTEC) 10 MG tablet Take 1 tablet (10 mg total) by mouth daily. Patient taking differently: Take 10 mg by mouth daily as needed for allergies or rhinitis. 07/27/23  Yes Eustace Moore, MD  DULoxetine (CYMBALTA) 30 MG capsule Take 2 capsules (60 mg total) by mouth daily. 09/23/23  Yes Allegra Grana, FNP  ferrous sulfate 325 (65 FE) MG EC tablet Take 1 tablet (325 mg total) by mouth daily with breakfast. 08/17/23  Yes Arnett, Lyn Records, FNP  omeprazole (PRILOSEC) 20 MG capsule Take 1 capsule (20 mg total) by mouth daily. Patient taking differently: Take 20 mg by mouth daily before breakfast. 08/10/23  Yes Bethanie Dicker, NP  triamcinolone ointment (KENALOG) 0.5 % Apply 1 Application topically 2 (two) times daily. Use sparingly for < 1 week Patient taking differently: Apply 1 Application topically See admin instructions. Apply twice a day as directed to affected area-  use sparingly for less than 1 week 09/23/23  Yes Arnett, Lyn Records, FNP  doxycycline (VIBRA-TABS) 100 MG tablet Take 1 tablet (100 mg total) by mouth 2 (two) times daily for 28 days. Patient not taking: Reported on 10/28/2023 09/30/23 10/28/23  Allegra Grana, FNP  mupirocin ointment (BACTROBAN) 2 % Apply 1 Application topically 2 (two) times daily. Patient not taking: Reported on 10/28/2023 08/17/23   Allegra Grana, FNP  nystatin (MYCOSTATIN) 100000 UNIT/ML suspension Take 5 mLs (500,000 Units total) by mouth 4 (four) times daily. X 7 days. Swish around mouth and retain for as long as possible before swallowing. Patient not taking: Reported on 10/28/2023 08/10/23   Bethanie Dicker, NP  triamcinolone (KENALOG) 0.025 % cream Apply 1  Application topically 2 (two) times daily. Patient not taking: Reported on 10/28/2023 08/17/23   Allegra Grana, FNP    Physical Exam: BP (!) 143/91   Pulse (!) 109   Temp (!) 101.1 F (38.4 C) (Oral)   Resp 19   LMP  (LMP Unknown)   SpO2 94%   General: 53 y.o. year-old female well developed well nourished in no acute distress.  Alert and oriented x3. Cardiovascular: Regular rate and rhythm with no rubs or gallops.  No thyromegaly or JVD noted.  No lower extremity edema. 2/4 pulses in all 4 extremities. Respiratory: Clear to auscultation with no wheezes or rales. Good inspiratory effort. Abdomen: Soft nontender nondistended with normal bowel sounds x4 quadrants. Muskuloskeletal: No cyanosis, clubbing or edema noted bilaterally Neuro: CN II-XII intact, strength, sensation, reflexes Skin: No ulcerative lesions noted or rashes.  Skin burn lesion on left finger. Psychiatry: Judgement and insight appear normal. Mood is appropriate for condition and setting  Labs on Admission:  Basic Metabolic Panel: Recent Labs  Lab 10/28/23 1911  NA 134*  K 2.6*  CL 94*  CO2 25  GLUCOSE 121*  BUN 8  CREATININE 0.51  CALCIUM 9.1   Liver Function Tests: No results for input(s): "AST", "ALT", "ALKPHOS", "BILITOT", "PROT", "ALBUMIN" in the last 168 hours. No results for input(s): "LIPASE", "AMYLASE" in the last 168 hours. No results for input(s): "AMMONIA" in the last 168 hours. CBC: Recent Labs  Lab 10/28/23 1911  WBC 19.4*  NEUTROABS 16.9*  HGB 15.2*  HCT 46.1*  MCV 93.3  PLT 239   Cardiac Enzymes: No results for input(s): "CKTOTAL", "CKMB", "CKMBINDEX", "TROPONINI" in the last 168 hours.  BNP (last 3 results) No results for input(s): "BNP" in the last 8760 hours.  ProBNP (last 3 results) No results for input(s): "PROBNP" in the last 8760 hours.  CBG: No results for input(s): "GLUCAP" in the last 168 hours.  Radiological Exams on Admission: DG Chest 2  View Result Date: 10/28/2023 CLINICAL DATA:  Fever.  Flu-like symptoms. EXAM: CHEST - 2 VIEW COMPARISON:  Radiograph and CT 09/08/2022 FINDINGS: The cardiomediastinal contours are normal. Bronchial thickening. Pulmonary vasculature is normal. No consolidation, pleural effusion, or pneumothorax. Multiple remote bilateral rib fractures. No acute osseous abnormalities are seen. IMPRESSION: Bronchial thickening without focal airspace disease. Electronically Signed   By: Narda Rutherford M.D.   On: 10/28/2023 18:17    EKG: I independently viewed the EKG done and my findings are as followed: Sinus tachycardia rate of 152.  Nonspecific ST-T changes with QTc 534.  Assessment/Plan Present on Admission:  SIRS (systemic inflammatory response syndrome) (HCC)  Active Problems:   SIRS (systemic inflammatory response syndrome) (HCC)  SIRS with recent diagnosis of syphilis Self reported completed 28 days of doxycycline for syphilis Obtain RPR titers, follow peripheral blood cultures x 2 Started on Rocephin Consider ID involvement or close follow-up with infectious disease  Headache, neck pain Follow RPR titers if positive may consider neurology evaluation for possible LP to rule out neurosyphilis Will obtain MRI brain with contrast to rule out syphilitic meningitis or vascular inflammation Continue Rocephin As needed analgesics  Hypokalemia Serum potassium 2.6 Repleted intravenously Repeat BMP Check magnesium level  QTc prolongation QTc on admission twelve-lead EKG, 532 Avoid QTc prolonging agents Optimize magnesium and potassium levels. Monitor on telemetry.  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   DVT prophylaxis: SCDs  Code Status: Full code  Family Communication: None at bedside  Disposition Plan:  Admitted to telemetry unit  Consults called: None.  Admission status: Inpatient status.   Status is: Inpatient The patient requires at least 2 midnights for further evaluation and treatment of present condition.   Darlin Drop MD [facility] Pager [phone]  If 7PM-7AM, please contact night-coverage [email or web address] Password [facility]  10/28/2023, 11:39 PM

## 2023-10-29 ENCOUNTER — Inpatient Hospital Stay (HOSPITAL_COMMUNITY): Payer: 59

## 2023-10-29 ENCOUNTER — Other Ambulatory Visit: Payer: Self-pay

## 2023-10-29 DIAGNOSIS — B950 Streptococcus, group A, as the cause of diseases classified elsewhere: Secondary | ICD-10-CM

## 2023-10-29 DIAGNOSIS — R6510 Systemic inflammatory response syndrome (SIRS) of non-infectious origin without acute organ dysfunction: Secondary | ICD-10-CM

## 2023-10-29 DIAGNOSIS — D72829 Elevated white blood cell count, unspecified: Secondary | ICD-10-CM

## 2023-10-29 DIAGNOSIS — R509 Fever, unspecified: Secondary | ICD-10-CM

## 2023-10-29 DIAGNOSIS — Z880 Allergy status to penicillin: Secondary | ICD-10-CM | POA: Diagnosis not present

## 2023-10-29 LAB — BASIC METABOLIC PANEL
Anion gap: 13 (ref 5–15)
BUN: 5 mg/dL — ABNORMAL LOW (ref 6–20)
CO2: 25 mmol/L (ref 22–32)
Calcium: 8.8 mg/dL — ABNORMAL LOW (ref 8.9–10.3)
Chloride: 97 mmol/L — ABNORMAL LOW (ref 98–111)
Creatinine, Ser: 0.3 mg/dL — ABNORMAL LOW (ref 0.44–1.00)
GFR, Estimated: >60 mL/min (ref >60)
Glucose, Bld: 91 mg/dL (ref 70–99)
Potassium: 3.1 mmol/L — ABNORMAL LOW (ref 3.5–5.1)
Sodium: 135 mmol/L (ref 135–145)

## 2023-10-29 LAB — RESPIRATORY PANEL BY PCR

## 2023-10-29 LAB — PHOSPHORUS: Phosphorus: 2.7 mg/dL (ref 2.5–4.6)

## 2023-10-29 LAB — CBC
HCT: 43.5 % (ref 36.0–46.0)
Hemoglobin: 14 g/dL (ref 12.0–15.0)
MCH: 31.1 pg (ref 26.0–34.0)
MCHC: 32.2 g/dL (ref 30.0–36.0)
MCV: 96.7 fL (ref 80.0–100.0)
Platelets: 199 10*3/uL (ref 150–400)
RBC: 4.5 MIL/uL (ref 3.87–5.11)
RDW: 17.2 % — ABNORMAL HIGH (ref 11.5–15.5)
WBC: 14.8 10*3/uL — ABNORMAL HIGH (ref 4.0–10.5)
nRBC: 0 % (ref 0.0–0.2)

## 2023-10-29 LAB — URINALYSIS, ROUTINE W REFLEX MICROSCOPIC
Bacteria, UA: NONE SEEN
Bilirubin Urine: NEGATIVE
Glucose, UA: NEGATIVE mg/dL
Ketones, ur: NEGATIVE mg/dL
Leukocytes,Ua: NEGATIVE
Nitrite: NEGATIVE
Protein, ur: 30 mg/dL — AB
Specific Gravity, Urine: 1.012 (ref 1.005–1.030)
pH: 8 (ref 5.0–8.0)

## 2023-10-29 LAB — RPR
RPR Ser Ql: REACTIVE — AB
RPR Titer: 1:64 {titer}

## 2023-10-29 LAB — CSF CELL COUNT WITH DIFFERENTIAL
RBC Count, CSF: 3 /mm3 — ABNORMAL HIGH
Tube #: 4
WBC, CSF: 2 /mm3 (ref 0–5)

## 2023-10-29 LAB — STREP PNEUMONIAE URINARY ANTIGEN: Strep Pneumo Urinary Antigen: NEGATIVE

## 2023-10-29 LAB — GROUP A STREP BY PCR: Group A Strep by PCR: DETECTED — AB

## 2023-10-29 LAB — MAGNESIUM: Magnesium: 1.6 mg/dL — ABNORMAL LOW (ref 1.7–2.4)

## 2023-10-29 LAB — HIV ANTIBODY (ROUTINE TESTING W REFLEX): HIV Screen 4th Generation wRfx: NONREACTIVE

## 2023-10-29 LAB — PROTEIN AND GLUCOSE, CSF
Glucose, CSF: 65 mg/dL (ref 40–70)
Total  Protein, CSF: 45 mg/dL (ref 15–45)

## 2023-10-29 LAB — LACTIC ACID, PLASMA: Lactic Acid, Venous: 1.1 mmol/L (ref 0.5–1.9)

## 2023-10-29 MED ORDER — LORAZEPAM 2 MG/ML IJ SOLN
1.0000 mg | Freq: Once | INTRAMUSCULAR | Status: AC | PRN
  Administered 2023-10-29: 1 mg via INTRAVENOUS
  Filled 2023-10-29: qty 1

## 2023-10-29 MED ORDER — FERROUS SULFATE 325 (65 FE) MG PO TABS
325.0000 mg | ORAL_TABLET | Freq: Every day | ORAL | Status: DC
  Administered 2023-10-29 – 2023-10-30 (×2): 325 mg via ORAL
  Filled 2023-10-29 (×2): qty 1

## 2023-10-29 MED ORDER — POTASSIUM CHLORIDE 2 MEQ/ML IV SOLN
INTRAVENOUS | Status: AC
  Filled 2023-10-29 (×2): qty 1000

## 2023-10-29 MED ORDER — CARMEX CLASSIC LIP BALM EX OINT
TOPICAL_OINTMENT | CUTANEOUS | Status: DC | PRN
  Filled 2023-10-29: qty 10

## 2023-10-29 MED ORDER — DEXTROSE 5 % IV SOLN
10.0000 mg/kg | Freq: Three times a day (TID) | INTRAVENOUS | Status: DC
  Administered 2023-10-29: 750 mg via INTRAVENOUS
  Filled 2023-10-29 (×3): qty 15

## 2023-10-29 MED ORDER — MAGNESIUM SULFATE 2 GM/50ML IV SOLN
2.0000 g | Freq: Once | INTRAVENOUS | Status: AC
  Administered 2023-10-29: 2 g via INTRAVENOUS
  Filled 2023-10-29: qty 50

## 2023-10-29 MED ORDER — AMLODIPINE BESYLATE 5 MG PO TABS
10.0000 mg | ORAL_TABLET | Freq: Every day | ORAL | Status: DC
  Administered 2023-10-29 – 2023-10-30 (×2): 10 mg via ORAL
  Filled 2023-10-29 (×2): qty 2

## 2023-10-29 MED ORDER — GADOBUTROL 1 MMOL/ML IV SOLN
7.0000 mL | Freq: Once | INTRAVENOUS | Status: AC | PRN
  Administered 2023-10-29: 7 mL via INTRAVENOUS

## 2023-10-29 MED ORDER — POTASSIUM CHLORIDE 2 MEQ/ML IV SOLN: INTRAVENOUS | Status: DC

## 2023-10-29 MED ORDER — PANTOPRAZOLE SODIUM 40 MG PO TBEC
40.0000 mg | DELAYED_RELEASE_TABLET | Freq: Every day | ORAL | Status: DC
  Administered 2023-10-29 – 2023-10-30 (×2): 40 mg via ORAL
  Filled 2023-10-29 (×2): qty 1

## 2023-10-29 MED ORDER — POTASSIUM CHLORIDE CRYS ER 20 MEQ PO TBCR
40.0000 meq | EXTENDED_RELEASE_TABLET | Freq: Once | ORAL | Status: AC
  Administered 2023-10-29: 40 meq via ORAL
  Filled 2023-10-29: qty 2

## 2023-10-29 MED ORDER — PHENOL 1.4 % MT LIQD
1.0000 | OROMUCOSAL | Status: DC | PRN
  Administered 2023-10-29: 1 via OROMUCOSAL
  Filled 2023-10-29: qty 177

## 2023-10-29 MED ORDER — LACTATED RINGERS IV BOLUS
1000.0000 mL | Freq: Once | INTRAVENOUS | Status: AC
  Administered 2023-10-29: 1000 mL via INTRAVENOUS

## 2023-10-29 MED ORDER — DULOXETINE HCL 60 MG PO CPEP
60.0000 mg | ORAL_CAPSULE | Freq: Every day | ORAL | Status: DC
  Administered 2023-10-29 – 2023-10-30 (×2): 60 mg via ORAL
  Filled 2023-10-29 (×2): qty 1

## 2023-10-29 MED ORDER — SODIUM CHLORIDE 0.9 % IV SOLN
2.0000 g | Freq: Two times a day (BID) | INTRAVENOUS | Status: DC
  Administered 2023-10-29 – 2023-10-30 (×2): 2 g via INTRAVENOUS
  Filled 2023-10-29 (×2): qty 20

## 2023-10-29 NOTE — Procedures (Signed)
PROCEDURE SUMMARY:  Successful fluoroscopic guided lumbar puncture , L4-5 level via 20 gauge needle ;  9 cc  clear, colorless CSF obtained and sent for preordered labs; medication used-1% lidocaine to skin/SQ tissue No immediate complications.  Pt tolerated well.   EBL = none  Please see full dictation in imaging section of Epic for procedure details.

## 2023-10-29 NOTE — Plan of Care (Signed)

## 2023-10-29 NOTE — Progress Notes (Signed)
PROGRESS NOTE    Judith Drake  NWG:[id number] DOB: 1971/06/19 DOA: 10/28/2023 PCP: Judith Grana, FNP  Chief Complaint  Patient presents with   Influenza    Brief Narrative:   Judith Drake is Judith Drake 53 y.o. female with medical history significant for hypertension, GERD, chronic anxiety/depression, recently diagnosed with syphilis and completed 28 days of doxycycline about 5 days ago, who initially presents to urgent care with complaints of severe headache, neck pain, and sore throat that started yesterday.   Assessment & Plan:   Active Problems:   SIRS (systemic inflammatory response syndrome) (HCC)  Systemic Inflammatory Response Syndrome Recent treatment with doxycycline 100 mg BID x4 weeks for syphilis  With fever, tachycardia, leukocytosis  CXR without focal airspace disease (bronchial thickening) Need to rule out neurosyphilis with her headache/neck stiffness  Will c/s IR for LP  MRI brain pending Will consult ID in setting of her new fever in the setting of recent diagnosis of syphilis  Blood cultures pending.  Flu, RSV, covid negative.  Negative HIV. Positive RPR, awaiting T. Pallidum ab. Urinalysis pending Pending RVP Ceftriaxone for now Low threshold for additional imaging (CT chest/abd/pelvis)  Headache  Neck Pain As noted above, planning for LP to r/o neurosyphilis MRI brain pending  Hypokalemia Hypomagnesemia Replace and follow  Prolonged Qtc Repeat EKG  Hypertension Amlodipine  Anxiety  Depression Cymbalta     DVT prophylaxis: SCD Code Status: full Family Communication: none Disposition:   Status is: Inpatient Remains inpatient appropriate because: need for inpatient care   Consultants:  ID IR  Procedures:  none  Antimicrobials:  Anti-infectives (From admission, onward)    Start     Dose/Rate Route Frequency Ordered Stop   10/28/23 2345  cefTRIAXone (ROCEPHIN) 2 g in sodium chloride 0.9 % 100 mL IVPB        2 g 200  mL/hr over 30 Minutes Intravenous Every 24 hours 10/28/23 2336         Subjective: C/o headache - worse when she coughs  Objective: Vitals:   10/28/23 2015 10/28/23 2356 10/29/23 0340 10/29/23 0742  BP: (!) 143/91 (!) 140/94  (!) 156/100  Pulse: (!) 109 (!) 109 (!) 110 (!) 111  Resp:  20 20 20   Temp:  98.5 F (36.9 C) 98.3 F (36.8 C) 98.4 F (36.9 C)  TempSrc:   Axillary Oral  SpO2: 94% 99% 98% 98%  Weight:  75.2 kg    Height:  5\' 3"  (1.6 m)      Intake/Output Summary (Last 24 hours) at 10/29/2023 0956 Last data filed at 10/29/2023 0345 Gross per 24 hour  Intake 198.63 ml  Output --  Net 198.63 ml   Filed Weights   10/28/23 2356  Weight: 75.2 kg    Examination:  General exam: Appears calm and comfortable  Respiratory system: unlabored Cardiovascular system: RRR Gastrointestinal system: Abdomen is nondistended, soft and nontender Central nervous system: Alert and oriented. No focal neurological deficits. Extremities: no LEE - L middle finger wound   Data Reviewed: I have personally reviewed following labs and imaging studies  CBC: Recent Labs  Lab 10/28/23 1911 10/29/23 0604  WBC 19.4* 14.8*  NEUTROABS 16.9*  --   HGB 15.2* 14.0  HCT 46.1* 43.5  MCV 93.3 96.7  PLT 239 199    Basic Metabolic Panel: Recent Labs  Lab 10/28/23 1911 10/29/23 0604  NA 134* 135  K 2.6* 3.1*  CL 94* 97*  CO2 25 25  GLUCOSE 121* 91  BUN 8 5*  CREATININE 0.51 0.30*  CALCIUM 9.1 8.8*  MG  --  1.6*  PHOS  --  2.7    GFR: Estimated Creatinine Clearance: 80.8 mL/min (Pamlea Finder) (by C-G formula based on SCr of 0.3 mg/dL (L)).  Liver Function Tests: No results for input(s): "AST", "ALT", "ALKPHOS", "BILITOT", "PROT", "ALBUMIN" in the last 168 hours.  CBG: No results for input(s): "GLUCAP" in the last 168 hours.   Recent Results (from the past 240 hours)  Resp panel by RT-PCR (RSV, Flu Colston Pyle&B, Covid) Anterior Nasal Swab     Status: None   Collection Time: 10/28/23  5:51 PM    Specimen: Anterior Nasal Swab  Result Value Ref Range Status   SARS Coronavirus 2 by RT PCR NEGATIVE NEGATIVE Final    Comment: (NOTE) SARS-CoV-2 target nucleic acids are NOT DETECTED.  The SARS-CoV-2 RNA is generally detectable in upper respiratory specimens during the acute phase of infection. The lowest concentration of SARS-CoV-2 viral copies this assay can detect is 138 copies/mL. Jaclynne Baldo negative result does not preclude SARS-Cov-2 infection and should not be used as the sole basis for treatment or other patient management decisions. Archimedes Harold negative result may occur with  improper specimen collection/handling, submission of specimen other than nasopharyngeal swab, presence of viral mutation(s) within the areas targeted by this assay, and inadequate number of viral copies(<138 copies/mL). Oreatha Fabry negative result must be combined with clinical observations, patient history, and epidemiological information. The expected result is Negative.  Fact Sheet for Patients:  BloggerCourse.com  Fact Sheet for Healthcare Providers:  SeriousBroker.it  This test is no t yet approved or cleared by the [place] FDA and  has been authorized for detection and/or diagnosis of SARS-CoV-2 by FDA under an Emergency Use Authorization (EUA). This EUA will remain  in effect (meaning this test can be used) for the duration of the COVID-19 declaration under Section 564(b)(1) of the Act, 21 U.S.C.section 360bbb-3(b)(1), unless the authorization is terminated  or revoked sooner.       Influenza Ai Sonnenfeld by PCR NEGATIVE NEGATIVE Final   Influenza B by PCR NEGATIVE NEGATIVE Final    Comment: (NOTE) The Xpert Xpress SARS-CoV-2/FLU/RSV plus assay is intended as an aid in the diagnosis of influenza from Nasopharyngeal swab specimens and should not be used as Emogene Muratalla sole basis for treatment. Nasal washings and aspirates are unacceptable for Xpert Xpress  SARS-CoV-2/FLU/RSV testing.  Fact Sheet for Patients: BloggerCourse.com  Fact Sheet for Healthcare Providers: SeriousBroker.it  This test is not yet approved or cleared by the [place] FDA and has been authorized for detection and/or diagnosis of SARS-CoV-2 by FDA under an Emergency Use Authorization (EUA). This EUA will remain in effect (meaning this test can be used) for the duration of the COVID-19 declaration under Section 564(b)(1) of the Act, 21 U.S.C. section 360bbb-3(b)(1), unless the authorization is terminated or revoked.     Resp Syncytial Virus by PCR NEGATIVE NEGATIVE Final    Comment: (NOTE) Fact Sheet for Patients: BloggerCourse.com  Fact Sheet for Healthcare Providers: SeriousBroker.it  This test is not yet approved or cleared by the [place] FDA and has been authorized for detection and/or diagnosis of SARS-CoV-2 by FDA under an Emergency Use Authorization (EUA). This EUA will remain in effect (meaning this test can be used) for the duration of the COVID-19 declaration under Section 564(b)(1) of the Act, 21 U.S.C. section 360bbb-3(b)(1), unless the authorization is terminated or revoked.  Performed at [facility], [address]  [place], [place] [address]          Radiology Studies: DG Chest 2 View Result Date: 10/28/2023 CLINICAL DATA:  Fever.  Flu-like symptoms. EXAM: CHEST - 2 VIEW COMPARISON:  Radiograph and CT 09/08/2022 FINDINGS: The cardiomediastinal contours are normal. Bronchial thickening. Pulmonary vasculature is normal. No consolidation, pleural effusion, or pneumothorax. Multiple remote bilateral rib fractures. No acute osseous abnormalities are seen. IMPRESSION: Bronchial thickening without focal airspace disease. Electronically Signed   By: Narda Rutherford M.D.   On: 10/28/2023 18:17        Scheduled  Meds:  amLODipine  10 mg Oral Daily   DULoxetine  60 mg Oral Daily   ferrous sulfate  325 mg Oral Q breakfast   ondansetron (ZOFRAN) IV  4 mg Intravenous Once   pantoprazole  40 mg Oral Daily   Continuous Infusions:  cefTRIAXone (ROCEPHIN)  IV 2 g (10/29/23 0022)   lactated ringers 1,000 mL with potassium chloride 40 mEq infusion 50 mL/hr at 10/29/23 0806     LOS: 1 day    Time spent: over 30 min    Lacretia Nicks, MD [facility]   To contact the attending provider between 7A-7P or the covering provider during after hours 7P-7A, please log into the web site [email or web address] and access using universal  password for that web site. If you do not have the password, please call the hospital operator.  10/29/2023, 9:56 AM

## 2023-10-29 NOTE — Progress Notes (Signed)
Strep A pcr positive  Stop acyclovir  Continue ceftriaxone

## 2023-10-29 NOTE — Consult Note (Addendum)
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 - She had fever and leukocytosis on admission.  Fever resolved with ceftriaxone x 1 dose.  As such we will continue ceftriaxone. - Denies history of prior HSV outbreaks  Recommendations:  -Continue ceftriaxone 2 g every 12 hours, CNS dosing - Add acyclovir for HSV/VZV coverage - Follow LP studies - Follow-up blood cultures  #History of early syphilis #Penicillin allergy: Ampicillin during pregnancy caused hives - She had developed a rash on back on  08/17/2023 which was unchanged at 09/23/2023 visit.  S/syphilis testing performed.  She is sexually active with 1 partner, her husband.  She denies sexual activity since syphilis diagnosis. - Patient completed Doxy x 1 month, she had some nausea vomiting initially due to GI bug, may have been able to keep a couple doses down. - Initial RPR 1: 128, repeat 1 month later at this admission once: 64.  Expected fourfold decrease/2 full dilution in a few months. {;am - No plans to retreat at this time.   seems more consistent with aseptic meningitis versus possible bacterial/strep. - Acute hep panel on 2/4 NR , HIV nonreactive 10/28/2023 - Hep A and B serology  Dr. Drue Second is covering this weekend.  New ID team on Monday. Microbiology:   Antibiotics: Ceftriaxone 2/20-present  Cultures: Blood 2/20 Urine  Other   HPI: Judith Drake is a 53 y.o. 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  On arrival to the ED she had a temp of 102.8, WBC 10.4 K.  Ceftriaxone started.  Blood cultures obtained.  X-ray showed bronchial thickening with focal airspace disease.  MRI brain showed no evidence of acute intracranial abnormality, minor paranasal sinus mucosal thickening, bilateral mastoid effusions.  Patient states she had a stomach bug when she was initially taking doxycycline.  Completed Doxy a few days ago.   Review of Systems: Review of Systems  All other systems reviewed and are negative.   Past Medical History:  Diagnosis Date   Abnormal uterine bleeding (AUB) 03/20/2019   Acute lower UTI 05/12/2019   Acute respiratory failure with hypoxia (HCC) 04/10/2018   Anemia    Arthritis    lower back and hips   Dysmenorrhea 03/20/2019   Elevated liver enzymes 11/24/2013   ETOH abuse    Fatty liver 10/03/2019   Fibroid    GERD (gastroesophageal reflux disease)    Heavy menstrual period    Hypertension    Menorrhagia 12/13/2017   SIRS (systemic inflammatory response syndrome) (HCC) 05/12/2019    Social History   Tobacco Use   Smoking status: Some Days    Current packs/day: 0.50    Types: Cigarettes   Smokeless tobacco: Never  Vaping Use   Vaping status: Never Used  Substance Use Topics   Alcohol use: Yes    Comment:  She drinks vodka and cranberry, approx 6 -8 mini bottles per day   Drug use: No    Family History  Problem Relation Age of Onset   Cancer Mother 5       Lung    Hyperlipidemia Mother    Hypertension Mother    Stroke Mother    Kidney disease Mother    Diabetes Mother    Heart disease Mother    Depression Mother    Hyperlipidemia Father    Cancer Paternal Grandfather 36       Colon Cancer - died   Autism Son    Breast cancer Neg Hx    Scheduled Meds:  amLODipine  10 mg Oral Daily    DULoxetine  60 mg Oral Daily   ferrous sulfate  325 mg Oral Q breakfast   ondansetron (ZOFRAN) IV  4 mg Intravenous Once   pantoprazole  40 mg Oral Daily   Continuous Infusions:  acyclovir     cefTRIAXone (ROCEPHIN)  IV     lactated ringers 1,000 mL with potassium chloride 40 mEq infusion 50 mL/hr at 10/29/23 2956   magnesium sulfate bolus IVPB 2 g (10/29/23 1243)   PRN Meds:.acetaminophen, lip balm, melatonin, phenol, polyethylene glycol, prochlorperazine Allergies  Allergen Reactions   Amoxicillin Nausea And Vomiting    Has patient had a PCN reaction causing immediate rash, facial/tongue/throat swelling, SOB or lightheadedness with hypotension: No Has patient had a PCN reaction causing severe rash involving mucus membranes or skin necrosis: No Has patient had a PCN reaction that required hospitalization: No Has patient had a PCN reaction occurring within the last 10 years: No If all of the above answers are "NO", then may proceed with Cephalosporin use.    Dilaudid [Hydromorphone Hcl] Itching   Morphine And Codeine Other (See Comments)    Hallucinations     OBJECTIVE: Blood pressure (!) 150/88, pulse (!) 113, temperature 99.2 F (37.3 C), temperature source Oral, resp. rate 20, height 5\' 3"  (1.6 m), weight 75.2 kg, SpO2 97%.  Physical Exam Constitutional:      Appearance: Normal appearance.  HENT:     Head: Normocephalic and atraumatic.     Right Ear: Tympanic membrane normal.     Left Ear: Tympanic membrane normal.     Nose: Nose normal.     Mouth/Throat:     Mouth: Mucous membranes are moist.  Eyes:     Extraocular Movements: Extraocular movements intact.     Conjunctiva/sclera: Conjunctivae normal.     Pupils: Pupils are equal, round, and reactive to light.  Cardiovascular:     Rate and Rhythm: Normal rate and regular rhythm.     Heart sounds: No murmur heard.    No friction rub. No gallop.  Pulmonary:     Effort: Pulmonary effort is normal.     Breath sounds:  Normal breath sounds.  Abdominal:     General: Abdomen is flat.     Palpations: Abdomen is soft.  Musculoskeletal:        General: Normal range of motion.  Skin:    General: Skin is warm and dry.  Neurological:     General: No focal deficit present.     Mental Status: She is alert and oriented to person, place, and time.  Psychiatric:        Mood and Affect: Mood normal.     Lab Results Lab Results  Component Value Date   WBC 14.8 (H) 10/29/2023   HGB 14.0 10/29/2023  HCT 43.5 10/29/2023   MCV 96.7 10/29/2023   PLT 199 10/29/2023    Lab Results  Component Value Date   CREATININE 0.30 (L) 10/29/2023   BUN 5 (L) 10/29/2023   NA 135 10/29/2023   K 3.1 (L) 10/29/2023   CL 97 (L) 10/29/2023   CO2 25 10/29/2023    Lab Results  Component Value Date   ALT 34 08/10/2023   AST 32 08/10/2023   ALKPHOS 66 08/10/2023   BILITOT 0.6 08/10/2023       Danelle Earthly, MD [facility] 10/29/2023, 1:07 PM

## 2023-10-29 NOTE — Progress Notes (Signed)
Pharmacy Antibiotic Note  Judith Drake is a 53 y.o. female admitted on 10/28/2023 with fever, headache, neck pain and sore throat. Awaiting LP for r/o meningitis.   Pharmacy has been consulted for acyclovir dosing. Current Scr < 1.   Plan: Acyclovir 750 mg ( 10 mg/kg) every 8 hours Patient currently on LR with K @ 50 ml/hr through tomorrow AM Will re-order fluids tomorrow pending LP results  Monitor renal function, LP results   Height: 5\' 3"  (160 cm) Weight: 75.2 kg (165 lb 11.2 oz) IBW/kg (Calculated) : 52.4  Temp (24hrs), Avg:100 F (37.8 C), Min:98.3 F (36.8 C), Max:102.8 F (39.3 C)  Recent Labs  Lab 10/28/23 1911 10/28/23 2228 10/29/23 0014 10/29/23 0604  WBC 19.4*  --   --  14.8*  CREATININE 0.51  --   --  0.30*  LATICACIDVEN  --  1.1 1.1  --     Estimated Creatinine Clearance: 80.8 mL/min (A) (by C-G formula based on SCr of 0.3 mg/dL (L)).    Allergies  Allergen Reactions   Amoxicillin Nausea And Vomiting    Has patient had a PCN reaction causing immediate rash, facial/tongue/throat swelling, SOB or lightheadedness with hypotension: No Has patient had a PCN reaction causing severe rash involving mucus membranes or skin necrosis: No Has patient had a PCN reaction that required hospitalization: No Has patient had a PCN reaction occurring within the last 10 years: No If all of the above answers are "NO", then may proceed with Cephalosporin use.    Dilaudid [Hydromorphone Hcl] Itching   Morphine And Codeine Other (See Comments)    Hallucinations      Thank you for allowing pharmacy to be a part of this patient's care.  Sharin Mons, PharmD, BCPS, BCIDP Infectious Diseases Clinical Pharmacist Phone: [phone] 10/29/2023 11:05 AM

## 2023-10-30 ENCOUNTER — Other Ambulatory Visit (HOSPITAL_COMMUNITY): Payer: Self-pay

## 2023-10-30 DIAGNOSIS — J020 Streptococcal pharyngitis: Secondary | ICD-10-CM | POA: Diagnosis not present

## 2023-10-30 LAB — CBC
HCT: 46.6 % — ABNORMAL HIGH (ref 36.0–46.0)
Hemoglobin: 15.1 g/dL — ABNORMAL HIGH (ref 12.0–15.0)
MCH: 31.1 pg (ref 26.0–34.0)
MCHC: 32.4 g/dL (ref 30.0–36.0)
MCV: 95.9 fL (ref 80.0–100.0)
Platelets: 233 10*3/uL (ref 150–400)
RBC: 4.86 MIL/uL (ref 3.87–5.11)
RDW: 16.8 % — ABNORMAL HIGH (ref 11.5–15.5)
WBC: 11.9 10*3/uL — ABNORMAL HIGH (ref 4.0–10.5)
nRBC: 0 % (ref 0.0–0.2)

## 2023-10-30 LAB — BASIC METABOLIC PANEL
Anion gap: 12 (ref 5–15)
BUN: 7 mg/dL (ref 6–20)
CO2: 31 mmol/L (ref 22–32)
Calcium: 9.5 mg/dL (ref 8.9–10.3)
Chloride: 94 mmol/L — ABNORMAL LOW (ref 98–111)
Creatinine, Ser: 0.45 mg/dL (ref 0.44–1.00)
GFR, Estimated: >60 mL/min (ref >60)
Glucose, Bld: 106 mg/dL — ABNORMAL HIGH (ref 70–99)
Potassium: 4 mmol/L (ref 3.5–5.1)
Sodium: 137 mmol/L (ref 135–145)

## 2023-10-30 LAB — MENINGITIS/ENCEPHALITIS PANEL (CSF)

## 2023-10-30 LAB — HEPATITIS A ANTIBODY, TOTAL: hep A Total Ab: NONREACTIVE

## 2023-10-30 LAB — PHOSPHORUS: Phosphorus: 4.1 mg/dL (ref 2.5–4.6)

## 2023-10-30 LAB — MAGNESIUM: Magnesium: 2.1 mg/dL (ref 1.7–2.4)

## 2023-10-30 MED ORDER — ONDANSETRON HCL 4 MG PO TABS
4.0000 mg | ORAL_TABLET | Freq: Three times a day (TID) | ORAL | 0 refills | Status: AC | PRN
  Filled 2023-10-30: qty 12, 4d supply, fill #0

## 2023-10-30 MED ORDER — SODIUM CHLORIDE 0.9 % IV SOLN: 2.0000 g | INTRAVENOUS | Status: DC

## 2023-10-30 MED ORDER — CEFADROXIL 500 MG PO CAPS
1000.0000 mg | ORAL_CAPSULE | Freq: Every day | ORAL | 0 refills | Status: DC
  Filled 2023-10-30 (×2): qty 16, 8d supply, fill #0

## 2023-10-30 MED ORDER — CEFADROXIL 500 MG PO CAPS
1000.0000 mg | ORAL_CAPSULE | Freq: Two times a day (BID) | ORAL | 0 refills | Status: DC
Start: 2023-10-30 — End: 2023-10-30
  Filled 2023-10-30: qty 32, 8d supply, fill #0

## 2023-10-30 NOTE — Plan of Care (Signed)

## 2023-10-30 NOTE — Discharge Summary (Addendum)
 Physician Discharge Summary  SEVILLA MURTAGH FAO:[id number] DOB: 1971/05/08 DOA: 10/28/2023  PCP: Allegra Grana, FNP  Admit date: 10/28/2023 Discharge date: 10/30/2023  Time spent: 40 minutes  Recommendations for Outpatient Follow-up:  Follow outpatient CBC/CMP  Follow pending test - final blood cultures, final CSF cultures, VDRL, T pallidum ab Follow qt prolongation outpatient   Discharge Diagnoses:  Active Problems:   SIRS (systemic inflammatory response syndrome) (HCC)   Streptococcal pharyngitis   Discharge Condition: stable  Diet recommendation: heart healthy  Filed Weights   10/28/23 2356  Weight: 75.2 kg    History of present illness:  Judith Drake is Ryonna Cimini 53 y.o. female with medical history significant for hypertension, GERD, chronic anxiety/depression, recently diagnosed with syphilis and completed 28 days of doxycycline about 5 days ago, who initially presents to urgent care with complaints of severe headache, neck pain, and sore throat that started yesterday.   She's positive for group Brandelyn Henne strep.  Diagnosed with sepsis due to strep pharyngitis.  Improved on day of discharge, stable on 2/22.  Hospital Course:  Assessment and Plan:  Sepsis due to Strep Pharyngitis  Recent treatment with doxycycline 100 mg BID x4 weeks for syphilis  With fever, tachycardia, leukocytosis and positive group Cicero Noy strep by PCR CXR without focal airspace disease (bronchial thickening) S/p LP -> 2 WBC's, 3 RBC's - t protein 45, glucose 65 -> reassuring.  Negative meningitis/encephalitis panel. Pending VDRL. MRI brain without acute abnormality Blood cultures NGx1.  Flu, RSV, covid negative.  Negative RVP.  Negative HIV. Positive RPR, awaiting T. Pallidum ab. Urinalysis not concerning for UTI Ceftriaxone -> will discharge on duricef, plan for 10 day total course Low threshold for additional imaging (CT chest/abd/pelvis)  Headache  Neck Pain As above    Hypokalemia Hypomagnesemia Replace and follow   Prolonged Qtc Repeat EKG   Hypertension Amlodipine   Anxiety  Depression Cymbalta      Procedures: LP   Consultations: IR ID  Discharge Exam: Vitals:   10/30/23 0850 10/30/23 1344  BP: (!) 146/96 133/87  Pulse: 87 92  Resp: 17 20  Temp:  99.3 F (37.4 C)  SpO2: 98% 94%   Feels better today Ok with discharge  General: No acute distress. Posterior cervical TTP, submandibular fullness, mild TTP - erythema and edema to posterior pharynx  Cardiovascular: RRR Lungs: unlabored Abdomen: Soft, nontender, nondistended Neurological: Alert and oriented 3. Moves all extremities 4 with equal strength. Cranial nerves II through XII grossly intact. Extremities: No clubbing or cyanosis. No edema.  Discharge Instructions   Discharge Instructions     Call MD for:  difficulty breathing, headache or visual disturbances   Complete by: As directed    Call MD for:  extreme fatigue   Complete by: As directed    Call MD for:  hives   Complete by: As directed    Call MD for:  persistant dizziness or light-headedness   Complete by: As directed    Call MD for:  persistant nausea and vomiting   Complete by: As directed    Call MD for:  redness, tenderness, or signs of infection (pain, swelling, redness, odor or green/yellow discharge around incision site)   Complete by: As directed    Call MD for:  severe uncontrolled pain   Complete by: As directed    Call MD for:  temperature >100.4   Complete by: As directed    Diet - low sodium heart healthy   Complete by: As directed  Discharge instructions   Complete by: As directed    You were seen for sepsis (Barby Colvard severe infection).  You had an extensive workup including blood cultures and Lilee Aldea lumbar puncture.  Your studies showed that you have strep throat (or strep pharyngitis).  You have some pending tests at the time of discharge.  There are tests from the lumbar puncture that your  outpatient doctors can follow up on (the final culture and the VDRL test).  There are also other tests (T. Pallidum Ab and blood cultures) which should be followed with your outpatient provider.  We'll send you home with another 8 days of antibiotics (for Anthea Udovich total 10 day course).  Return for new, recurrent, or worsening symptoms.  Please ask your PCP to request records from this hospitalization so they know what was done and what the next steps will be.   Increase activity slowly   Complete by: As directed       Allergies as of 10/30/2023       Reactions   Amoxicillin Nausea And Vomiting   Has patient had Kinsleigh Ludolph PCN reaction causing immediate rash, facial/tongue/throat swelling, SOB or lightheadedness with hypotension: No Has patient had Janette Harvie PCN reaction causing severe rash involving mucus membranes or skin necrosis: No Has patient had Cynthia Cogle PCN reaction that required hospitalization: No Has patient had Carlisia Geno PCN reaction occurring within the last 10 years: No If all of the above answers are "NO", then may proceed with Cephalosporin use.   Dilaudid [hydromorphone Hcl] Itching   Morphine And Codeine Other (See Comments)   Hallucinations         Medication List     STOP taking these medications    doxycycline 100 MG tablet Commonly known as: VIBRA-TABS       TAKE these medications    amLODipine 10 MG tablet Commonly known as: NORVASC Take 1 tablet (10 mg total) by mouth daily.   ascorbic acid 500 MG tablet Commonly known as: VITAMIN C Take 500-1,000 mg by mouth daily.   BC HEADACHE POWDER PO Take 1 packet by mouth 2 (two) times daily as needed (for pain, headaches, or discomfort).   cefadroxil 500 MG capsule Commonly known as: DURICEF Take 2 capsules (1,000 mg total) by mouth daily for 8 days.   cetirizine 10 MG tablet Commonly known as: ZYRTEC Take 1 tablet (10 mg total) by mouth daily. What changed:  when to take this reasons to take this   DULoxetine 30 MG  capsule Commonly known as: Cymbalta Take 2 capsules (60 mg total) by mouth daily.   ferrous sulfate 325 (65 FE) MG EC tablet Take 1 tablet (325 mg total) by mouth daily with breakfast.   mupirocin ointment 2 % Commonly known as: BACTROBAN Apply 1 Application topically 2 (two) times daily.   nystatin 100000 UNIT/ML suspension Commonly known as: MYCOSTATIN Take 5 mLs (500,000 Units total) by mouth 4 (four) times daily. X 7 days. Swish around mouth and retain for as long as possible before swallowing.   omeprazole 20 MG capsule Commonly known as: PRILOSEC Take 1 capsule (20 mg total) by mouth daily. What changed: when to take this   ondansetron 4 MG tablet Commonly known as: Zofran Take 1 tablet (4 mg total) by mouth every 8 (eight) hours as needed for nausea or vomiting.   triamcinolone 0.025 % cream Commonly known as: KENALOG Apply 1 Application topically 2 (two) times daily. What changed: Another medication with the same name was changed. Make sure  you understand how and when to take each.   triamcinolone ointment 0.5 % Commonly known as: KENALOG Apply 1 Application topically 2 (two) times daily. Use sparingly for < 1 week What changed:  when to take this additional instructions       Allergies  Allergen Reactions   Amoxicillin Nausea And Vomiting    Has patient had Coltrane Tugwell PCN reaction causing immediate rash, facial/tongue/throat swelling, SOB or lightheadedness with hypotension: No Has patient had Lisa-Marie Rueger PCN reaction causing severe rash involving mucus membranes or skin necrosis: No Has patient had Leanda Padmore PCN reaction that required hospitalization: No Has patient had Helmer Dull PCN reaction occurring within the last 10 years: No If all of the above answers are "NO", then may proceed with Cephalosporin use.    Dilaudid [Hydromorphone Hcl] Itching   Morphine And Codeine Other (See Comments)    Hallucinations       The results of significant diagnostics from this hospitalization  (including imaging, microbiology, ancillary and laboratory) are listed below for reference.    Significant Diagnostic Studies: DG FL GUIDED LUMBAR PUNCTURE Result Date: 10/29/2023 CLINICAL DATA:  Patient with history of tertiary syphilis, recent fever, neck pain, headache, leukocytosis. EXAM: LUMBAR PUNCTURE UNDER FLUOROSCOPY PROCEDURE: An appropriate skin entry site was determined fluoroscopically. Operator donned sterile gloves and mask. Skin site was marked, then prepped with Betadine, draped in usual sterile fashion, and infiltrated locally with 1% lidocaine. Velinda Wrobel 20 gauge spinal needle advanced into the thecal sac initially at L3-4, then repositioned to L 4-5 from an interlaminar approach. Clear colorless CSF spontaneously returned. 9 ml CSF were collected and divided among 4 sterile vials for the requested laboratory studies. The needle was then removed. The patient tolerated the procedure well and there were no complications. FLUOROSCOPY: Radiation Exposure Index (as provided by the fluoroscopic device): 9.5 mGy Kerma IMPRESSION: Successful lumbar puncture under fluoroscopy. This exam was performed by Artemio Aly and was supervised and interpreted by Dr. Roanna Banning Electronically Signed   By: Roanna Banning M.D.   On: 10/29/2023 17:10   MR BRAIN W WO CONTRAST Result Date: 10/29/2023 CLINICAL DATA:  Provided history: Headache, sudden, severe. Neck pain. Recent syphylis. EXAM: MRI HEAD WITHOUT AND WITH CONTRAST TECHNIQUE: Multiplanar, multiecho pulse sequences of the brain and surrounding structures were obtained without and with intravenous contrast. CONTRAST:  7mL GADAVIST GADOBUTROL 1 MMOL/ML IV SOLN COMPARISON:  Brain MRI 01/16/2022. FINDINGS: Brain: Mild generalized cerebral atrophy. 7 mm pineal cyst. No cortical encephalomalacia is identified. No significant cerebral white matter disease. There is no acute infarct. No evidence of an intracranial mass. No chronic intracranial blood products. No  extra-axial fluid collection. No midline shift. No pathologic intracranial enhancement identified. Vascular: Maintained flow voids within the proximal large arterial vessels. Skull and upper cervical spine: No focal worrisome marrow lesion. Incompletely assessed cervical spondylosis. Sinuses/Orbits: No mass or acute finding within the imaged orbits. Minimal mucosal thickening within the bilateral ethmoid and right sphenoid sinuses. Other: Bilateral mastoid effusions. Cervical lymphadenopathy at the imaged levels. For instance, Amour Cutrone left level II lymph node measures 15 mm in short axis (series 7, image 19). IMPRESSION: 1. No evidence of an acute intracranial abnormality. 2. Mild generalized cerebral atrophy. 3. Unchanged 7 mm pineal cyst. 4. Otherwise unremarkable MRI appearance of the brain. 5. Minor paranasal sinus mucosal thickening. 6. Bilateral mastoid effusions. 7. Incompletely assessed cervical lymphadenopathy. Electronically Signed   By: Jackey Loge D.O.   On: 10/29/2023 10:21   DG Chest 2 View Result Date:  10/28/2023 CLINICAL DATA:  Fever.  Flu-like symptoms. EXAM: CHEST - 2 VIEW COMPARISON:  Radiograph and CT 09/08/2022 FINDINGS: The cardiomediastinal contours are normal. Bronchial thickening. Pulmonary vasculature is normal. No consolidation, pleural effusion, or pneumothorax. Multiple remote bilateral rib fractures. No acute osseous abnormalities are seen. IMPRESSION: Bronchial thickening without focal airspace disease. Electronically Signed   By: Narda Rutherford M.D.   On: 10/28/2023 18:17    Microbiology: Recent Results (from the past 240 hours)  Resp panel by RT-PCR (RSV, Flu Sylena Lotter&B, Covid) Anterior Nasal Swab     Status: None   Collection Time: 10/28/23  5:51 PM   Specimen: Anterior Nasal Swab  Result Value Ref Range Status   SARS Coronavirus 2 by RT PCR NEGATIVE NEGATIVE Final    Comment: (NOTE) SARS-CoV-2 target nucleic acids are NOT DETECTED.  The SARS-CoV-2 RNA is generally detectable  in upper respiratory specimens during the acute phase of infection. The lowest concentration of SARS-CoV-2 viral copies this assay can detect is 138 copies/mL. Taleya Whitcher negative result does not preclude SARS-Cov-2 infection and should not be used as the sole basis for treatment or other patient management decisions. Mariame Rybolt negative result may occur with  improper specimen collection/handling, submission of specimen other than nasopharyngeal swab, presence of viral mutation(s) within the areas targeted by this assay, and inadequate number of viral copies(<138 copies/mL). Markian Glockner negative result must be combined with clinical observations, patient history, and epidemiological information. The expected result is Negative.  Fact Sheet for Patients:  BloggerCourse.com  Fact Sheet for Healthcare Providers:  SeriousBroker.it  This test is no t yet approved or cleared by the [place] FDA and  has been authorized for detection and/or diagnosis of SARS-CoV-2 by FDA under an Emergency Use Authorization (EUA). This EUA will remain  in effect (meaning this test can be used) for the duration of the COVID-19 declaration under Section 564(b)(1) of the Act, 21 U.S.C.section 360bbb-3(b)(1), unless the authorization is terminated  or revoked sooner.       Influenza Shahan Starks by PCR NEGATIVE NEGATIVE Final   Influenza B by PCR NEGATIVE NEGATIVE Final    Comment: (NOTE) The Xpert Xpress SARS-CoV-2/FLU/RSV plus assay is intended as an aid in the diagnosis of influenza from Nasopharyngeal swab specimens and should not be used as Dasani Crear sole basis for treatment. Nasal washings and aspirates are unacceptable for Xpert Xpress SARS-CoV-2/FLU/RSV testing.  Fact Sheet for Patients: BloggerCourse.com  Fact Sheet for Healthcare Providers: SeriousBroker.it  This test is not yet approved or cleared by the [place] FDA and has  been authorized for detection and/or diagnosis of SARS-CoV-2 by FDA under an Emergency Use Authorization (EUA). This EUA will remain in effect (meaning this test can be used) for the duration of the COVID-19 declaration under Section 564(b)(1) of the Act, 21 U.S.C. section 360bbb-3(b)(1), unless the authorization is terminated or revoked.     Resp Syncytial Virus by PCR NEGATIVE NEGATIVE Final    Comment: (NOTE) Fact Sheet for Patients: BloggerCourse.com  Fact Sheet for Healthcare Providers: SeriousBroker.it  This test is not yet approved or cleared by the [place] FDA and has been authorized for detection and/or diagnosis of SARS-CoV-2 by FDA under an Emergency Use Authorization (EUA). This EUA will remain in effect (meaning this test can be used) for the duration of the COVID-19 declaration under Section 564(b)(1) of the Act, 21 U.S.C. section 360bbb-3(b)(1), unless the authorization is terminated or revoked.  Performed at [facility], [address], [place], [place] [address]  Culture, blood (Routine X 2) w Reflex to ID Panel     Status: None (Preliminary result)   Collection Time: 10/28/23 10:28 PM   Specimen: BLOOD RIGHT WRIST  Result Value Ref Range Status   Specimen Description   Final    BLOOD RIGHT WRIST Performed at [facility], [address] [place], [place] [address]    Special Requests   Final    BOTTLES DRAWN AEROBIC AND ANAEROBIC Blood Culture adequate volume Performed at [facility], [address] [place], [place] [address]    Culture   Final    NO GROWTH 1 DAY Performed at [facility], [address], [place], [place] [address]    Report Status PENDING  Incomplete  Culture, blood (Routine X 2) w Reflex to ID Panel     Status: None (Preliminary result)   Collection Time: 10/28/23 10:45 PM   Specimen: BLOOD  Result Value Ref Range  Status   Specimen Description   Final    BLOOD BLOOD LEFT FOREARM Performed at [facility], [address], [place], [place] [address]    Special Requests   Final    BOTTLES DRAWN AEROBIC AND ANAEROBIC Blood Culture adequate volume Performed at [facility], [address], [place], [place] [address]    Culture   Final    NO GROWTH 1 DAY Performed at [facility], [address], [place], [place] [address]    Report Status PENDING  Incomplete  CSF culture w Gram Stain     Status: None (Preliminary result)   Collection Time: 10/29/23  9:31 AM   Specimen: Lumbar Puncture; Cerebrospinal Fluid  Result Value Ref Range Status   Specimen Description   Final    CSF Performed at [facility], [address], [place], [place] [address]    Special Requests   Final    NONE Performed at [facility], [address], [place], [place] [address]    Gram Stain   Final    NO WBC SEEN NO ORGANISMS SEEN Performed at [facility], [address], [place], [place] [address]    Culture   Final    NO GROWTH < 12 HOURS Performed at [facility], [address] [place], [place] [address]    Report Status PENDING  Incomplete  Respiratory (~20 pathogens) panel by PCR     Status: None   Collection Time: 10/29/23  1:11 PM   Specimen: Nasopharyngeal Swab; Respiratory  Result Value Ref Range Status   Adenovirus NOT DETECTED NOT DETECTED Final   Coronavirus 229E NOT DETECTED NOT DETECTED Final    Comment: (NOTE) The Coronavirus on the Respiratory Panel, DOES NOT test for the novel  Coronavirus (2019 nCoV)    Coronavirus HKU1 NOT DETECTED NOT DETECTED Final   Coronavirus NL63 NOT DETECTED NOT DETECTED Final   Coronavirus OC43 NOT DETECTED NOT DETECTED Final   Metapneumovirus NOT DETECTED NOT DETECTED Final   Rhinovirus / Enterovirus NOT DETECTED NOT DETECTED Final   Influenza Kaytlin Burklow NOT DETECTED NOT DETECTED  Final   Influenza B NOT DETECTED NOT DETECTED Final   Parainfluenza Virus 1 NOT DETECTED NOT DETECTED Final   Parainfluenza Virus 2 NOT DETECTED NOT DETECTED Final   Parainfluenza Virus 3 NOT DETECTED NOT DETECTED Final   Parainfluenza Virus 4 NOT DETECTED NOT DETECTED Final   Respiratory Syncytial Virus NOT DETECTED NOT DETECTED Final  Bordetella pertussis NOT DETECTED NOT DETECTED Final   Bordetella Parapertussis NOT DETECTED NOT DETECTED Final   Chlamydophila pneumoniae NOT DETECTED NOT DETECTED Final   Mycoplasma pneumoniae NOT DETECTED NOT DETECTED Final    Comment: Performed at [facility], [address] [place], [place] [address]  Group Navraj Dreibelbis Strep by PCR     Status: Abnormal   Collection Time: 10/29/23  2:53 PM   Specimen: Throat; Sterile Swab  Result Value Ref Range Status   Group Jasmia Angst Strep by PCR DETECTED (Ariadna Setter) NOT DETECTED Final    Comment: Performed at [facility], [address] [place], [place] [address]     Labs: Basic Metabolic Panel: Recent Labs  Lab 10/28/23 1911 10/29/23 0604 10/30/23 0711  NA 134* 135 137  K 2.6* 3.1* 4.0  CL 94* 97* 94*  CO2 25 25 31   GLUCOSE 121* 91 106*  BUN 8 5* 7  CREATININE 0.51 0.30* 0.45  CALCIUM 9.1 8.8* 9.5  MG  --  1.6* 2.1  PHOS  --  2.7 4.1   Liver Function Tests: No results for input(s): "AST", "ALT", "ALKPHOS", "BILITOT", "PROT", "ALBUMIN" in the last 168 hours. No results for input(s): "LIPASE", "AMYLASE" in the last 168 hours. No results for input(s): "AMMONIA" in the last 168 hours. CBC: Recent Labs  Lab 10/28/23 1911 10/29/23 0604 10/30/23 0711  WBC 19.4* 14.8* 11.9*  NEUTROABS 16.9*  --   --   HGB 15.2* 14.0 15.1*  HCT 46.1* 43.5 46.6*  MCV 93.3 96.7 95.9  PLT 239 199 233   Cardiac Enzymes: No results for input(s): "CKTOTAL", "CKMB", "CKMBINDEX", "TROPONINI" in the last 168 hours. BNP: BNP (last 3 results) No results for input(s): "BNP" in the last 8760 hours.  ProBNP (last 3  results) No results for input(s): "PROBNP" in the last 8760 hours.  CBG: No results for input(s): "GLUCAP" in the last 168 hours.     Signed:  Lacretia Nicks MD.  [facility] 10/30/2023, 2:19 PM

## 2023-10-30 NOTE — Progress Notes (Signed)
 TOC meds in a secure bag delivered to pt in room  by this RN- Primary nurse in room when meds were delivered

## 2023-10-30 NOTE — TOC Initial Note (Signed)
 Transition of Care [facility]) - Initial/Assessment Note    Patient Details  Name: Judith Drake MRN: [id number] Date of Birth: 22-Aug-1971  Transition of [facility]) CM/SW Contact:    Adrian Prows, RN Phone Number: 10/30/2023, 2:35 PM  Clinical Narrative:                 Sherron Monday w/ pt in room; pt says she lives at home w/ her husband; she plans to return at d/c; she identified POC George Ina (father) [phone]; pt says her husband will provide transportation; pt verified insurance/PCP; she denies SDOH risks; pt says says she does not have DME, HH services, or home oxygen; no TOC needs.  Expected Discharge Plan: Home/Self Care Barriers to Discharge: No Barriers Identified   Patient Goals and CMS Choice Patient states their goals for this hospitalization and ongoing recovery are:: home CMS Medicare.gov Compare Post Acute Care list provided to:: Patient        Expected Discharge Plan and Services   Discharge Planning Services: CM Consult   Living arrangements for the past 2 months: Single Family Home Expected Discharge Date: 10/30/23               DME Arranged: N/A DME Agency: NA       HH Arranged: NA HH Agency: NA        Prior Living Arrangements/Services Living arrangements for the past 2 months: Single Family Home Lives with:: Spouse Patient language and need for interpreter reviewed:: Yes Do you feel safe going back to the place where you live?: Yes      Need for Family Participation in Patient Care: Yes (Comment) Care giver support system in place?: Yes (comment) Current home services:  (n/a) Criminal Activity/Legal Involvement Pertinent to Current Situation/Hospitalization: No - Comment as needed  Activities of Daily Living   ADL Screening (condition at time of admission) Independently performs ADLs?: Yes (appropriate for developmental age) Is the patient deaf or have difficulty hearing?: No Does the patient have difficulty seeing, even when  wearing glasses/contacts?: No Does the patient have difficulty concentrating, remembering, or making decisions?: No  Permission Sought/Granted Permission sought to share information with : Case Manager Permission granted to share information with : Yes, Verbal Permission Granted  Share Information with NAME: Case Manager     Permission granted to share info w Relationship: Birdena Crandall (father) [phone]     Emotional Assessment Appearance:: Appears stated age Attitude/Demeanor/Rapport: Gracious Affect (typically observed): Accepting Orientation: : Oriented to Self, Oriented to Place, Oriented to  Time, Oriented to Situation Alcohol / Substance Use: Not Applicable Psych Involvement: No (comment)  Admission diagnosis:  Hypokalemia [E87.6] Viral illness [B34.9] SIRS (systemic inflammatory response syndrome) (HCC) [R65.10] Nausea vomiting and diarrhea [R11.2, R19.7] Patient Active Problem List   Diagnosis Date Noted   Streptococcal pharyngitis 10/30/2023   SIRS (systemic inflammatory response syndrome) (HCC) 10/28/2023   DM (diabetes mellitus) (HCC) 08/25/2023   IDA (iron deficiency anemia) 08/17/2023   Vision changes 08/17/2023   Hoarseness of voice 08/10/2023   Muscle cramps at night 08/10/2023   Oral thrush 08/10/2023   Closed right ankle fracture 03/24/2022   Vitamin D deficiency 01/21/2022   HLD (hyperlipidemia) 01/21/2022   TIA (transient ischemic attack) 01/16/2022   OAB (overactive bladder) 01/14/2022   Hand pain 07/19/2020   Dysplasia of cervix, 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   Status post hysterectomy 12/19/2019   Elevated transaminase level 12/18/2019   Hypomagnesemia    Nonallopathic lesion of sacral region  03/28/2018   Nonallopathic lesion of thoracic region 03/28/2018   Nonallopathic lesion of lumbosacral region 03/28/2018   Nonallopathic lesion of pelvic region 03/28/2018   Nonallopathic lesion of cervical region 03/28/2018   Enlarged thyroid 03/07/2018    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   Lumbar radiculopathy, right 02/08/2018   Anxiety and depression 01/03/2018   Rash 01/03/2018   Right hip pain 01/03/2018   GERD (gastroesophageal reflux disease) 12/13/2017   Hypokalemia 12/13/2017   Depression 11/24/2013   Fatigue 07/12/2013   Essential hypertension 07/12/2013   Alcohol abuse 07/12/2013   Tobacco abuse 07/12/2013   Yeast infection of the skin 07/12/2013   Routine general medical examination at a health care facility 07/12/2013   PCP:  Allegra Grana, FNP Pharmacy:   [facility] - [place], [place] - [address] [place] [address] Phone: [phone] Fax: [phone]  [place] [facility] [address] [place] [address] Phone: [phone] Fax: [phone]     Social Drivers of Health (SDOH) Social History: SDOH Screenings   Food Insecurity: No Food Insecurity (10/30/2023)  Housing: Low Risk  (10/30/2023)  Transportation Needs: No Transportation Needs (10/30/2023)  Utilities: Not At Risk (10/30/2023)  Depression (PHQ2-9): Low Risk  (09/23/2023)  Tobacco Use: High Risk (10/28/2023)   SDOH Interventions: Food Insecurity Interventions: Intervention Not Indicated, Inpatient TOC Housing Interventions: Intervention Not Indicated, Inpatient TOC Transportation Interventions: Intervention Not Indicated, Inpatient TOC Utilities Interventions: Intervention Not Indicated, Inpatient TOC   Readmission Risk Interventions    10/30/2023    2:33 PM  Readmission Risk Prevention Plan  Transportation Screening Complete  PCP or Specialist Appt within 5-7 Days Complete  Home Care Screening Complete  Medication Review (RN CM) Complete

## 2023-11-01 LAB — VDRL, CSF: VDRL Quant, CSF: NONREACTIVE

## 2023-11-01 LAB — T.PALLIDUM AB, TOTAL: T Pallidum Abs: REACTIVE — AB

## 2023-11-02 ENCOUNTER — Ambulatory Visit: Payer: Self-pay | Admitting: Family Medicine

## 2023-11-02 LAB — CSF CULTURE W GRAM STAIN
Culture: NO GROWTH
Gram Stain: NONE SEEN

## 2023-11-02 LAB — HEPATITIS B SURFACE ANTIBODY, QUANTITATIVE: Hep B S AB Quant (Post): <3.5 m[IU]/mL — ABNORMAL LOW

## 2023-11-03 LAB — CULTURE, BLOOD (ROUTINE X 2)
Culture: NO GROWTH
Culture: NO GROWTH
Special Requests: ADEQUATE
Special Requests: ADEQUATE

## 2023-11-04 ENCOUNTER — Other Ambulatory Visit: Payer: Self-pay

## 2023-11-04 ENCOUNTER — Ambulatory Visit (INDEPENDENT_AMBULATORY_CARE_PROVIDER_SITE_OTHER): Payer: 59 | Admitting: Family

## 2023-11-04 ENCOUNTER — Inpatient Hospital Stay (HOSPITAL_COMMUNITY)
Admission: EM | Admit: 2023-11-04 | Discharge: 2023-11-10 | DRG: 554 | Disposition: A | Discharge status: Home / Self Care | Payer: 59 | Source: Ambulatory Visit | Attending: Internal Medicine | Admitting: Internal Medicine

## 2023-11-04 ENCOUNTER — Emergency Department (HOSPITAL_COMMUNITY): Payer: 59

## 2023-11-04 ENCOUNTER — Encounter (HOSPITAL_COMMUNITY): Payer: Self-pay

## 2023-11-04 ENCOUNTER — Encounter: Payer: Self-pay | Admitting: Family

## 2023-11-04 VITALS — BP 130/98 | HR 116 | Temp 99.9°F | Ht 63.0 in | Wt 162.0 lb

## 2023-11-04 DIAGNOSIS — R531 Weakness: Secondary | ICD-10-CM | POA: Diagnosis present

## 2023-11-04 DIAGNOSIS — M7731 Calcaneal spur, right foot: Secondary | ICD-10-CM | POA: Diagnosis not present

## 2023-11-04 DIAGNOSIS — M25572 Pain in left ankle and joints of left foot: Secondary | ICD-10-CM | POA: Diagnosis not present

## 2023-11-04 DIAGNOSIS — Z880 Allergy status to penicillin: Secondary | ICD-10-CM

## 2023-11-04 DIAGNOSIS — M2540 Effusion, unspecified joint: Secondary | ICD-10-CM | POA: Diagnosis not present

## 2023-11-04 DIAGNOSIS — Z823 Family history of stroke: Secondary | ICD-10-CM

## 2023-11-04 DIAGNOSIS — F419 Anxiety disorder, unspecified: Secondary | ICD-10-CM | POA: Diagnosis present

## 2023-11-04 DIAGNOSIS — S52122A Displaced fracture of head of left radius, initial encounter for closed fracture: Secondary | ICD-10-CM | POA: Diagnosis not present

## 2023-11-04 DIAGNOSIS — Z841 Family history of disorders of kidney and ureter: Secondary | ICD-10-CM

## 2023-11-04 DIAGNOSIS — R509 Fever, unspecified: Principal | ICD-10-CM

## 2023-11-04 DIAGNOSIS — M130 Polyarthritis, unspecified: Secondary | ICD-10-CM

## 2023-11-04 DIAGNOSIS — A539 Syphilis, unspecified: Secondary | ICD-10-CM

## 2023-11-04 DIAGNOSIS — F17210 Nicotine dependence, cigarettes, uncomplicated: Secondary | ICD-10-CM | POA: Diagnosis present

## 2023-11-04 DIAGNOSIS — R000 Tachycardia, unspecified: Secondary | ICD-10-CM

## 2023-11-04 DIAGNOSIS — Z8249 Family history of ischemic heart disease and other diseases of the circulatory system: Secondary | ICD-10-CM

## 2023-11-04 DIAGNOSIS — M19071 Primary osteoarthritis, right ankle and foot: Secondary | ICD-10-CM | POA: Diagnosis not present

## 2023-11-04 DIAGNOSIS — Z79899 Other long term (current) drug therapy: Secondary | ICD-10-CM

## 2023-11-04 DIAGNOSIS — Z818 Family history of other mental and behavioral disorders: Secondary | ICD-10-CM

## 2023-11-04 DIAGNOSIS — Z789 Other specified health status: Secondary | ICD-10-CM

## 2023-11-04 DIAGNOSIS — S2242XA Multiple fractures of ribs, left side, initial encounter for closed fracture: Secondary | ICD-10-CM | POA: Diagnosis not present

## 2023-11-04 DIAGNOSIS — A419 Sepsis, unspecified organism: Secondary | ICD-10-CM | POA: Diagnosis not present

## 2023-11-04 DIAGNOSIS — M2550 Pain in unspecified joint: Secondary | ICD-10-CM

## 2023-11-04 DIAGNOSIS — E876 Hypokalemia: Secondary | ICD-10-CM | POA: Diagnosis present

## 2023-11-04 DIAGNOSIS — M25571 Pain in right ankle and joints of right foot: Secondary | ICD-10-CM | POA: Diagnosis not present

## 2023-11-04 DIAGNOSIS — S82401A Unspecified fracture of shaft of right fibula, initial encounter for closed fracture: Secondary | ICD-10-CM | POA: Diagnosis not present

## 2023-11-04 DIAGNOSIS — K219 Gastro-esophageal reflux disease without esophagitis: Secondary | ICD-10-CM | POA: Diagnosis present

## 2023-11-04 DIAGNOSIS — D72829 Elevated white blood cell count, unspecified: Secondary | ICD-10-CM | POA: Diagnosis not present

## 2023-11-04 DIAGNOSIS — M359 Systemic involvement of connective tissue, unspecified: Secondary | ICD-10-CM

## 2023-11-04 DIAGNOSIS — M064 Inflammatory polyarthropathy: Principal | ICD-10-CM | POA: Diagnosis present

## 2023-11-04 DIAGNOSIS — Z83438 Family history of other disorder of lipoprotein metabolism and other lipidemia: Secondary | ICD-10-CM

## 2023-11-04 DIAGNOSIS — Z8619 Personal history of other infectious and parasitic diseases: Secondary | ICD-10-CM

## 2023-11-04 DIAGNOSIS — Z885 Allergy status to narcotic agent status: Secondary | ICD-10-CM

## 2023-11-04 DIAGNOSIS — Z90710 Acquired absence of both cervix and uterus: Secondary | ICD-10-CM

## 2023-11-04 DIAGNOSIS — F32A Depression, unspecified: Secondary | ICD-10-CM | POA: Diagnosis present

## 2023-11-04 DIAGNOSIS — Z1152 Encounter for screening for COVID-19: Secondary | ICD-10-CM

## 2023-11-04 DIAGNOSIS — M19011 Primary osteoarthritis, right shoulder: Secondary | ICD-10-CM | POA: Diagnosis not present

## 2023-11-04 DIAGNOSIS — Z800 Family history of malignant neoplasm of digestive organs: Secondary | ICD-10-CM

## 2023-11-04 DIAGNOSIS — K760 Fatty (change of) liver, not elsewhere classified: Secondary | ICD-10-CM | POA: Diagnosis present

## 2023-11-04 DIAGNOSIS — M19022 Primary osteoarthritis, left elbow: Secondary | ICD-10-CM | POA: Diagnosis not present

## 2023-11-04 DIAGNOSIS — Z833 Family history of diabetes mellitus: Secondary | ICD-10-CM

## 2023-11-04 DIAGNOSIS — I10 Essential (primary) hypertension: Secondary | ICD-10-CM | POA: Diagnosis present

## 2023-11-04 DIAGNOSIS — M7022 Olecranon bursitis, left elbow: Secondary | ICD-10-CM

## 2023-11-04 DIAGNOSIS — Z8781 Personal history of (healed) traumatic fracture: Secondary | ICD-10-CM

## 2023-11-04 DIAGNOSIS — R21 Rash and other nonspecific skin eruption: Secondary | ICD-10-CM | POA: Diagnosis present

## 2023-11-04 LAB — CBC WITH DIFFERENTIAL/PLATELET
Abs Immature Granulocytes: 0.18 10*3/uL — ABNORMAL HIGH (ref 0.00–0.07)
Basophils Absolute: 0.1 10*3/uL (ref 0.0–0.1)
Basophils Relative: 1 %
Eosinophils Absolute: 0 10*3/uL (ref 0.0–0.5)
Eosinophils Relative: 0 %
HCT: 47.1 % — ABNORMAL HIGH (ref 36.0–46.0)
Hemoglobin: 15.7 g/dL — ABNORMAL HIGH (ref 12.0–15.0)
Immature Granulocytes: 1 %
Lymphocytes Relative: 10 %
Lymphs Abs: 1.9 10*3/uL (ref 0.7–4.0)
MCH: 31.5 pg (ref 26.0–34.0)
MCHC: 33.3 g/dL (ref 30.0–36.0)
MCV: 94.6 fL (ref 80.0–100.0)
Monocytes Absolute: 1.1 10*3/uL — ABNORMAL HIGH (ref 0.1–1.0)
Monocytes Relative: 6 %
Neutro Abs: 14.9 10*3/uL — ABNORMAL HIGH (ref 1.7–7.7)
Neutrophils Relative %: 82 %
Platelets: 516 10*3/uL — ABNORMAL HIGH (ref 150–400)
RBC: 4.98 MIL/uL (ref 3.87–5.11)
RDW: 15.3 % (ref 11.5–15.5)
WBC: 18.2 10*3/uL — ABNORMAL HIGH (ref 4.0–10.5)
nRBC: 0 % (ref 0.0–0.2)

## 2023-11-04 LAB — BASIC METABOLIC PANEL
Anion gap: 16 — ABNORMAL HIGH (ref 5–15)
BUN: 9 mg/dL (ref 6–20)
CO2: 25 mmol/L (ref 22–32)
Calcium: 9.8 mg/dL (ref 8.9–10.3)
Chloride: 92 mmol/L — ABNORMAL LOW (ref 98–111)
Creatinine, Ser: 0.64 mg/dL (ref 0.44–1.00)
GFR, Estimated: >60 mL/min (ref >60)
Glucose, Bld: 121 mg/dL — ABNORMAL HIGH (ref 70–99)
Potassium: 3 mmol/L — ABNORMAL LOW (ref 3.5–5.1)
Sodium: 133 mmol/L — ABNORMAL LOW (ref 135–145)

## 2023-11-04 LAB — SEDIMENTATION RATE: Sed Rate: 66 mm/h — ABNORMAL HIGH (ref 0–22)

## 2023-11-04 LAB — HEPATIC FUNCTION PANEL
ALT: 50 U/L — ABNORMAL HIGH (ref 0–44)
AST: 50 U/L — ABNORMAL HIGH (ref 15–41)
Albumin: 3.9 g/dL (ref 3.5–5.0)
Alkaline Phosphatase: 79 U/L (ref 38–126)
Bilirubin, Direct: 0.2 mg/dL (ref 0.0–0.2)
Indirect Bilirubin: 1 mg/dL — ABNORMAL HIGH (ref 0.3–0.9)
Total Bilirubin: 1.2 mg/dL (ref 0.0–1.2)
Total Protein: 8.6 g/dL — ABNORMAL HIGH (ref 6.5–8.1)

## 2023-11-04 LAB — LACTIC ACID, PLASMA: Lactic Acid, Venous: 1.1 mmol/L (ref 0.5–1.9)

## 2023-11-04 LAB — RESP PANEL BY RT-PCR (RSV, FLU A&B, COVID)  RVPGX2
Influenza A by PCR: NEGATIVE
Influenza B by PCR: NEGATIVE
Resp Syncytial Virus by PCR: NEGATIVE
SARS Coronavirus 2 by RT PCR: NEGATIVE

## 2023-11-04 LAB — CK: Total CK: 42 U/L (ref 38–234)

## 2023-11-04 MED ORDER — FENTANYL CITRATE PF 50 MCG/ML IJ SOSY
100.0000 ug | PREFILLED_SYRINGE | Freq: Once | INTRAMUSCULAR | Status: AC
  Administered 2023-11-04: 100 ug via INTRAVENOUS
  Filled 2023-11-04: qty 2

## 2023-11-04 MED ORDER — VANCOMYCIN HCL 1500 MG/300ML IV SOLN
1500.0000 mg | Freq: Once | INTRAVENOUS | Status: AC
  Administered 2023-11-05: 1500 mg via INTRAVENOUS
  Filled 2023-11-04: qty 300

## 2023-11-04 MED ORDER — OXYCODONE-ACETAMINOPHEN 5-325 MG PO TABS
1.0000 | ORAL_TABLET | Freq: Once | ORAL | Status: AC
  Administered 2023-11-04: 1 via ORAL
  Filled 2023-11-04: qty 1

## 2023-11-04 MED ORDER — SODIUM CHLORIDE 0.9 % IV SOLN
2.0000 g | Freq: Once | INTRAVENOUS | Status: AC
  Administered 2023-11-04: 2 g via INTRAVENOUS
  Filled 2023-11-04: qty 20

## 2023-11-04 MED ORDER — HYDROCODONE-ACETAMINOPHEN 5-325 MG PO TABS: 1.0000 | ORAL_TABLET | Freq: Once | ORAL | Status: DC

## 2023-11-04 MED ORDER — ONDANSETRON HCL 4 MG/2ML IJ SOLN
4.0000 mg | Freq: Once | INTRAMUSCULAR | Status: AC
  Administered 2023-11-04: 4 mg via INTRAVENOUS
  Filled 2023-11-04: qty 2

## 2023-11-04 MED ORDER — VANCOMYCIN HCL 1500 MG/300ML IV SOLN
1500.0000 mg | INTRAVENOUS | Status: DC
  Administered 2023-11-05 – 2023-11-06 (×2): 1500 mg via INTRAVENOUS
  Filled 2023-11-04 (×2): qty 300

## 2023-11-04 NOTE — Progress Notes (Addendum)
 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

## 2023-11-04 NOTE — Assessment & Plan Note (Signed)
 Reviewed hospitalization course.  Medications reconciled.  She completed 1 month of doxycycline as an outpatient.  RPR titer 1:128--> 1:64 Plan to repeat titers in 3 months time.

## 2023-11-04 NOTE — H&P (Signed)
 History and Physical    Judith Drake ZOX:[id number] DOB: 25-Sep-1970 DOA: 11/04/2023  PCP: Allegra Grana, FNP  Patient coming from: Home  I have personally briefly reviewed patient's old medical records in [facility]  Chief Complaint: Fevers, chills, right ankle and left elbow swelling/pain  HPI: Judith Drake is a 53 y.o. female with medical history significant for HTN, depression/anxiety, alcohol use, s/p ORIF right ankle (03/2022), recent diagnosis of syphilis s/p 28 days of doxycycline, and recent admission for sepsis due to strep pharyngitis (treated with ceftriaxone>> Duricef) who presented to the ED for evaluation of polyarthropathy with fevers and chills.  Patient with recent diagnosis of syphilis.  She completed 28 days of doxycycline (penicillin allergy).  She was then recently admitted from 2/20-2/22 for sepsis due to group A strep pharyngitis.  She was treated with IV ceftriaxone while in hospital and discharged to home to complete a course of antibiotics with cefadroxil.  Patient states that she had been feeling much better when she was discharged however over the last 3 days she has developed pain and swelling in multiple joint spaces.  Initially pain and swelling began in her right ankle where she had prior ORIF in 2023.  Then she began to have pain and swelling in her left elbow which she also had remote surgery about 30 years ago.  Today she has developed right shoulder pain (no prior surgery in this area).  She has had fevers, chills, and malaise.  She also reports a petechial rash involving her extremities which she first noticed 2 weeks ago.  She has not had any pain or pruritus associated with this rash.  ED Course  Labs/Imaging on admission: I have personally reviewed following labs and imaging studies.  Initial vitals showed BP 130/98, pulse 116, RR 18, temp 99.9 F, SpO2 96% on room air.  Tmax 100.4 F.  Labs showed WBC 18.2, hemoglobin 15.7,  platelets 516,000, sodium 133, potassium 3.0, bicarb 25, BUN 9, creatinine 0.64, serum glucose 121, CK 42, ESR 66, CRP in process, lactic acid 1.1.  SARS-CoV-2, influenza, RSV PCR negative.  Right ankle x-ray showed ORIF of prior healed distal fibula fracture.  No evidence of orthopedic hardware failure or loosening.  Diffuse soft tissue swelling greatest within the anterior and lateral ankle.  CXR, right shoulder x-ray, left elbow x-ray, and MRI right ankle pending.  EDP discussed with orthopedics (Dr. Christell Constant) who will see in AM.  EDP also discussed with ID (Dr. Renold Don) who recommended testing for GC/chlamydia, start empiric vancomycin and ceftriaxone, and ID will see in a.m.  The hospitalist service was consulted to admit.  Review of Systems: All systems reviewed and are negative except as documented in history of present illness above.   Past Medical History:  Diagnosis Date   Abnormal uterine bleeding (AUB) 03/20/2019   Acute lower UTI 05/12/2019   Acute respiratory failure with hypoxia (HCC) 04/10/2018   Anemia    Arthritis    lower back and hips   Dysmenorrhea 03/20/2019   Elevated liver enzymes 11/24/2013   ETOH abuse    Fatty liver 10/03/2019   Fibroid    GERD (gastroesophageal reflux disease)    Heavy menstrual period    Hypertension    Menorrhagia 12/13/2017   SIRS (systemic inflammatory response syndrome) (HCC) 05/12/2019    Past Surgical History:  Procedure Laterality Date   BACK SURGERY     CYSTOSCOPY N/A 12/19/2019   Procedure: CYSTOSCOPY;  Surgeon: Terryville Bing, MD;  Location: [facility] OR;  Service: Gynecology;  Laterality: N/A;   ELBOW SURGERY     1997    ORIF ANKLE FRACTURE Right 03/25/2022   Procedure: OPEN REDUCTION INTERNAL FIXATION (ORIF) ANKLE FRACTURE;  Surgeon: Cammy Copa, MD;  Location: [facility] OR;  Service: Orthopedics;  Laterality: Right;   TOTAL LAPAROSCOPIC HYSTERECTOMY WITH SALPINGECTOMY Bilateral 12/19/2019   Procedure: TOTAL LAPAROSCOPIC HYSTERECTOMY WITH  BILATERAL SALPINGECTOMY;  Surgeon: Weston Bing, MD;  Location[facility] OR;  Service: Gynecology;  Laterality: Bilateral;   TUBAL LIGATION      Social History:  reports that she has been smoking cigarettes. She has never used smokeless tobacco. She reports current alcohol use. She reports that she does not use drugs.  Allergies  Allergen Reactions   Amoxicillin Nausea And Vomiting    Has patient had a PCN reaction causing immediate rash, facial/tongue/throat swelling, SOB or lightheadedness with hypotension: No Has patient had a PCN reaction causing severe rash involving mucus membranes or skin necrosis: No Has patient had a PCN reaction that required hospitalization: No Has patient had a PCN reaction occurring within the last 10 years: No If all of the above answers are "NO", then may proceed with Cephalosporin use.    Dilaudid [Hydromorphone Hcl] Itching   Morphine And Codeine Other (See Comments)    Hallucinations     Family History  Problem Relation Age of Onset   Cancer Mother 60       Lung    Hyperlipidemia Mother    Hypertension Mother    Stroke Mother    Kidney disease Mother    Diabetes Mother    Heart disease Mother    Depression Mother    Hyperlipidemia Father    Cancer Paternal Grandfather 61       Colon Cancer - died   Autism Son    Breast cancer Neg Hx      Prior to Admission medications   Medication Sig Start Date End Date Taking? Authorizing Provider  amLODipine (NORVASC) 10 MG tablet Take 1 tablet (10 mg total) by mouth daily. 08/10/23   Bethanie Dicker, NP  ascorbic acid (VITAMIN C) 500 MG tablet Take 500-1,000 mg by mouth daily.    [provider]  Aspirin-Salicylamide-Caffeine (BC HEADACHE POWDER PO) Take 1 packet by mouth 2 (two) times daily as needed (for pain, headaches, or discomfort).    [provider]  cefadroxil (DURICEF) 500 MG capsule Take 2 capsules (1,000 mg total) by mouth daily for 8 days. 10/30/23 11/07/23  Zigmund Daniel., MD  cetirizine (ZYRTEC) 10 MG tablet Take 1 tablet (10 mg total) by mouth daily. Patient taking differently: Take 10 mg by mouth daily as needed for allergies or rhinitis. 07/27/23   Eustace Moore, MD  DULoxetine (CYMBALTA) 30 MG capsule Take 2 capsules (60 mg total) by mouth daily. 09/23/23   Allegra Grana, FNP  ferrous sulfate 325 (65 FE) MG EC tablet Take 1 tablet (325 mg total) by mouth daily with breakfast. 08/17/23   Allegra Grana, FNP  mupirocin ointment (BACTROBAN) 2 % Apply 1 Application topically 2 (two) times daily. Patient not taking: Reported on 10/28/2023 08/17/23   Allegra Grana, FNP  nystatin (MYCOSTATIN) 100000 UNIT/ML suspension Take 5 mLs (500,000 Units total) by mouth 4 (four) times daily. X 7 days. Swish around mouth and retain for as long as possible before swallowing. Patient not taking: Reported on 10/28/2023 08/10/23   Bethanie Dicker, NP  omeprazole (PRILOSEC) 20 MG  capsule Take 1 capsule (20 mg total) by mouth daily. Patient taking differently: Take 20 mg by mouth daily before breakfast. 08/10/23   Bethanie Dicker, NP  ondansetron (ZOFRAN) 4 MG tablet Take 1 tablet (4 mg total) by mouth every 8 (eight) hours as needed for nausea or vomiting. 10/30/23   Zigmund Daniel., MD  triamcinolone (KENALOG) 0.025 % cream Apply 1 Application topically 2 (two) times daily. Patient not taking: Reported on 10/28/2023 08/17/23   Allegra Grana, FNP  triamcinolone ointment (KENALOG) 0.5 % Apply 1 Application topically 2 (two) times daily. Use sparingly for < 1 week Patient not taking: Reported on 11/04/2023 09/23/23   Allegra Grana, FNP    Physical Exam: Vitals:   11/04/23 1757 11/04/23 2029 11/04/23 2347  BP: 129/83 (!) 148/92 (!) 147/94  Pulse: (!) 116 (!) 108 (!) 110  Resp: 18 18 16   Temp: 100 F (37.8 C) 99.5 F (37.5 C) (!) 100.4 F (38 C)  TempSrc: Oral Oral Oral  SpO2: 100% 99% 94%   Constitutional: Resting in bed, appears fatigued and  somewhat uncomfortable Eyes: EOMI, lids and conjunctivae normal ENMT: Mucous membranes are moist. Posterior pharynx clear of any exudate or lesions.Normal dentition.  Neck: normal, supple, no masses. Respiratory: clear to auscultation bilaterally, no wheezing, no crackles. Normal respiratory effort. No accessory muscle use.  Cardiovascular: Regular rate and rhythm, no murmurs / rubs / gallops. No pedal edema. 2+ pedal pulses. Abdomen: no tenderness, no masses palpated. Musculoskeletal: Joint swelling and tenderness to palpation involving the right ankle, left elbow, and right shoulder.  Slightly diminished ROM of right shoulder. Skin: Petechial rash bilateral lower extremities Neurologic: Sensation intact. Strength 5/5 in all 4.  Psychiatric: Alert and oriented x 3. Normal mood.             EKG: Personally reviewed. Sinus tachycardia, rate 116.  Assessment/Plan Principal Problem:   Sepsis (HCC) Active Problems:   Anxiety and depression   Polyarthropathy   Judith Drake is a 53 y.o. female with medical history significant for HTN, depression/anxiety, alcohol use, s/p ORIF right ankle (03/2022), recent diagnosis of syphilis s/p 28 days of doxycycline, and recent admission for sepsis due to strep pharyngitis (treated with ceftriaxone>> Duricef) who is admitted with polyarthropathy meeting sepsis criteria.  Assessment and Plan: Sepsis due to polyarthropathy: Met sepsis criteria with leukocytosis, tachycardia, fever.  Recent treatment for syphilis and group A strep pharyngitis.  Now with pain/swelling involving right ankle, right shoulder, left elbow. -ID and orthopedics to consult in a.m. -Started on empiric IV vancomycin and ceftriaxone -Follow blood cultures -Check GC/chlamydia, HIV, full respiratory viral panel -IV fluid hydration overnight  Recent latent syphilis: Completed 28 days doxycycline.  LP during recent admit with nonreactive VDRL.  T. pallidum antibody was  reactive.  RPR also reactive with titer 1:64 compared to previous 1:128.  Recent strep a pharyngitis: Treated with IV ceftriaxone in hospital and discharged on cefadroxil.  Hypokalemia: Supplementing.  Hypertension: Continue amlodipine.  Depression/anxiety: Continue Cymbalta.   DVT prophylaxis: SCDs Start: 11/05/23 0123 Code Status: Full code, confirmed with patient on admission Family Communication: Discussed with patient, she has discussed with family Disposition Plan: From home, dispo pending clinical progress Consults called: ID, orthopedics Severity of Illness: The appropriate patient status for this patient is INPATIENT. Inpatient status is judged to be reasonable and necessary in order to provide the required intensity of service to ensure the patient's safety. The patient's presenting symptoms, physical exam findings, and initial  radiographic and laboratory data in the context of their chronic comorbidities is felt to place them at high risk for further clinical deterioration. Furthermore, it is not anticipated that the patient will be medically stable for discharge from the hospital within 2 midnights of admission.   * I certify that at the point of admission it is my clinical judgment that the patient will require inpatient hospital care spanning beyond 2 midnights from the point of admission due to high intensity of service, high risk for further deterioration and high frequency of surveillance required.Darreld Mclean MD [facility]  If 7PM-7AM, please contact night-coverage [email or web address]  11/05/2023, 1:37 AM

## 2023-11-04 NOTE — ED Triage Notes (Signed)
 Reports all over pain in body with worsening pain in right ankle. Pt had ankle surgery a year ago and has not had any issues with it. PCP sent pt concerned for septic joint. Body aches, fever, chills. Weakness, fatigue.

## 2023-11-04 NOTE — Assessment & Plan Note (Signed)
 Acute joint swelling left elbow, right ankle.  History of hardware however it is of concern that acutely over the last 3 days both joints with suddenly enlarge.  Patient looks very uncomfortable.  She endorses chills, low-grade fever.   She is tachycardic in the office.  Of note, monitor bilateral dorsal feet petechial rash ( ?strep) Concern for sepsis, septic joint.   Advised to return to [facility] ED for prompt evaluation, labs ,imaging.  Consider ID , orthopedic consult after discharge.

## 2023-11-04 NOTE — Progress Notes (Signed)
 Assessment & Plan:  Joint swelling Assessment & Plan: Acute joint swelling left elbow, right ankle.  History of hardware however it is of concern that acutely over the last 3 days both joints with suddenly enlarge.  Patient looks very uncomfortable.  She endorses chills, low-grade fever.   She is tachycardic in the office.  Of note, monitor bilateral dorsal feet petechial rash ( ?strep) Concern for sepsis, septic joint.   Advised to return to [facility] ED for prompt evaluation, labs ,imaging.  Consider ID , orthopedic consult after discharge.    Hypokalemia  Syphilis  Syphilis, unspecified Assessment & Plan: Reviewed hospitalization course.  Medications reconciled.  She completed 1 month of doxycycline as an outpatient.  RPR titer 1:128--> 1:64 Plan to repeat titers in 3 months time.       Return precautions given.   Risks, benefits, and alternatives of the medications and treatment plan prescribed today were discussed, and patient expressed understanding.   Education regarding symptom management and diagnosis given to patient on AVS either electronically or printed.  Return in about 1 month (around 12/02/2023).  Rennie Plowman, FNP  Subjective:    Patient ID: Sheilah Mins, female    DOB: 1970-12-15, 53 y.o.   MRN: [id number]  CC: MAKHYA ARAVE is a 53 y.o. female who presents today for follow up.   HPI: Complains of  right ankle pain and swelling x 3 days This coincides with left elbow swelling and pain.  She also reports neck pain and swelling and difficulty turning her head. Endorses chills.  She has been taking ibuprofen.  No new injury   She has a rash on bilateral dorsal aspect of her feet.  Non tender.  It  been there for several weeks.  Prior rash across back has completely resolved   Status post lumbar puncture.   H/o right ankle fracture ; left elbow injury   Discharged on cefadroxil 1000mg  every day for 8 days, she 2 days left.    She completed doxycycline 4 week course.   presented urgent care 10/28/2023 for influenza-like illness, shortness of breath, tachycardia.  She discharged to the emergency room   Presented to [facility] emergency room 10/28/2023 and discharged 10/30/2023 Admitted for Sepsis due to strep pharyngitis Chest x-ray without focal airspace disease.   MRI brain without acute abnormality.  Urinalysis not concerning for UTI.  Hospital course significant for hypokalemia CSF gram stain no WBCs seen, no organisms seen Clear CSR 10/29/23 Blood culture , no growth in 5 days RPR titer 1:128--> 1:64  WBC 11.9 K 2.6--> 3.1--> 4.0 Group A Strep positive   Consult infectious disease, Dr. 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  Added acyclovir for HSV/VZV coverage.  Follow LP studies, follow blood cultures. No plans to retreat syphilis.  Seems more consistent with aseptic meningitis versus possible bacterial/strep  Allergies: Amoxicillin, Dilaudid [hydromorphone hcl], and Morphine and codeine Current Outpatient Medications on File Prior to Visit  Medication Sig Dispense Refill   amLODipine (NORVASC) 10 MG tablet Take 1 tablet (10 mg total) by mouth daily. 90 tablet 3   ascorbic acid (VITAMIN C) 500 MG tablet Take 500-1,000 mg by mouth daily.     Aspirin-Salicylamide-Caffeine (BC HEADACHE POWDER PO) Take 1 packet by mouth 2 (two) times daily as needed (for pain, headaches, or discomfort).     cefadroxil (DURICEF) 500 MG capsule Take 2 capsules (1,000 mg total) by mouth daily for 8  days. 16 capsule 0   cetirizine (ZYRTEC) 10 MG tablet Take 1 tablet (10 mg total) by mouth daily. (Patient taking differently: Take 10 mg by mouth daily as needed for allergies or rhinitis.) 30 tablet 0   DULoxetine (CYMBALTA) 30 MG capsule Take 2 capsules (60 mg total) by mouth daily. 90 capsule 1   ferrous sulfate 325 (65 FE) MG EC tablet Take 1 tablet (325  mg total) by mouth daily with breakfast. 90 tablet 0   omeprazole (PRILOSEC) 20 MG capsule Take 1 capsule (20 mg total) by mouth daily. (Patient taking differently: Take 20 mg by mouth daily before breakfast.) 90 capsule 1   ondansetron (ZOFRAN) 4 MG tablet Take 1 tablet (4 mg total) by mouth every 8 (eight) hours as needed for nausea or vomiting. 12 tablet 0   mupirocin ointment (BACTROBAN) 2 % Apply 1 Application topically 2 (two) times daily. (Patient not taking: Reported on 10/28/2023) 22 g 2   nystatin (MYCOSTATIN) 100000 UNIT/ML suspension Take 5 mLs (500,000 Units total) by mouth 4 (four) times daily. X 7 days. Swish around mouth and retain for as long as possible before swallowing. (Patient not taking: Reported on 10/28/2023) 473 mL 0   triamcinolone (KENALOG) 0.025 % cream Apply 1 Application topically 2 (two) times daily. (Patient not taking: Reported on 10/28/2023) 15 g 0   triamcinolone ointment (KENALOG) 0.5 % Apply 1 Application topically 2 (two) times daily. Use sparingly for < 1 week (Patient not taking: Reported on 11/04/2023) 15 g 2   No current facility-administered medications on file prior to visit.    Review of Systems  Constitutional:  Positive for chills. Negative for fever.  Respiratory:  Negative for cough.   Cardiovascular:  Negative for chest pain and palpitations.  Gastrointestinal:  Negative for nausea and vomiting.  Musculoskeletal:  Positive for arthralgias and joint swelling.      Objective:    BP (!) 130/98   Pulse (!) 116   Temp 99.9 F (37.7 C) (Oral)   Ht 5\' 3"  (1.6 m)   Wt 162 lb (73.5 kg)   LMP  (LMP Unknown)   SpO2 96%   BMI 28.70 kg/m  BP Readings from Last 3 Encounters:  11/04/23 (!) 130/98  10/30/23 133/87  10/28/23 (!) 159/106   Wt Readings from Last 3 Encounters:  11/04/23 162 lb (73.5 kg)  10/28/23 165 lb 11.2 oz (75.2 kg)  09/23/23 166 lb 3.2 oz (75.4 kg)    Physical Exam Vitals reviewed.  Constitutional:      Appearance: She is  well-developed.  Eyes:     Conjunctiva/sclera: Conjunctivae normal.  Neck:     Comments: Severely limited range of motion of neck.  Cardiovascular:     Rate and Rhythm: Normal rate and regular rhythm.     Pulses: Normal pulses.     Heart sounds: Normal heart sounds.  Pulmonary:     Effort: Pulmonary effort is normal.     Breath sounds: Normal breath sounds. No wheezing, rhonchi or rales.  Musculoskeletal:     Left elbow: Swelling and effusion present. Decreased range of motion. Tenderness present.     Cervical back: Spasms, torticollis and tenderness present. Pain with movement and spinous process tenderness present.       Back:     Right ankle: Swelling present. Decreased range of motion.     Comments: Left olecranon edema, increased heat and exquisite tenderness.   Right lateral malleolus edema, increased warmth.   Skin:  General: Skin is warm and dry.  Neurological:     Mental Status: She is alert.  Psychiatric:        Speech: Speech normal.        Behavior: Behavior normal.        Thought Content: Thought content normal.

## 2023-11-04 NOTE — ED Provider Triage Note (Signed)
 Emergency Medicine Provider Triage Evaluation Note  Judith Drake , a 53 y.o. female  was evaluated in triage.  Pt complains of pain everywhere. PCP sent for concern for septic joint.  Review of Systems  Positive: Body aches, fever, chills, fatigue, nausea Negative: Cough, congestion, V/D  Physical Exam  BP 129/83 (BP Location: Right Arm)   Pulse (!) 116   Temp 100 F (37.8 C) (Oral)   Resp 18   LMP  (LMP Unknown)   SpO2 100%  Gen:   Awake, no distress   Resp:  Normal effort  MSK:   Moves extremities without difficulty  Other:  Petechial rash on bilateral feet/ankles, L ankle swollen  Medical Decision Making  Medically screening exam initiated at 6:13 PM.  Appropriate orders placed.  Judith Drake was informed that the remainder of the evaluation will be completed by another provider, this initial triage assessment does not replace that evaluation, and the importance of remaining in the ED until their evaluation is complete.  Labs and imaging ordered   Judith Drake 11/04/23 1610

## 2023-11-04 NOTE — Progress Notes (Signed)
 Pharmacy Antibiotic Note  Judith Drake is a 53 y.o. female admitted on 11/04/2023 with sepsis.  Pharmacy has been consulted for Vancomycin dosing.  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   Presents with acute joint swelling, elevated WBC and low grade fever.  ID consulted.  Scr <0.8 at baseline.  Rounded to 0.8 for purposes of antibiotic dose calculations.   Plan: Rocephin per MD Vancomycin 1500mg  IV q24h to target AUC 400-550.  Estimated AUC on this regimen is 461. Monitor renal function and cx data  F/U ID recommendations     Temp (24hrs), Avg:99.8 F (37.7 C), Min:99.5 F (37.5 C), Max:100 F (37.8 C)  Recent Labs  Lab 10/29/23 0014 10/29/23 0604 10/30/23 0711 11/04/23 1827  WBC  --  14.8* 11.9* 18.2*  CREATININE  --  0.30* 0.45 0.64  LATICACIDVEN 1.1  --   --  1.1    Estimated Creatinine Clearance: 79.9 mL/min (by C-G formula based on SCr of 0.64 mg/dL).    Allergies  Allergen Reactions   Amoxicillin Nausea And Vomiting    Has patient had a PCN reaction causing immediate rash, facial/tongue/throat swelling, SOB or lightheadedness with hypotension: No Has patient had a PCN reaction causing severe rash involving mucus membranes or skin necrosis: No Has patient had a PCN reaction that required hospitalization: No Has patient had a PCN reaction occurring within the last 10 years: No If all of the above answers are "NO", then may proceed with Cephalosporin use.    Dilaudid [Hydromorphone Hcl] Itching   Morphine And Codeine Other (See Comments)    Hallucinations     Antimicrobials this admission: 2/28 Rocephin >>  2/28 Vancomycin >>   Dose adjustments this admission:  Microbiology results: 2/27 BCx:  2/27 Resp PCR: negative  Previous cx: 2/21 Grp A PCR: POSITIVE  Thank you for allowing pharmacy to be a part of this patient's care.  Voncille Simm PharmD 11/04/2023 11:03 PM

## 2023-11-04 NOTE — ED Provider Notes (Signed)
 [place] EMERGENCY DEPARTMENT AT [facility] Provider Note   CSN: [id number] Arrival date & time: 11/04/23  1721     History {Add pertinent medical, surgical, social history, OB history to HPI:1} Chief Complaint  Patient presents with   Ankle Pain    NOEL HENANDEZ is a 53 y.o. female.  HPI     53 year old female with a history of hypertension, GERD, chronic anxiety/depression, recently diagnosed syphilis for which she completed 28 days of doxycycline, recent admission February 20 to 22 for sepsis secondary to strep pharyngitis     During her admission she had a lumbar puncture She was discharged on Duricef Taking her antibiotic as prescribed, has 2 days left Has not felt well since discharge, generalized weakness, fatigue, nausea, night sweats  Tuesday evening started hurting, kind of all over but a lot in ankle left, had surgery over 1.5 years ago, and left elbow with screws and pins a long time ago, right shoulder pain   Can't lift legs, painful History of inflammation in the past, never like this Had it in bottoms of feet, had prednisone and took care of it in the past Tuesday evening started having the pain in the joints and body aches, as days going on feeling worse, pain moving neck, legs, arms every muscle feels like it is in a knot, very painful. Not sure inflammation or infection.     Has had fevers but not really high. 99.9 in office. Over weekend was 102 when admitted.   Rash began about 2 weeks ago on legs but looking more prominent. Thought maybe connected to syphilis diagnosis  Past Medical History:  Diagnosis Date   Abnormal uterine bleeding (AUB) 03/20/2019   Acute lower UTI 05/12/2019   Acute respiratory failure with hypoxia (HCC) 04/10/2018   Anemia    Arthritis    lower back and hips   Dysmenorrhea 03/20/2019   Elevated liver enzymes 11/24/2013   ETOH abuse    Fatty liver 10/03/2019   Fibroid    GERD (gastroesophageal reflux  disease)    Heavy menstrual period    Hypertension    Menorrhagia 12/13/2017   SIRS (systemic inflammatory response syndrome) (HCC) 05/12/2019     Home Medications Prior to Admission medications   Medication Sig Start Date End Date Taking? Authorizing Provider  amLODipine (NORVASC) 10 MG tablet Take 1 tablet (10 mg total) by mouth daily. 08/10/23   Bethanie Dicker, NP  ascorbic acid (VITAMIN C) 500 MG tablet Take 500-1,000 mg by mouth daily.    [provider]  Aspirin-Salicylamide-Caffeine (BC HEADACHE POWDER PO) Take 1 packet by mouth 2 (two) times daily as needed (for pain, headaches, or discomfort).    [provider]  cefadroxil (DURICEF) 500 MG capsule Take 2 capsules (1,000 mg total) by mouth daily for 8 days. 10/30/23 11/07/23  Zigmund Daniel., MD  cetirizine (ZYRTEC) 10 MG tablet Take 1 tablet (10 mg total) by mouth daily. Patient taking differently: Take 10 mg by mouth daily as needed for allergies or rhinitis. 07/27/23   Eustace Moore, MD  DULoxetine (CYMBALTA) 30 MG capsule Take 2 capsules (60 mg total) by mouth daily. 09/23/23   Allegra Grana, FNP  ferrous sulfate 325 (65 FE) MG EC tablet Take 1 tablet (325 mg total) by mouth daily with breakfast. 08/17/23   Allegra Grana, FNP  mupirocin ointment (BACTROBAN) 2 % Apply 1 Application topically 2 (two) times daily. Patient not taking: Reported on 10/28/2023 08/17/23  Allegra Grana, FNP  nystatin (MYCOSTATIN) 100000 UNIT/ML suspension Take 5 mLs (500,000 Units total) by mouth 4 (four) times daily. X 7 days. Swish around mouth and retain for as long as possible before swallowing. Patient not taking: Reported on 10/28/2023 08/10/23   Bethanie Dicker, NP  omeprazole (PRILOSEC) 20 MG capsule Take 1 capsule (20 mg total) by mouth daily. Patient taking differently: Take 20 mg by mouth daily before breakfast. 08/10/23   Bethanie Dicker, NP  ondansetron (ZOFRAN) 4 MG tablet Take 1 tablet (4 mg total) by mouth every 8  (eight) hours as needed for nausea or vomiting. 10/30/23   Zigmund Daniel., MD  triamcinolone (KENALOG) 0.025 % cream Apply 1 Application topically 2 (two) times daily. Patient not taking: Reported on 10/28/2023 08/17/23   Allegra Grana, FNP  triamcinolone ointment (KENALOG) 0.5 % Apply 1 Application topically 2 (two) times daily. Use sparingly for < 1 week Patient not taking: Reported on 11/04/2023 09/23/23   Allegra Grana, FNP      Allergies    Amoxicillin, Dilaudid [hydromorphone hcl], and Morphine and codeine    Review of Systems   Review of Systems  Physical Exam Updated Vital Signs BP (!) 148/92 (BP Location: Left Arm)   Pulse (!) 108   Temp 99.5 F (37.5 C) (Oral)   Resp 18   LMP  (LMP Unknown)   SpO2 99%  Physical Exam  ED Results / Procedures / Treatments   Labs (all labs ordered are listed, but only abnormal results are displayed) Labs Reviewed  BASIC METABOLIC PANEL - Abnormal; Notable for the following components:      Result Value   Sodium 133 (*)    Potassium 3.0 (*)    Chloride 92 (*)    Glucose, Bld 121 (*)    Anion gap 16 (*)    All other components within normal limits  CBC WITH DIFFERENTIAL/PLATELET - Abnormal; Notable for the following components:   WBC 18.2 (*)    Hemoglobin 15.7 (*)    HCT 47.1 (*)    Platelets 516 (*)    Neutro Abs 14.9 (*)    Monocytes Absolute 1.1 (*)    Abs Immature Granulocytes 0.18 (*)    All other components within normal limits  RESP PANEL BY RT-PCR (RSV, FLU A&B, COVID)  RVPGX2  LACTIC ACID, PLASMA  CK  LACTIC ACID, PLASMA    EKG None  Radiology DG Ankle Complete Right Result Date: 11/04/2023 CLINICAL DATA:  Swelling and pain in right ankle, surgery 1 year ago EXAM: RIGHT ANKLE - COMPLETE 3+ VIEW COMPARISON:  05/06/2022 FINDINGS: Frontal, oblique, and lateral views of the right ankle. Lateral plate and screw fixation across the distal fibula is noted, spanning a healed distal fibular fracture. There  are no acute or destructive bony abnormalities. Mild osteoarthritis of the tibiotalar joint. Prominent inferior calcaneal spur. There is diffuse soft tissue edema, greatest anteriorly and laterally at the ankle. IMPRESSION: 1. ORIF of a prior healed distal fibular fracture. No evidence of orthopedic hardware failure or loosening. 2. Diffuse soft tissue swelling greatest within the anterior and lateral ankle. 3. Osteoarthritis of the ankle. 4. Prominent inferior calcaneal spur. Electronically Signed   By: Sharlet Salina M.D.   On: 11/04/2023 20:15    Procedures Procedures  {Document cardiac monitor, telemetry assessment procedure when appropriate:1}  Medications Ordered in ED Medications  oxyCODONE-acetaminophen (PERCOCET/ROXICET) 5-325 MG per tablet 1 tablet (1 tablet Oral Given 11/04/23 1829)  ED Course/ Medical Decision Making/ A&P   {   Click here for ABCD2, HEART and other calculatorsREFRESH Note before signing :1}                              Medical Decision Making Amount and/or Complexity of Data Reviewed Labs: ordered. Radiology: ordered.  Risk Prescription drug management.   ***   Differential diagnosis includes bacteremia, rheumatic fever, post streptococcal reactive arthritis, multiple areas of septic arthritis, other rheumatologic disorder, drug reaction, disseminated gonococcus  Discussed with Orthopedics Dr. Christell Constant. At this time given multiple joints involved and full ROM will not perform emergent tap however will see in morning.  I have ordered MRI specifically of right ankle given tenderness over this area.   Dr. Renold Don ID consulted as well, can consider gonococcus, choose joint for IR joint aspiration to send GC/chl.     {Document critical care time when appropriate:1} {Document review of labs and clinical decision tools ie heart score, Chads2Vasc2 etc:1}  {Document your independent review of radiology images, and any outside records:1} {Document your discussion  with family members, caretakers, and with consultants:1} {Document social determinants of health affecting pt's care:1} {Document your decision making why or why not admission, treatments were needed:1} Final Clinical Impression(s) / ED Diagnoses Final diagnoses:  None    Rx / DC Orders ED Discharge Orders     None

## 2023-11-04 NOTE — Patient Instructions (Signed)
 Please go directly to [facility] emergency room.  I am concerned for infection, septic joint.  You are also tachycardic and have a low-grade temperature

## 2023-11-05 ENCOUNTER — Encounter: Payer: Self-pay | Admitting: Family

## 2023-11-05 ENCOUNTER — Emergency Department (HOSPITAL_COMMUNITY): Payer: 59

## 2023-11-05 DIAGNOSIS — M2550 Pain in unspecified joint: Secondary | ICD-10-CM

## 2023-11-05 DIAGNOSIS — F32A Depression, unspecified: Secondary | ICD-10-CM | POA: Diagnosis not present

## 2023-11-05 DIAGNOSIS — Z841 Family history of disorders of kidney and ureter: Secondary | ICD-10-CM | POA: Diagnosis not present

## 2023-11-05 DIAGNOSIS — Z8619 Personal history of other infectious and parasitic diseases: Secondary | ICD-10-CM | POA: Diagnosis not present

## 2023-11-05 DIAGNOSIS — Z818 Family history of other mental and behavioral disorders: Secondary | ICD-10-CM | POA: Diagnosis not present

## 2023-11-05 DIAGNOSIS — M130 Polyarthritis, unspecified: Secondary | ICD-10-CM | POA: Insufficient documentation

## 2023-11-05 DIAGNOSIS — R531 Weakness: Secondary | ICD-10-CM | POA: Diagnosis not present

## 2023-11-05 DIAGNOSIS — M064 Inflammatory polyarthropathy: Secondary | ICD-10-CM | POA: Diagnosis not present

## 2023-11-05 DIAGNOSIS — Z8249 Family history of ischemic heart disease and other diseases of the circulatory system: Secondary | ICD-10-CM | POA: Diagnosis not present

## 2023-11-05 DIAGNOSIS — Z833 Family history of diabetes mellitus: Secondary | ICD-10-CM | POA: Diagnosis not present

## 2023-11-05 DIAGNOSIS — R21 Rash and other nonspecific skin eruption: Secondary | ICD-10-CM

## 2023-11-05 DIAGNOSIS — Z789 Other specified health status: Secondary | ICD-10-CM | POA: Diagnosis not present

## 2023-11-05 DIAGNOSIS — K760 Fatty (change of) liver, not elsewhere classified: Secondary | ICD-10-CM | POA: Diagnosis not present

## 2023-11-05 DIAGNOSIS — F509 Eating disorder, unspecified: Secondary | ICD-10-CM | POA: Diagnosis not present

## 2023-11-05 DIAGNOSIS — Z83438 Family history of other disorder of lipoprotein metabolism and other lipidemia: Secondary | ICD-10-CM | POA: Diagnosis not present

## 2023-11-05 DIAGNOSIS — Z823 Family history of stroke: Secondary | ICD-10-CM | POA: Diagnosis not present

## 2023-11-05 DIAGNOSIS — R509 Fever, unspecified: Secondary | ICD-10-CM | POA: Diagnosis not present

## 2023-11-05 DIAGNOSIS — Z1152 Encounter for screening for COVID-19: Secondary | ICD-10-CM | POA: Diagnosis not present

## 2023-11-05 DIAGNOSIS — A419 Sepsis, unspecified organism: Secondary | ICD-10-CM

## 2023-11-05 DIAGNOSIS — R5081 Fever presenting with conditions classified elsewhere: Secondary | ICD-10-CM | POA: Diagnosis not present

## 2023-11-05 DIAGNOSIS — M7022 Olecranon bursitis, left elbow: Secondary | ICD-10-CM | POA: Diagnosis not present

## 2023-11-05 DIAGNOSIS — Z880 Allergy status to penicillin: Secondary | ICD-10-CM | POA: Diagnosis not present

## 2023-11-05 DIAGNOSIS — E876 Hypokalemia: Secondary | ICD-10-CM | POA: Diagnosis not present

## 2023-11-05 DIAGNOSIS — M25522 Pain in left elbow: Secondary | ICD-10-CM | POA: Diagnosis not present

## 2023-11-05 DIAGNOSIS — M7989 Other specified soft tissue disorders: Secondary | ICD-10-CM | POA: Diagnosis not present

## 2023-11-05 DIAGNOSIS — M19071 Primary osteoarthritis, right ankle and foot: Secondary | ICD-10-CM | POA: Diagnosis not present

## 2023-11-05 DIAGNOSIS — K219 Gastro-esophageal reflux disease without esophagitis: Secondary | ICD-10-CM | POA: Diagnosis not present

## 2023-11-05 DIAGNOSIS — Z800 Family history of malignant neoplasm of digestive organs: Secondary | ICD-10-CM | POA: Diagnosis not present

## 2023-11-05 DIAGNOSIS — Z79899 Other long term (current) drug therapy: Secondary | ICD-10-CM | POA: Diagnosis not present

## 2023-11-05 DIAGNOSIS — F419 Anxiety disorder, unspecified: Secondary | ICD-10-CM | POA: Diagnosis not present

## 2023-11-05 DIAGNOSIS — M25572 Pain in left ankle and joints of left foot: Secondary | ICD-10-CM | POA: Diagnosis not present

## 2023-11-05 DIAGNOSIS — I10 Essential (primary) hypertension: Secondary | ICD-10-CM | POA: Diagnosis not present

## 2023-11-05 DIAGNOSIS — M25571 Pain in right ankle and joints of right foot: Secondary | ICD-10-CM | POA: Diagnosis not present

## 2023-11-05 DIAGNOSIS — Z90710 Acquired absence of both cervix and uterus: Secondary | ICD-10-CM | POA: Diagnosis not present

## 2023-11-05 DIAGNOSIS — F17210 Nicotine dependence, cigarettes, uncomplicated: Secondary | ICD-10-CM | POA: Diagnosis not present

## 2023-11-05 DIAGNOSIS — M19022 Primary osteoarthritis, left elbow: Secondary | ICD-10-CM | POA: Diagnosis not present

## 2023-11-05 DIAGNOSIS — M25422 Effusion, left elbow: Secondary | ICD-10-CM | POA: Diagnosis not present

## 2023-11-05 LAB — CBC
HCT: 37.7 % (ref 36.0–46.0)
Hemoglobin: 13 g/dL (ref 12.0–15.0)
MCH: 32.6 pg (ref 26.0–34.0)
MCHC: 34.5 g/dL (ref 30.0–36.0)
MCV: 94.5 fL (ref 80.0–100.0)
Platelets: 435 10*3/uL — ABNORMAL HIGH (ref 150–400)
RBC: 3.99 MIL/uL (ref 3.87–5.11)
RDW: 15.1 % (ref 11.5–15.5)
WBC: 17.1 10*3/uL — ABNORMAL HIGH (ref 4.0–10.5)
nRBC: 0 % (ref 0.0–0.2)

## 2023-11-05 LAB — COMPREHENSIVE METABOLIC PANEL
ALT: 41 U/L (ref 0–44)
AST: 45 U/L — ABNORMAL HIGH (ref 15–41)
Albumin: 3.6 g/dL (ref 3.5–5.0)
Alkaline Phosphatase: 73 U/L (ref 38–126)
Anion gap: 12 (ref 5–15)
BUN: 8 mg/dL (ref 6–20)
CO2: 25 mmol/L (ref 22–32)
Calcium: 9 mg/dL (ref 8.9–10.3)
Chloride: 94 mmol/L — ABNORMAL LOW (ref 98–111)
Creatinine, Ser: 0.47 mg/dL (ref 0.44–1.00)
GFR, Estimated: >60 mL/min (ref >60)
Glucose, Bld: 107 mg/dL — ABNORMAL HIGH (ref 70–99)
Potassium: 3.3 mmol/L — ABNORMAL LOW (ref 3.5–5.1)
Sodium: 131 mmol/L — ABNORMAL LOW (ref 135–145)
Total Bilirubin: 1.2 mg/dL (ref 0.0–1.2)
Total Protein: 7.7 g/dL (ref 6.5–8.1)

## 2023-11-05 LAB — HIV ANTIBODY (ROUTINE TESTING W REFLEX): HIV Screen 4th Generation wRfx: NONREACTIVE

## 2023-11-05 LAB — C-REACTIVE PROTEIN: CRP: 15.2 mg/dL — ABNORMAL HIGH (ref <1.0)

## 2023-11-05 MED ORDER — ONDANSETRON HCL 4 MG PO TABS: 4.0000 mg | ORAL_TABLET | Freq: Four times a day (QID) | ORAL | Status: DC | PRN

## 2023-11-05 MED ORDER — HYDROMORPHONE HCL 1 MG/ML IJ SOLN
0.5000 mg | INTRAMUSCULAR | Status: DC | PRN
  Administered 2023-11-05 – 2023-11-07 (×9): 0.5 mg via INTRAVENOUS
  Filled 2023-11-05 (×9): qty 0.5

## 2023-11-05 MED ORDER — SODIUM CHLORIDE 0.9 % IV SOLN: INTRAVENOUS | Status: DC

## 2023-11-05 MED ORDER — ACETAMINOPHEN 650 MG RE SUPP: 650.0000 mg | Freq: Four times a day (QID) | RECTAL | Status: DC | PRN

## 2023-11-05 MED ORDER — ONDANSETRON HCL 4 MG/2ML IJ SOLN: 4.0000 mg | Freq: Four times a day (QID) | INTRAMUSCULAR | Status: DC | PRN

## 2023-11-05 MED ORDER — BISACODYL 5 MG PO TBEC
5.0000 mg | DELAYED_RELEASE_TABLET | Freq: Every day | ORAL | Status: DC | PRN
  Administered 2023-11-09 – 2023-11-10 (×2): 5 mg via ORAL
  Filled 2023-11-05 (×2): qty 1

## 2023-11-05 MED ORDER — SENNOSIDES-DOCUSATE SODIUM 8.6-50 MG PO TABS: 1.0000 | ORAL_TABLET | Freq: Every evening | ORAL | Status: DC | PRN

## 2023-11-05 MED ORDER — LACTATED RINGERS IV SOLN: INTRAVENOUS | Status: DC

## 2023-11-05 MED ORDER — POTASSIUM CHLORIDE CRYS ER 20 MEQ PO TBCR
40.0000 meq | EXTENDED_RELEASE_TABLET | Freq: Once | ORAL | Status: AC
  Administered 2023-11-05: 40 meq via ORAL
  Filled 2023-11-05: qty 2

## 2023-11-05 MED ORDER — POTASSIUM CHLORIDE 20 MEQ PO PACK
60.0000 meq | PACK | Freq: Once | ORAL | Status: AC
Start: 2023-11-05 — End: 2023-11-05
  Administered 2023-11-05: 60 meq via ORAL
  Filled 2023-11-05: qty 3

## 2023-11-05 MED ORDER — FENTANYL CITRATE PF 50 MCG/ML IJ SOSY
100.0000 ug | PREFILLED_SYRINGE | Freq: Once | INTRAMUSCULAR | Status: AC
  Administered 2023-11-05: 100 ug via INTRAVENOUS
  Filled 2023-11-05: qty 2

## 2023-11-05 MED ORDER — ALPRAZOLAM 0.5 MG PO TABS
0.5000 mg | ORAL_TABLET | Freq: Three times a day (TID) | ORAL | Status: DC | PRN
  Administered 2023-11-07 – 2023-11-09 (×3): 0.5 mg via ORAL
  Filled 2023-11-05 (×3): qty 1

## 2023-11-05 MED ORDER — ACETAMINOPHEN 325 MG PO TABS
650.0000 mg | ORAL_TABLET | Freq: Four times a day (QID) | ORAL | Status: DC | PRN
  Administered 2023-11-06 – 2023-11-08 (×2): 650 mg via ORAL
  Filled 2023-11-05 (×2): qty 2

## 2023-11-05 MED ORDER — HYDROCODONE-ACETAMINOPHEN 5-325 MG PO TABS
1.0000 | ORAL_TABLET | ORAL | Status: DC | PRN
  Administered 2023-11-05 – 2023-11-10 (×19): 2 via ORAL
  Filled 2023-11-05 (×19): qty 2

## 2023-11-05 MED ORDER — POTASSIUM CHLORIDE CRYS ER 20 MEQ PO TBCR
40.0000 meq | EXTENDED_RELEASE_TABLET | Freq: Once | ORAL | Status: DC
  Filled 2023-11-05: qty 2

## 2023-11-05 MED ORDER — FENTANYL CITRATE PF 50 MCG/ML IJ SOSY
50.0000 ug | PREFILLED_SYRINGE | INTRAMUSCULAR | Status: AC | PRN
  Administered 2023-11-05 (×4): 50 ug via INTRAVENOUS
  Filled 2023-11-05 (×4): qty 1

## 2023-11-05 MED ORDER — GADOBUTROL 1 MMOL/ML IV SOLN
7.0000 mL | Freq: Once | INTRAVENOUS | Status: AC | PRN
  Administered 2023-11-05: 7 mL via INTRAVENOUS

## 2023-11-05 MED ORDER — PANTOPRAZOLE SODIUM 40 MG PO TBEC
40.0000 mg | DELAYED_RELEASE_TABLET | Freq: Every day | ORAL | Status: DC
  Administered 2023-11-05 – 2023-11-10 (×6): 40 mg via ORAL
  Filled 2023-11-05 (×6): qty 1

## 2023-11-05 MED ORDER — ENOXAPARIN SODIUM 40 MG/0.4ML IJ SOSY
40.0000 mg | PREFILLED_SYRINGE | INTRAMUSCULAR | Status: DC
  Administered 2023-11-05 – 2023-11-09 (×5): 40 mg via SUBCUTANEOUS
  Filled 2023-11-05 (×5): qty 0.4

## 2023-11-05 MED ORDER — AMLODIPINE BESYLATE 10 MG PO TABS
10.0000 mg | ORAL_TABLET | Freq: Every day | ORAL | Status: DC
  Administered 2023-11-05 – 2023-11-10 (×6): 10 mg via ORAL
  Filled 2023-11-05: qty 2
  Filled 2023-11-05 (×5): qty 1

## 2023-11-05 MED ORDER — DIPHENHYDRAMINE HCL 25 MG PO CAPS
25.0000 mg | ORAL_CAPSULE | Freq: Four times a day (QID) | ORAL | Status: AC | PRN
  Administered 2023-11-05 – 2023-11-06 (×2): 25 mg via ORAL
  Filled 2023-11-05 (×2): qty 1

## 2023-11-05 MED ORDER — DULOXETINE HCL 60 MG PO CPEP
60.0000 mg | ORAL_CAPSULE | Freq: Every day | ORAL | Status: DC
  Administered 2023-11-05 – 2023-11-10 (×6): 60 mg via ORAL
  Filled 2023-11-05 (×3): qty 1
  Filled 2023-11-05: qty 2
  Filled 2023-11-05 (×2): qty 1

## 2023-11-05 MED ORDER — SODIUM CHLORIDE 0.9 % IV SOLN
2.0000 g | INTRAVENOUS | Status: DC
  Administered 2023-11-06 – 2023-11-08 (×4): 2 g via INTRAVENOUS
  Filled 2023-11-05 (×4): qty 20

## 2023-11-05 MED ORDER — SODIUM CHLORIDE 0.9% FLUSH
3.0000 mL | Freq: Two times a day (BID) | INTRAVENOUS | Status: DC
  Administered 2023-11-05 – 2023-11-10 (×8): 3 mL via INTRAVENOUS

## 2023-11-05 NOTE — Hospital Course (Signed)
 Judith Drake is a 53 y.o. female with medical history significant for HTN, depression/anxiety, alcohol use, s/p ORIF right ankle (03/2022), recent diagnosis of syphilis s/p 28 days of doxycycline, and recent admission for sepsis due to strep pharyngitis (treated with ceftriaxone>> Duricef) who is admitted with polyarthropathy meeting sepsis criteria.

## 2023-11-05 NOTE — ED Notes (Signed)
 Pt taken via W/C to [facility]. Tolerated well.

## 2023-11-05 NOTE — ED Notes (Signed)
 Lab called to add on GC/C to urine previously sent.

## 2023-11-05 NOTE — Consult Note (Signed)
 Orthopedic Surgery Consult Note  Assessment: Patient is a 53 y.o. female with recent onset of multiple painful joints   Plan: -Patient has had recent onset of multiple areas with pain and has noticed a petechial rash.  It would be very unlikely to develop multiple simultaneous joints with septic arthritis.  She also did not have significant pain with passive range of motion of any of the painful joints.  This seems more likely rheumatologic in nature.  No operative intervention recommended at this time.  Weightbearing as tolerated.  Okay for diet from orthopedic perspective.  Will continue to follow along.  ___________________________________________________________________________   Reason for consult: Right ankle pain with past surgery  History:  Patient is a 53 y.o. female with history of right ankle fracture ORIF performed by Dr. August Saucer in July 2023 who comes into the emergency department with multiple areas of pain.  She has recently been treated for syphilis and strep A and was hospitalized for this with a discharge date of 10/30/2023.  She states that starting on 11/02/2023, she noted onset of neck pain, right shoulder pain, left elbow pain, right ankle pain, bilateral knee pain, bilateral heel pain.  She said that the heel pain is so significant that she was unable to weight-bear.  She said she has been laying down since 11/02/2023.  She saw her primary care doctor who recommended that she come to the ER.  She has noticed a rash over her skin.  She has seen swelling around her right ankle and left elbow.  She is having a lot of pain in all of the areas previously noted above.   Past medical history:  Dysmenorrhea EtOH abuse Hepatic steatosis GERD HTN  Allergies: Amoxicillin, Dilaudid, morphine  Past surgical history:  Right ankle fracture ORIF Left elbow fracture ORIF  Social history: Reports use of nicotine-containing products (cigarettes, vaping, smokeless, etc.) Alcohol use:  Yes, has 6-8 drinks per day Denies use of recreational drugs   Physical Exam:  BMI of 28.7  General: no acute distress, appears stated age Neurologic: alert, answering questions appropriately, following commands Cardiovascular: regular rate, no cyanosis Respiratory: unlabored breathing on room air, symmetric chest rise Psychiatric: appropriate affect, normal cadence to speech  MSK:   -Right upper extremity  TTP around the shoulder, otherwise nontender palpation over the remainder of the extremity, no gross deformity, no open wounds  Able to passively internally and externally rotate her arm at side with no increased pain, could abduct her arm to 90 degrees before pain Fires deltoid, biceps, triceps, wrist extensors, wrist flexors, finger extensors, finger flexors  AIN/PIN/IO intact  Palpable radial pulse  Sensation intact to light touch in median/ulnar/radial/axillary nerve distributions  Hand warm and well perfused  -Left upper extremities  Swelling noted around the elbow with tenderness to palpation, no other tenderness palpation of the remainder of the extremity, no gross deformity, no open wounds  Able to passively range elbow from 5 degrees to 90 degrees without any increased pain Fires deltoid, biceps, triceps, wrist extensors, wrist flexors, finger extensors, finger flexors  AIN/PIN/IO intact  Palpable radial pulse  Sensation intact to light touch in median/ulnar/radial/axillary nerve distributions  Hand warm and well perfused  -Left lower extremity  No swelling or erythema seen, no open wounds, no gross deformity, no pain with logroll  No palpable effusion at the knee Fires hip flexors, quadriceps, hamstrings, tibialis anterior, gastrocnemius and soleus, extensor hallucis longus Plantarflexes and dorsiflexes toes Sensation intact to light touch in sural, saphenous,  tibial, deep peroneal, and superficial peroneal nerve distributions Foot warm and well perfused  -Right  lower extremity  Slight erythema seen around the lateral aspect of the ankle near the incision, no drainage, incision well-approximated, no open wounds seen, no sinus tract, no gross deformity, no pain with logroll  Able to passively range ankle from neutral to 30 degrees without any increased pain, patient able to range ankle from 0-25 with some pain Fires hip flexors, quadriceps, hamstrings, tibialis anterior, gastrocnemius and soleus, extensor hallucis longus Plantarflexes and dorsiflexes toes Sensation intact to light touch in sural, saphenous, tibial, deep peroneal, and superficial peroneal nerve distributions Foot warm and well perfused  Imaging: XR of the left elbow from 11/04/2023 were independently reviewed and interpreted, showing 2 partially-threaded screws within the distal humerus with washers at the end of the screws.  No lucency seen around the screws.  Arthritis seen within the ulnohumeral and radiohumeral joints. No fracture or dislocation.   XRs of the right ankle from 11/04/2023 were independently reviewed and interpreted, showing lateral malleolus fixation with a plate.  There is also a lag screw present.  There is no lucency seen around the screws.  No the screws backed out.  Fracture appears healed. Anatomic alignment.  No significant degenerative changes within the ankle joint.   Patient name: Judith Drake Patient MRN: [id number] Date: 11/05/23

## 2023-11-05 NOTE — Progress Notes (Signed)
 PROGRESS NOTE  Judith Drake  ZOX:[id number] DOB: 03/29/1971 DOA: 11/04/2023 PCP: Allegra Grana, FNP   Brief Narrative: Patient is 53 year old female with history of hypertension, depression/anxiety, alcohol use, recent diagnosis of syphilis and completed  4 weeks of doxycycline, recent admission for sepsis due to streptococcal pharyngitis s/p treatment with ceftriaxone who presented to the emergency department with complaint of polyarthropathy, fever, chills.  Developed pain and swelling in multiple joints  about 3 days ago.Also developed petechial rash involving her extremities.  On presentation, she was hemodynamically stable.  Labs showed leukocytosis of 18.2, potassium of 3, elevated ESR/CRP.  ID consulted, plan for testing GC/chlamydia.  Started on empiric vancomycin, ceftriaxone.  Culture sent  Assessment & Plan:  Principal Problem:   Sepsis (HCC) Active Problems:   Anxiety and depression   Polyarthropathy  Symmetrical polyarthropathy/sepsis: Presented with leukocytosis, tachycardia, fever, polyarthropathy. She was  mildly febrile in the emergency department.  Recent treatment for syphilis and group A streptococcus pharyngitis.  ID, orthopedics consulted.  Started on Rocephin vanco.  Follow-up cultures.  Plan to obtain GC/chlamydia, HIV, viral panel.  Continue gentle IV.  History of right ankle fracture: X-ray showed ORIF of prior healed distal fibula fracture. No evidence of orthopedic hardware failure or loosening. Diffuse soft tissue swelling greatest within the anterior and lateral ankle.  Orthopedics also consulted.  No plan for any intervention  History of syphilis: Recently completed 28 days treatment with doxycycline.  Hypokalemia: Currently being monitored and supplemented as needed  Hypertension: Continue amlodipine  Depression/anxiety: Continue Cymbalta       DVT prophylaxis:SCDs Start: 11/05/23 0123     Code Status: Full Code  Family Communication: None  at the bedside  Patient status: Inpatient  Patient is from : Home  Anticipated discharge to: Home  Estimated DC date: 2 to 3 days   Consultants: ID and orthopedics  Procedures: None  Antimicrobials:  Anti-infectives (From admission, onward)    Start     Dose/Rate Route Frequency Ordered Stop   11/05/23 2300  cefTRIAXone (ROCEPHIN) 2 g in sodium chloride 0.9 % 100 mL IVPB        2 g 200 mL/hr over 30 Minutes Intravenous Every 24 hours 11/05/23 0125     11/05/23 2200  vancomycin (VANCOREADY) IVPB 1500 mg/300 mL        1,500 mg 150 mL/hr over 120 Minutes Intravenous Every 24 hours 11/04/23 2352     11/04/23 2315  vancomycin (VANCOREADY) IVPB 1500 mg/300 mL        1,500 mg 150 mL/hr over 120 Minutes Intravenous  Once 11/04/23 2302 11/05/23 0522   11/04/23 2300  cefTRIAXone (ROCEPHIN) 2 g in sodium chloride 0.9 % 100 mL IVPB        2 g 200 mL/hr over 30 Minutes Intravenous  Once 11/04/23 2247 11/05/23 0100       Subjective: Patient seen and examined at bedside today.  Hemodynamically stable.  She complains of pain everywhere including the neck.  Afebrile during my evaluation.  Rash is improving.  Objective: Vitals:   11/04/23 2347 11/05/23 0243 11/05/23 0442 11/05/23 0653  BP: (!) 147/94  (!) 155/81 (!) 157/85  Pulse: (!) 110  (!) 108 (!) 108  Resp: 16  19 19   Temp: (!) 100.4 F (38 C) (!) 100.4 F (38 C)  100 F (37.8 C)  TempSrc: Oral Oral  Oral  SpO2: 94%  90% 92%   No intake or output data in the 24 hours ending 11/05/23  0755 There were no vitals filed for this visit.  Examination:  General exam: Overall comfortable, not in distress HEENT: PERRL Respiratory system:  no wheezes or crackles  Cardiovascular system: S1 & S2 heard, RRR.  Gastrointestinal system: Abdomen is nondistended, soft and nontender. Central nervous system: Alert and oriented Extremities: No edema, no clubbing ,no cyanosis Skin: Scattered but fading petechial rash   Data Reviewed: I  have personally reviewed following labs and imaging studies  CBC: Recent Labs  Lab 10/30/23 0711 11/04/23 1827  WBC 11.9* 18.2*  NEUTROABS  --  14.9*  HGB 15.1* 15.7*  HCT 46.6* 47.1*  MCV 95.9 94.6  PLT 233 516*   Basic Metabolic Panel: Recent Labs  Lab 10/30/23 0711 11/04/23 1827  NA 137 133*  K 4.0 3.0*  CL 94* 92*  CO2 31 25  GLUCOSE 106* 121*  BUN 7 9  CREATININE 0.45 0.64  CALCIUM 9.5 9.8  MG 2.1  --   PHOS 4.1  --      Recent Results (from the past 240 hours)  Resp panel by RT-PCR (RSV, Flu A&B, Covid) Anterior Nasal Swab     Status: None   Collection Time: 10/28/23  5:51 PM   Specimen: Anterior Nasal Swab  Result Value Ref Range Status   SARS Coronavirus 2 by RT PCR NEGATIVE NEGATIVE Final    Comment: (NOTE) SARS-CoV-2 target nucleic acids are NOT DETECTED.  The SARS-CoV-2 RNA is generally detectable in upper respiratory specimens during the acute phase of infection. The lowest concentration of SARS-CoV-2 viral copies this assay can detect is 138 copies/mL. A negative result does not preclude SARS-Cov-2 infection and should not be used as the sole basis for treatment or other patient management decisions. A negative result may occur with  improper specimen collection/handling, submission of specimen other than nasopharyngeal swab, presence of viral mutation(s) within the areas targeted by this assay, and inadequate number of viral copies(<138 copies/mL). A negative result must be combined with clinical observations, patient history, and epidemiological information. The expected result is Negative.  Fact Sheet for Patients:  BloggerCourse.com  Fact Sheet for Healthcare Providers:  SeriousBroker.it  This test is no t yet approved or cleared by the [place] FDA and  has been authorized for detection and/or diagnosis of SARS-CoV-2 by FDA under an Emergency Use Authorization (EUA). This EUA will  remain  in effect (meaning this test can be used) for the duration of the COVID-19 declaration under Section 564(b)(1) of the Act, 21 U.S.C.section 360bbb-3(b)(1), unless the authorization is terminated  or revoked sooner.       Influenza A by PCR NEGATIVE NEGATIVE Final   Influenza B by PCR NEGATIVE NEGATIVE Final    Comment: (NOTE) The Xpert Xpress SARS-CoV-2/FLU/RSV plus assay is intended as an aid in the diagnosis of influenza from Nasopharyngeal swab specimens and should not be used as a sole basis for treatment. Nasal washings and aspirates are unacceptable for Xpert Xpress SARS-CoV-2/FLU/RSV testing.  Fact Sheet for Patients: BloggerCourse.com  Fact Sheet for Healthcare Providers: SeriousBroker.it  This test is not yet approved or cleared by the [place] FDA and has been authorized for detection and/or diagnosis of SARS-CoV-2 by FDA under an Emergency Use Authorization (EUA). This EUA will remain in effect (meaning this test can be used) for the duration of the COVID-19 declaration under Section 564(b)(1) of the Act, 21 U.S.C. section 360bbb-3(b)(1), unless the authorization is terminated or revoked.     Resp Syncytial Virus by PCR NEGATIVE  NEGATIVE Final    Comment: (NOTE) Fact Sheet for Patients: BloggerCourse.com  Fact Sheet for Healthcare Providers: SeriousBroker.it  This test is not yet approved or cleared by the [place] FDA and has been authorized for detection and/or diagnosis of SARS-CoV-2 by FDA under an Emergency Use Authorization (EUA). This EUA will remain in effect (meaning this test can be used) for the duration of the COVID-19 declaration under Section 564(b)(1) of the Act, 21 U.S.C. section 360bbb-3(b)(1), unless the authorization is terminated or revoked.  Performed at [facility], [address] [place], [place]  [address]   Culture, blood (Routine X 2) w Reflex to ID Panel     Status: None   Collection Time: 10/28/23 10:28 PM   Specimen: BLOOD RIGHT WRIST  Result Value Ref Range Status   Specimen Description   Final    BLOOD RIGHT WRIST Performed at [facility], [address], [place], [place] [address]    Special Requests   Final    BOTTLES DRAWN AEROBIC AND ANAEROBIC Blood Culture adequate volume Performed at [facility], [address] [place], [place] [address]    Culture   Final    NO GROWTH 5 DAYS Performed at [facility], [address], [place], [place] [address]    Report Status 11/03/2023 FINAL  Final  Culture, blood (Routine X 2) w Reflex to ID Panel     Status: None   Collection Time: 10/28/23 10:45 PM   Specimen: BLOOD  Result Value Ref Range Status   Specimen Description   Final    BLOOD BLOOD LEFT FOREARM Performed at [facility], [address] [place], [place] [address]    Special Requests   Final    BOTTLES DRAWN AEROBIC AND ANAEROBIC Blood Culture adequate volume Performed at [facility], [address] [place], [place] [address]    Culture   Final    NO GROWTH 5 DAYS Performed at [facility], [address] [place], [place] [address]    Report Status 11/03/2023 FINAL  Final  CSF culture w Gram Stain     Status: None   Collection Time: 10/29/23  9:31 AM   Specimen: Lumbar Puncture; Cerebrospinal Fluid  Result Value Ref Range Status   Specimen Description   Final    CSF Performed at [facility], [address] [place], [place] [address]    Special Requests   Final    NONE Performed at [facility], [address] [place], [place] [address]    Gram Stain   Final    NO WBC SEEN NO ORGANISMS SEEN Performed at [facility], [address], [place], [place] [address]    Culture   Final    NO GROWTH 3 DAYS Performed at  [facility], [address] [place], [place] [address]    Report Status 11/02/2023 FINAL  Final  Respiratory (~20 pathogens) panel by PCR     Status: None   Collection Time: 10/29/23  1:11 PM   Specimen: Nasopharyngeal Swab; Respiratory  Result Value Ref Range Status   Adenovirus NOT DETECTED NOT DETECTED Final   Coronavirus 229E NOT DETECTED NOT DETECTED Final    Comment: (NOTE) The Coronavirus on the Respiratory Panel, DOES NOT test for the novel  Coronavirus (2019 nCoV)    Coronavirus HKU1 NOT DETECTED NOT DETECTED Final   Coronavirus NL63 NOT DETECTED  NOT DETECTED Final   Coronavirus OC43 NOT DETECTED NOT DETECTED Final   Metapneumovirus NOT DETECTED NOT DETECTED Final   Rhinovirus / Enterovirus NOT DETECTED NOT DETECTED Final   Influenza A NOT DETECTED NOT DETECTED Final   Influenza B NOT DETECTED NOT DETECTED Final   Parainfluenza Virus 1 NOT DETECTED NOT DETECTED Final   Parainfluenza Virus 2 NOT DETECTED NOT DETECTED Final   Parainfluenza Virus 3 NOT DETECTED NOT DETECTED Final   Parainfluenza Virus 4 NOT DETECTED NOT DETECTED Final   Respiratory Syncytial Virus NOT DETECTED NOT DETECTED Final   Bordetella pertussis NOT DETECTED NOT DETECTED Final   Bordetella Parapertussis NOT DETECTED NOT DETECTED Final   Chlamydophila pneumoniae NOT DETECTED NOT DETECTED Final   Mycoplasma pneumoniae NOT DETECTED NOT DETECTED Final    Comment: Performed at [facility], [address] [place], [place] [address]  Group A Strep by PCR     Status: Abnormal   Collection Time: 10/29/23  2:53 PM   Specimen: Throat; Sterile Swab  Result Value Ref Range Status   Group A Strep by PCR DETECTED (A) NOT DETECTED Final    Comment: Performed at [facility], [address] [place], [place] [address]  Resp panel by RT-PCR (RSV, Flu A&B, Covid) Anterior Nasal Swab     Status: None   Collection Time: 11/04/23  6:27 PM   Specimen: Anterior Nasal Swab  Result Value  Ref Range Status   SARS Coronavirus 2 by RT PCR NEGATIVE NEGATIVE Final    Comment: (NOTE) SARS-CoV-2 target nucleic acids are NOT DETECTED.  The SARS-CoV-2 RNA is generally detectable in upper respiratory specimens during the acute phase of infection. The lowest concentration of SARS-CoV-2 viral copies this assay can detect is 138 copies/mL. A negative result does not preclude SARS-Cov-2 infection and should not be used as the sole basis for treatment or other patient management decisions. A negative result may occur with  improper specimen collection/handling, submission of specimen other than nasopharyngeal swab, presence of viral mutation(s) within the areas targeted by this assay, and inadequate number of viral copies(<138 copies/mL). A negative result must be combined with clinical observations, patient history, and epidemiological information. The expected result is Negative.  Fact Sheet for Patients:  BloggerCourse.com  Fact Sheet for Healthcare Providers:  SeriousBroker.it  This test is no t yet approved or cleared by the [place] FDA and  has been authorized for detection and/or diagnosis of SARS-CoV-2 by FDA under an Emergency Use Authorization (EUA). This EUA will remain  in effect (meaning this test can be used) for the duration of the COVID-19 declaration under Section 564(b)(1) of the Act, 21 U.S.C.section 360bbb-3(b)(1), unless the authorization is terminated  or revoked sooner.       Influenza A by PCR NEGATIVE NEGATIVE Final   Influenza B by PCR NEGATIVE NEGATIVE Final    Comment: (NOTE) The Xpert Xpress SARS-CoV-2/FLU/RSV plus assay is intended as an aid in the diagnosis of influenza from Nasopharyngeal swab specimens and should not be used as a sole basis for treatment. Nasal washings and aspirates are unacceptable for Xpert Xpress SARS-CoV-2/FLU/RSV testing.  Fact Sheet for  Patients: BloggerCourse.com  Fact Sheet for Healthcare Providers: SeriousBroker.it  This test is not yet approved or cleared by the [place] FDA and has been authorized for detection and/or diagnosis of SARS-CoV-2 by FDA under an Emergency Use Authorization (EUA). This EUA will remain in effect (meaning this test can be used) for the duration  of the COVID-19 declaration under Section 564(b)(1) of the Act, 21 U.S.C. section 360bbb-3(b)(1), unless the authorization is terminated or revoked.     Resp Syncytial Virus by PCR NEGATIVE NEGATIVE Final    Comment: (NOTE) Fact Sheet for Patients: BloggerCourse.com  Fact Sheet for Healthcare Providers: SeriousBroker.it  This test is not yet approved or cleared by the [place] FDA and has been authorized for detection and/or diagnosis of SARS-CoV-2 by FDA under an Emergency Use Authorization (EUA). This EUA will remain in effect (meaning this test can be used) for the duration of the COVID-19 declaration under Section 564(b)(1) of the Act, 21 U.S.C. section 360bbb-3(b)(1), unless the authorization is terminated or revoked.  Performed at [facility], [address] [place], [place] [address]   Blood culture (routine x 2)     Status: None (Preliminary result)   Collection Time: 11/04/23 10:26 PM   Specimen: BLOOD  Result Value Ref Range Status   Specimen Description   Final    BLOOD Performed at [facility], [address], [place], [place] [address]    Special Requests   Final    BOTTLES DRAWN AEROBIC AND ANAEROBIC Blood Culture adequate volume Performed at [facility], [address] [place], [place] [address]    Culture   Final    NO GROWTH < 12 HOURS Performed at [facility], [address], [place], [place] [address]    Report Status PENDING   Incomplete     Radiology Studies: DG Elbow Complete Left Result Date: 11/05/2023 CLINICAL DATA:  Leukocytosis. EXAM: LEFT ELBOW - COMPLETE 3+ VIEW COMPARISON:  None FINDINGS: There are 2 orthopedic screws in the distal humerus. No acute fracture or dislocation identified. There are moderate degenerative changes of the elbow with joint space narrowing and osteophyte formation. There is also likely healed radial head fracture. There is diffuse soft tissue swelling surrounding the elbow and proximal forearm. No joint effusion identified. IMPRESSION: 1. No acute fracture or dislocation. 2. Moderate degenerative changes of the elbow. 3. Diffuse soft tissue swelling surrounding the elbow and proximal forearm. Electronically Signed   By: Darliss Cheney M.D.   On: 11/05/2023 00:46   DG Shoulder Right Result Date: 11/05/2023 CLINICAL DATA:  Leukocytosis EXAM: RIGHT SHOULDER - 2+ VIEW COMPARISON:  None Available. FINDINGS: There is no evidence of fracture or dislocation. There are mild degenerative changes of the acromioclavicular joint. Soft tissues are unremarkable. IMPRESSION: 1. No acute fracture or dislocation. 2. Mild degenerative changes of the acromioclavicular joint. Electronically Signed   By: Darliss Cheney M.D.   On: 11/05/2023 00:45   DG Chest 2 View Result Date: 11/05/2023 CLINICAL DATA:  Leukocytosis. EXAM: CHEST - 2 VIEW COMPARISON:  Chest x-ray 10/28/2023 FINDINGS: The heart size and mediastinal contours are within normal limits. Both lungs are clear. There are healed left-sided rib fractures. IMPRESSION: No active cardiopulmonary disease. Electronically Signed   By: Darliss Cheney M.D.   On: 11/05/2023 00:44   DG Ankle Complete Right Result Date: 11/04/2023 CLINICAL DATA:  Swelling and pain in right ankle, surgery 1 year ago EXAM: RIGHT ANKLE - COMPLETE 3+ VIEW COMPARISON:  05/06/2022 FINDINGS: Frontal, oblique, and lateral views of the right ankle. Lateral plate and screw fixation across the  distal fibula is noted, spanning a healed distal fibular fracture. There are no acute or destructive bony abnormalities. Mild osteoarthritis of the tibiotalar joint. Prominent inferior calcaneal spur. There is diffuse soft tissue edema, greatest  anteriorly and laterally at the ankle. IMPRESSION: 1. ORIF of a prior healed distal fibular fracture. No evidence of orthopedic hardware failure or loosening. 2. Diffuse soft tissue swelling greatest within the anterior and lateral ankle. 3. Osteoarthritis of the ankle. 4. Prominent inferior calcaneal spur. Electronically Signed   By: Sharlet Salina M.D.   On: 11/04/2023 20:15    Scheduled Meds:  amLODipine  10 mg Oral Daily   DULoxetine  60 mg Oral Daily   pantoprazole  40 mg Oral QAC breakfast   sodium chloride flush  3 mL Intravenous Q12H   Continuous Infusions:  cefTRIAXone (ROCEPHIN)  IV     lactated ringers 100 mL/hr at 11/05/23 0153   vancomycin       LOS: 0 days   Burnadette Pop, MD [facility] P2/28/2025, 7:55 AM

## 2023-11-05 NOTE — Consult Note (Signed)
 [facility]                                                                                        Patient Identification: Patient Name: Judith Drake MRN: [id number] Admit Date: 11/04/2023  5:54 PM Today's Date: 11/05/2023 Reason for consult: Polyarthralgia, rashes Requesting provider: Dr. Renold Don  Principal Problem:   Sepsis [facility]) Active Problems:   Anxiety and depression   Polyarthropathy   Polyarthralgia  Antibiotics:  Vancomycin 2/27-c Ceftriaxone 2/27-c  Lines/Hardware:  Assessment # Fevers, polyarthralgia, petechial rash # Recent h/p Group A strep pharyngitis  # Recent h/o syphilis s/p tx  # H/o inflammatory arthritis, lost to fu with Rheum and prior h/o improvement with prednisone/steroid  11/05/23 HIV NR 10/12/23 Hep B surface ag negative RPR 1: 128> 1: 64  Clinical presentation is suggestive more of an inflammatory process,. Unlikely to have septic arthritis of multiple joints without true bacteremia( Blood cx NG in < 12 hrs). Differentials considered disseminated gonococcal infection, post strep inflammatory arthritis, Acute rheumatic fevers ( less likely with appropriate tx), flare of inflammatory arthritis   Recommendations  - Continue Vancomycin, pharmacy to dose and ceftriaxone as is  - Fu blood cx for next 1 to 2 days  - Fu Urine GC, HCV ab, RVP - MRI left elbow ordered, seems to have more pain/swelling in rt ankle and left elbow. MRI rt ankle unremarkable for infective findings.   - work up/management of non infectious/inflammatory causes of polyarthralgia per primary team  Dr Renold Don following this weekend.    Rest of the management as per the primary team. Please call with questions or concerns.  Thank you for the consult  __________________________________________________________________________________________________________ HPI and Hospital Course: 53 year old  female with PMH as below including HTN, GERD, anxiety/depression, alcohol use, Fatty liver s/p right ankle ORIF, Inflammatory arthritis, Syphilis s/p 28 days of doxycycline and recent admission February 20-22 for strep pharyngitis( treated with ceftriaxone> cefadroxil) who presented to the ED on 2/27 with polyarthralgia, fevers, chills and generalized weakness.  She reports she has not been feeling well since discharge with generalized fatigue, weakness, nausea, fevers, chills and night sweats.  Started having joint pain Tuesday evening, especially in her left ankle where she had a surgery 1.5 years ago then left elbow which has screws and pins followed by right shoulder pain where no h/o prior surgery. Other sites of joint pain include neck, lower back, b/l hips, elbows, knees and b/l heels including rt MCP, difficulty with neck mobility.  She also had petechial rash involving her lower extremities approx 1.5- 2 weeks ago, non painful, itchy, non generalized- stable. No insect or tick bite.  Denies cough, chest pain, mild SOB Denies nausea, vomiting or abdominal pain or diarrhea  Mild headache but no blurry vision No GU symptoms including hematuria or blood in the stool  She reports prior diagnosis of inflammatory arthritis by Dr. Kathi Ludwig rheumatology but has not been able to see her in the last 2 years due to losing insurance.  She reports prior history of improvement of joint pain with steroids and also had injection of cortisone in her  shoulders.  She reports being sexually active only with her husband.  No prior diagnosis of chlamydia or gonorrhea.   At ED Tmax 100.4 Labs remarkable for WBC 18.2 ESR 66 CRP 15.2  lactic acid 1.1, AST 50, ALT 50 SARS-CoV-2, influenza A/B and RSV PCR negative Orthopedics and ID consulted from ED, unlikely to be septic arthritis and likely related to inflammation 2/27 and 2/28  blood cx NGTD Imaging including chest x-ray, including x-ray of right shoulder, left elbow  and MRI right ankle unremarkable for infective findings.   ROS: General- Denies loss of appetite and loss of weight HEENT - Denies blurry vision, neck pain, sinus pain Chest - Denies any chest pain, SOB or cough CVS- Denies any dizziness/lightheadedness, syncopal attacks, palpitations Abdomen- Denies any nausea, vomiting, abdominal pain, hematochezia and diarrhea Neuro - Denies any weakness, numbness, tingling sensation Psych - Denies any changes in mood irritability or depressive symptoms GU- Denies any burning, dysuria, hematuria or increased frequency of urination   Past Medical History:  Diagnosis Date   Abnormal uterine bleeding (AUB) 03/20/2019   Acute lower UTI 05/12/2019   Acute respiratory failure with hypoxia (HCC) 04/10/2018   Anemia    Arthritis    lower back and hips   Dysmenorrhea 03/20/2019   Elevated liver enzymes 11/24/2013   ETOH abuse    Fatty liver 10/03/2019   Fibroid    GERD (gastroesophageal reflux disease)    Heavy menstrual period    Hypertension    Menorrhagia 12/13/2017   SIRS (systemic inflammatory response syndrome) (HCC) 05/12/2019   Past Surgical History:  Procedure Laterality Date   BACK SURGERY     CYSTOSCOPY N/A 12/19/2019   Procedure: CYSTOSCOPY;  Surgeon: Churchill Bing, MD;  Location: [facility] OR;  Service: Gynecology;  Laterality: N/A;   ELBOW SURGERY     1997    ORIF ANKLE FRACTURE Right 03/25/2022   Procedure: OPEN REDUCTION INTERNAL FIXATION (ORIF) ANKLE FRACTURE;  Surgeon: Cammy Copa, MD;  Location: [facility] OR;  Service: Orthopedics;  Laterality: Right;   TOTAL LAPAROSCOPIC HYSTERECTOMY WITH SALPINGECTOMY Bilateral 12/19/2019   Procedure: TOTAL LAPAROSCOPIC HYSTERECTOMY WITH BILATERAL SALPINGECTOMY;  Surgeon:  Bing, MD;  Location: [facility] OR;  Service: Gynecology;  Laterality: Bilateral;   TUBAL LIGATION     Scheduled Meds:  amLODipine  10 mg Oral Daily   DULoxetine  60 mg Oral Daily   pantoprazole  40 mg Oral QAC breakfast    potassium chloride  40 mEq Oral Once   sodium chloride flush  3 mL Intravenous Q12H   Continuous Infusions:  cefTRIAXone (ROCEPHIN)  IV     lactated ringers 100 mL/hr at 11/05/23 0153   vancomycin     PRN Meds:.acetaminophen **OR** acetaminophen, bisacodyl, fentaNYL (SUBLIMAZE) injection, HYDROcodone-acetaminophen, ondansetron **OR** ondansetron (ZOFRAN) IV, senna-docusate  Allergies  Allergen Reactions   Amoxicillin Nausea And Vomiting    Has patient had a PCN reaction causing immediate rash, facial/tongue/throat swelling, SOB or lightheadedness with hypotension: No Has patient had a PCN reaction causing severe rash involving mucus membranes or skin necrosis: No Has patient had a PCN reaction that required hospitalization: No Has patient had a PCN reaction occurring within the last 10 years: No If all of the above answers are "NO", then may proceed with Cephalosporin use.    Dilaudid [Hydromorphone Hcl] Itching   Morphine And Codeine Other (See Comments)    Hallucinations    Social History   Socioeconomic History   Marital status: Married    Spouse name:  Mellody Dance   Number of children: 2   Years of education: 14   Highest education level: Not on file  Occupational History   Occupation: Homemaker   Tobacco Use   Smoking status: Some Days    Current packs/day: 0.50    Types: Cigarettes   Smokeless tobacco: Never  Vaping Use   Vaping status: Never Used  Substance and Sexual Activity   Alcohol use: Yes    Comment: She drinks vodka and cranberry, approx 6 -8 mini bottles per day   Drug use: No   Sexual activity: Not Currently    Partners: Male    Birth control/protection: Surgical  Other Topics Concern   Not on file  Social History Narrative   Anjeanette grew up in [place]. She attended [employer] for a certificate in accounting. She currently lives a home with her husband and 2 boys, 1 girl ( step daughter). Her oldest son has autism, 16 years of age.    She a  daughter with 3 grandchildren      She enjoys going out with her family.       Son is disabled Pennie Rushing).   Husband is an alcoholic   Social Drivers of Corporate investment banker Strain: Not on file  Food Insecurity: No Food Insecurity (10/30/2023)   Hunger Vital Sign    Worried About Running Out of Food in the Last Year: Never true    Ran Out of Food in the Last Year: Never true  Transportation Needs: No Transportation Needs (10/30/2023)   PRAPARE - Administrator, Civil Service (Medical): No    Lack of Transportation (Non-Medical): No  Physical Activity: Not on file  Stress: Not on file  Social Connections: Not on file  Intimate Partner Violence: Not At Risk (10/30/2023)   Humiliation, Afraid, Rape, and Kick questionnaire    Fear of Current or Ex-Partner: No    Emotionally Abused: No    Physically Abused: No    Sexually Abused: No   Family History  Problem Relation Age of Onset   Cancer Mother 47       Lung    Hyperlipidemia Mother    Hypertension Mother    Stroke Mother    Kidney disease Mother    Diabetes Mother    Heart disease Mother    Depression Mother    Hyperlipidemia Father    Cancer Paternal Grandfather 36       Colon Cancer - died   Autism Son    Breast cancer Neg Hx    Vitals BP 136/81 (BP Location: Right Arm)   Pulse 98   Temp 98.9 F (37.2 C) (Oral)   Resp 20   LMP  (LMP Unknown)   SpO2 90%    Physical Exam Constitutional: Adult female lying in the bed, mild distress due to polyarthralgia    Comments: HEENT WNL no oral ulcers  Cardiovascular:     Rate and Rhythm: Normal rate and regular rhythm.     Heart sounds: S1 and S2  Pulmonary:     Effort: Pulmonary effort is normal.     Comments: Normal breath sounds  Abdominal:     Palpations: Abdomen is soft.     Tenderness: Nondistended and nontender  Musculoskeletal:        General: Mild swelling/warmth in the left elbow and rt ankle but she is able to flex and extend passively,  painful with active range of motion.  No obvious swelling,  warmth, redness in her bilateral shoulders, right elbow bilateral hips and bilateral knee, left ankle, Has difficult range of motion actively but has good passive range of motion.   Skin:    Comments: Petechial rashes in her distal lower  but not at other sites  Neurological:     General: Awake, alert and oriented, grossly nonfocal.  She has difficulty turning her head side-to-side due to neck pain  Psychiatric:        Mood and Affect: Mood normal.    Pertinent Microbiology Results for orders placed or performed during the hospital encounter of 11/04/23  Resp panel by RT-PCR (RSV, Flu A&B, Covid) Anterior Nasal Swab     Status: None   Collection Time: 11/04/23  6:27 PM   Specimen: Anterior Nasal Swab  Result Value Ref Range Status   SARS Coronavirus 2 by RT PCR NEGATIVE NEGATIVE Final    Comment: (NOTE) SARS-CoV-2 target nucleic acids are NOT DETECTED.  The SARS-CoV-2 RNA is generally detectable in upper respiratory specimens during the acute phase of infection. The lowest concentration of SARS-CoV-2 viral copies this assay can detect is 138 copies/mL. A negative result does not preclude SARS-Cov-2 infection and should not be used as the sole basis for treatment or other patient management decisions. A negative result may occur with  improper specimen collection/handling, submission of specimen other than nasopharyngeal swab, presence of viral mutation(s) within the areas targeted by this assay, and inadequate number of viral copies(<138 copies/mL). A negative result must be combined with clinical observations, patient history, and epidemiological information. The expected result is Negative.  Fact Sheet for Patients:  BloggerCourse.com  Fact Sheet for Healthcare Providers:  SeriousBroker.it  This test is no t yet approved or cleared by the [place] FDA and  has  been authorized for detection and/or diagnosis of SARS-CoV-2 by FDA under an Emergency Use Authorization (EUA). This EUA will remain  in effect (meaning this test can be used) for the duration of the COVID-19 declaration under Section 564(b)(1) of the Act, 21 U.S.C.section 360bbb-3(b)(1), unless the authorization is terminated  or revoked sooner.       Influenza A by PCR NEGATIVE NEGATIVE Final   Influenza B by PCR NEGATIVE NEGATIVE Final    Comment: (NOTE) The Xpert Xpress SARS-CoV-2/FLU/RSV plus assay is intended as an aid in the diagnosis of influenza from Nasopharyngeal swab specimens and should not be used as a sole basis for treatment. Nasal washings and aspirates are unacceptable for Xpert Xpress SARS-CoV-2/FLU/RSV testing.  Fact Sheet for Patients: BloggerCourse.com  Fact Sheet for Healthcare Providers: SeriousBroker.it  This test is not yet approved or cleared by the [place] FDA and has been authorized for detection and/or diagnosis of SARS-CoV-2 by FDA under an Emergency Use Authorization (EUA). This EUA will remain in effect (meaning this test can be used) for the duration of the COVID-19 declaration under Section 564(b)(1) of the Act, 21 U.S.C. section 360bbb-3(b)(1), unless the authorization is terminated or revoked.     Resp Syncytial Virus by PCR NEGATIVE NEGATIVE Final    Comment: (NOTE) Fact Sheet for Patients: BloggerCourse.com  Fact Sheet for Healthcare Providers: SeriousBroker.it  This test is not yet approved or cleared by the [place] FDA and has been authorized for detection and/or diagnosis of SARS-CoV-2 by FDA under an Emergency Use Authorization (EUA). This EUA will remain in effect (meaning this test can be used) for the duration of the COVID-19 declaration under Section 564(b)(1) of the Act, 21 U.S.C. section  360bbb-3(b)(1), unless the  authorization is terminated or revoked.  Performed at [facility], [address] [place], [place] [address]   Blood culture (routine x 2)     Status: None (Preliminary result)   Collection Time: 11/04/23 10:26 PM   Specimen: BLOOD  Result Value Ref Range Status   Specimen Description   Final    BLOOD Performed at [facility], [address], [place], [place] [address]    Special Requests   Final    BOTTLES DRAWN AEROBIC AND ANAEROBIC Blood Culture adequate volume Performed at [facility], [address] [place], [place] [address]    Culture   Final    NO GROWTH < 12 HOURS Performed at [facility], [address], [place], [place] [address]    Report Status PENDING  Incomplete   Pertinent Lab seen by me:    Latest Ref Rng & Units 11/05/2023    8:55 AM 11/04/2023    6:27 PM 10/30/2023    7:11 AM  CBC  WBC 4.0 - 10.5 K/uL 17.1  18.2  11.9   Hemoglobin 12.0 - 15.0 g/dL 65.7  84.6  96.2   Hematocrit 36.0 - 46.0 % 37.7  47.1  46.6   Platelets 150 - 400 K/uL 435  516  233       Latest Ref Rng & Units 11/05/2023    8:55 AM 11/04/2023   10:26 PM 11/04/2023    6:27 PM  CMP  Glucose 70 - 99 mg/dL 952   841   BUN 6 - 20 mg/dL 8   9   Creatinine 3.24 - 1.00 mg/dL 4.01   0.27   Sodium 253 - 145 mmol/L 131   133   Potassium 3.5 - 5.1 mmol/L 3.3   3.0   Chloride 98 - 111 mmol/L 94   92   CO2 22 - 32 mmol/L 25   25   Calcium 8.9 - 10.3 mg/dL 9.0   9.8   Total Protein 6.5 - 8.1 g/dL 7.7  8.6    Total Bilirubin 0.0 - 1.2 mg/dL 1.2  1.2    Alkaline Phos 38 - 126 U/L 73  79    AST 15 - 41 U/L 45  50    ALT 0 - 44 U/L 41  50       Pertinent Imagings/Other Imagings Plain films and CT images have been personally visualized and interpreted; radiology reports have been reviewed. Decision making incorporated into the Impression / Recommendations.  MR ANKLE RIGHT W WO CONTRAST Result Date: 11/05/2023 CLINICAL DATA:   Worsening right ankle pain. History of prior surgery. Concern for septic arthritis. Fevers, chills, and body aches. EXAM: MRI OF THE RIGHT ANKLE WITHOUT AND WITH CONTRAST TECHNIQUE: Multiplanar, multisequence MR imaging of the ankle was performed before and after the administration of intravenous contrast. CONTRAST:  7mL GADAVIST GADOBUTROL 1 MMOL/ML IV SOLN COMPARISON:  Right ankle x-rays from yesterday. FINDINGS: TENDONS Peroneal: Peroneal longus tendon intact. Peroneal brevis intact. Posteromedial: Posterior tibial tendon intact. Flexor digitorum longus tendon intact. Flexor hallucis longus tendon intact. Anterior: Tibialis anterior tendon intact. Extensor hallucis longus tendon intact. Extensor digitorum longus tendon intact. Achilles:  Intact. Plantar Fascia: Intact. LIGAMENTS Lateral: Anterior talofibular ligament intact. Calcaneofibular ligament intact. Posterior talofibular ligament intact. Anterior and posterior tibiofibular ligaments intact. Medial: Deltoid ligament intact. Spring ligament intact. CARTILAGE Ankle Joint: No joint effusion. Normal ankle mortise. Mild partial-thickness cartilage loss. Subtalar Joints/Sinus Tarsi:  Normal subtalar joints. No subtalar joint effusion. Normal sinus tarsi. Bones: No worrisome marrow signal abnormality. Prior distal fibula ORIF with associated hardware susceptibility artifact. Healed posterior malleolar fracture with irregularity of the articular surface. Old avulsion fracture of the medial malleolus. No acute fracture or dislocation. No suspicious bone lesion. Soft Tissue: Lateral hindfoot soft tissue swelling. No soft tissue mass or fluid collection. IMPRESSION: IMPRESSION 1. Nonspecific lateral hindfoot soft tissue swelling. No evidence of septic arthritis or osteomyelitis. 2. Old trimalleolar fracture status post distal fibula ORIF. 3. Mild posttraumatic tibiotalar osteoarthritis. Electronically Signed   By: Obie Dredge M.D.   On: 11/05/2023 08:32   DG  Elbow Complete Left Result Date: 11/05/2023 CLINICAL DATA:  Leukocytosis. EXAM: LEFT ELBOW - COMPLETE 3+ VIEW COMPARISON:  None FINDINGS: There are 2 orthopedic screws in the distal humerus. No acute fracture or dislocation identified. There are moderate degenerative changes of the elbow with joint space narrowing and osteophyte formation. There is also likely healed radial head fracture. There is diffuse soft tissue swelling surrounding the elbow and proximal forearm. No joint effusion identified. IMPRESSION: 1. No acute fracture or dislocation. 2. Moderate degenerative changes of the elbow. 3. Diffuse soft tissue swelling surrounding the elbow and proximal forearm. Electronically Signed   By: Darliss Cheney M.D.   On: 11/05/2023 00:46   DG Shoulder Right Result Date: 11/05/2023 CLINICAL DATA:  Leukocytosis EXAM: RIGHT SHOULDER - 2+ VIEW COMPARISON:  None Available. FINDINGS: There is no evidence of fracture or dislocation. There are mild degenerative changes of the acromioclavicular joint. Soft tissues are unremarkable. IMPRESSION: 1. No acute fracture or dislocation. 2. Mild degenerative changes of the acromioclavicular joint. Electronically Signed   By: Darliss Cheney M.D.   On: 11/05/2023 00:45   DG Chest 2 View Result Date: 11/05/2023 CLINICAL DATA:  Leukocytosis. EXAM: CHEST - 2 VIEW COMPARISON:  Chest x-ray 10/28/2023 FINDINGS: The heart size and mediastinal contours are within normal limits. Both lungs are clear. There are healed left-sided rib fractures. IMPRESSION: No active cardiopulmonary disease. Electronically Signed   By: Darliss Cheney M.D.   On: 11/05/2023 00:44   DG Ankle Complete Right Result Date: 11/04/2023 CLINICAL DATA:  Swelling and pain in right ankle, surgery 1 year ago EXAM: RIGHT ANKLE - COMPLETE 3+ VIEW COMPARISON:  05/06/2022 FINDINGS: Frontal, oblique, and lateral views of the right ankle. Lateral plate and screw fixation across the distal fibula is noted, spanning a healed  distal fibular fracture. There are no acute or destructive bony abnormalities. Mild osteoarthritis of the tibiotalar joint. Prominent inferior calcaneal spur. There is diffuse soft tissue edema, greatest anteriorly and laterally at the ankle. IMPRESSION: 1. ORIF of a prior healed distal fibular fracture. No evidence of orthopedic hardware failure or loosening. 2. Diffuse soft tissue swelling greatest within the anterior and lateral ankle. 3. Osteoarthritis of the ankle. 4. Prominent inferior calcaneal spur. Electronically Signed   By: Sharlet Salina M.D.   On: 11/04/2023 20:15   DG FL GUIDED LUMBAR PUNCTURE Result Date: 10/29/2023 CLINICAL DATA:  Patient with history of tertiary syphilis, recent fever, neck pain, headache, leukocytosis. EXAM: LUMBAR PUNCTURE UNDER FLUOROSCOPY PROCEDURE: An appropriate skin entry site was determined fluoroscopically. Operator donned sterile gloves and mask. Skin site was marked, then prepped with Betadine, draped in usual sterile fashion, and infiltrated locally with 1% lidocaine. A 20 gauge spinal needle advanced into the thecal sac initially at L3-4, then repositioned to L 4-5 from an interlaminar approach. Clear colorless CSF spontaneously returned. 9  ml CSF were collected and divided among 4 sterile vials for the requested laboratory studies. The needle was then removed. The patient tolerated the procedure well and there were no complications. FLUOROSCOPY: Radiation Exposure Index (as provided by the fluoroscopic device): 9.5 mGy Kerma IMPRESSION: Successful lumbar puncture under fluoroscopy. This exam was performed by Artemio Aly and was supervised and interpreted by Dr. Roanna Banning Electronically Signed   By: Roanna Banning M.D.   On: 10/29/2023 17:10   MR BRAIN W WO CONTRAST Result Date: 10/29/2023 CLINICAL DATA:  Provided history: Headache, sudden, severe. Neck pain. Recent syphylis. EXAM: MRI HEAD WITHOUT AND WITH CONTRAST TECHNIQUE: Multiplanar, multiecho pulse  sequences of the brain and surrounding structures were obtained without and with intravenous contrast. CONTRAST:  7mL GADAVIST GADOBUTROL 1 MMOL/ML IV SOLN COMPARISON:  Brain MRI 01/16/2022. FINDINGS: Brain: Mild generalized cerebral atrophy. 7 mm pineal cyst. No cortical encephalomalacia is identified. No significant cerebral white matter disease. There is no acute infarct. No evidence of an intracranial mass. No chronic intracranial blood products. No extra-axial fluid collection. No midline shift. No pathologic intracranial enhancement identified. Vascular: Maintained flow voids within the proximal large arterial vessels. Skull and upper cervical spine: No focal worrisome marrow lesion. Incompletely assessed cervical spondylosis. Sinuses/Orbits: No mass or acute finding within the imaged orbits. Minimal mucosal thickening within the bilateral ethmoid and right sphenoid sinuses. Other: Bilateral mastoid effusions. Cervical lymphadenopathy at the imaged levels. For instance, a left level II lymph node measures 15 mm in short axis (series 7, image 19). IMPRESSION: 1. No evidence of an acute intracranial abnormality. 2. Mild generalized cerebral atrophy. 3. Unchanged 7 mm pineal cyst. 4. Otherwise unremarkable MRI appearance of the brain. 5. Minor paranasal sinus mucosal thickening. 6. Bilateral mastoid effusions. 7. Incompletely assessed cervical lymphadenopathy. Electronically Signed   By: Jackey Loge D.O.   On: 10/29/2023 10:21   DG Chest 2 View Result Date: 10/28/2023 CLINICAL DATA:  Fever.  Flu-like symptoms. EXAM: CHEST - 2 VIEW COMPARISON:  Radiograph and CT 09/08/2022 FINDINGS: The cardiomediastinal contours are normal. Bronchial thickening. Pulmonary vasculature is normal. No consolidation, pleural effusion, or pneumothorax. Multiple remote bilateral rib fractures. No acute osseous abnormalities are seen. IMPRESSION: Bronchial thickening without focal airspace disease. Electronically Signed   By: Narda Rutherford M.D.   On: 10/28/2023 18:17    I have personally spent 88 minutes involved in face-to-face and non-face-to-face activities for this patient on the day of the visit. Professional time spent includes the following activities: Preparing to see the patient (review of tests), Obtaining and/or reviewing separately obtained history (admission/discharge record), Performing a medically appropriate examination and/or evaluation , Ordering medications/tests/procedures, referring and communicating with other health care professionals, Documenting clinical information in the EMR, Independently interpreting results (not separately reported), Communicating results to the patient/family/caregiver, Counseling and educating the patient/family/caregiver and Care coordination (not separately reported).  Electronically signed by:   Plan d/w requesting provider as well as ID pharm D  Of note, portions of this note may have been created with voice recognition software. While this note has been edited for accuracy, occasional wrong-word or 'sound-a-like' substitutions may have occurred due to the inherent limitations of voice recognition software.   Odette Fraction, MD Infectious Disease Physician [facility] Pager: [phone]

## 2023-11-05 NOTE — Progress Notes (Signed)
   11/05/23 1652  Assess: MEWS Score  Temp 99.4 F (37.4 C)  BP 127/80  MAP (mmHg) 95  Pulse Rate (!) 112  Resp 16  SpO2 92 %  O2 Device Room Air  Assess: MEWS Score  MEWS Temp 0  MEWS Systolic 0  MEWS Pulse 2  MEWS RR 0  MEWS LOC 0  MEWS Score 2  MEWS Score Color Yellow  Assess: if the MEWS score is Yellow or Red  Were vital signs accurate and taken at a resting state? Yes  Does the patient meet 2 or more of the SIRS criteria? Yes  Does the patient have a confirmed or suspected source of infection? Yes  MEWS guidelines implemented  Yes, yellow  Treat  MEWS Interventions Considered administering scheduled or prn medications/treatments as ordered  Take Vital Signs  Increase Vital Sign Frequency  Yellow: Q2hr x1, continue Q4hrs until patient remains green for 12hrs  Escalate  MEWS: Escalate Yellow: Discuss with charge nurse and consider notifying provider and/or RRT  Notify: Charge Nurse/RN  Name of Charge Nurse/RN Notified Renda Rolls, RN  Provider Notification  Provider Name/Title Renford Dills, MD  Date Provider Notified 11/05/23  Time Provider Notified 1700  Method of Notification Page  Notification Reason Other (Comment) (yellow MEWs, elevated HR)  Provider response No new orders (continue abx and fluids)  Date of Provider Response 11/05/23  Time of Provider Response 1701  Assess: SIRS CRITERIA  SIRS Temperature  0  SIRS Respirations  0  SIRS Pulse 1  SIRS WBC 1  SIRS Score Sum  2

## 2023-11-06 ENCOUNTER — Inpatient Hospital Stay (HOSPITAL_COMMUNITY)

## 2023-11-06 DIAGNOSIS — M130 Polyarthritis, unspecified: Secondary | ICD-10-CM | POA: Diagnosis not present

## 2023-11-06 DIAGNOSIS — F32A Depression, unspecified: Secondary | ICD-10-CM

## 2023-11-06 DIAGNOSIS — A419 Sepsis, unspecified organism: Secondary | ICD-10-CM | POA: Diagnosis not present

## 2023-11-06 DIAGNOSIS — M2550 Pain in unspecified joint: Secondary | ICD-10-CM | POA: Diagnosis not present

## 2023-11-06 DIAGNOSIS — F419 Anxiety disorder, unspecified: Secondary | ICD-10-CM

## 2023-11-06 DIAGNOSIS — R509 Fever, unspecified: Secondary | ICD-10-CM | POA: Diagnosis not present

## 2023-11-06 LAB — CBC
HCT: 42.7 % (ref 36.0–46.0)
Hemoglobin: 13.6 g/dL (ref 12.0–15.0)
MCH: 31.9 pg (ref 26.0–34.0)
MCHC: 31.9 g/dL (ref 30.0–36.0)
MCV: 100.2 fL — ABNORMAL HIGH (ref 80.0–100.0)
Platelets: 487 10*3/uL — ABNORMAL HIGH (ref 150–400)
RBC: 4.26 MIL/uL (ref 3.87–5.11)
RDW: 14.6 % (ref 11.5–15.5)
WBC: 16.9 10*3/uL — ABNORMAL HIGH (ref 4.0–10.5)
nRBC: 0 % (ref 0.0–0.2)

## 2023-11-06 LAB — RESPIRATORY PANEL BY PCR

## 2023-11-06 LAB — BASIC METABOLIC PANEL
Anion gap: 14 (ref 5–15)
BUN: 5 mg/dL — ABNORMAL LOW (ref 6–20)
CO2: 26 mmol/L (ref 22–32)
Calcium: 9.2 mg/dL (ref 8.9–10.3)
Chloride: 99 mmol/L (ref 98–111)
Creatinine, Ser: 0.49 mg/dL (ref 0.44–1.00)
GFR, Estimated: >60 mL/min (ref >60)
Glucose, Bld: 88 mg/dL (ref 70–99)
Potassium: 3.5 mmol/L (ref 3.5–5.1)
Sodium: 139 mmol/L (ref 135–145)

## 2023-11-06 LAB — TSH: TSH: 4.887 u[IU]/mL — ABNORMAL HIGH (ref 0.350–4.500)

## 2023-11-06 MED ORDER — GADOBUTROL 1 MMOL/ML IV SOLN
7.0000 mL | Freq: Once | INTRAVENOUS | Status: AC | PRN
  Administered 2023-11-06: 7 mL via INTRAVENOUS

## 2023-11-06 MED ORDER — DIPHENHYDRAMINE HCL 25 MG PO CAPS
25.0000 mg | ORAL_CAPSULE | Freq: Once | ORAL | Status: AC | PRN
  Administered 2023-11-06: 25 mg via ORAL
  Filled 2023-11-06: qty 1

## 2023-11-06 MED ORDER — DIPHENHYDRAMINE HCL 25 MG PO CAPS
25.0000 mg | ORAL_CAPSULE | Freq: Four times a day (QID) | ORAL | Status: AC | PRN
  Administered 2023-11-06 (×2): 25 mg via ORAL
  Filled 2023-11-06 (×2): qty 1

## 2023-11-06 NOTE — Progress Notes (Signed)
 [facility]  PROGRESS NOTE  Judith Drake [id number] DOB: July 24, 1971 DOA: 11/04/2023 PCP: Allegra Grana, FNP   Brief HPI:   53 year old female with history of hypertension, depression/anxiety, alcohol use, recent diagnosis of syphilis and completed  4 weeks of doxycycline, recent admission for sepsis due to streptococcal pharyngitis s/p treatment with ceftriaxone who presented to the emergency department with complaint of polyarthropathy, fever, chills.  Developed pain and swelling in multiple joints  about 3 days ago.Also developed petechial rash involving her extremities.  On presentation, she was hemodynamically stable.  Labs showed leukocytosis of 18.2, potassium of 3, elevated ESR/CRP.  ID consulted, plan for testing GC/chlamydia.  Started on empiric vancomycin, ceftriaxone.  Culture sent     Assessment/Plan:   Symmetrical polyarthropathy/sepsis: -Presented with leukocytosis, tachycardia, fever, polyarthralgia -She had recent treatment for syphilis and group A strep pharyngitis -ID and orthopedics was consulted.  Patient was started on Rocephin and vancomycin -GC/chlamydia urine obtained, result is currently pending -Patient does have history of inflammatory arthritis, has been seen by rheumatologist as outpatient -She was on prednisone at 1 point with significant improvement however because of the insurance issues she could not be on prednisone.  She was started on hydroxychloroquine however patient does not want to take that. -MRI of left elbow is currently pending -Discussed with ID, patient will likely benefit from being on steroids as this is not likely an infectious process.   History of right ankle fracture: X-ray showed ORIF of prior healed distal fibula fracture. No evidence of orthopedic hardware failure or loosening. Diffuse soft tissue swelling greatest within the anterior and lateral ankle.  Orthopedics also consulted.  No plan for any  intervention  History of left elbow injury, she has hardware in left elbow.  X-ray of the left elbow does not show any loosening of hardware.  No intervention recommended per orthopedics.   History of syphilis: Recently completed 28 days treatment with doxycycline.   Hypokalemia: Currently being monitored and supplemented as needed   Hypertension: Continue amlodipine   Depression/anxiety: Continue Cymbalta         amLODipine  10 mg Oral Daily   DULoxetine  60 mg Oral Daily   enoxaparin (LOVENOX) injection  40 mg Subcutaneous Q24H   pantoprazole  40 mg Oral QAC breakfast   sodium chloride flush  3 mL Intravenous Q12H     Data Reviewed:   CBG:  No results for input(s): "GLUCAP" in the last 168 hours.  SpO2: 91 %    Vitals:   11/05/23 1853 11/05/23 2245 11/06/23 0219 11/06/23 0903  BP: 133/83 134/86 126/83 (!) 145/95  Pulse: (!) 123 99 (!) 108   Resp: 20 16 20    Temp: 98.6 F (37 C) 98.7 F (37.1 C) 98.8 F (37.1 C)   TempSrc: Oral Oral Oral   SpO2: 92% 91% 91%   Weight:      Height:          Data Reviewed:  Basic Metabolic Panel: Recent Labs  Lab 11/04/23 1827 11/05/23 0855 11/06/23 0438  NA 133* 131* 139  K 3.0* 3.3* 3.5  CL 92* 94* 99  CO2 25 25 26   GLUCOSE 121* 107* 88  BUN 9 8 5*  CREATININE 0.64 0.47 0.49  CALCIUM 9.8 9.0 9.2    CBC: Recent Labs  Lab 11/04/23 1827 11/05/23 0855 11/06/23 0438  WBC 18.2* 17.1* 16.9*  NEUTROABS 14.9*  --   --   HGB 15.7* 13.0 13.6  HCT 47.1* 37.7  42.7  MCV 94.6 94.5 100.2*  PLT 516* 435* 487*    LFT Recent Labs  Lab 11/04/23 2226 11/05/23 0855  AST 50* 45*  ALT 50* 41  ALKPHOS 79 73  BILITOT 1.2 1.2  PROT 8.6* 7.7  ALBUMIN 3.9 3.6     Antibiotics: Anti-infectives (From admission, onward)    Start     Dose/Rate Route Frequency Ordered Stop   11/05/23 2300  cefTRIAXone (ROCEPHIN) 2 g in sodium chloride 0.9 % 100 mL IVPB        2 g 200 mL/hr over 30 Minutes Intravenous Every 24 hours  11/05/23 0125     11/05/23 2200  vancomycin (VANCOREADY) IVPB 1500 mg/300 mL        1,500 mg 150 mL/hr over 120 Minutes Intravenous Every 24 hours 11/04/23 2352     11/04/23 2315  vancomycin (VANCOREADY) IVPB 1500 mg/300 mL        1,500 mg 150 mL/hr over 120 Minutes Intravenous  Once 11/04/23 2302 11/05/23 0522   11/04/23 2300  cefTRIAXone (ROCEPHIN) 2 g in sodium chloride 0.9 % 100 mL IVPB        2 g 200 mL/hr over 30 Minutes Intravenous  Once 11/04/23 2247 11/05/23 0100        DVT prophylaxis: Enoxaparin  Code Status: Full code  Family Communication: Discussed with patient husband at bedside   CONSULTS infectious disease, orthopedics   Subjective   Complains of worsening swelling and pain in the left elbow.  MRI of the left elbow is currently pending   Objective    Physical Examination:  General-appears in no acute distress Heart-S1-S2, regular, no murmur auscultated Lungs-clear to auscultation bilaterally, no wheezing or crackles auscultated Abdomen-soft, nontender, no organomegaly Extremities-significant edema noted at the left elbow, tender to palpation, mild erythema, warm to touch. Joints-tenderness noted at the right shoulder, both knee joints, right ankle Neuro-alert, oriented x3, no focal deficit noted  Status is: Inpatient:             [name]   [facility] If 7PM-7AM, please contact night-coverage at [email or web address], Office  [phone]   11/06/2023, 9:17 AM  LOS: 1 day

## 2023-11-06 NOTE — Plan of Care (Signed)
   Problem: Fluid Volume: Goal: Hemodynamic stability will improve Outcome: Progressing   Problem: Clinical Measurements: Goal: Diagnostic test results will improve Outcome: Progressing Goal: Signs and symptoms of infection will decrease Outcome: Progressing   Problem: Respiratory: Goal: Ability to maintain adequate ventilation will improve Outcome: Progressing

## 2023-11-07 DIAGNOSIS — A419 Sepsis, unspecified organism: Secondary | ICD-10-CM | POA: Diagnosis not present

## 2023-11-07 DIAGNOSIS — M2550 Pain in unspecified joint: Secondary | ICD-10-CM | POA: Diagnosis not present

## 2023-11-07 DIAGNOSIS — M130 Polyarthritis, unspecified: Secondary | ICD-10-CM | POA: Diagnosis not present

## 2023-11-07 DIAGNOSIS — R509 Fever, unspecified: Secondary | ICD-10-CM | POA: Diagnosis not present

## 2023-11-07 LAB — COMPREHENSIVE METABOLIC PANEL
ALT: 53 U/L — ABNORMAL HIGH (ref 0–44)
AST: 76 U/L — ABNORMAL HIGH (ref 15–41)
Albumin: 3.1 g/dL — ABNORMAL LOW (ref 3.5–5.0)
Alkaline Phosphatase: 87 U/L (ref 38–126)
Anion gap: 11 (ref 5–15)
BUN: <5 mg/dL — ABNORMAL LOW (ref 6–20)
CO2: 27 mmol/L (ref 22–32)
Calcium: 8.6 mg/dL — ABNORMAL LOW (ref 8.9–10.3)
Chloride: 99 mmol/L (ref 98–111)
Creatinine, Ser: 0.45 mg/dL (ref 0.44–1.00)
GFR, Estimated: >60 mL/min (ref >60)
Glucose, Bld: 97 mg/dL (ref 70–99)
Potassium: 3.1 mmol/L — ABNORMAL LOW (ref 3.5–5.1)
Sodium: 137 mmol/L (ref 135–145)
Total Bilirubin: 0.5 mg/dL (ref 0.0–1.2)
Total Protein: 7.4 g/dL (ref 6.5–8.1)

## 2023-11-07 LAB — CBC
HCT: 39.4 % (ref 36.0–46.0)
Hemoglobin: 12.5 g/dL (ref 12.0–15.0)
MCH: 31.4 pg (ref 26.0–34.0)
MCHC: 31.7 g/dL (ref 30.0–36.0)
MCV: 99 fL (ref 80.0–100.0)
Platelets: 474 10*3/uL — ABNORMAL HIGH (ref 150–400)
RBC: 3.98 MIL/uL (ref 3.87–5.11)
RDW: 14 % (ref 11.5–15.5)
WBC: 14.8 10*3/uL — ABNORMAL HIGH (ref 4.0–10.5)
nRBC: 0 % (ref 0.0–0.2)

## 2023-11-07 LAB — GC/CHLAMYDIA PROBE AMP (~~LOC~~) NOT AT ARMC
Chlamydia: NEGATIVE
Comment: NEGATIVE
Comment: NORMAL
Neisseria Gonorrhea: NEGATIVE

## 2023-11-07 MED ORDER — POTASSIUM CHLORIDE CRYS ER 20 MEQ PO TBCR
40.0000 meq | EXTENDED_RELEASE_TABLET | ORAL | Status: AC
  Administered 2023-11-07 (×2): 40 meq via ORAL
  Filled 2023-11-07 (×2): qty 2

## 2023-11-07 MED ORDER — KETOROLAC TROMETHAMINE 30 MG/ML IJ SOLN
30.0000 mg | Freq: Once | INTRAMUSCULAR | Status: AC
  Administered 2023-11-07: 30 mg via INTRAVENOUS
  Filled 2023-11-07: qty 1

## 2023-11-07 MED ORDER — SACCHAROMYCES BOULARDII 250 MG PO CAPS
250.0000 mg | ORAL_CAPSULE | Freq: Two times a day (BID) | ORAL | Status: DC
  Administered 2023-11-07 – 2023-11-10 (×7): 250 mg via ORAL
  Filled 2023-11-07 (×7): qty 1

## 2023-11-07 MED ORDER — VANCOMYCIN HCL 1500 MG/300ML IV SOLN
1500.0000 mg | INTRAVENOUS | Status: DC
  Administered 2023-11-07 – 2023-11-08 (×2): 1500 mg via INTRAVENOUS
  Filled 2023-11-07 (×2): qty 300

## 2023-11-07 MED ORDER — HYDROXYZINE HCL 25 MG PO TABS
25.0000 mg | ORAL_TABLET | Freq: Three times a day (TID) | ORAL | Status: DC | PRN
  Administered 2023-11-07 – 2023-11-10 (×8): 25 mg via ORAL
  Filled 2023-11-07 (×9): qty 1

## 2023-11-07 MED ORDER — PREDNISONE 20 MG PO TABS
40.0000 mg | ORAL_TABLET | Freq: Once | ORAL | Status: AC
  Administered 2023-11-07: 40 mg via ORAL
  Filled 2023-11-07: qty 4

## 2023-11-07 NOTE — Progress Notes (Signed)
 [facility]  PROGRESS NOTE  Judith Drake ZOX:[id number] DOB: October 14, 1970 DOA: 11/04/2023 PCP: Allegra Grana, FNP   Brief HPI:   53 year old female with history of hypertension, depression/anxiety, alcohol use, recent diagnosis of syphilis and completed  4 weeks of doxycycline, recent admission for sepsis due to streptococcal pharyngitis s/p treatment with ceftriaxone who presented to the emergency department with complaint of polyarthropathy, fever, chills.  Developed pain and swelling in multiple joints  about 3 days ago.Also developed petechial rash involving her extremities.  On presentation, she was hemodynamically stable.  Labs showed leukocytosis of 18.2, potassium of 3, elevated ESR/CRP.  ID consulted, plan for testing GC/chlamydia.  Started on empiric vancomycin, ceftriaxone.  Culture sent     Assessment/Plan:   Symmetrical polyarthropathy/sepsis: -Presented with leukocytosis, tachycardia, fever, polyarthralgia -She had recent treatment for syphilis and group A strep pharyngitis -ID and orthopedics was consulted.  Patient was started on Rocephin and vancomycin -GC/chlamydia urine obtained, result is currently pending -Patient does have history of inflammatory arthritis, has been seen by rheumatologist as outpatient -She was on prednisone at 1 point with significant improvement however because of the insurance issues she could not be on prednisone.  She was started on hydroxychloroquine however patient does not want to take that. -MRI of left elbow is currently pending -Discussed with ID, patient will likely benefit from being on steroids as this is not likely an infectious process. -ID wants to  discuss with patient's rheumatologist before giving prednisone -Will give Toradol 30 mg IV x 1   History of right ankle fracture: X-ray showed ORIF of prior healed distal fibula fracture. No evidence of orthopedic hardware failure or loosening. Diffuse soft tissue swelling  greatest within the anterior and lateral ankle.  Orthopedics also consulted.  No plan for any intervention  History of left elbow injury, she has hardware in left elbow.  X-ray of the left elbow does not show any loosening of hardware.  No intervention recommended per orthopedics. -MRI of left elbow did not show osteomyelitis or drainable abscess   History of syphilis: Recently completed 28 days treatment with doxycycline.  Will start Florastor.   Hypokalemia: Currently being monitored and supplemented as needed   Hypertension: Continue amlodipine   Depression/anxiety: Continue Cymbalta  Transaminitis -Unclear etiology -Follow LFTs in a.m.       amLODipine  10 mg Oral Daily   DULoxetine  60 mg Oral Daily   enoxaparin (LOVENOX) injection  40 mg Subcutaneous Q24H   pantoprazole  40 mg Oral QAC breakfast   sodium chloride flush  3 mL Intravenous Q12H     Data Reviewed:   CBG:  No results for input(s): "GLUCAP" in the last 168 hours.  SpO2: 100 %    Vitals:   11/06/23 1400 11/06/23 1955 11/06/23 2126 11/07/23 0515  BP: 129/87 137/80 123/80 109/73  Pulse: (!) 102 (!) 102 (!) 114 91  Resp: 18 16 18 17   Temp: 98.8 F (37.1 C) 98.3 F (36.8 C) 100.2 F (37.9 C) 98.9 F (37.2 C)  TempSrc: Oral Oral Tympanic Oral  SpO2: 97% 93% 97% 100%  Weight:      Height:          Data Reviewed:  Basic Metabolic Panel: Recent Labs  Lab 11/04/23 1827 11/05/23 0855 11/06/23 0438 11/07/23 0429  NA 133* 131* 139 137  K 3.0* 3.3* 3.5 3.1*  CL 92* 94* 99 99  CO2 25 25 26 27   GLUCOSE 121* 107* 88 97  BUN  9 8 5* <5*  CREATININE 0.64 0.47 0.49 0.45  CALCIUM 9.8 9.0 9.2 8.6*    CBC: Recent Labs  Lab 11/04/23 1827 11/05/23 0855 11/06/23 0438 11/07/23 0429  WBC 18.2* 17.1* 16.9* 14.8*  NEUTROABS 14.9*  --   --   --   HGB 15.7* 13.0 13.6 12.5  HCT 47.1* 37.7 42.7 39.4  MCV 94.6 94.5 100.2* 99.0  PLT 516* 435* 487* 474*    LFT Recent Labs  Lab 11/04/23 2226  11/05/23 0855 11/07/23 0429  AST 50* 45* 76*  ALT 50* 41 53*  ALKPHOS 79 73 87  BILITOT 1.2 1.2 0.5  PROT 8.6* 7.7 7.4  ALBUMIN 3.9 3.6 3.1*     Antibiotics: Anti-infectives (From admission, onward)    Start     Dose/Rate Route Frequency Ordered Stop   11/05/23 2300  cefTRIAXone (ROCEPHIN) 2 g in sodium chloride 0.9 % 100 mL IVPB        2 g 200 mL/hr over 30 Minutes Intravenous Every 24 hours 11/05/23 0125     11/05/23 2200  vancomycin (VANCOREADY) IVPB 1500 mg/300 mL        1,500 mg 150 mL/hr over 120 Minutes Intravenous Every 24 hours 11/04/23 2352     11/04/23 2315  vancomycin (VANCOREADY) IVPB 1500 mg/300 mL        1,500 mg 150 mL/hr over 120 Minutes Intravenous  Once 11/04/23 2302 11/05/23 0522   11/04/23 2300  cefTRIAXone (ROCEPHIN) 2 g in sodium chloride 0.9 % 100 mL IVPB        2 g 200 mL/hr over 30 Minutes Intravenous  Once 11/04/23 2247 11/05/23 0100        DVT prophylaxis: Enoxaparin  Code Status: Full code  Family Communication: Discussed with patient husband at bedside   CONSULTS infectious disease, orthopedics   Subjective    Patient seen and examined, continues to have pain.  MRI of the left elbow obtained yesterday did not show any drainable fluid collection.  No osteomyelitis.  Objective    Physical Examination:  General-appears in no acute distress Heart-S1-S2, regular, no murmur auscultated Lungs-clear to auscultation bilaterally, no wheezing or crackles auscultated Abdomen-soft, nontender, no organomegaly Extremities-no edema in the lower extremities Neuro-alert, oriented x3, no focal deficit noted  Status is: Inpatient:             [name]   [facility] If 7PM-7AM, please contact night-coverage at [email or web address], Office  [phone]   11/07/2023, 8:14 AM  LOS: 2 days

## 2023-11-07 NOTE — TOC Initial Note (Signed)
 Transition of Care [facility]) - Initial/Assessment Note    Patient Details  Name: Judith Drake MRN: [id number] Date of Birth: [birth year]  [facility] CM/SW Contact:    Diona Browner, LCSW Phone Number: 11/07/2023, 12:32 PM  Clinical Narrative:                 Pt from home with spouse. TOC following for needs.  Expected Discharge Plan: Home/Self Care Barriers to Discharge: Continued Medical Work up   Patient Goals and CMS Choice Patient states their goals for this hospitalization and ongoing recovery are:: return home          Expected Discharge Plan and Services       Living arrangements for the past 2 months: Single Family Home                                      Prior Living Arrangements/Services Living arrangements for the past 2 months: Single Family Home Lives with:: Spouse Patient language and need for interpreter reviewed:: Yes Do you feel safe going back to the place where you live?: Yes      Need for Family Participation in Patient Care: Yes (Comment) Care giver support system in place?: Yes (comment)   Criminal Activity/Legal Involvement Pertinent to Current Situation/Hospitalization: No - Comment as needed  Activities of Daily Living   ADL Screening (condition at time of admission) Independently performs ADLs?: Yes (appropriate for developmental age) Is the patient deaf or have difficulty hearing?: No Does the patient have difficulty seeing, even when wearing glasses/contacts?: No Does the patient have difficulty concentrating, remembering, or making decisions?: No  Permission Sought/Granted                  Emotional Assessment Appearance:: Appears stated age Attitude/Demeanor/Rapport: Engaged Affect (typically observed): Accepting Orientation: : Oriented to Self, Oriented to Place, Oriented to  Time, Oriented to Situation Alcohol / Substance Use: Not Applicable Psych Involvement: No (comment)  Admission diagnosis:   Polyarthralgia [M25.50] Sepsis (HCC) [A41.9] Fever, unspecified fever cause [R50.9] 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] Patient Active Problem List   Diagnosis Date Noted   Sepsis (HCC) 11/05/2023   Polyarthritis 11/05/2023   Polyarthralgia 11/05/2023   Fever 11/05/2023   Joint swelling 11/04/2023   Syphilis, unspecified 11/04/2023   Streptococcal pharyngitis 10/30/2023   SIRS (systemic inflammatory response syndrome) (HCC) 10/28/2023   DM (diabetes mellitus) (HCC) 08/25/2023   IDA (iron deficiency anemia) 08/17/2023   Vision changes 08/17/2023   Hoarseness of voice 08/10/2023   Muscle cramps at night 08/10/2023   Oral thrush 08/10/2023   Closed right ankle fracture 03/24/2022   Vitamin D deficiency 01/21/2022   HLD (hyperlipidemia) 01/21/2022   TIA (transient ischemic attack) 01/16/2022   OAB (overactive bladder) 01/14/2022   Hand pain 07/19/2020   Dysplasia of cervix, 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   Status post hysterectomy 12/19/2019   Elevated transaminase level 12/18/2019   Hypomagnesemia    Nonallopathic lesion of sacral region 03/28/2018   Nonallopathic lesion of thoracic region 03/28/2018   Nonallopathic lesion of lumbosacral region 03/28/2018   Nonallopathic lesion of pelvic region 03/28/2018   Nonallopathic lesion of cervical region 03/28/2018   Enlarged thyroid 03/07/2018   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   Lumbar radiculopathy, right 02/08/2018   Anxiety and depression 01/03/2018   Rash 01/03/2018   Right hip pain 01/03/2018   GERD (  gastroesophageal reflux disease) 12/13/2017   Hypokalemia 12/13/2017   Depression 11/24/2013   Fatigue 07/12/2013   Essential hypertension 07/12/2013   Alcohol abuse 07/12/2013   Tobacco abuse 07/12/2013   Yeast infection of the skin 07/12/2013   Routine general medical examination at a health care facility 07/12/2013   PCP:  Allegra Grana, FNP Pharmacy:   [facility] - [place],  [place] - [address] [place] [address] Phone: [phone] Fax: [phone]  [place] [facility] [address] [place] [address] Phone: [phone] Fax: [phone]  [id number] [place], [place] - [address] [place] [address] Phone: [phone] Fax: [phone]     Social Drivers of Health (SDOH) Social History: SDOH Screenings   Food Insecurity: No Food Insecurity (11/05/2023)  Housing: Low Risk  (11/05/2023)  Transportation Needs: No Transportation Needs (11/05/2023)  Utilities: Not At Risk (11/05/2023)  Depression (PHQ2-9): Low Risk  (09/23/2023)  Tobacco Use: High Risk (11/04/2023)   SDOH Interventions:     Readmission Risk Interventions    10/30/2023    2:33 PM  Readmission Risk Prevention Plan  Transportation Screening Complete  PCP or Specialist Appt within 5-7 Days Complete  Home Care Screening Complete  Medication Review (RN CM) Complete

## 2023-11-07 NOTE — Plan of Care (Signed)
   Problem: Fluid Volume: Goal: Hemodynamic stability will improve Outcome: Progressing   Problem: Clinical Measurements: Goal: Diagnostic test results will improve Outcome: Progressing Goal: Signs and symptoms of infection will decrease Outcome: Progressing   Problem: Respiratory: Goal: Ability to maintain adequate ventilation will improve Outcome: Progressing

## 2023-11-07 NOTE — Progress Notes (Signed)
 Pharmacy Antibiotic Note  Judith Drake is a 53 y.o. female admitted on 11/04/2023 with sepsis.  Pharmacy has been consulted for Vancomycin dosing. 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 Presents with acute joint swelling, elevated WBC and low grade fever. Per ID, concern for left olecronon bursitis  Day 3 vanc and rocephin WBC trending down SCr stable Tmax 100.2 in last 24hr Cultures no growth to date  Plan: Rocephin per MD Continue vancomycin 1500mg  IV q24h to target AUC 400-550.  Estimated AUC on this regimen is 461. Monitor renal function and cx data  F/U ID recommendations  Height: 5\' 3"  (160 cm) Weight: 74.1 kg (163 lb 5.8 oz) IBW/kg (Calculated) : 52.4  Temp (24hrs), Avg:99 F (37.2 C), Min:98.3 F (36.8 C), Max:100.2 F (37.9 C)  Recent Labs  Lab 11/04/23 1827 11/05/23 0855 11/06/23 0438 11/07/23 0429  WBC 18.2* 17.1* 16.9* 14.8*  CREATININE 0.64 0.47 0.49 0.45  LATICACIDVEN 1.1  --   --   --     Estimated Creatinine Clearance: 80.2 mL/min (by C-G formula based on SCr of 0.45 mg/dL).    Allergies  Allergen Reactions   Amoxicillin Nausea And Vomiting    Has patient had a PCN reaction causing immediate rash, facial/tongue/throat swelling, SOB or lightheadedness with hypotension: No Has patient had a PCN reaction causing severe rash involving mucus membranes or skin necrosis: No Has patient had a PCN reaction that required hospitalization: No Has patient had a PCN reaction occurring within the last 10 years: No If all of the above answers are "NO", then may proceed with Cephalosporin use.    Dilaudid [Hydromorphone Hcl] Itching   Morphine And Codeine Other (See Comments)    Hallucinations     Antimicrobials this admission: 2/28 Rocephin >>  2/28 Vancomycin >>   Dose adjustments this admission:  Microbiology results: 2/27 BCx: ngtd 2/27 Resp PCR: negative  Previous cx: 2/21 Grp A PCR: POSITIVE  Hessie Knows, PharmD,  BCPS Secure Chat if ?s 11/07/2023 12:09 PM

## 2023-11-07 NOTE — Plan of Care (Signed)
 Pain continues to be primary challenge, however she received a dose of prednisone today and her pain has been slightly more manageable.

## 2023-11-07 NOTE — Progress Notes (Signed)
 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 Elevated rheumatoid factor in the past  Likely can follow up with rheumatology for further guidance  A course of prednisone would be ok  Not c/w disseminated gonococcal process -- urine gc/chlam is pending. Can keep on ceftriaxoone for now  Admission bcx ngtd   Prior left humerus orif. Mri elbow nonspecific tissue edema. But exam is concerning for worsening olecronon bursitis; she is exquisitely tender over that area  Mri right ankle also nonspecific tissue swelling  11/07/23 new bilateral knee pain/inflammatory feature per history   While all suggest an inflammatory connective process, I am a little worried about possiblity of infectious left olecronon bursitis. She has hardware on humerus there and mri shows no loosening. Pain/swelling/redness concentrated over the olecronon bursae  This area might need to be reviewed again by ortho and potentially needs bursa drainage   Given significant symptoms, and her being on antibiotics already, I think it's reasonable to give a dose of prednisone for today   PLAN: Continue vanc Continue ceftriaxone 40 mg po prednisone once- can see how she does tomorrow and discuss with rheum for a tapered course Please ask ortho again to review the olecronon bursae and access need for drainage and culture. It has been getting worse Tomorrow Dr Elinor Parkinson to resume care, can discuss with rheum outpatient about a prednisone course She can f/u rheum on discharge Standard isolation precaution Discuss with primary team  Principal Problem:   Sepsis (HCC) Active Problems:   Anxiety and depression   Polyarthropathy   Polyarthralgia    Fever   Allergies  Allergen Reactions   Amoxicillin Nausea And Vomiting    Has patient had a PCN reaction causing immediate rash, facial/tongue/throat swelling, SOB or lightheadedness with hypotension: No Has patient had a PCN reaction causing severe rash involving mucus membranes or skin necrosis: No Has patient had a PCN reaction that required hospitalization: No Has patient had a PCN reaction occurring within the last 10 years: No If all of the above answers are "NO", then may proceed with Cephalosporin use.    Dilaudid [Hydromorphone Hcl] Itching   Morphine And Codeine Other (See Comments)    Hallucinations     Scheduled Meds:  amLODipine  10 mg Oral Daily   DULoxetine  60 mg Oral Daily   enoxaparin (LOVENOX) injection  40 mg Subcutaneous Q24H   pantoprazole  40 mg Oral QAC breakfast   potassium chloride  40 mEq Oral Q4H   saccharomyces boulardii  250 mg Oral BID   sodium chloride flush  3 mL Intravenous Q12H   Continuous Infusions:  sodium chloride 100 mL/hr at 11/06/23 1836   cefTRIAXone (ROCEPHIN)  IV 2 g (11/07/23 0048)   vancomycin 1,500 mg (11/06/23 2117)   PRN Meds:.acetaminophen **OR** acetaminophen, ALPRAZolam, bisacodyl, HYDROcodone-acetaminophen, HYDROmorphone (DILAUDID) injection, ondansetron **OR** ondansetron (ZOFRAN) IV, senna-docusate   SUBJECTIVE: Pain still; new in knees; worse left elbow; stable wrists/mcp joints/ankles Low grade temp still despite abx Bcx ngtd No rash   Review of Systems: ROS All other ROS was negative, except mentioned above     OBJECTIVE: Vitals:   11/06/23 1955 11/06/23 2126 11/07/23 0515 11/07/23 0900  BP: 137/80 123/80 109/73 127/79  Pulse: (!) 102 (!)  114 91 91  Resp: 16 18 17 12   Temp: 98.3 F (36.8 C) 100.2 F (37.9 C) 98.9 F (37.2 C) 99 F (37.2 C)  TempSrc: Oral Tympanic Oral Oral  SpO2: 93% 97% 100% 97%  Weight:      Height:       Body mass index is 28.94 kg/m.  Physical  Exam General/constitutional: no distress, pleasant HEENT: Normocephalic, PER, Conj Clear, EOMI, Oropharynx clear Neck supple CV: rrr no mrg Lungs: clear to auscultation, normal respiratory effort Abd: Soft, Nontender Ext: no edema Skin: No Rash Neuro: nonfocal ZOX:WRUEAV on palpation bilateral ankle, knee, wrist/mcp joints, mild effusion on ankles wrists/mcp joints; rom intact there and elbows. There is significant swelling/redness/tenderness on left olecronon area   Lab Results Lab Results  Component Value Date   WBC 14.8 (H) 11/07/2023   HGB 12.5 11/07/2023   HCT 39.4 11/07/2023   MCV 99.0 11/07/2023   PLT 474 (H) 11/07/2023    Lab Results  Component Value Date   CREATININE 0.45 11/07/2023   BUN <5 (L) 11/07/2023   NA 137 11/07/2023   K 3.1 (L) 11/07/2023   CL 99 11/07/2023   CO2 27 11/07/2023    Lab Results  Component Value Date   ALT 53 (H) 11/07/2023   AST 76 (H) 11/07/2023   ALKPHOS 87 11/07/2023   BILITOT 0.5 11/07/2023      Microbiology: Recent Results (from the past 240 hours)  Resp panel by RT-PCR (RSV, Flu A&B, Covid) Anterior Nasal Swab     Status: None   Collection Time: 10/28/23  5:51 PM   Specimen: Anterior Nasal Swab  Result Value Ref Range Status   SARS Coronavirus 2 by RT PCR NEGATIVE NEGATIVE Final    Comment: (NOTE) SARS-CoV-2 target nucleic acids are NOT DETECTED.  The SARS-CoV-2 RNA is generally detectable in upper respiratory specimens during the acute phase of infection. The lowest concentration of SARS-CoV-2 viral copies this assay can detect is 138 copies/mL. A negative result does not preclude SARS-Cov-2 infection and should not be used as the sole basis for treatment or other patient management decisions. A negative result may occur with  improper specimen collection/handling, submission of specimen other than nasopharyngeal swab, presence of viral mutation(s) within the areas targeted by this assay, and inadequate number of  viral copies(<138 copies/mL). A negative result must be combined with clinical observations, patient history, and epidemiological information. The expected result is Negative.  Fact Sheet for Patients:  BloggerCourse.com  Fact Sheet for Healthcare Providers:  SeriousBroker.it  This test is no t yet approved or cleared by the [place] FDA and  has been authorized for detection and/or diagnosis of SARS-CoV-2 by FDA under an Emergency Use Authorization (EUA). This EUA will remain  in effect (meaning this test can be used) for the duration of the COVID-19 declaration under Section 564(b)(1) of the Act, 21 U.S.C.section 360bbb-3(b)(1), unless the authorization is terminated  or revoked sooner.       Influenza A by PCR NEGATIVE NEGATIVE Final   Influenza B by PCR NEGATIVE NEGATIVE Final    Comment: (NOTE) The Xpert Xpress SARS-CoV-2/FLU/RSV plus assay is intended as an aid in the diagnosis of influenza from Nasopharyngeal swab specimens and should not be used as a sole basis for treatment. Nasal washings and aspirates are unacceptable for Xpert Xpress SARS-CoV-2/FLU/RSV testing.  Fact Sheet for Patients: BloggerCourse.com  Fact Sheet for Healthcare Providers: SeriousBroker.it  This test is not yet approved or cleared by the [place] FDA and has been authorized for detection and/or diagnosis of SARS-CoV-2 by FDA under an Emergency Use Authorization (EUA). This EUA will remain in effect (meaning this test can be used) for the duration of the COVID-19 declaration under Section 564(b)(1) of the Act, 21 U.S.C. section 360bbb-3(b)(1), unless the authorization is terminated or revoked.     Resp Syncytial Virus by PCR NEGATIVE NEGATIVE Final    Comment: (NOTE) Fact Sheet for Patients: BloggerCourse.com  Fact Sheet for Healthcare  Providers: SeriousBroker.it  This test is not yet approved or cleared by the [place] FDA and has been authorized for detection and/or diagnosis of SARS-CoV-2 by FDA under an Emergency Use Authorization (EUA). This EUA will remain in effect (meaning this test can be used) for the duration of the COVID-19 declaration under Section 564(b)(1) of the Act, 21 U.S.C. section 360bbb-3(b)(1), unless the authorization is terminated or revoked.  Performed at [facility], [address], [place], [place] [address]   Culture, blood (Routine X 2) w Reflex to ID Panel     Status: None   Collection Time: 10/28/23 10:28 PM   Specimen: BLOOD RIGHT WRIST  Result Value Ref Range Status   Specimen Description   Final    BLOOD RIGHT WRIST Performed at [facility], [address] [place], [place] [address]    Special Requests   Final    BOTTLES DRAWN AEROBIC AND ANAEROBIC Blood Culture adequate volume Performed at [facility], [address], [place], [place] [address]    Culture   Final    NO GROWTH 5 DAYS Performed at [facility], [address], [place], [place] [address]    Report Status 11/03/2023 FINAL  Final  Culture, blood (Routine X 2) w Reflex to ID Panel     Status: None   Collection Time: 10/28/23 10:45 PM   Specimen: BLOOD  Result Value Ref Range Status   Specimen Description   Final    BLOOD BLOOD LEFT FOREARM Performed at [facility], [address], [place], [place] [address]    Special Requests   Final    BOTTLES DRAWN AEROBIC AND ANAEROBIC Blood Culture adequate volume Performed at [facility], [address] [place], [place] [address]    Culture   Final    NO GROWTH 5 DAYS Performed at [facility], [address], [place], [place] [address]    Report Status 11/03/2023 FINAL  Final  CSF culture w Gram Stain     Status: None    Collection Time: 10/29/23  9:31 AM   Specimen: Lumbar Puncture; Cerebrospinal Fluid  Result Value Ref Range Status   Specimen Description   Final    CSF Performed at [facility], [address], [place], [place] [address]    Special Requests   Final    NONE Performed at [facility], [address], [place], [place] [address]    Gram Stain   Final    NO WBC SEEN NO ORGANISMS SEEN Performed at [facility], [address] [place], [place] [address]    Culture   Final    NO GROWTH 3 DAYS Performed at [facility], [address], [place], [place] [address]    Report Status 11/02/2023 FINAL  Final  Respiratory (~20 pathogens) panel by PCR     Status: None   Collection Time: 10/29/23  1:11 PM  Specimen: Nasopharyngeal Swab; Respiratory  Result Value Ref Range Status   Adenovirus NOT DETECTED NOT DETECTED Final   Coronavirus 229E NOT DETECTED NOT DETECTED Final    Comment: (NOTE) The Coronavirus on the Respiratory Panel, DOES NOT test for the novel  Coronavirus (2019 nCoV)    Coronavirus HKU1 NOT DETECTED NOT DETECTED Final   Coronavirus NL63 NOT DETECTED NOT DETECTED Final   Coronavirus OC43 NOT DETECTED NOT DETECTED Final   Metapneumovirus NOT DETECTED NOT DETECTED Final   Rhinovirus / Enterovirus NOT DETECTED NOT DETECTED Final   Influenza A NOT DETECTED NOT DETECTED Final   Influenza B NOT DETECTED NOT DETECTED Final   Parainfluenza Virus 1 NOT DETECTED NOT DETECTED Final   Parainfluenza Virus 2 NOT DETECTED NOT DETECTED Final   Parainfluenza Virus 3 NOT DETECTED NOT DETECTED Final   Parainfluenza Virus 4 NOT DETECTED NOT DETECTED Final   Respiratory Syncytial Virus NOT DETECTED NOT DETECTED Final   Bordetella pertussis NOT DETECTED NOT DETECTED Final   Bordetella Parapertussis NOT DETECTED NOT DETECTED Final   Chlamydophila pneumoniae NOT DETECTED NOT DETECTED Final   Mycoplasma pneumoniae NOT DETECTED NOT DETECTED  Final    Comment: Performed at [facility], [address], [place], [place] [address]  Group A Strep by PCR     Status: Abnormal   Collection Time: 10/29/23  2:53 PM   Specimen: Throat; Sterile Swab  Result Value Ref Range Status   Group A Strep by PCR DETECTED (A) NOT DETECTED Final    Comment: Performed at [facility], [address], [place], [place] [address]  Resp panel by RT-PCR (RSV, Flu A&B, Covid) Anterior Nasal Swab     Status: None   Collection Time: 11/04/23  6:27 PM   Specimen: Anterior Nasal Swab  Result Value Ref Range Status   SARS Coronavirus 2 by RT PCR NEGATIVE NEGATIVE Final    Comment: (NOTE) SARS-CoV-2 target nucleic acids are NOT DETECTED.  The SARS-CoV-2 RNA is generally detectable in upper respiratory specimens during the acute phase of infection. The lowest concentration of SARS-CoV-2 viral copies this assay can detect is 138 copies/mL. A negative result does not preclude SARS-Cov-2 infection and should not be used as the sole basis for treatment or other patient management decisions. A negative result may occur with  improper specimen collection/handling, submission of specimen other than nasopharyngeal swab, presence of viral mutation(s) within the areas targeted by this assay, and inadequate number of viral copies(<138 copies/mL). A negative result must be combined with clinical observations, patient history, and epidemiological information. The expected result is Negative.  Fact Sheet for Patients:  BloggerCourse.com  Fact Sheet for Healthcare Providers:  SeriousBroker.it  This test is no t yet approved or cleared by the [place] FDA and  has been authorized for detection and/or diagnosis of SARS-CoV-2 by FDA under an Emergency Use Authorization (EUA). This EUA will remain  in effect (meaning this test can be used) for the duration of the COVID-19 declaration under  Section 564(b)(1) of the Act, 21 U.S.C.section 360bbb-3(b)(1), unless the authorization is terminated  or revoked sooner.       Influenza A by PCR NEGATIVE NEGATIVE Final   Influenza B by PCR NEGATIVE NEGATIVE Final    Comment: (NOTE) The Xpert Xpress SARS-CoV-2/FLU/RSV plus assay is intended as an aid in the diagnosis of influenza from Nasopharyngeal swab specimens and should not be used as a sole basis for treatment. Nasal washings and aspirates are unacceptable for  Xpert Xpress SARS-CoV-2/FLU/RSV testing.  Fact Sheet for Patients: BloggerCourse.com  Fact Sheet for Healthcare Providers: SeriousBroker.it  This test is not yet approved or cleared by the [place] FDA and has been authorized for detection and/or diagnosis of SARS-CoV-2 by FDA under an Emergency Use Authorization (EUA). This EUA will remain in effect (meaning this test can be used) for the duration of the COVID-19 declaration under Section 564(b)(1) of the Act, 21 U.S.C. section 360bbb-3(b)(1), unless the authorization is terminated or revoked.     Resp Syncytial Virus by PCR NEGATIVE NEGATIVE Final    Comment: (NOTE) Fact Sheet for Patients: BloggerCourse.com  Fact Sheet for Healthcare Providers: SeriousBroker.it  This test is not yet approved or cleared by the [place] FDA and has been authorized for detection and/or diagnosis of SARS-CoV-2 by FDA under an Emergency Use Authorization (EUA). This EUA will remain in effect (meaning this test can be used) for the duration of the COVID-19 declaration under Section 564(b)(1) of the Act, 21 U.S.C. section 360bbb-3(b)(1), unless the authorization is terminated or revoked.  Performed at [facility], [address] [place], [place] [address]   Respiratory (~20 pathogens) panel by PCR     Status: None   Collection Time: 11/04/23  6:27  PM   Specimen: Nasopharyngeal Swab; Respiratory  Result Value Ref Range Status   Adenovirus NOT DETECTED NOT DETECTED Final   Coronavirus 229E NOT DETECTED NOT DETECTED Final    Comment: (NOTE) The Coronavirus on the Respiratory Panel, DOES NOT test for the novel  Coronavirus (2019 nCoV)    Coronavirus HKU1 NOT DETECTED NOT DETECTED Final   Coronavirus NL63 NOT DETECTED NOT DETECTED Final   Coronavirus OC43 NOT DETECTED NOT DETECTED Final   Metapneumovirus NOT DETECTED NOT DETECTED Final   Rhinovirus / Enterovirus NOT DETECTED NOT DETECTED Final   Influenza A NOT DETECTED NOT DETECTED Final   Influenza B NOT DETECTED NOT DETECTED Final   Parainfluenza Virus 1 NOT DETECTED NOT DETECTED Final   Parainfluenza Virus 2 NOT DETECTED NOT DETECTED Final   Parainfluenza Virus 3 NOT DETECTED NOT DETECTED Final   Parainfluenza Virus 4 NOT DETECTED NOT DETECTED Final   Respiratory Syncytial Virus NOT DETECTED NOT DETECTED Final   Bordetella pertussis NOT DETECTED NOT DETECTED Final   Bordetella Parapertussis NOT DETECTED NOT DETECTED Final   Chlamydophila pneumoniae NOT DETECTED NOT DETECTED Final   Mycoplasma pneumoniae NOT DETECTED NOT DETECTED Final    Comment: Performed at [facility], [address] [place], [place] [address]  Blood culture (routine x 2)     Status: None (Preliminary result)   Collection Time: 11/04/23 10:26 PM   Specimen: BLOOD  Result Value Ref Range Status   Specimen Description   Final    BLOOD Performed at [facility], [address], [place], [place] [address]    Special Requests   Final    BOTTLES DRAWN AEROBIC AND ANAEROBIC Blood Culture adequate volume Performed at [facility], [address], [place], [place] [address]    Culture   Final    NO GROWTH 2 DAYS Performed at [facility], [address] [place], [place] [address]    Report Status PENDING  Incomplete  Blood culture (routine x 2)      Status: None (Preliminary result)   Collection Time: 11/05/23  8:55 AM   Specimen: BLOOD  Result Value Ref Range Status   Specimen Description   Final  BLOOD BLOOD LEFT HAND Performed at [facility], [address] [place], [place] [address]    Special Requests   Final    BOTTLES DRAWN AEROBIC AND ANAEROBIC Blood Culture adequate volume Performed at [facility], [address], [place], [place] [address]    Culture   Final    NO GROWTH 2 DAYS Performed at [facility], [address] [place], [place] [address]    Report Status PENDING  Incomplete     Serology:   Imaging: If present, new imagings (plain films, ct scans, and mri) have been personally visualized and interpreted; radiology reports have been reviewed. Decision making incorporated into the Impression / Recommendations.  3/1 mri right elbow 1. Diffuse subcutaneous edema surrounding the elbow, greatest posteriorly, with ill-defined fluid posterior to the olecranon process, likely indicating olecranon bursitis. No organized or drainable fluid collections are identified. 2. Postsurgical changes from previous distal humeral screw fixation with associated susceptibility artifact. No evidence of acute fracture, dislocation or osteomyelitis. 3. Advanced degenerative changes in the elbow with small joint effusion and intra-articular loose bodies. These findings may contribute to decreased range of motion. Consider CT for further evaluation. 4. The biceps tendon appears intact. The ulnar collateral ligament appears diffusely attenuated, possibly from previous injury.  2/28 mri right ankle IMPRESSION 1. Nonspecific lateral hindfoot soft tissue swelling. No evidence of septic arthritis or osteomyelitis. 2. Old trimalleolar fracture status post distal fibula ORIF. 3. Mild posttraumatic tibiotalar osteoarthritis.  Raymondo Band, MD [facility] [phone] pager    11/07/2023, 10:29 AM

## 2023-11-08 ENCOUNTER — Telehealth: Payer: Self-pay

## 2023-11-08 DIAGNOSIS — F32A Depression, unspecified: Secondary | ICD-10-CM | POA: Diagnosis not present

## 2023-11-08 DIAGNOSIS — R7401 Elevation of levels of liver transaminase levels: Secondary | ICD-10-CM

## 2023-11-08 DIAGNOSIS — F509 Eating disorder, unspecified: Secondary | ICD-10-CM

## 2023-11-08 DIAGNOSIS — M7022 Olecranon bursitis, left elbow: Secondary | ICD-10-CM | POA: Diagnosis not present

## 2023-11-08 DIAGNOSIS — M2550 Pain in unspecified joint: Secondary | ICD-10-CM | POA: Diagnosis not present

## 2023-11-08 DIAGNOSIS — M130 Polyarthritis, unspecified: Secondary | ICD-10-CM | POA: Diagnosis not present

## 2023-11-08 DIAGNOSIS — R509 Fever, unspecified: Secondary | ICD-10-CM | POA: Diagnosis not present

## 2023-11-08 DIAGNOSIS — F419 Anxiety disorder, unspecified: Secondary | ICD-10-CM | POA: Diagnosis not present

## 2023-11-08 DIAGNOSIS — E876 Hypokalemia: Secondary | ICD-10-CM

## 2023-11-08 DIAGNOSIS — R21 Rash and other nonspecific skin eruption: Secondary | ICD-10-CM | POA: Diagnosis not present

## 2023-11-08 LAB — CBC
HCT: 42.5 % (ref 36.0–46.0)
Hemoglobin: 13.4 g/dL (ref 12.0–15.0)
MCH: 31.2 pg (ref 26.0–34.0)
MCHC: 31.5 g/dL (ref 30.0–36.0)
MCV: 98.8 fL (ref 80.0–100.0)
Platelets: 600 10*3/uL — ABNORMAL HIGH (ref 150–400)
RBC: 4.3 MIL/uL (ref 3.87–5.11)
RDW: 14.1 % (ref 11.5–15.5)
WBC: 11.6 10*3/uL — ABNORMAL HIGH (ref 4.0–10.5)
nRBC: 0 % (ref 0.0–0.2)

## 2023-11-08 LAB — COMPREHENSIVE METABOLIC PANEL
ALT: 46 U/L — ABNORMAL HIGH (ref 0–44)
AST: 48 U/L — ABNORMAL HIGH (ref 15–41)
Albumin: 3.5 g/dL (ref 3.5–5.0)
Alkaline Phosphatase: 90 U/L (ref 38–126)
Anion gap: 14 (ref 5–15)
BUN: 7 mg/dL (ref 6–20)
CO2: 25 mmol/L (ref 22–32)
Calcium: 9.2 mg/dL (ref 8.9–10.3)
Chloride: 100 mmol/L (ref 98–111)
Creatinine, Ser: 0.42 mg/dL — ABNORMAL LOW (ref 0.44–1.00)
GFR, Estimated: >60 mL/min (ref >60)
Glucose, Bld: 135 mg/dL — ABNORMAL HIGH (ref 70–99)
Potassium: 3.3 mmol/L — ABNORMAL LOW (ref 3.5–5.1)
Sodium: 139 mmol/L (ref 135–145)
Total Bilirubin: 0.7 mg/dL (ref 0.0–1.2)
Total Protein: 8.1 g/dL (ref 6.5–8.1)

## 2023-11-08 MED ORDER — POTASSIUM CHLORIDE CRYS ER 20 MEQ PO TBCR
40.0000 meq | EXTENDED_RELEASE_TABLET | Freq: Once | ORAL | Status: AC
  Administered 2023-11-08: 40 meq via ORAL
  Filled 2023-11-08: qty 2

## 2023-11-08 MED ORDER — ORAL CARE MOUTH RINSE: 15.0000 mL | OROMUCOSAL | Status: DC | PRN

## 2023-11-08 MED ORDER — KETOROLAC TROMETHAMINE 30 MG/ML IJ SOLN
30.0000 mg | Freq: Once | INTRAMUSCULAR | Status: AC
  Administered 2023-11-08: 30 mg via INTRAVENOUS
  Filled 2023-11-08: qty 1

## 2023-11-08 NOTE — Telephone Encounter (Signed)
 Judith Drake would like a call back concerning patient.  CB# [phone].  Please advise.  Thank you

## 2023-11-08 NOTE — Progress Notes (Signed)
 [facility]  PROGRESS NOTE  Judith Drake HWE:[id number] DOB: 1971-08-11 DOA: 11/04/2023 PCP: Judith Grana, FNP   Brief HPI:   53 year old female with history of hypertension, depression/anxiety, alcohol use, recent diagnosis of syphilis and completed  4 weeks of doxycycline, recent admission for sepsis due to streptococcal pharyngitis s/p treatment with ceftriaxone who presented to the emergency department with complaint of polyarthropathy, fever, chills.  Developed pain and swelling in multiple joints  about 3 days ago.Also developed petechial rash involving her extremities.  On presentation, she was hemodynamically stable.  Labs showed leukocytosis of 18.2, potassium of 3, elevated ESR/CRP.  ID consulted, plan for testing GC/chlamydia.  Started on empiric vancomycin, ceftriaxone.  Culture sent     Assessment/Plan:   Symmetrical polyarthropathy/sepsis: -Presented with leukocytosis, tachycardia, fever, polyarthralgia -She had recent treatment for syphilis and group A strep pharyngitis -ID and orthopedics was consulted.  Patient was started on Rocephin and vancomycin -GC/chlamydia urine obtained, result is currently pending -Patient does have history of inflammatory arthritis, has been seen by rheumatologist as outpatient -She was on prednisone at 1 point with significant improvement however because of the insurance issues she could not be on prednisone.  She was started on hydroxychloroquine however patient does not want to take that. -MRI of left elbow is currently pending -Discussed with ID, patient will likely benefit from being on steroids as this is not likely an infectious process. -She received 1 dose of prednisone 40 mg p.o. and 1 dose of Toradol 30 mg IV with some improvement in pain and swelling -Will give additional Toradol 30 mg IV today; hold prednisone till reevaluation done by orthopedics.  She might need to have left elbow aspirated   History of right ankle  fracture:  X-ray showed ORIF of prior healed distal fibula fracture. No evidence of orthopedic hardware failure or loosening. Diffuse soft tissue swelling greatest within the anterior and lateral ankle.  Orthopedics also consulted.  No plan for any intervention  History of left elbow injury, she has hardware in left elbow.  X-ray of the left elbow does not show any loosening of hardware.  No intervention recommended per orthopedics. -MRI of left elbow did not show osteomyelitis or drainable abscess -As per ID, Ortho will reevaluate for possible drainage of bursa from left elbow.   History of syphilis:  Recently completed 28 days treatment with doxycycline.  Started on Florastor   Hypokalemia:  -Potassium is 3.3 today -Will replace potassium and follow BMP in am   Hypertension: Continue amlodipine   Depression/anxiety: Continue Cymbalta  Transaminitis -Unclear etiology -Improving       amLODipine  10 mg Oral Daily   DULoxetine  60 mg Oral Daily   enoxaparin (LOVENOX) injection  40 mg Subcutaneous Q24H   ketorolac  30 mg Intravenous Once   pantoprazole  40 mg Oral QAC breakfast   potassium chloride  40 mEq Oral Once   saccharomyces boulardii  250 mg Oral BID   sodium chloride flush  3 mL Intravenous Q12H     Data Reviewed:   CBG:  No results for input(s): "GLUCAP" in the last 168 hours.  SpO2: 96 %    Vitals:   11/07/23 2052 11/08/23 0448 11/08/23 0848 11/08/23 1214  BP: 118/74 124/76 117/66 104/85  Pulse:  84 78 92  Resp: 18 20 16 18   Temp: 98 F (36.7 C) 98 F (36.7 C) 97.6 F (36.4 C) 98 F (36.7 C)  TempSrc: Oral Oral Oral Oral  SpO2:  95% 97% 97% 96%  Weight:      Height:          Data Reviewed:  Basic Metabolic Panel: Recent Labs  Lab 11/04/23 1827 11/05/23 0855 11/06/23 0438 11/07/23 0429 11/08/23 1043  NA 133* 131* 139 137 139  K 3.0* 3.3* 3.5 3.1* 3.3*  CL 92* 94* 99 99 100  CO2 25 25 26 27 25   GLUCOSE 121* 107* 88 97 135*  BUN 9 8  5* <5* 7  CREATININE 0.64 0.47 0.49 0.45 0.42*  CALCIUM 9.8 9.0 9.2 8.6* 9.2    CBC: Recent Labs  Lab 11/04/23 1827 11/05/23 0855 11/06/23 0438 11/07/23 0429 11/08/23 1043  WBC 18.2* 17.1* 16.9* 14.8* 11.6*  NEUTROABS 14.9*  --   --   --   --   HGB 15.7* 13.0 13.6 12.5 13.4  HCT 47.1* 37.7 42.7 39.4 42.5  MCV 94.6 94.5 100.2* 99.0 98.8  PLT 516* 435* 487* 474* 600*    LFT Recent Labs  Lab 11/04/23 2226 11/05/23 0855 11/07/23 0429 11/08/23 1043  AST 50* 45* 76* 48*  ALT 50* 41 53* 46*  ALKPHOS 79 73 87 90  BILITOT 1.2 1.2 0.5 0.7  PROT 8.6* 7.7 7.4 8.1  ALBUMIN 3.9 3.6 3.1* 3.5     Antibiotics: Anti-infectives (From admission, onward)    Start     Dose/Rate Route Frequency Ordered Stop   11/07/23 2200  vancomycin (VANCOREADY) IVPB 1500 mg/300 mL        1,500 mg 150 mL/hr over 120 Minutes Intravenous Every 24 hours 11/07/23 1105     11/05/23 2300  cefTRIAXone (ROCEPHIN) 2 g in sodium chloride 0.9 % 100 mL IVPB        2 g 200 mL/hr over 30 Minutes Intravenous Every 24 hours 11/05/23 0125     11/05/23 2200  vancomycin (VANCOREADY) IVPB 1500 mg/300 mL  Status:  Discontinued        1,500 mg 150 mL/hr over 120 Minutes Intravenous Every 24 hours 11/04/23 2352 11/07/23 1036   11/04/23 2315  vancomycin (VANCOREADY) IVPB 1500 mg/300 mL        1,500 mg 150 mL/hr over 120 Minutes Intravenous  Once 11/04/23 2302 11/05/23 0522   11/04/23 2300  cefTRIAXone (ROCEPHIN) 2 g in sodium chloride 0.9 % 100 mL IVPB        2 g 200 mL/hr over 30 Minutes Intravenous  Once 11/04/23 2247 11/05/23 0100        DVT prophylaxis: Enoxaparin  Code Status: Full code  Family Communication: Discussed with patient husband at bedside   CONSULTS infectious disease, orthopedics   Subjective   Left elbow swelling improved after she received Toradol and prednisone yesterday.   Objective    Physical Examination:  General-appears in no acute distress Heart-S1-S2, regular, no murmur  auscultated Lungs-clear to auscultation bilaterally, no wheezing or crackles auscultated Abdomen-soft, nontender, no organomegaly Extremities-left elbow edematous, mild erythema, tender to palpation Neuro-alert, oriented x3, no focal deficit noted  Status is: Inpatient:             [name]   [facility] If 7PM-7AM, please contact night-coverage at [email or web address], Office  [phone]   11/08/2023, 2:05 PM  LOS: 3 days

## 2023-11-08 NOTE — Progress Notes (Addendum)
 [facility] Infectious Diseases Follow Up Note  Patient Identification: Patient Name: Judith Drake MRN: [id number] Admit Date: 11/04/2023  5:54 PM Age: 53 y.o.Today's Date: 11/08/2023  Reason for Visit: Polyarthralgia, olecranon bursitis  Principal Problem:   Sepsis (HCC) Active Problems:   Anxiety and depression   Polyarthritis   Polyarthralgia   Fever  Antibiotics:  Vancomycin 2/27-c Ceftriaxone 2/27-c   Lines/Hardware:  Interval Events: Tmax 100.2 on 3/1 but otherwise remained afebrile over the weekend.  Labs today remarkable for K3.1, AST 76, ALT 53.  WBC down to 14.8.  MRI left elbow concerning for olecranon bursitis.    Assessment 53 year old female with PMH as below including HTN, GERD, anxiety/depression, alcohol use, Fatty liver s/p right ankle ORIF, Inflammatory arthritis, Syphilis s/p 28 days of doxycycline and recent admission February 20-22 for strep pharyngitis( treated with ceftriaxone> cefadroxil) who presented to the ED on 2/27 with polyarthralgia, fevers, chills and generalized weakness.   # Fevers, polyarthralgia, petechial rash # Recent h/p Group A strep pharyngitis  # Recent h/o syphilis s/p tx  # H/o inflammatory arthritis, lost to fu with Rheum and prior h/o improvement with prednisone/steroid  11/04/23 Urine GC negative 11/05/23 HIV NR 10/12/23 Hep B surface ag negative RPR 1: 128> 1: 64 One dose of 40mg  prednisone yesterday with improvement in pain and ROM of b/l shoulder and neck. Left elbow feels more or less the same per patient. However Nursing note mentions pain more manageable after dose of prednisone. Dr Sharl Ma reports some improvement in pain and swelling in left elbow today compared to weekend  # Hypokalemia - being repleted  # Mild transaminitis - will monitor   Recommendations - continue Vancomycin, pharmacy to dose and ceftriaxone pending Ortho re-eval esp left elbow, ? Needs bursal  excision. I have called and left message to Dr Kathi Der office.  - Discuss with Rheumatology by phone regarding +/- additional continuation of steroids. Will defer this to primary team - Monitor CBC and CMP on abtx - Fu HCV serology - Universal/standard isolation precautions  - d/w Dr Sharl Ma   Rest of the management as per the primary team. Thank you for the consult. Please page with pertinent questions or concerns.  ______________________________________________________________________ Subjective patient seen and examined at the bedside. She reports some improvement in pain in her bilateral shoulders, she is able to lift her shoulders without difficulty.  Neck mobility is better. Reports 2 knots in her left knee and one in the rt forearm. Pain and swelling in her left elbow with not much improvement, feels like same.  She continues have pain in her bilateral knees and bilateral ankles, not so much in the bilateral hips.  Vitals BP 124/76 (BP Location: Right Arm)   Pulse 84   Temp 98 F (36.7 C) (Oral)   Resp 20   Ht 5\' 3"  (1.6 m)   Wt 74.1 kg   LMP  (LMP Unknown)   SpO2 97%   BMI 28.94 kg/m     Physical Exam Constitutional: Adult female lying in the bed, she just woke up from sleeping    Comments: HEENT WNL  Cardiovascular:     Rate and Rhythm: Normal rate and regular rhythm.     Heart sounds:   Pulmonary:     Effort: Pulmonary effort is normal.     Comments:   Abdominal:     Palpations: Abdomen is soft.     Tenderness:   Musculoskeletal:        General:  Swelling,  tenderness, redness in her left elbow, she has reasonable range of motion during active and passive movement.   She has improved range of motion in her bilateral shoulders, right elbow with no obvious redness, swelling or warmth  Still has pain during range of motion of bilateral knees as well as bilateral ankles but no obvious redness, swelling, tenderness or warmth  Skin:    Comments: Petechial rashes in  the distal lower extremity seems to have improved  Neurological:     General: Awake, alert and oriented, grossly nonfocal  Psychiatric:        Mood and Affect: Mood normal.   Pertinent Microbiology    Latest Ref Rng & Units 11/07/2023    4:29 AM 11/06/2023    4:38 AM 11/05/2023    8:55 AM  CBC  WBC 4.0 - 10.5 K/uL 14.8  16.9  17.1   Hemoglobin 12.0 - 15.0 g/dL 40.9  81.1  91.4   Hematocrit 36.0 - 46.0 % 39.4  42.7  37.7   Platelets 150 - 400 K/uL 474  487  435       Latest Ref Rng & Units 11/07/2023    4:29 AM 11/06/2023    4:38 AM 11/05/2023    8:55 AM  CMP  Glucose 70 - 99 mg/dL 97  88  782   BUN 6 - 20 mg/dL 5  5  8    Creatinine 0.44 - 1.00 mg/dL 9.56  2.13  0.86   Sodium 135 - 145 mmol/L 137  139  131   Potassium 3.5 - 5.1 mmol/L 3.1  3.5  3.3   Chloride 98 - 111 mmol/L 99  99  94   CO2 22 - 32 mmol/L 27  26  25    Calcium 8.9 - 10.3 mg/dL 8.6  9.2  9.0   Total Protein 6.5 - 8.1 g/dL 7.4   7.7   Total Bilirubin 0.0 - 1.2 mg/dL 0.5   1.2   Alkaline Phos 38 - 126 U/L 87   73   AST 15 - 41 U/L 76   45   ALT 0 - 44 U/L 53   41    Pertinent Lab.    Latest Ref Rng & Units 11/07/2023    4:29 AM 11/06/2023    4:38 AM 11/05/2023    8:55 AM  CBC  WBC 4.0 - 10.5 K/uL 14.8  16.9  17.1   Hemoglobin 12.0 - 15.0 g/dL 57.8  46.9  62.9   Hematocrit 36.0 - 46.0 % 39.4  42.7  37.7   Platelets 150 - 400 K/uL 474  487  435       Latest Ref Rng & Units 11/07/2023    4:29 AM 11/06/2023    4:38 AM 11/05/2023    8:55 AM  CMP  Glucose 70 - 99 mg/dL 97  88  528   BUN 6 - 20 mg/dL 5  5  8    Creatinine 0.44 - 1.00 mg/dL 4.13  2.44  0.10   Sodium 135 - 145 mmol/L 137  139  131   Potassium 3.5 - 5.1 mmol/L 3.1  3.5  3.3   Chloride 98 - 111 mmol/L 99  99  94   CO2 22 - 32 mmol/L 27  26  25    Calcium 8.9 - 10.3 mg/dL 8.6  9.2  9.0   Total Protein 6.5 - 8.1 g/dL 7.4   7.7   Total Bilirubin 0.0 - 1.2 mg/dL 0.5   1.2  Alkaline Phos 38 - 126 U/L 87   73   AST 15 - 41 U/L 76   45   ALT 0 - 44 U/L  53   41    Pertinent Imaging today Plain films and CT images have been personally visualized and interpreted; radiology reports have been reviewed. Decision making incorporated into the Impression /   MR ELBOW LEFT W WO CONTRAST Result Date: 11/07/2023 CLINICAL DATA:  Fevers, polyarthralgia, pain and swelling in the left elbow. Limited mobility. Previous surgery. EXAM: MRI OF THE LEFT ELBOW WITHOUT AND WITH CONTRAST TECHNIQUE: Multiplanar, multisequence MR imaging of the elbow was performed before and after the administration of intravenous contrast. CONTRAST:  7mL GADAVIST GADOBUTROL 1 MMOL/ML IV SOLN COMPARISON:  Radiographs 11/04/2023. FINDINGS: Technical note: Despite efforts by the technologist and patient, mild motion artifact is present on today's exam and could not be eliminated. This reduces exam sensitivity and specificity. Susceptibility artifact from previous distal humeral screw fixation also limits image quality. TENDONS Common forearm flexor origin: Partially obscured by artifact. Grossly intact. Common forearm extensor origin: Partially obscured by artifact. Grossly intact. Biceps: Intact. Triceps: Intact with normal signal. LIGAMENTS Medial stabilizers: The ulnar collateral ligament appears diffusely attenuated, possibly from previous injury. Lateral stabilizers: Partially obscured by artifact. Grossly intact radial and lateral ulnar collateral ligaments. Cartilage: As demonstrated radiographically, there are advanced ulnohumeral and radiocapitellar degenerative changes with chondral thinning and osteophytes. Mild subchondral cyst formation in the coronoid process. Joint: Small joint effusion without suspicious synovial enhancement. Evidence for intra-articular loose bodies, including a 1.2 cm loose body anteriorly in the coronoid fossa. Cubital tunnel: Unremarkable.  The ulnar nerve appears normal. Bones: As above, postsurgical changes from previous distal humeral screw fixation with  associated susceptibility artifact. No evidence of acute fracture, dislocation or osteomyelitis. Advanced degenerative changes are noted. Other: Diffuse subcutaneous edema surrounding the elbow, greatest posteriorly. Postcontrast images demonstrate heterogeneous subcutaneous enhancement posteriorly with ill-defined fluid posterior to the olecranon process, likely indicating olecranon bursitis. Overall, this fluid measures approximately 1.9 x 1.4 x 0.4 cm. No organized or drainable fluid collections are identified. Probable reactive epitrochlear lymph nodes. IMPRESSION: 1. Diffuse subcutaneous edema surrounding the elbow, greatest posteriorly, with ill-defined fluid posterior to the olecranon process, likely indicating olecranon bursitis. No organized or drainable fluid collections are identified. 2. Postsurgical changes from previous distal humeral screw fixation with associated susceptibility artifact. No evidence of acute fracture, dislocation or osteomyelitis. 3. Advanced degenerative changes in the elbow with small joint effusion and intra-articular loose bodies. These findings may contribute to decreased range of motion. Consider CT for further evaluation. 4. The biceps tendon appears intact. The ulnar collateral ligament appears diffusely attenuated, possibly from previous injury. Electronically Signed   By: Carey Bullocks M.D.   On: 11/07/2023 09:13   I have personally spent 50 minutes involved in face-to-face and non-face-to-face activities for this patient on the day of the visit. Professional time spent includes the following activities: Preparing to see the patient (review of tests), Obtaining and/or reviewing separately obtained history (admission/discharge record), Performing a medically appropriate examination and/or evaluation , Ordering medications/tests/procedures, referring and communicating with other health care professionals, Documenting clinical information in the EMR, Independently  interpreting results (not separately reported), Communicating results to the patient/family/caregiver, Counseling and educating the patient/family/caregiver and Care coordination (not separately reported).   Plan d/w requesting provider as well as ID pharm D  Of note, portions of this note may have been created with voice recognition software. While this note  has been edited for accuracy, occasional wrong-word or 'sound-a-like' substitutions may have occurred due to the inherent limitations of voice recognition software.   Electronically signed by:   Odette Fraction, MD Infectious Disease Physician [facility] Pager: [phone]

## 2023-11-09 DIAGNOSIS — M2550 Pain in unspecified joint: Secondary | ICD-10-CM | POA: Diagnosis not present

## 2023-11-09 DIAGNOSIS — M7022 Olecranon bursitis, left elbow: Secondary | ICD-10-CM | POA: Diagnosis not present

## 2023-11-09 DIAGNOSIS — R21 Rash and other nonspecific skin eruption: Secondary | ICD-10-CM | POA: Diagnosis not present

## 2023-11-09 DIAGNOSIS — R509 Fever, unspecified: Secondary | ICD-10-CM | POA: Diagnosis not present

## 2023-11-09 DIAGNOSIS — F509 Eating disorder, unspecified: Secondary | ICD-10-CM | POA: Diagnosis not present

## 2023-11-09 LAB — COMPREHENSIVE METABOLIC PANEL
ALT: 54 U/L — ABNORMAL HIGH (ref 0–44)
AST: 77 U/L — ABNORMAL HIGH (ref 15–41)
Albumin: 3.2 g/dL — ABNORMAL LOW (ref 3.5–5.0)
Alkaline Phosphatase: 83 U/L (ref 38–126)
Anion gap: 10 (ref 5–15)
BUN: 9 mg/dL (ref 6–20)
CO2: 26 mmol/L (ref 22–32)
Calcium: 9.1 mg/dL (ref 8.9–10.3)
Chloride: 102 mmol/L (ref 98–111)
Creatinine, Ser: 0.4 mg/dL — ABNORMAL LOW (ref 0.44–1.00)
GFR, Estimated: >60 mL/min (ref >60)
Glucose, Bld: 91 mg/dL (ref 70–99)
Potassium: 3.5 mmol/L (ref 3.5–5.1)
Sodium: 138 mmol/L (ref 135–145)
Total Bilirubin: 0.7 mg/dL (ref 0.0–1.2)
Total Protein: 7.8 g/dL (ref 6.5–8.1)

## 2023-11-09 LAB — HCV INTERPRETATION

## 2023-11-09 LAB — CBC
HCT: 41 % (ref 36.0–46.0)
Hemoglobin: 12.7 g/dL (ref 12.0–15.0)
MCH: 31.4 pg (ref 26.0–34.0)
MCHC: 31 g/dL (ref 30.0–36.0)
MCV: 101.2 fL — ABNORMAL HIGH (ref 80.0–100.0)
Platelets: 558 10*3/uL — ABNORMAL HIGH (ref 150–400)
RBC: 4.05 MIL/uL (ref 3.87–5.11)
RDW: 14.4 % (ref 11.5–15.5)
WBC: 6.9 10*3/uL (ref 4.0–10.5)
nRBC: 0 % (ref 0.0–0.2)

## 2023-11-09 LAB — HCV AB W REFLEX TO QUANT PCR: HCV Ab: NONREACTIVE

## 2023-11-09 MED ORDER — PREDNISONE 20 MG PO TABS: 20.0000 mg | ORAL_TABLET | Freq: Every day | ORAL | Status: DC

## 2023-11-09 MED ORDER — PREDNISONE 50 MG PO TABS
50.0000 mg | ORAL_TABLET | Freq: Every day | ORAL | Status: AC
  Administered 2023-11-09: 50 mg via ORAL
  Filled 2023-11-09: qty 1

## 2023-11-09 MED ORDER — PREDNISONE 20 MG PO TABS
40.0000 mg | ORAL_TABLET | Freq: Every day | ORAL | Status: AC
  Administered 2023-11-10: 40 mg via ORAL
  Filled 2023-11-09: qty 2

## 2023-11-09 MED ORDER — PREDNISONE 10 MG PO TABS: 10.0000 mg | ORAL_TABLET | Freq: Every day | ORAL | Status: DC

## 2023-11-09 MED ORDER — PREDNISONE 20 MG PO TABS: 30.0000 mg | ORAL_TABLET | Freq: Every day | ORAL | Status: DC

## 2023-11-09 NOTE — Progress Notes (Signed)
 Orthopedic Note  MRI of the left elbow reviewed which shows increased T2 signal within the posterior elbow near the olecranon bursa. There is no discrete fluid collection amenable to aspiration or I&D. Okay to give prednisone. Her presentation seems more consistent with inflammatory or rheumatologic process. Very unlikely to have multiple joints with septic arthritis especially without negative blood cultures. Will round on patient to reevaluate once she has had prednisone.   London Sheer, MD Orthopedic Surgeon

## 2023-11-09 NOTE — Plan of Care (Signed)
  Problem: Clinical Measurements: Goal: Diagnostic test results will improve Outcome: Progressing Goal: Signs and symptoms of infection will decrease Outcome: Progressing   Problem: Education: Goal: Knowledge of General Education information will improve Description: Including pain rating scale, medication(s)/side effects and non-pharmacologic comfort measures Outcome: Progressing   Problem: Health Behavior/Discharge Planning: Goal: Ability to manage health-related needs will improve Outcome: Progressing   Problem: Clinical Measurements: Goal: Ability to maintain clinical measurements within normal limits will improve Outcome: Progressing Goal: Will remain free from infection Outcome: Progressing Goal: Diagnostic test results will improve Outcome: Progressing Goal: Cardiovascular complication will be avoided Outcome: Progressing   Problem: Activity: Goal: Risk for activity intolerance will decrease Outcome: Progressing   Problem: Nutrition: Goal: Adequate nutrition will be maintained Outcome: Progressing   Problem: Coping: Goal: Level of anxiety will decrease Outcome: Progressing   Problem: Elimination: Goal: Will not experience complications related to bowel motility Outcome: Progressing Goal: Will not experience complications related to urinary retention Outcome: Progressing   Problem: Pain Managment: Goal: General experience of comfort will improve and/or be controlled Outcome: Progressing   Problem: Safety: Goal: Ability to remain free from injury will improve Outcome: Progressing   Problem: Skin Integrity: Goal: Risk for impaired skin integrity will decrease Outcome: Progressing

## 2023-11-09 NOTE — Progress Notes (Addendum)
 [facility] Infectious Diseases Follow Up Note  Patient Identification: Patient Name: Judith Drake MRN: [id number] Admit Date: 11/04/2023  5:54 PM Age: 53 y.o.Today's Date: 11/09/2023  Reason for Visit: Polyarthralgia, olecranon bursitis  Principal Problem:   Sepsis (HCC) Active Problems:   Anxiety and depression   Polyarthritis   Polyarthralgia   Fever   Olecranon bursitis of left elbow  Antibiotics:  Vancomycin 2/27-c Ceftriaxone 2/27-c   Lines/Hardware:  Interval Events: afebrile, WBC gradually decreasing to normal. Labs with mildly elevated liver enzymes.    Assessment 53 year old female with PMH as below including HTN, GERD, anxiety/depression, alcohol use, Fatty liver s/p right ankle ORIF, Inflammatory arthritis, Syphilis s/p 28 days of doxycycline and recent admission February 20-22 for strep pharyngitis( treated with ceftriaxone> cefadroxil) who presented to the ED on 2/27 with polyarthralgia, fevers, chills and generalized weakness.   # Fevers, polyarthralgia, petechial rash # Recent h/p Group A strep pharyngitis  # Recent h/o syphilis s/p tx  # H/o inflammatory arthritis, lost to fu with Rheum and prior h/o improvement with prednisone/steroid  11/04/23 Urine GC negative 11/05/23 HIV NR 10/12/23 Hep B surface ag negative 3/1 HCV NR RPR 1: 128> 1: 64 3/2 One dose of 40mg  prednisone  with improvement in pain and ROM of b/l shoulder and neck. Left elbow feels more or less the same per patient but improvement in pain/swelling per primary  3/4 MRI reviewed by Ortho, no intervention recommended including aspiration. Recommend to start steroids.     # Mild transaminitis - will monitor   # Leukocytosis - resolved, no infective etiology identified and possibly reactive  Recommendations - DC antibiotics as very low suspicion for infective etiology and some improvement on steroids  - Monitor CBC and CMP on abtx - Fu  for improvement on steroids  - She will need to closely follow up with Rheumatology, discussed.  - Universal/standard isolation precautions  - d/w Dr Sharl Ma   Rest of the management as per the primary team. Thank you for the consult. Please page with pertinent questions or concerns.  ______________________________________________________________________ Subjective patient seen and examined at the bedside. She reports continued improvement in pain and mobility of her left elbow and shoulders. Pain and swelling of left elbow same. Continued pain in the b/l knees and b/l ankles and heel. She feels one more knot in the left knee area.   Vitals BP 101/66 (BP Location: Right Arm)   Pulse 80   Temp (!) 97.5 F (36.4 C) (Oral)   Resp 16   Ht 5\' 3"  (1.6 m)   Wt 74.1 kg   LMP  (LMP Unknown)   SpO2 96%   BMI 28.94 kg/m     Physical Exam Constitutional: Adult female lying in the bed, she just woke up from sleeping    Comments: HEENT WNL  Cardiovascular:     Rate and Rhythm: Normal rate and regular rhythm.     Heart sounds:   Pulmonary:     Effort: Pulmonary effort is normal.     Comments:   Abdominal:     Palpations: Abdomen is soft.     Tenderness:   Musculoskeletal:        General:  Mild swelling, tenderness, redness in her left elbow, she has reasonable range of motion during active and passive movement.   She has improved range of motion in her bilateral shoulders, right elbow with no obvious redness, swelling or warmth  Still has pain during range of motion of bilateral knees  as well as bilateral ankles but no obvious redness, swelling, tenderness or warmth  Skin:    Comments: prior rashes resolved  Neurological:     General: Awake, alert and oriented, grossly nonfocal  Psychiatric:        Mood and Affect: Mood normal.   Pertinent Microbiology    Latest Ref Rng & Units 11/09/2023    9:53 AM 11/08/2023   10:43 AM 11/07/2023    4:29 AM  CBC  WBC 4.0 - 10.5 K/uL 6.9   11.6  14.8   Hemoglobin 12.0 - 15.0 g/dL 57.8  46.9  62.9   Hematocrit 36.0 - 46.0 % 41.0  42.5  39.4   Platelets 150 - 400 K/uL 558  600  474       Latest Ref Rng & Units 11/09/2023    9:53 AM 11/08/2023   10:43 AM 11/07/2023    4:29 AM  CMP  Glucose 70 - 99 mg/dL 91  528  97   BUN 6 - 20 mg/dL 9  7  <5   Creatinine 4.13 - 1.00 mg/dL 2.44  0.10  2.72   Sodium 135 - 145 mmol/L 138  139  137   Potassium 3.5 - 5.1 mmol/L 3.5  3.3  3.1   Chloride 98 - 111 mmol/L 102  100  99   CO2 22 - 32 mmol/L 26  25  27    Calcium 8.9 - 10.3 mg/dL 9.1  9.2  8.6   Total Protein 6.5 - 8.1 g/dL 7.8  8.1  7.4   Total Bilirubin 0.0 - 1.2 mg/dL 0.7  0.7  0.5   Alkaline Phos 38 - 126 U/L 83  90  87   AST 15 - 41 U/L 77  48  76   ALT 0 - 44 U/L 54  46  53    Pertinent Lab.    Latest Ref Rng & Units 11/09/2023    9:53 AM 11/08/2023   10:43 AM 11/07/2023    4:29 AM  CBC  WBC 4.0 - 10.5 K/uL 6.9  11.6  14.8   Hemoglobin 12.0 - 15.0 g/dL 53.6  64.4  03.4   Hematocrit 36.0 - 46.0 % 41.0  42.5  39.4   Platelets 150 - 400 K/uL 558  600  474       Latest Ref Rng & Units 11/09/2023    9:53 AM 11/08/2023   10:43 AM 11/07/2023    4:29 AM  CMP  Glucose 70 - 99 mg/dL 91  742  97   BUN 6 - 20 mg/dL 9  7  <5   Creatinine 5.95 - 1.00 mg/dL 6.38  7.56  4.33   Sodium 135 - 145 mmol/L 138  139  137   Potassium 3.5 - 5.1 mmol/L 3.5  3.3  3.1   Chloride 98 - 111 mmol/L 102  100  99   CO2 22 - 32 mmol/L 26  25  27    Calcium 8.9 - 10.3 mg/dL 9.1  9.2  8.6   Total Protein 6.5 - 8.1 g/dL 7.8  8.1  7.4   Total Bilirubin 0.0 - 1.2 mg/dL 0.7  0.7  0.5   Alkaline Phos 38 - 126 U/L 83  90  87   AST 15 - 41 U/L 77  48  76   ALT 0 - 44 U/L 54  46  53    Pertinent Imaging today Plain films and CT images have been personally visualized and interpreted;  radiology reports have been reviewed. Decision making incorporated into the Impression /   No results found.  I have personally spent 50 minutes involved in face-to-face and  non-face-to-face activities for this patient on the day of the visit. Professional time spent includes the following activities: Preparing to see the patient (review of tests), Obtaining and/or reviewing separately obtained history (admission/discharge record), Performing a medically appropriate examination and/or evaluation , Ordering medications/tests/procedures, referring and communicating with other health care professionals, Documenting clinical information in the EMR, Independently interpreting results (not separately reported), Communicating results to the patient/family/caregiver, Counseling and educating the patient/family/caregiver and Care coordination (not separately reported).   Plan d/w requesting provider as well as ID pharm D  Of note, portions of this note may have been created with voice recognition software. While this note has been edited for accuracy, occasional wrong-word or 'sound-a-like' substitutions may have occurred due to the inherent limitations of voice recognition software.   Electronically signed by:   Odette Fraction, MD Infectious Disease Physician [facility] Pager: [phone]

## 2023-11-09 NOTE — Plan of Care (Signed)
  Problem: Clinical Measurements: Goal: Diagnostic test results will improve Outcome: Progressing Goal: Signs and symptoms of infection will decrease Outcome: Progressing   Problem: Education: Goal: Knowledge of General Education information will improve Description: Including pain rating scale, medication(s)/side effects and non-pharmacologic comfort measures Outcome: Progressing   Problem: Health Behavior/Discharge Planning: Goal: Ability to manage health-related needs will improve Outcome: Progressing   Problem: Clinical Measurements: Goal: Ability to maintain clinical measurements within normal limits will improve Outcome: Progressing Goal: Will remain free from infection Outcome: Progressing

## 2023-11-09 NOTE — Progress Notes (Addendum)
 [facility]  PROGRESS NOTE  Judith Drake ZOX:[id number] DOB: 01/29/1971 DOA: 11/04/2023 PCP: Allegra Grana, FNP   Brief HPI:   53 year old female with history of hypertension, depression/anxiety, alcohol use, recent diagnosis of syphilis and completed  4 weeks of doxycycline, recent admission for sepsis due to streptococcal pharyngitis s/p treatment with ceftriaxone who presented to the emergency department with complaint of polyarthropathy, fever, chills.  Developed pain and swelling in multiple joints  about 3 days ago.Also developed petechial rash involving her extremities.  On presentation, she was hemodynamically stable.  Labs showed leukocytosis of 18.2, potassium of 3, elevated ESR/CRP.  ID consulted, plan for testing GC/chlamydia.  Started on empiric vancomycin, ceftriaxone.  Culture sent     Assessment/Plan:   Symmetrical polyarthropathy/?  Sepsis: -Presented with leukocytosis, tachycardia, fever, polyarthralgia -Improving, WBC down to 6000, initially came with WBC of 18,000 -She had recent treatment for syphilis and group A strep pharyngitis -ID and orthopedics was consulted.  Patient was started on Rocephin and vancomycin -GC/chlamydia urine obtained, result is negative -Patient does have history of inflammatory arthritis, has been seen by rheumatologist as outpatient -She was on prednisone at one point with significant improvement however because of the insurance issues she could not be on prednisone.  She was started on hydroxychloroquine however patient does not want to take that. -MRI of left elbow did not show drainable abscess -Discussed with ID, patient will likely benefit from being on steroids as this is not likely an infectious process. -ID wanted to have Ortho reevaluate before starting prednisone -Ortho has seen the patient, no plan for drainage of bursa fluid from left elbow -Will start prednisone taper today -Patient can follow-up with her  rheumatologist, Dr. Kathi Ludwig as outpatient   History of right ankle fracture:  X-ray showed ORIF of prior healed distal fibula fracture. No evidence of orthopedic hardware failure or loosening. Diffuse soft tissue swelling greatest within the anterior and lateral ankle.  Orthopedics also consulted.  No plan for any intervention  History of left elbow injury, she has hardware in left elbow.  X-ray of the left elbow does not show any loosening of hardware.  No intervention recommended per orthopedics. -MRI of left elbow did not show osteomyelitis or drainable abscess -Ortho did reevaluation of left elbow swelling -No plan for intervention   History of syphilis:  Recently completed 28 days treatment with doxycycline.  Started on Florastor   Hypokalemia:  -Replete   Hypertension: Continue amlodipine   Depression/anxiety: Continue Cymbalta  Transaminitis -Unclear etiology.   -Improving       amLODipine  10 mg Oral Daily   DULoxetine  60 mg Oral Daily   enoxaparin (LOVENOX) injection  40 mg Subcutaneous Q24H   pantoprazole  40 mg Oral QAC breakfast   saccharomyces boulardii  250 mg Oral BID   sodium chloride flush  3 mL Intravenous Q12H     Data Reviewed:   CBG:  No results for input(s): "GLUCAP" in the last 168 hours.  SpO2: 98 %    Vitals:   11/08/23 0848 11/08/23 1214 11/08/23 2027 11/09/23 0430  BP: 117/66 104/85 111/68 129/87  Pulse: 78 92 82 79  Resp: 16 18 18 18   Temp: 97.6 F (36.4 C) 98 F (36.7 C) 98.6 F (37 C) 97.9 F (36.6 C)  TempSrc: Oral Oral Oral Oral  SpO2: 97% 96% 97% 98%  Weight:      Height:          Data Reviewed:  Basic Metabolic Panel: Recent Labs  Lab 11/05/23 0855 11/06/23 0438 11/07/23 0429 11/08/23 1043 11/09/23 0953  NA 131* 139 137 139 138  K 3.3* 3.5 3.1* 3.3* 3.5  CL 94* 99 99 100 102  CO2 25 26 27 25 26   GLUCOSE 107* 88 97 135* 91  BUN 8 5* <5* 7 9  CREATININE 0.47 0.49 0.45 0.42* 0.40*  CALCIUM 9.0 9.2 8.6* 9.2  9.1    CBC: Recent Labs  Lab 11/04/23 1827 11/05/23 0855 11/06/23 0438 11/07/23 0429 11/08/23 1043 11/09/23 0953  WBC 18.2* 17.1* 16.9* 14.8* 11.6* 6.9  NEUTROABS 14.9*  --   --   --   --   --   HGB 15.7* 13.0 13.6 12.5 13.4 12.7  HCT 47.1* 37.7 42.7 39.4 42.5 41.0  MCV 94.6 94.5 100.2* 99.0 98.8 101.2*  PLT 516* 435* 487* 474* 600* 558*    LFT Recent Labs  Lab 11/04/23 2226 11/05/23 0855 11/07/23 0429 11/08/23 1043 11/09/23 0953  AST 50* 45* 76* 48* 77*  ALT 50* 41 53* 46* 54*  ALKPHOS 79 73 87 90 83  BILITOT 1.2 1.2 0.5 0.7 0.7  PROT 8.6* 7.7 7.4 8.1 7.8  ALBUMIN 3.9 3.6 3.1* 3.5 3.2*     Antibiotics: Anti-infectives (From admission, onward)    Start     Dose/Rate Route Frequency Ordered Stop   11/07/23 2200  vancomycin (VANCOREADY) IVPB 1500 mg/300 mL        1,500 mg 150 mL/hr over 120 Minutes Intravenous Every 24 hours 11/07/23 1105     11/05/23 2300  cefTRIAXone (ROCEPHIN) 2 g in sodium chloride 0.9 % 100 mL IVPB        2 g 200 mL/hr over 30 Minutes Intravenous Every 24 hours 11/05/23 0125     11/05/23 2200  vancomycin (VANCOREADY) IVPB 1500 mg/300 mL  Status:  Discontinued        1,500 mg 150 mL/hr over 120 Minutes Intravenous Every 24 hours 11/04/23 2352 11/07/23 1036   11/04/23 2315  vancomycin (VANCOREADY) IVPB 1500 mg/300 mL        1,500 mg 150 mL/hr over 120 Minutes Intravenous  Once 11/04/23 2302 11/05/23 0522   11/04/23 2300  cefTRIAXone (ROCEPHIN) 2 g in sodium chloride 0.9 % 100 mL IVPB        2 g 200 mL/hr over 30 Minutes Intravenous  Once 11/04/23 2247 11/05/23 0100        DVT prophylaxis: Enoxaparin  Code Status: Full code  Family Communication: Discussed with patient husband at bedside   CONSULTS infectious disease, orthopedics   Subjective   Left elbow swelling has improved.  Appreciate orthopedics recommendation.   Objective    Physical Examination:  Appears in no acute distress S1-S2, regular Lungs clear to  auscultation bilaterally Abdomen is soft, nontender, no organomegaly  Status is: Inpatient:             [name]   [facility] If 7PM-7AM, please contact night-coverage at [email or web address], Office  [phone]   11/09/2023, 10:42 AM  LOS: 4 days

## 2023-11-10 ENCOUNTER — Other Ambulatory Visit (HOSPITAL_COMMUNITY): Payer: Self-pay

## 2023-11-10 DIAGNOSIS — R5081 Fever presenting with conditions classified elsewhere: Secondary | ICD-10-CM | POA: Diagnosis not present

## 2023-11-10 DIAGNOSIS — F509 Eating disorder, unspecified: Secondary | ICD-10-CM | POA: Diagnosis not present

## 2023-11-10 DIAGNOSIS — M2550 Pain in unspecified joint: Secondary | ICD-10-CM | POA: Diagnosis not present

## 2023-11-10 DIAGNOSIS — R21 Rash and other nonspecific skin eruption: Secondary | ICD-10-CM | POA: Diagnosis not present

## 2023-11-10 DIAGNOSIS — M130 Polyarthritis, unspecified: Secondary | ICD-10-CM | POA: Diagnosis not present

## 2023-11-10 DIAGNOSIS — M7022 Olecranon bursitis, left elbow: Secondary | ICD-10-CM | POA: Diagnosis not present

## 2023-11-10 LAB — CULTURE, BLOOD (ROUTINE X 2)
Culture: NO GROWTH
Culture: NO GROWTH
Special Requests: ADEQUATE
Special Requests: ADEQUATE

## 2023-11-10 MED ORDER — PREDNISONE 10 MG PO TABS
ORAL_TABLET | ORAL | 0 refills | Status: AC
  Filled 2023-11-10: qty 12, 6d supply, fill #0

## 2023-11-10 NOTE — Plan of Care (Signed)
  Problem: Clinical Measurements: Goal: Diagnostic test results will improve Outcome: Progressing Goal: Signs and symptoms of infection will decrease Outcome: Progressing   Problem: Education: Goal: Knowledge of General Education information will improve Description: Including pain rating scale, medication(s)/side effects and non-pharmacologic comfort measures Outcome: Progressing   Problem: Health Behavior/Discharge Planning: Goal: Ability to manage health-related needs will improve Outcome: Progressing   Problem: Clinical Measurements: Goal: Ability to maintain clinical measurements within normal limits will improve Outcome: Progressing Goal: Will remain free from infection Outcome: Progressing Goal: Diagnostic test results will improve Outcome: Progressing Goal: Cardiovascular complication will be avoided Outcome: Progressing   Problem: Activity: Goal: Risk for activity intolerance will decrease Outcome: Progressing   Problem: Nutrition: Goal: Adequate nutrition will be maintained Outcome: Progressing   Problem: Coping: Goal: Level of anxiety will decrease Outcome: Progressing   Problem: Elimination: Goal: Will not experience complications related to bowel motility Outcome: Progressing Goal: Will not experience complications related to urinary retention Outcome: Progressing   Problem: Pain Managment: Goal: General experience of comfort will improve and/or be controlled Outcome: Progressing   Problem: Safety: Goal: Ability to remain free from injury will improve Outcome: Progressing   Problem: Skin Integrity: Goal: Risk for impaired skin integrity will decrease Outcome: Progressing

## 2023-11-10 NOTE — Progress Notes (Addendum)
 [facility] Infectious Diseases Follow Up Note  Patient Identification: Patient Name: Judith Drake MRN: [id number] Admit Date: 11/04/2023  5:54 PM Age: 53 y.o.Today's Date: 11/10/2023  Reason for Visit: Polyarthralgia, olecranon bursitis  Principal Problem:   Sepsis (HCC) Active Problems:   Anxiety and depression   Polyarthritis   Polyarthralgia   Fever   Olecranon bursitis of left elbow  Antibiotics:  Vancomycin 2/27- 3/3 Ceftriaxone 2/27- 3/3    Lines/Hardware:  Interval Events: afebrile, no labs today   Assessment 53 year old female with PMH as below including HTN, GERD, anxiety/depression, alcohol use, Fatty liver s/p right ankle ORIF, Inflammatory arthritis, Syphilis s/p 28 days of doxycycline and recent admission February 20-22 for strep pharyngitis( treated with ceftriaxone> cefadroxil) who presented to the ED on 2/27 with polyarthralgia, fevers, chills and generalized weakness.   # Fevers, polyarthralgia, petechial rash # Recent h/p Group A strep pharyngitis  # Recent h/o syphilis s/p tx  # H/o inflammatory arthritis, lost to fu with Rheum and prior h/o improvement with prednisone/steroid  11/04/23 Urine GC negative 11/05/23 HIV NR 10/12/23 Hep B surface ag negative 3/1 HCV NR RPR 1: 128> 1: 64 3/2 One dose of 40mg  prednisone  with improvement in pain and ROM of b/l shoulder and neck. Left elbow feels more or less the same per patient but improvement in pain/swelling per primary  3/4 MRI reviewed by Ortho, no intervention recommended including aspiration. Recommend to start steroids.  Antibiotics stopped, restarted prednisone  3/5 overall improvement in joint pain/swelling and mobility including left elbow  # Mild transaminitis - will monitor   # Leukocytosis - resolved, no infective etiology identified and possibly reactive  Recommendations - continue prednisone course for possible inflammatory/rheumatologic  source of polyarthralgia and bursitis.  - Follow up closely with Rheumatology - Discussed with patient regarding symptoms and signs of infection to seek immediate attention   Rest of the management as per the primary team. Thank you for the consult. Please page with pertinent questions or concerns.  ______________________________________________________________________ Subjective patient seen and examined at the bedside. Pain and swelling in all joints are improving including left elbow. She feels significant improvement after dose of prednisone.    Vitals BP 134/84 (BP Location: Right Arm)   Pulse 87   Temp 97.8 F (36.6 C) (Oral)   Resp 18   Ht 5\' 3"  (1.6 m)   Wt 74.1 kg   LMP  (LMP Unknown)   SpO2 97%   BMI 28.94 kg/m     Physical Exam Constitutional: Adult female lying in the bed, not in acute distress     Comments: HEENT WNL  Cardiovascular:     Rate and Rhythm: Normal rate and regular rhythm.     Heart sounds:   Pulmonary:     Effort: Pulmonary effort is normal.     Comments:   Abdominal:     Palpations: Abdomen is soft.     Tenderness:   Musculoskeletal:        General:  Swelling, tenderness and redness in left elbow has improved along with ROM. No signs of septic peripheral joints including left elbow.    Skin:    Comments: prior rashes resolved  Neurological:     General: Awake, alert and oriented, grossly nonfocal  Psychiatric:        Mood and Affect: Mood normal.   Pertinent Microbiology Results for orders placed or performed during the hospital encounter of 11/04/23  Resp panel by RT-PCR (RSV, Flu A&B, Covid) Anterior  Nasal Swab     Status: None   Collection Time: 11/04/23  6:27 PM   Specimen: Anterior Nasal Swab  Result Value Ref Range Status   SARS Coronavirus 2 by RT PCR NEGATIVE NEGATIVE Final    Comment: (NOTE) SARS-CoV-2 target nucleic acids are NOT DETECTED.  The SARS-CoV-2 RNA is generally detectable in upper respiratory specimens  during the acute phase of infection. The lowest concentration of SARS-CoV-2 viral copies this assay can detect is 138 copies/mL. A negative result does not preclude SARS-Cov-2 infection and should not be used as the sole basis for treatment or other patient management decisions. A negative result may occur with  improper specimen collection/handling, submission of specimen other than nasopharyngeal swab, presence of viral mutation(s) within the areas targeted by this assay, and inadequate number of viral copies(<138 copies/mL). A negative result must be combined with clinical observations, patient history, and epidemiological information. The expected result is Negative.  Fact Sheet for Patients:  BloggerCourse.com  Fact Sheet for Healthcare Providers:  SeriousBroker.it  This test is no t yet approved or cleared by the [place] FDA and  has been authorized for detection and/or diagnosis of SARS-CoV-2 by FDA under an Emergency Use Authorization (EUA). This EUA will remain  in effect (meaning this test can be used) for the duration of the COVID-19 declaration under Section 564(b)(1) of the Act, 21 U.S.C.section 360bbb-3(b)(1), unless the authorization is terminated  or revoked sooner.       Influenza A by PCR NEGATIVE NEGATIVE Final   Influenza B by PCR NEGATIVE NEGATIVE Final    Comment: (NOTE) The Xpert Xpress SARS-CoV-2/FLU/RSV plus assay is intended as an aid in the diagnosis of influenza from Nasopharyngeal swab specimens and should not be used as a sole basis for treatment. Nasal washings and aspirates are unacceptable for Xpert Xpress SARS-CoV-2/FLU/RSV testing.  Fact Sheet for Patients: BloggerCourse.com  Fact Sheet for Healthcare Providers: SeriousBroker.it  This test is not yet approved or cleared by the [place] FDA and has been authorized for detection  and/or diagnosis of SARS-CoV-2 by FDA under an Emergency Use Authorization (EUA). This EUA will remain in effect (meaning this test can be used) for the duration of the COVID-19 declaration under Section 564(b)(1) of the Act, 21 U.S.C. section 360bbb-3(b)(1), unless the authorization is terminated or revoked.     Resp Syncytial Virus by PCR NEGATIVE NEGATIVE Final    Comment: (NOTE) Fact Sheet for Patients: BloggerCourse.com  Fact Sheet for Healthcare Providers: SeriousBroker.it  This test is not yet approved or cleared by the [place] FDA and has been authorized for detection and/or diagnosis of SARS-CoV-2 by FDA under an Emergency Use Authorization (EUA). This EUA will remain in effect (meaning this test can be used) for the duration of the COVID-19 declaration under Section 564(b)(1) of the Act, 21 U.S.C. section 360bbb-3(b)(1), unless the authorization is terminated or revoked.  Performed at [facility], [address], [place], [place] [address]   Respiratory (~20 pathogens) panel by PCR     Status: None   Collection Time: 11/04/23  6:27 PM   Specimen: Nasopharyngeal Swab; Respiratory  Result Value Ref Range Status   Adenovirus NOT DETECTED NOT DETECTED Final   Coronavirus 229E NOT DETECTED NOT DETECTED Final    Comment: (NOTE) The Coronavirus on the Respiratory Panel, DOES NOT test for the novel  Coronavirus (2019 nCoV)    Coronavirus HKU1 NOT DETECTED NOT DETECTED Final   Coronavirus NL63 NOT DETECTED NOT DETECTED  Final   Coronavirus OC43 NOT DETECTED NOT DETECTED Final   Metapneumovirus NOT DETECTED NOT DETECTED Final   Rhinovirus / Enterovirus NOT DETECTED NOT DETECTED Final   Influenza A NOT DETECTED NOT DETECTED Final   Influenza B NOT DETECTED NOT DETECTED Final   Parainfluenza Virus 1 NOT DETECTED NOT DETECTED Final   Parainfluenza Virus 2 NOT DETECTED NOT DETECTED Final    Parainfluenza Virus 3 NOT DETECTED NOT DETECTED Final   Parainfluenza Virus 4 NOT DETECTED NOT DETECTED Final   Respiratory Syncytial Virus NOT DETECTED NOT DETECTED Final   Bordetella pertussis NOT DETECTED NOT DETECTED Final   Bordetella Parapertussis NOT DETECTED NOT DETECTED Final   Chlamydophila pneumoniae NOT DETECTED NOT DETECTED Final   Mycoplasma pneumoniae NOT DETECTED NOT DETECTED Final    Comment: Performed at [facility], [address] [place], [place] [address]  Blood culture (routine x 2)     Status: None   Collection Time: 11/04/23 10:26 PM   Specimen: BLOOD  Result Value Ref Range Status   Specimen Description   Final    BLOOD Performed at [facility], [address] [place], [place] [address]    Special Requests   Final    BOTTLES DRAWN AEROBIC AND ANAEROBIC Blood Culture adequate volume Performed at [facility], [address] [place], [place] [address]    Culture   Final    NO GROWTH 5 DAYS Performed at [facility], [address], [place], [place] [address]    Report Status 11/10/2023 FINAL  Final  Blood culture (routine x 2)     Status: None   Collection Time: 11/05/23  8:55 AM   Specimen: BLOOD  Result Value Ref Range Status   Specimen Description   Final    BLOOD BLOOD LEFT HAND Performed at [facility], [address] [place], [place] [address]    Special Requests   Final    BOTTLES DRAWN AEROBIC AND ANAEROBIC Blood Culture adequate volume Performed at [facility], [address], [place], [place] [address]    Culture   Final    NO GROWTH 5 DAYS Performed at [facility], [address], [place], [place] [address]    Report Status 11/10/2023 FINAL  Final   Pertinent Lab.    Latest Ref Rng & Units 11/09/2023    9:53 AM 11/08/2023   10:43 AM 11/07/2023    4:29 AM  CBC  WBC 4.0 - 10.5 K/uL 6.9  11.6  14.8   Hemoglobin 12.0 - 15.0 g/dL 32.4  40.1   02.7   Hematocrit 36.0 - 46.0 % 41.0  42.5  39.4   Platelets 150 - 400 K/uL 558  600  474       Latest Ref Rng & Units 11/09/2023    9:53 AM 11/08/2023   10:43 AM 11/07/2023    4:29 AM  CMP  Glucose 70 - 99 mg/dL 91  253  97   BUN 6 - 20 mg/dL 9  7  <5   Creatinine 6.64 - 1.00 mg/dL 4.03  4.74  2.59   Sodium 135 - 145 mmol/L 138  139  137   Potassium 3.5 - 5.1 mmol/L 3.5  3.3  3.1   Chloride 98 - 111 mmol/L 102  100  99   CO2 22 - 32 mmol/L 26  25  27    Calcium 8.9 - 10.3 mg/dL 9.1  9.2  8.6   Total Protein 6.5 - 8.1 g/dL 7.8  8.1  7.4   Total Bilirubin 0.0 - 1.2 mg/dL 0.7  0.7  0.5   Alkaline Phos 38 - 126 U/L 83  90  87   AST 15 - 41 U/L 77  48  76   ALT 0 - 44 U/L 54  46  53    Pertinent Imaging today Plain films and CT images have been personally visualized and interpreted; radiology reports have been reviewed. Decision making incorporated into the Impression /   No results found.  I have personally spent 35 minutes involved in face-to-face and non-face-to-face activities for this patient on the day of the visit. Professional time spent includes the following activities: Preparing to see the patient (review of tests), Obtaining and/or reviewing separately obtained history (admission/discharge record), Performing a medically appropriate examination and/or evaluation , Ordering medications/tests/procedures, referring and communicating with other health care professionals, Documenting clinical information in the EMR, Independently interpreting results (not separately reported), Communicating results to the patient/family/caregiver, Counseling and educating the patient/family/caregiver and Care coordination (not separately reported).   Plan d/w requesting provider as well as ID pharm D  Of note, portions of this note may have been created with voice recognition software. While this note has been edited for accuracy, occasional wrong-word or 'sound-a-like' substitutions may have occurred  due to the inherent limitations of voice recognition software.   Electronically signed by:   Odette Fraction, MD Infectious Disease Physician [facility] for Infectious Disease Pager: [phone]

## 2023-11-10 NOTE — Progress Notes (Signed)
 Orthopedic Surgery Progress Note   Assessment: Patient is a 53 y.o. female with polyarthralgia that came on suddenly.  Got significantly better with prednisone administration.  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  I was asked to reevaluate her left elbow for possible septic bursitis.  There is no palpable bursal fluid collection.  There is also not a discrete fluid collection to aspirate on the MRI.  Her symptoms have all gotten better with prednisone administration which again makes me believe that this is more inflammatory rheumatologic in nature.  She has follow-up with rheumatology.  Encouraged her to keep that follow-up.  No operative intervention from orthopedic perspective.  Okay for diet.  Weightbearing as tolerated.  Can follow-up with Dr. August Saucer should she develop issues with her ankle -Dispo: Per primary  ___________________________________________________________________________  Subjective: Patient is doing much better since getting prednisone.  She said she feels like a new person.  She is now able to walk around the room without the significant heel pain.  All the pains that she had been feeling were getting better.  She has noticed decreased swelling as well.  She is anticipating leaving the hospital today.  She has rheumatology follow-up.  Orthopedics was reengaged to evaluate the left elbow for possible septic bursitis   Physical Exam:  General: no acute distress, appears stated age Neurologic: alert, answering questions appropriately, following commands Respiratory: unlabored breathing on room air, symmetric chest rise Psychiatric: appropriate affect, normal cadence to speech  MSK:   -Left upper extremity  Has decreased range of motion at the elbow which she said has been present since fracture fixation.  She has no pain  through her normal range of motion.  There is still some swelling around the elbow.  No erythema.  No palpable fluid collection around the olecranon bursa. Fires deltoid, biceps, triceps, wrist extensors, wrist flexors, finger extensors, finger flexors  AIN/PIN/IO intact  Palpable radial pulse  Sensation intact to light touch in median/ulnar/radial/axillary nerve distributions  Hand warm and well perfused  -Right lower extremity  Ankle swelling improved since she was last seen, less erythema around the ankle, weightbearing in sandals Fires hip flexors, quadriceps, hamstrings, tibialis anterior, gastrocnemius and soleus, extensor hallucis longus  No pain through active or passive range of motion of the ankle Incision is well-approximated with no drainage or sinus tract seen Plantarflexes and dorsiflexes toes Sensation intact to light touch in sural, saphenous, tibial, deep peroneal, and superficial peroneal nerve distributions Foot warm and well perfused  MRI of the left elbow from 11/06/2023 was independently reviewed and interpreted, showing screws within the distal humerus.  There is metal artifact obscuring the view.  No evidence of osteomyelitis.  T2 signal seen around the posterior aspect of the elbow but no discernible or discrete collection of fluid.  No fracture seen.  Patient name: Judith Drake Patient MRN: [id number] Date: 11/10/23

## 2023-11-10 NOTE — Discharge Summary (Addendum)
 Physician Discharge Summary  Judith Drake:[id number] DOB: 01-15-1971 DOA: 11/04/2023  PCP: Allegra Grana, FNP  Admit date: 11/04/2023 Discharge date: 11/10/2023  Admitted From: Home Disposition:  Home  Recommendations for Outpatient Follow-up:  Follow up with PCP in 1-2 weeks Follow up with Rheum as discussed  Home Health:None  Equipment/Devices:None  Discharge Condition:Stable  CODE STATUS:Fuill  Diet recommendation:  As tolerated  Brief/Interim Summary: 53 year old female with history of hypertension, depression/anxiety, alcohol use, recent diagnosis of syphilis and completed  4 weeks of doxycycline, recent admission for sepsis due to streptococcal pharyngitis s/p treatment with ceftriaxone who presented to the emergency department with complaint of polyarthropathy, fever, chills.  Developed pain and swelling in multiple joints  about 3 days ago.Also developed petechial rash involving her extremities.  On presentation, she was hemodynamically stable.  Labs showed leukocytosis of 18.2, potassium of 3, elevated ESR/CRP.  ID consulted, plan for testing GC/chlamydia.  Started on empiric vancomycin, ceftriaxone.  Culture sent   Patient admitted as above with concern over septic arthritis, disseminated.  Patient underwent extensive testing evaluation, imaging with multiple consults ultimately with no clear source of infection.  Lengthy discussion with patient given improvement on steroids in regards to follow-up with rheumatology outpatient as she had previously been under their care for an unspecified polyarthropathy noninfectious in nature.  Given her marked improvement and negative infectious workup patient is otherwise stable and agreeable for discharge, continue steroid taper as outlined below no further indication for antibiotics at this time.  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 MRI of left elbow did not show osteomyelitis or drainable abscess -Ortho previously following no indication for intervention   History of syphilis:  Recently completed 28 days treatment with doxycycline. Started on Florastor   Hypokalemia: -Repleted Hypertension: Continue amlodipine Depression/anxiety: Continue Cymbalta Transaminitis-Unclear etiology.   Discharge Instructions   Allergies as of 11/10/2023       Reactions   Amoxicillin Nausea And Vomiting   Has patient had a PCN reaction causing immediate rash, facial/tongue/throat swelling, SOB or lightheadedness with hypotension: No Has patient had a PCN reaction causing severe rash involving mucus membranes or skin necrosis: No Has patient had a PCN reaction that required hospitalization: No Has patient had a PCN reaction occurring within the last 10 years: No If all of the above answers are "NO", then may proceed with Cephalosporin use.   Dilaudid [hydromorphone Hcl] Itching   Morphine And Codeine Other (See Comments)   Hallucinations         Medication List     STOP taking these medications    cefadroxil 500 MG capsule Commonly known as: DURICEF       TAKE these medications    amLODipine 10 MG tablet Commonly known as: NORVASC Take 1 tablet (10 mg total) by mouth daily.   ascorbic acid 500 MG tablet Commonly known as: VITAMIN C Take 500-1,000 mg by mouth daily.   BC HEADACHE POWDER PO Take 1 packet by mouth 2 (two) times daily as needed (for pain, headaches, or discomfort).   cetirizine 10 MG tablet Commonly known as: ZYRTEC Take 1 tablet (10  mg total) by mouth daily. What changed:  when to take this reasons to take  this   DULoxetine 30 MG capsule Commonly known as: Cymbalta Take 2 capsules (60 mg total) by mouth daily.   ferrous sulfate 325 (65 FE) MG EC tablet Take 1 tablet (325 mg total) by mouth daily with breakfast.   omeprazole 20 MG capsule Commonly known as: PRILOSEC Take 1 capsule (20 mg total) by mouth daily. What changed: when to take this   ondansetron 4 MG tablet Commonly known as: Zofran Take 1 tablet (4 mg total) by mouth every 8 (eight) hours as needed for nausea or vomiting.   predniSONE 10 MG tablet Commonly known as: DELTASONE Take 3 tablets (30 mg total) by mouth daily with breakfast for 2 days, THEN 2 tablets (20 mg total) daily with breakfast for 2 days, THEN 1 tablet (10 mg total) daily with breakfast for 2 days. Start taking on: November 11, 2023        Allergies  Allergen Reactions   Amoxicillin Nausea And Vomiting    Has patient had a PCN reaction causing immediate rash, facial/tongue/throat swelling, SOB or lightheadedness with hypotension: No Has patient had a PCN reaction causing severe rash involving mucus membranes or skin necrosis: No Has patient had a PCN reaction that required hospitalization: No Has patient had a PCN reaction occurring within the last 10 years: No If all of the above answers are "NO", then may proceed with Cephalosporin use.    Dilaudid [Hydromorphone Hcl] Itching   Morphine And Codeine Other (See Comments)    Hallucinations     Consultations: Orthopedics, infectious disease  Procedures/Studies: MR ELBOW LEFT W WO CONTRAST Result Date: 11/07/2023 CLINICAL DATA:  Fevers, polyarthralgia, pain and swelling in the left elbow. Limited mobility. Previous surgery. EXAM: MRI OF THE LEFT ELBOW WITHOUT AND WITH CONTRAST TECHNIQUE: Multiplanar, multisequence MR imaging of the elbow was performed before and after the administration of intravenous contrast. CONTRAST:  7mL GADAVIST GADOBUTROL 1 MMOL/ML IV SOLN COMPARISON:  Radiographs 11/04/2023.  FINDINGS: Technical note: Despite efforts by the technologist and patient, mild motion artifact is present on today's exam and could not be eliminated. This reduces exam sensitivity and specificity. Susceptibility artifact from previous distal humeral screw fixation also limits image quality. TENDONS Common forearm flexor origin: Partially obscured by artifact. Grossly intact. Common forearm extensor origin: Partially obscured by artifact. Grossly intact. Biceps: Intact. Triceps: Intact with normal signal. LIGAMENTS Medial stabilizers: The ulnar collateral ligament appears diffusely attenuated, possibly from previous injury. Lateral stabilizers: Partially obscured by artifact. Grossly intact radial and lateral ulnar collateral ligaments. Cartilage: As demonstrated radiographically, there are advanced ulnohumeral and radiocapitellar degenerative changes with chondral thinning and osteophytes. Mild subchondral cyst formation in the coronoid process. Joint: Small joint effusion without suspicious synovial enhancement. Evidence for intra-articular loose bodies, including a 1.2 cm loose body anteriorly in the coronoid fossa. Cubital tunnel: Unremarkable.  The ulnar nerve appears normal. Bones: As above, postsurgical changes from previous distal humeral screw fixation with associated susceptibility artifact. No evidence of acute fracture, dislocation or osteomyelitis. Advanced degenerative changes are noted. Other: Diffuse subcutaneous edema surrounding the elbow, greatest posteriorly. Postcontrast images demonstrate heterogeneous subcutaneous enhancement posteriorly with ill-defined fluid posterior to the olecranon process, likely indicating olecranon bursitis. Overall, this fluid measures approximately 1.9 x 1.4 x 0.4 cm. No organized or drainable fluid collections are identified. Probable reactive epitrochlear lymph nodes. IMPRESSION: 1. Diffuse subcutaneous edema surrounding the elbow, greatest posteriorly, with  ill-defined fluid posterior  to the olecranon process, likely indicating olecranon bursitis. No organized or drainable fluid collections are identified. 2. Postsurgical changes from previous distal humeral screw fixation with associated susceptibility artifact. No evidence of acute fracture, dislocation or osteomyelitis. 3. Advanced degenerative changes in the elbow with small joint effusion and intra-articular loose bodies. These findings may contribute to decreased range of motion. Consider CT for further evaluation. 4. The biceps tendon appears intact. The ulnar collateral ligament appears diffusely attenuated, possibly from previous injury. Electronically Signed   By: Carey Bullocks M.D.   On: 11/07/2023 09:13   MR ANKLE RIGHT W WO CONTRAST Result Date: 11/05/2023 CLINICAL DATA:  Worsening right ankle pain. History of prior surgery. Concern for septic arthritis. Fevers, chills, and body aches. EXAM: MRI OF THE RIGHT ANKLE WITHOUT AND WITH CONTRAST TECHNIQUE: Multiplanar, multisequence MR imaging of the ankle was performed before and after the administration of intravenous contrast. CONTRAST:  7mL GADAVIST GADOBUTROL 1 MMOL/ML IV SOLN COMPARISON:  Right ankle x-rays from yesterday. FINDINGS: TENDONS Peroneal: Peroneal longus tendon intact. Peroneal brevis intact. Posteromedial: Posterior tibial tendon intact. Flexor digitorum longus tendon intact. Flexor hallucis longus tendon intact. Anterior: Tibialis anterior tendon intact. Extensor hallucis longus tendon intact. Extensor digitorum longus tendon intact. Achilles:  Intact. Plantar Fascia: Intact. LIGAMENTS Lateral: Anterior talofibular ligament intact. Calcaneofibular ligament intact. Posterior talofibular ligament intact. Anterior and posterior tibiofibular ligaments intact. Medial: Deltoid ligament intact. Spring ligament intact. CARTILAGE Ankle Joint: No joint effusion. Normal ankle mortise. Mild partial-thickness cartilage loss. Subtalar Joints/Sinus  Tarsi: Normal subtalar joints. No subtalar joint effusion. Normal sinus tarsi. Bones: No worrisome marrow signal abnormality. Prior distal fibula ORIF with associated hardware susceptibility artifact. Healed posterior malleolar fracture with irregularity of the articular surface. Old avulsion fracture of the medial malleolus. No acute fracture or dislocation. No suspicious bone lesion. Soft Tissue: Lateral hindfoot soft tissue swelling. No soft tissue mass or fluid collection. IMPRESSION: IMPRESSION 1. Nonspecific lateral hindfoot soft tissue swelling. No evidence of septic arthritis or osteomyelitis. 2. Old trimalleolar fracture status post distal fibula ORIF. 3. Mild posttraumatic tibiotalar osteoarthritis. Electronically Signed   By: Obie Dredge M.D.   On: 11/05/2023 08:32   DG Elbow Complete Left Result Date: 11/05/2023 CLINICAL DATA:  Leukocytosis. EXAM: LEFT ELBOW - COMPLETE 3+ VIEW COMPARISON:  None FINDINGS: There are 2 orthopedic screws in the distal humerus. No acute fracture or dislocation identified. There are moderate degenerative changes of the elbow with joint space narrowing and osteophyte formation. There is also likely healed radial head fracture. There is diffuse soft tissue swelling surrounding the elbow and proximal forearm. No joint effusion identified. IMPRESSION: 1. No acute fracture or dislocation. 2. Moderate degenerative changes of the elbow. 3. Diffuse soft tissue swelling surrounding the elbow and proximal forearm. Electronically Signed   By: Darliss Cheney M.D.   On: 11/05/2023 00:46   DG Shoulder Right Result Date: 11/05/2023 CLINICAL DATA:  Leukocytosis EXAM: RIGHT SHOULDER - 2+ VIEW COMPARISON:  None Available. FINDINGS: There is no evidence of fracture or dislocation. There are mild degenerative changes of the acromioclavicular joint. Soft tissues are unremarkable. IMPRESSION: 1. No acute fracture or dislocation. 2. Mild degenerative changes of the acromioclavicular joint.  Electronically Signed   By: Darliss Cheney M.D.   On: 11/05/2023 00:45   DG Chest 2 View Result Date: 11/05/2023 CLINICAL DATA:  Leukocytosis. EXAM: CHEST - 2 VIEW COMPARISON:  Chest x-ray 10/28/2023 FINDINGS: The heart size and mediastinal contours are within normal limits. Both lungs are clear. There  are healed left-sided rib fractures. IMPRESSION: No active cardiopulmonary disease. Electronically Signed   By: Darliss Cheney M.D.   On: 11/05/2023 00:44   DG Ankle Complete Right Result Date: 11/04/2023 CLINICAL DATA:  Swelling and pain in right ankle, surgery 1 year ago EXAM: RIGHT ANKLE - COMPLETE 3+ VIEW COMPARISON:  05/06/2022 FINDINGS: Frontal, oblique, and lateral views of the right ankle. Lateral plate and screw fixation across the distal fibula is noted, spanning a healed distal fibular fracture. There are no acute or destructive bony abnormalities. Mild osteoarthritis of the tibiotalar joint. Prominent inferior calcaneal spur. There is diffuse soft tissue edema, greatest anteriorly and laterally at the ankle. IMPRESSION: 1. ORIF of a prior healed distal fibular fracture. No evidence of orthopedic hardware failure or loosening. 2. Diffuse soft tissue swelling greatest within the anterior and lateral ankle. 3. Osteoarthritis of the ankle. 4. Prominent inferior calcaneal spur. Electronically Signed   By: Sharlet Salina M.D.   On: 11/04/2023 20:15   DG FL GUIDED LUMBAR PUNCTURE Result Date: 10/29/2023 CLINICAL DATA:  Patient with history of tertiary syphilis, recent fever, neck pain, headache, leukocytosis. EXAM: LUMBAR PUNCTURE UNDER FLUOROSCOPY PROCEDURE: An appropriate skin entry site was determined fluoroscopically. Operator donned sterile gloves and mask. Skin site was marked, then prepped with Betadine, draped in usual sterile fashion, and infiltrated locally with 1% lidocaine. A 20 gauge spinal needle advanced into the thecal sac initially at L3-4, then repositioned to L 4-5 from an interlaminar  approach. Clear colorless CSF spontaneously returned. 9 ml CSF were collected and divided among 4 sterile vials for the requested laboratory studies. The needle was then removed. The patient tolerated the procedure well and there were no complications. FLUOROSCOPY: Radiation Exposure Index (as provided by the fluoroscopic device): 9.5 mGy Kerma IMPRESSION: Successful lumbar puncture under fluoroscopy. This exam was performed by Artemio Aly and was supervised and interpreted by Dr. Roanna Banning Electronically Signed   By: Roanna Banning M.D.   On: 10/29/2023 17:10   MR BRAIN W WO CONTRAST Result Date: 10/29/2023 CLINICAL DATA:  Provided history: Headache, sudden, severe. Neck pain. Recent syphylis. EXAM: MRI HEAD WITHOUT AND WITH CONTRAST TECHNIQUE: Multiplanar, multiecho pulse sequences of the brain and surrounding structures were obtained without and with intravenous contrast. CONTRAST:  7mL GADAVIST GADOBUTROL 1 MMOL/ML IV SOLN COMPARISON:  Brain MRI 01/16/2022. FINDINGS: Brain: Mild generalized cerebral atrophy. 7 mm pineal cyst. No cortical encephalomalacia is identified. No significant cerebral white matter disease. There is no acute infarct. No evidence of an intracranial mass. No chronic intracranial blood products. No extra-axial fluid collection. No midline shift. No pathologic intracranial enhancement identified. Vascular: Maintained flow voids within the proximal large arterial vessels. Skull and upper cervical spine: No focal worrisome marrow lesion. Incompletely assessed cervical spondylosis. Sinuses/Orbits: No mass or acute finding within the imaged orbits. Minimal mucosal thickening within the bilateral ethmoid and right sphenoid sinuses. Other: Bilateral mastoid effusions. Cervical lymphadenopathy at the imaged levels. For instance, a left level II lymph node measures 15 mm in short axis (series 7, image 19). IMPRESSION: 1. No evidence of an acute intracranial abnormality. 2. Mild generalized  cerebral atrophy. 3. Unchanged 7 mm pineal cyst. 4. Otherwise unremarkable MRI appearance of the brain. 5. Minor paranasal sinus mucosal thickening. 6. Bilateral mastoid effusions. 7. Incompletely assessed cervical lymphadenopathy. Electronically Signed   By: Jackey Loge D.O.   On: 10/29/2023 10:21   DG Chest 2 View Result Date: 10/28/2023 CLINICAL DATA:  Fever.  Flu-like symptoms. EXAM: CHEST -  2 VIEW COMPARISON:  Radiograph and CT 09/08/2022 FINDINGS: The cardiomediastinal contours are normal. Bronchial thickening. Pulmonary vasculature is normal. No consolidation, pleural effusion, or pneumothorax. Multiple remote bilateral rib fractures. No acute osseous abnormalities are seen. IMPRESSION: Bronchial thickening without focal airspace disease. Electronically Signed   By: Narda Rutherford M.D.   On: 10/28/2023 18:17     Subjective: No acute issues or events overnight   Discharge Exam: Vitals:   11/09/23 2159 11/10/23 0433  BP: 109/78 134/84  Pulse: 94 87  Resp: 18 18  Temp: 98 F (36.7 C) 97.8 F (36.6 C)  SpO2: 95% 97%   Vitals:   11/09/23 0430 11/09/23 1325 11/09/23 2159 11/10/23 0433  BP: 129/87 101/66 109/78 134/84  Pulse: 79 80 94 87  Resp: 18 16 18 18   Temp: 97.9 F (36.6 C) (!) 97.5 F (36.4 C) 98 F (36.7 C) 97.8 F (36.6 C)  TempSrc: Oral Oral Oral Oral  SpO2: 98% 96% 95% 97%  Weight:      Height:        General: Pt is alert, awake, not in acute distress Cardiovascular: RRR, S1/S2 +, no rubs, no gallops Respiratory: CTA bilaterally, no wheezing, no rhonchi Abdominal: Soft, NT, ND, bowel sounds + Extremities: no edema, no cyanosis    The results of significant diagnostics from this hospitalization (including imaging, microbiology, ancillary and laboratory) are listed below for reference.     Microbiology: Recent Results (from the past 240 hours)  Resp panel by RT-PCR (RSV, Flu A&B, Covid) Anterior Nasal Swab     Status: None   Collection Time: 11/04/23   6:27 PM   Specimen: Anterior Nasal Swab  Result Value Ref Range Status   SARS Coronavirus 2 by RT PCR NEGATIVE NEGATIVE Final    Comment: (NOTE) SARS-CoV-2 target nucleic acids are NOT DETECTED.  The SARS-CoV-2 RNA is generally detectable in upper respiratory specimens during the acute phase of infection. The lowest concentration of SARS-CoV-2 viral copies this assay can detect is 138 copies/mL. A negative result does not preclude SARS-Cov-2 infection and should not be used as the sole basis for treatment or other patient management decisions. A negative result may occur with  improper specimen collection/handling, submission of specimen other than nasopharyngeal swab, presence of viral mutation(s) within the areas targeted by this assay, and inadequate number of viral copies(<138 copies/mL). A negative result must be combined with clinical observations, patient history, and epidemiological information. The expected result is Negative.  Fact Sheet for Patients:  BloggerCourse.com  Fact Sheet for Healthcare Providers:  SeriousBroker.it  This test is no t yet approved or cleared by the [place] FDA and  has been authorized for detection and/or diagnosis of SARS-CoV-2 by FDA under an Emergency Use Authorization (EUA). This EUA will remain  in effect (meaning this test can be used) for the duration of the COVID-19 declaration under Section 564(b)(1) of the Act, 21 U.S.C.section 360bbb-3(b)(1), unless the authorization is terminated  or revoked sooner.       Influenza A by PCR NEGATIVE NEGATIVE Final   Influenza B by PCR NEGATIVE NEGATIVE Final    Comment: (NOTE) The Xpert Xpress SARS-CoV-2/FLU/RSV plus assay is intended as an aid in the diagnosis of influenza from Nasopharyngeal swab specimens and should not be used as a sole basis for treatment. Nasal washings and aspirates are unacceptable for Xpert Xpress  SARS-CoV-2/FLU/RSV testing.  Fact Sheet for Patients: BloggerCourse.com  Fact Sheet for Healthcare Providers: SeriousBroker.it  This test is not yet  approved or cleared by the [place] and has been authorized for detection and/or diagnosis of SARS-CoV-2 by FDA under an Emergency Use Authorization (EUA). This EUA will remain in effect (meaning this test can be used) for the duration of the COVID-19 declaration under Section 564(b)(1) of the Act, 21 U.S.C. section 360bbb-3(b)(1), unless the authorization is terminated or revoked.     Resp Syncytial Virus by PCR NEGATIVE NEGATIVE Final    Comment: (NOTE) Fact Sheet for Patients: BloggerCourse.com  Fact Sheet for Healthcare Providers: SeriousBroker.it  This test is not yet approved or cleared by the [place] FDA and has been authorized for detection and/or diagnosis of SARS-CoV-2 by FDA under an Emergency Use Authorization (EUA). This EUA will remain in effect (meaning this test can be used) for the duration of the COVID-19 declaration under Section 564(b)(1) of the Act, 21 U.S.C. section 360bbb-3(b)(1), unless the authorization is terminated or revoked.  Performed at [facility], [address] [place], [place] [address]   Respiratory (~20 pathogens) panel by PCR     Status: None   Collection Time: 11/04/23  6:27 PM   Specimen: Nasopharyngeal Swab; Respiratory  Result Value Ref Range Status   Adenovirus NOT DETECTED NOT DETECTED Final   Coronavirus 229E NOT DETECTED NOT DETECTED Final    Comment: (NOTE) The Coronavirus on the Respiratory Panel, DOES NOT test for the novel  Coronavirus (2019 nCoV)    Coronavirus HKU1 NOT DETECTED NOT DETECTED Final   Coronavirus NL63 NOT DETECTED NOT DETECTED Final   Coronavirus OC43 NOT DETECTED NOT DETECTED Final   Metapneumovirus NOT DETECTED NOT  DETECTED Final   Rhinovirus / Enterovirus NOT DETECTED NOT DETECTED Final   Influenza A NOT DETECTED NOT DETECTED Final   Influenza B NOT DETECTED NOT DETECTED Final   Parainfluenza Virus 1 NOT DETECTED NOT DETECTED Final   Parainfluenza Virus 2 NOT DETECTED NOT DETECTED Final   Parainfluenza Virus 3 NOT DETECTED NOT DETECTED Final   Parainfluenza Virus 4 NOT DETECTED NOT DETECTED Final   Respiratory Syncytial Virus NOT DETECTED NOT DETECTED Final   Bordetella pertussis NOT DETECTED NOT DETECTED Final   Bordetella Parapertussis NOT DETECTED NOT DETECTED Final   Chlamydophila pneumoniae NOT DETECTED NOT DETECTED Final   Mycoplasma pneumoniae NOT DETECTED NOT DETECTED Final    Comment: Performed at [facility], [address] [place], [place] [address]  Blood culture (routine x 2)     Status: None   Collection Time: 11/04/23 10:26 PM   Specimen: BLOOD  Result Value Ref Range Status   Specimen Description   Final    BLOOD Performed at [facility], [address], [place], [place] [address]    Special Requests   Final    BOTTLES DRAWN AEROBIC AND ANAEROBIC Blood Culture adequate volume Performed at [facility], [address], [place], [place] [address]    Culture   Final    NO GROWTH 5 DAYS Performed at [facility], [address], [place], [place] [address]    Report Status 11/10/2023 FINAL  Final  Blood culture (routine x 2)     Status: None   Collection Time: 11/05/23  8:55 AM   Specimen: BLOOD  Result Value Ref Range Status   Specimen Description   Final    BLOOD BLOOD LEFT HAND Performed at [facility], [address], [place], [place] [address]    Special Requests   Final  BOTTLES DRAWN AEROBIC AND ANAEROBIC Blood Culture adequate volume Performed at [facility], [address] [place], [place] [address]    Culture   Final    NO GROWTH 5 DAYS Performed at [facility], [address] [place], [place] [address]    Report Status 11/10/2023 FINAL  Final     Labs: BNP (last 3 results) No results for input(s): "BNP" in the last 8760 hours. Basic Metabolic Panel: Recent Labs  Lab 11/05/23 0855 11/06/23 0438 11/07/23 0429 11/08/23 1043 11/09/23 0953  NA 131* 139 137 139 138  K 3.3* 3.5 3.1* 3.3* 3.5  CL 94* 99 99 100 102  CO2 25 26 27 25 26   GLUCOSE 107* 88 97 135* 91  BUN 8 5* <5* 7 9  CREATININE 0.47 0.49 0.45 0.42* 0.40*  CALCIUM 9.0 9.2 8.6* 9.2 9.1   Liver Function Tests: Recent Labs  Lab 11/04/23 2226 11/05/23 0855 11/07/23 0429 11/08/23 1043 11/09/23 0953  AST 50* 45* 76* 48* 77*  ALT 50* 41 53* 46* 54*  ALKPHOS 79 73 87 90 83  BILITOT 1.2 1.2 0.5 0.7 0.7  PROT 8.6* 7.7 7.4 8.1 7.8  ALBUMIN 3.9 3.6 3.1* 3.5 3.2*   No results for input(s): "LIPASE", "AMYLASE" in the last 168 hours. No results for input(s): "AMMONIA" in the last 168 hours. CBC: Recent Labs  Lab 11/04/23 1827 11/05/23 0855 11/06/23 0438 11/07/23 0429 11/08/23 1043 11/09/23 0953  WBC 18.2* 17.1* 16.9* 14.8* 11.6* 6.9  NEUTROABS 14.9*  --   --   --   --   --   HGB 15.7* 13.0 13.6 12.5 13.4 12.7  HCT 47.1* 37.7 42.7 39.4 42.5 41.0  MCV 94.6 94.5 100.2* 99.0 98.8 101.2*  PLT 516* 435* 487* 474* 600* 558*   Cardiac Enzymes: Recent Labs  Lab 11/04/23 1827  CKTOTAL 42   BNP: Invalid input(s): "POCBNP" CBG: No results for input(s): "GLUCAP" in the last 168 hours. D-Dimer No results for input(s): "DDIMER" in the last 72 hours. Hgb A1c No results for input(s): "HGBA1C" in the last 72 hours. Lipid Profile No results for input(s): "CHOL", "HDL", "LDLCALC", "TRIG", "CHOLHDL", "LDLDIRECT" in the last 72 hours. Thyroid function studies No results for input(s): "TSH", "T4TOTAL", "T3FREE", "THYROIDAB" in the last 72 hours.  Invalid input(s): "FREET3" Anemia work up No results for input(s): "VITAMINB12", "FOLATE", "FERRITIN", "TIBC", "IRON",  "RETICCTPCT" in the last 72 hours. Urinalysis    Component Value Date/Time   COLORURINE YELLOW 10/29/2023 1417   APPEARANCEUR CLEAR 10/29/2023 1417   LABSPEC 1.012 10/29/2023 1417   PHURINE 8.0 10/29/2023 1417   GLUCOSEU NEGATIVE 10/29/2023 1417   GLUCOSEU NEGATIVE 08/17/2023 1354   HGBUR SMALL (A) 10/29/2023 1417   BILIRUBINUR NEGATIVE 10/29/2023 1417   KETONESUR NEGATIVE 10/29/2023 1417   PROTEINUR 30 (A) 10/29/2023 1417   UROBILINOGEN 1.0 08/17/2023 1354   NITRITE NEGATIVE 10/29/2023 1417   LEUKOCYTESUR NEGATIVE 10/29/2023 1417   Sepsis Labs Recent Labs  Lab 11/06/23 0438 11/07/23 0429 11/08/23 1043 11/09/23 0953  WBC 16.9* 14.8* 11.6* 6.9   Microbiology Recent Results (from the past 240 hours)  Resp panel by RT-PCR (RSV, Flu A&B, Covid) Anterior Nasal Swab     Status: None   Collection Time: 11/04/23  6:27 PM   Specimen: Anterior Nasal Swab  Result Value Ref Range Status   SARS Coronavirus 2 by RT PCR NEGATIVE NEGATIVE Final    Comment: (NOTE) SARS-CoV-2 target nucleic acids are NOT DETECTED.  The SARS-CoV-2 RNA is generally detectable in upper respiratory specimens during the acute phase of infection. The lowest concentration of SARS-CoV-2 viral copies this assay can detect is 138 copies/mL. A negative result does not preclude SARS-Cov-2 infection and should not be used as the sole basis for treatment or other patient management decisions. A negative result may occur with  improper specimen collection/handling, submission of specimen other than nasopharyngeal swab, presence of viral mutation(s) within the areas targeted by this assay, and inadequate number of viral copies(<138 copies/mL). A negative result must be combined with clinical observations, patient history, and epidemiological information. The expected result is Negative.  Fact Sheet for Patients:  BloggerCourse.com  Fact Sheet for Healthcare Providers:   SeriousBroker.it  This test is no t yet approved or cleared by the [place] FDA and  has been authorized for detection and/or diagnosis of SARS-CoV-2 by FDA under an Emergency Use Authorization (EUA). This EUA will remain  in effect (meaning this test can be used) for the duration of the COVID-19 declaration under Section 564(b)(1) of the Act, 21 U.S.C.section 360bbb-3(b)(1), unless the authorization is terminated  or revoked sooner.       Influenza A by PCR NEGATIVE NEGATIVE Final   Influenza B by PCR NEGATIVE NEGATIVE Final    Comment: (NOTE) The Xpert Xpress SARS-CoV-2/FLU/RSV plus assay is intended as an aid in the diagnosis of influenza from Nasopharyngeal swab specimens and should not be used as a sole basis for treatment. Nasal washings and aspirates are unacceptable for Xpert Xpress SARS-CoV-2/FLU/RSV testing.  Fact Sheet for Patients: BloggerCourse.com  Fact Sheet for Healthcare Providers: SeriousBroker.it  This test is not yet approved or cleared by the [place] FDA and has been authorized for detection and/or diagnosis of SARS-CoV-2 by FDA under an Emergency Use Authorization (EUA). This EUA will remain in effect (meaning this test can be used) for the duration of the COVID-19 declaration under Section 564(b)(1) of the Act, 21 U.S.C. section 360bbb-3(b)(1), unless the authorization is terminated or revoked.     Resp Syncytial Virus by PCR NEGATIVE NEGATIVE Final    Comment: (NOTE) Fact Sheet for Patients: BloggerCourse.com  Fact Sheet for Healthcare Providers: SeriousBroker.it  This test is not yet approved or cleared by the [place] FDA and has been authorized for detection and/or diagnosis of SARS-CoV-2 by FDA under an Emergency Use Authorization (EUA). This EUA will remain in effect (meaning this test can be used) for  the duration of the COVID-19 declaration under Section 564(b)(1) of the Act, 21 U.S.C. section 360bbb-3(b)(1), unless the authorization is terminated or revoked.  Performed at [facility], [address], [place], [place] [address]   Respiratory (~20 pathogens) panel by PCR     Status: None   Collection Time: 11/04/23  6:27 PM   Specimen: Nasopharyngeal Swab; Respiratory  Result Value Ref Range Status   Adenovirus NOT DETECTED NOT DETECTED Final   Coronavirus 229E NOT DETECTED NOT DETECTED Final    Comment: (NOTE) The Coronavirus on the Respiratory Panel, DOES NOT test for the novel  Coronavirus (2019 nCoV)    Coronavirus HKU1 NOT DETECTED NOT DETECTED Final   Coronavirus NL63 NOT DETECTED NOT DETECTED Final   Coronavirus OC43 NOT DETECTED NOT DETECTED Final   Metapneumovirus NOT DETECTED NOT DETECTED Final   Rhinovirus / Enterovirus NOT DETECTED NOT DETECTED Final   Influenza A NOT DETECTED NOT DETECTED Final   Influenza B NOT DETECTED NOT DETECTED Final   Parainfluenza Virus 1 NOT  DETECTED NOT DETECTED Final   Parainfluenza Virus 2 NOT DETECTED NOT DETECTED Final   Parainfluenza Virus 3 NOT DETECTED NOT DETECTED Final   Parainfluenza Virus 4 NOT DETECTED NOT DETECTED Final   Respiratory Syncytial Virus NOT DETECTED NOT DETECTED Final   Bordetella pertussis NOT DETECTED NOT DETECTED Final   Bordetella Parapertussis NOT DETECTED NOT DETECTED Final   Chlamydophila pneumoniae NOT DETECTED NOT DETECTED Final   Mycoplasma pneumoniae NOT DETECTED NOT DETECTED Final    Comment: Performed at [facility], [address] [place], [place] [address]  Blood culture (routine x 2)     Status: None   Collection Time: 11/04/23 10:26 PM   Specimen: BLOOD  Result Value Ref Range Status   Specimen Description   Final    BLOOD Performed at [facility], [address] [place], [place] [address]    Special Requests   Final    BOTTLES DRAWN  AEROBIC AND ANAEROBIC Blood Culture adequate volume Performed at [facility], [address] [place], [place] [address]    Culture   Final    NO GROWTH 5 DAYS Performed at [facility], [address], [place], [place] [address]    Report Status 11/10/2023 FINAL  Final  Blood culture (routine x 2)     Status: None   Collection Time: 11/05/23  8:55 AM   Specimen: BLOOD  Result Value Ref Range Status   Specimen Description   Final    BLOOD BLOOD LEFT HAND Performed at [facility] [place], [address], [place], [place] [address]    Special Requests   Final    BOTTLES DRAWN AEROBIC AND ANAEROBIC Blood Culture adequate volume Performed at [facility], [address] [place], [place] [address]    Culture   Final    NO GROWTH 5 DAYS Performed at [facility], [address] [place], [place] [address]    Report Status 11/10/2023 FINAL  Final     Time coordinating discharge: Over 30 minutes  SIGNED:   Azucena Fallen, DO [facility] 11/10/2023, 5:25 PM Pager   If 7PM-7AM, please contact night-coverage [email or web address]

## 2023-11-17 NOTE — Progress Notes (Unsigned)
   Assessment & Plan:  There are no diagnoses linked to this encounter.   Return precautions given.   Risks, benefits, and alternatives of the medications and treatment plan prescribed today were discussed, and patient expressed understanding.   Education regarding symptom management and diagnosis given to patient on AVS either electronically or printed.  No follow-ups on file.  Rennie Plowman, FNP  Subjective:    Patient ID: Judith Drake, female    DOB: 30-May-1971, 53 y.o.   MRN: [id number]  CC: Judith Drake is a 53 y.o. female who presents today for follow up.   HPI: Discuss Hep B vaccine status      Allergies: Amoxicillin, Dilaudid [hydromorphone hcl], and Morphine and codeine Current Outpatient Medications on File Prior to Visit  Medication Sig Dispense Refill   amLODipine (NORVASC) 10 MG tablet Take 1 tablet (10 mg total) by mouth daily. 90 tablet 3   ascorbic acid (VITAMIN C) 500 MG tablet Take 500-1,000 mg by mouth daily.     Aspirin-Salicylamide-Caffeine (BC HEADACHE POWDER PO) Take 1 packet by mouth 2 (two) times daily as needed (for pain, headaches, or discomfort).     cetirizine (ZYRTEC) 10 MG tablet Take 1 tablet (10 mg total) by mouth daily. (Patient taking differently: Take 10 mg by mouth daily as needed for allergies or rhinitis.) 30 tablet 0   DULoxetine (CYMBALTA) 30 MG capsule Take 2 capsules (60 mg total) by mouth daily. 90 capsule 1   ferrous sulfate 325 (65 FE) MG EC tablet Take 1 tablet (325 mg total) by mouth daily with breakfast. 90 tablet 0   omeprazole (PRILOSEC) 20 MG capsule Take 1 capsule (20 mg total) by mouth daily. (Patient taking differently: Take 20 mg by mouth daily before breakfast.) 90 capsule 1   ondansetron (ZOFRAN) 4 MG tablet Take 1 tablet (4 mg total) by mouth every 8 (eight) hours as needed for nausea or vomiting. 12 tablet 0   predniSONE (DELTASONE) 10 MG tablet Take 3 tablets (30 mg total) by mouth daily with breakfast for 2  days, THEN 2 tablets (20 mg total) daily with breakfast for 2 days, THEN 1 tablet (10 mg total) daily with breakfast for 2 days. 12 tablet 0   No current facility-administered medications on file prior to visit.    Review of Systems    Objective:    LMP  (LMP Unknown)  BP Readings from Last 3 Encounters:  11/10/23 134/84  11/04/23 (!) 130/98  10/30/23 133/87   Wt Readings from Last 3 Encounters:  11/05/23 163 lb 5.8 oz (74.1 kg)  11/04/23 162 lb (73.5 kg)  10/28/23 165 lb 11.2 oz (75.2 kg)    Physical Exam

## 2023-11-18 ENCOUNTER — Ambulatory Visit (INDEPENDENT_AMBULATORY_CARE_PROVIDER_SITE_OTHER): Admitting: Family

## 2023-11-18 ENCOUNTER — Encounter: Payer: Self-pay | Admitting: Family

## 2023-11-18 VITALS — BP 128/86 | HR 99 | Temp 98.4°F | Ht 64.0 in | Wt 158.8 lb

## 2023-11-18 DIAGNOSIS — K760 Fatty (change of) liver, not elsewhere classified: Secondary | ICD-10-CM

## 2023-11-18 DIAGNOSIS — M130 Polyarthritis, unspecified: Secondary | ICD-10-CM

## 2023-11-18 LAB — TSH: TSH: 1.66 u[IU]/mL (ref 0.35–5.50)

## 2023-11-18 LAB — C-REACTIVE PROTEIN: CRP: 4.9 mg/dL (ref 0.5–20.0)

## 2023-11-18 LAB — CBC WITH DIFFERENTIAL/PLATELET
Basophils Absolute: 0.1 10*3/uL (ref 0.0–0.1)
Basophils Relative: 0.9 % (ref 0.0–3.0)
Eosinophils Absolute: 0.2 10*3/uL (ref 0.0–0.7)
Eosinophils Relative: 1.9 % (ref 0.0–5.0)
HCT: 42.9 % (ref 36.0–46.0)
Hemoglobin: 14.3 g/dL (ref 12.0–15.0)
Lymphocytes Relative: 26.7 % (ref 12.0–46.0)
Lymphs Abs: 2.9 10*3/uL (ref 0.7–4.0)
MCHC: 33.3 g/dL (ref 30.0–36.0)
MCV: 94.9 fl (ref 78.0–100.0)
Monocytes Absolute: 0.9 10*3/uL (ref 0.1–1.0)
Monocytes Relative: 8.2 % (ref 3.0–12.0)
Neutro Abs: 6.7 10*3/uL (ref 1.4–7.7)
Neutrophils Relative %: 62.3 % (ref 43.0–77.0)
Platelets: 587 10*3/uL — ABNORMAL HIGH (ref 150.0–400.0)
RBC: 4.52 Mil/uL (ref 3.87–5.11)
RDW: 14.6 % (ref 11.5–15.5)
WBC: 10.7 10*3/uL — ABNORMAL HIGH (ref 4.0–10.5)

## 2023-11-18 LAB — COMPREHENSIVE METABOLIC PANEL
ALT: 41 U/L — ABNORMAL HIGH (ref 0–35)
AST: 35 U/L (ref 0–37)
Albumin: 4.5 g/dL (ref 3.5–5.2)
Alkaline Phosphatase: 89 U/L (ref 39–117)
BUN: 5 mg/dL — ABNORMAL LOW (ref 6–23)
CO2: 34 meq/L — ABNORMAL HIGH (ref 19–32)
Calcium: 9.7 mg/dL (ref 8.4–10.5)
Chloride: 94 meq/L — ABNORMAL LOW (ref 96–112)
Creatinine, Ser: 0.58 mg/dL (ref 0.40–1.20)
GFR: 104.7 mL/min (ref >60.00)
Glucose, Bld: 104 mg/dL — ABNORMAL HIGH (ref 70–99)
Potassium: 3.9 meq/L (ref 3.5–5.1)
Sodium: 138 meq/L (ref 135–145)
Total Bilirubin: 0.4 mg/dL (ref 0.2–1.2)
Total Protein: 8 g/dL (ref 6.0–8.3)

## 2023-11-18 MED ORDER — PREDNISONE 10 MG PO TABS
ORAL_TABLET | ORAL | 0 refills | Status: DC
Start: 2023-11-18 — End: 2023-12-03

## 2023-11-18 MED ORDER — MELOXICAM 7.5 MG PO TABS: 7.5000 mg | ORAL_TABLET | Freq: Every day | ORAL | 2 refills | Status: DC | PRN

## 2023-11-18 NOTE — Patient Instructions (Addendum)
 You are safe to resume sexual activity.  We will recheck your separate sites titers at follow-up   I would advise the Twinrix vaccine to prevent hepatitis A and hepatitis B.  Primary vaccination with Twinrix involves three doses administered over a 8-month schedule.  The first dose  1/3 is followed by vaccine 2/3 one month after,  followed by 3/3 vaccine 6 months from initial.   I recommend shingrex however please have this vaccine done at local pharmacy.   Start prednisone taper.  Ensure to get all doses and prior to noon as prednisone can interfere with sleep.  Please start meloxicam  A couple of points in regards to meloxicam ( Mobic) -  This medication is not intended for daily , long term use. It is a potent anti inflammatory ( NSAID), and my intention is for you take as needed for moderate to severe pain. If you find yourself using daily, please let me know.   Please takes Mobic ( meloxicam) with FOOD since it is an anti-inflammatory as it can cause a GI bleed or ulcer. If you have a history of GI bleed or ulcer, please do NOT take.  Do no take over the counter aleve, motrin, advil, goody's powder for pain as they are also NSAIDs, and they are  in the same class as Mobic  Lastly, we will need to monitor kidney function while on Mobic, and if we were to see any decline in kidney function in the future, we would have to discontinue this medication.

## 2023-11-18 NOTE — Assessment & Plan Note (Signed)
 Fibrosis 4 Score = .96 (Low risk)        Interpretation for patients with NAFLD          <1.30       -  F0-F1 (Low risk)          1.30-2.67 -  Indeterminate           >2.67      -  F3-F4 (High risk)     Validated for ages 75-65       Seen CT a/p 09/2019. Current elevation of LFTs. Advised no alcohol, acetaminophen.  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  Advised safer to proceed with Twinrix vaccine.  She will schedule this in a couple months once joint pain has improved.

## 2023-11-18 NOTE — Assessment & Plan Note (Addendum)
 Subacute on chronic. Afebrile. Right ankle edema subtle worsening after completion of prednisone.   Start prednisone taper.  Start Meloxicam 7.5 to 15 mg to use as needed for known osteoarthritis.  Advised to continue omeprazole to prevent gastritis.   will await rheumatology consult.  Pending repeat labs   Of note: Patient heart rate improved as she rested in exam room.  Discussed tachycardia in setting of pain, prednisone use.  In the absence of symptoms, we jointly agreed to occur further testing at this time.

## 2023-11-24 ENCOUNTER — Encounter: Payer: Self-pay | Admitting: Family

## 2023-11-29 ENCOUNTER — Telehealth: Payer: Self-pay

## 2023-11-29 NOTE — Telephone Encounter (Signed)
 Spoke to pt to offer appt in office she declined due to iut being hard for her to get here  but would like to know what you suggest she do now

## 2023-11-29 NOTE — Telephone Encounter (Signed)
 Copied from CRM [id number]. Topic: Clinical - Medical Advice >> Nov 29, 2023 11:36 AM Fredrich Romans wrote: Reason for CRM: Patient called in stating that she was being treated for inflammation all over her body with prednisone,which helped it go away ,however the inflammation is back mainly in her knees ,elbows,and feet,and its very painful to walk. She would like to know what would the provider suggest she do now?

## 2023-11-30 NOTE — Telephone Encounter (Signed)
 Spoke to pt she stated that her appt is not until 01/13/24 @ [facility] Dr Kathi Ludwig [phone]. Pt is taking the 15 mg Mobic and she is taking the Prilosec daily also. I informed her that I would reach out to them and see if they can possibly see her sooner and let her know after I speak to them

## 2023-11-30 NOTE — Telephone Encounter (Signed)
 Spoke to [facility] @ Dr Kathi Ludwig office [phone]. She stated that they do not have any earlier appts because Dr Kathi Ludwig is about to be out of the office for 2-3 weeks I asked if she could see someone else she stated that they do not switch pts in the office

## 2023-11-30 NOTE — Telephone Encounter (Signed)
 Call pt  Please confirm when her appt is with rheumatology and get office name, phone   Please call and ask to speak with rheumatology in regards to moving appointment up  Please speak with nurse and ask to speak with provider if another course of prednisone if appropriate  Please ask if she is takng mobic 7.5 or 15mg  dose daily?  She wil need to take Prilosec daily if on mobic.

## 2023-12-01 NOTE — Telephone Encounter (Signed)
 Spoke to [facility] @ Dr Kathi Ludwig office [phone] discussed the message below with her  she stated that she would give Dr Kathi Ludwig the message to reach out to you in regards to message below Your name an cell number to her

## 2023-12-03 ENCOUNTER — Other Ambulatory Visit: Payer: Self-pay | Admitting: Family

## 2023-12-03 DIAGNOSIS — M2550 Pain in unspecified joint: Secondary | ICD-10-CM

## 2023-12-03 DIAGNOSIS — E119 Type 2 diabetes mellitus without complications: Secondary | ICD-10-CM

## 2023-12-03 MED ORDER — PREDNISONE 10 MG PO TABS: 10.0000 mg | ORAL_TABLET | Freq: Every day | ORAL | 0 refills | Status: AC

## 2023-12-10 NOTE — Telephone Encounter (Signed)
 Spoke to pt she was at hospital with husband will call and schedule as soon as she can

## 2023-12-13 NOTE — Telephone Encounter (Signed)
 Pt has appt scheduled for 02/10/24 earliest she can come

## 2023-12-18 ENCOUNTER — Emergency Department (HOSPITAL_COMMUNITY)

## 2023-12-18 ENCOUNTER — Other Ambulatory Visit: Payer: Self-pay

## 2023-12-18 ENCOUNTER — Encounter (HOSPITAL_COMMUNITY): Payer: Self-pay

## 2023-12-18 ENCOUNTER — Emergency Department (HOSPITAL_COMMUNITY)
Admission: EM | Admit: 2023-12-18 | Discharge: 2023-12-19 | Disposition: A | Discharge status: Home / Self Care | Attending: Emergency Medicine | Admitting: Emergency Medicine

## 2023-12-18 DIAGNOSIS — S0990XA Unspecified injury of head, initial encounter: Secondary | ICD-10-CM | POA: Diagnosis not present

## 2023-12-18 DIAGNOSIS — M545 Low back pain, unspecified: Secondary | ICD-10-CM | POA: Insufficient documentation

## 2023-12-18 DIAGNOSIS — I700 Atherosclerosis of aorta: Secondary | ICD-10-CM | POA: Diagnosis not present

## 2023-12-18 DIAGNOSIS — K5730 Diverticulosis of large intestine without perforation or abscess without bleeding: Secondary | ICD-10-CM | POA: Diagnosis not present

## 2023-12-18 DIAGNOSIS — Z79899 Other long term (current) drug therapy: Secondary | ICD-10-CM | POA: Diagnosis not present

## 2023-12-18 DIAGNOSIS — Z7982 Long term (current) use of aspirin: Secondary | ICD-10-CM | POA: Diagnosis not present

## 2023-12-18 DIAGNOSIS — E876 Hypokalemia: Secondary | ICD-10-CM | POA: Diagnosis not present

## 2023-12-18 DIAGNOSIS — Y908 Blood alcohol level of 240 mg/100 ml or more: Secondary | ICD-10-CM | POA: Insufficient documentation

## 2023-12-18 DIAGNOSIS — M47814 Spondylosis without myelopathy or radiculopathy, thoracic region: Secondary | ICD-10-CM | POA: Diagnosis not present

## 2023-12-18 DIAGNOSIS — Z23 Encounter for immunization: Secondary | ICD-10-CM | POA: Insufficient documentation

## 2023-12-18 DIAGNOSIS — R609 Edema, unspecified: Secondary | ICD-10-CM | POA: Diagnosis not present

## 2023-12-18 DIAGNOSIS — M7989 Other specified soft tissue disorders: Secondary | ICD-10-CM | POA: Diagnosis not present

## 2023-12-18 DIAGNOSIS — M47816 Spondylosis without myelopathy or radiculopathy, lumbar region: Secondary | ICD-10-CM | POA: Diagnosis not present

## 2023-12-18 DIAGNOSIS — S3991XA Unspecified injury of abdomen, initial encounter: Secondary | ICD-10-CM | POA: Diagnosis not present

## 2023-12-18 DIAGNOSIS — W19XXXA Unspecified fall, initial encounter: Secondary | ICD-10-CM | POA: Diagnosis not present

## 2023-12-18 DIAGNOSIS — I10 Essential (primary) hypertension: Secondary | ICD-10-CM | POA: Diagnosis not present

## 2023-12-18 DIAGNOSIS — F10929 Alcohol use, unspecified with intoxication, unspecified: Secondary | ICD-10-CM | POA: Diagnosis not present

## 2023-12-18 DIAGNOSIS — F10120 Alcohol abuse with intoxication, uncomplicated: Secondary | ICD-10-CM | POA: Diagnosis not present

## 2023-12-18 DIAGNOSIS — F10129 Alcohol abuse with intoxication, unspecified: Secondary | ICD-10-CM | POA: Diagnosis not present

## 2023-12-18 DIAGNOSIS — M25522 Pain in left elbow: Secondary | ICD-10-CM | POA: Diagnosis not present

## 2023-12-18 DIAGNOSIS — S299XXA Unspecified injury of thorax, initial encounter: Secondary | ICD-10-CM | POA: Diagnosis not present

## 2023-12-18 DIAGNOSIS — M19022 Primary osteoarthritis, left elbow: Secondary | ICD-10-CM | POA: Diagnosis not present

## 2023-12-18 DIAGNOSIS — Z043 Encounter for examination and observation following other accident: Secondary | ICD-10-CM | POA: Diagnosis not present

## 2023-12-18 DIAGNOSIS — R519 Headache, unspecified: Secondary | ICD-10-CM | POA: Diagnosis not present

## 2023-12-18 DIAGNOSIS — S2243XA Multiple fractures of ribs, bilateral, initial encounter for closed fracture: Secondary | ICD-10-CM | POA: Diagnosis not present

## 2023-12-18 DIAGNOSIS — F10920 Alcohol use, unspecified with intoxication, uncomplicated: Secondary | ICD-10-CM

## 2023-12-18 DIAGNOSIS — S0083XA Contusion of other part of head, initial encounter: Secondary | ICD-10-CM | POA: Insufficient documentation

## 2023-12-18 DIAGNOSIS — W0110XA Fall on same level from slipping, tripping and stumbling with subsequent striking against unspecified object, initial encounter: Secondary | ICD-10-CM | POA: Insufficient documentation

## 2023-12-18 LAB — CBC WITH DIFFERENTIAL/PLATELET
Abs Immature Granulocytes: 0.03 10*3/uL (ref 0.00–0.07)
Basophils Absolute: 0.1 10*3/uL (ref 0.0–0.1)
Basophils Relative: 1 %
Eosinophils Absolute: 0.1 10*3/uL (ref 0.0–0.5)
Eosinophils Relative: 1 %
HCT: 42.1 % (ref 36.0–46.0)
Hemoglobin: 14.6 g/dL (ref 12.0–15.0)
Immature Granulocytes: 0 %
Lymphocytes Relative: 25 %
Lymphs Abs: 2.4 10*3/uL (ref 0.7–4.0)
MCH: 32.7 pg (ref 26.0–34.0)
MCHC: 34.7 g/dL (ref 30.0–36.0)
MCV: 94.4 fL (ref 80.0–100.0)
Monocytes Absolute: 0.3 10*3/uL (ref 0.1–1.0)
Monocytes Relative: 3 %
Neutro Abs: 6.8 10*3/uL (ref 1.7–7.7)
Neutrophils Relative %: 70 %
Platelets: 448 10*3/uL — ABNORMAL HIGH (ref 150–400)
RBC: 4.46 MIL/uL (ref 3.87–5.11)
RDW: 15.1 % (ref 11.5–15.5)
WBC: 9.7 10*3/uL (ref 4.0–10.5)
nRBC: 0 % (ref 0.0–0.2)

## 2023-12-18 LAB — URINALYSIS, ROUTINE W REFLEX MICROSCOPIC
Bilirubin Urine: NEGATIVE
Glucose, UA: NEGATIVE mg/dL
Hgb urine dipstick: NEGATIVE
Ketones, ur: NEGATIVE mg/dL
Leukocytes,Ua: NEGATIVE
Nitrite: NEGATIVE
Protein, ur: NEGATIVE mg/dL
Specific Gravity, Urine: 1.002 — ABNORMAL LOW (ref 1.005–1.030)
pH: 6 (ref 5.0–8.0)

## 2023-12-18 LAB — RAPID URINE DRUG SCREEN, HOSP PERFORMED
Amphetamines: NOT DETECTED
Barbiturates: NOT DETECTED
Benzodiazepines: NOT DETECTED
Cocaine: NOT DETECTED
Opiates: NOT DETECTED
Tetrahydrocannabinol: NOT DETECTED

## 2023-12-18 MED ORDER — TETANUS-DIPHTH-ACELL PERTUSSIS 5-2.5-18.5 LF-MCG/0.5 IM SUSY
0.5000 mL | PREFILLED_SYRINGE | Freq: Once | INTRAMUSCULAR | Status: AC
  Administered 2023-12-18: 0.5 mL via INTRAMUSCULAR
  Filled 2023-12-18: qty 0.5

## 2023-12-18 NOTE — ED Notes (Addendum)
 Patient returned from X-ray

## 2023-12-18 NOTE — ED Triage Notes (Signed)
 Patient coming from home with chief complaint of a fall. Fall was unwitnessed with unknown LOC. Patient had facial abrasions and bruising. Has chronic hip and back pain along with chronic ETOH. EMS stated patient was Aox2 at scene but has since became Aox4. No N/V or SHOB. Patient is not taking blood thinners. EMS VS 109 CBG 132/74 BP 88 HR

## 2023-12-18 NOTE — ED Notes (Signed)
Patient was able to ambulate to the restroom.

## 2023-12-18 NOTE — ED Notes (Addendum)
 Patient transported to X-ray

## 2023-12-18 NOTE — ED Provider Notes (Signed)
 [place] EMERGENCY DEPARTMENT AT [facility] Provider Note   CSN: [id number] Arrival date & time: 12/18/23  2255     History {Add pertinent medical, surgical, social history, OB history to HPI:1} Chief Complaint  Patient presents with   Judith Drake is a 53 y.o. female.  Level 5 caveat for intoxication.  Patient presents via EMS after unwitnessed fall.  She does not recall falling.  EMS reports she fell striking her face and sustained abrasions to her eyebrow and right cheek.  Patient states she was drinking heavily tonight because of chronic pain to her neck and her back.  Does not take any blood thinners.  Complains of pain to her face and head, neck and back.  She believes the pain in her neck and back is chronic.  No new weakness, numbness or tingling.  No bowel or bladder incontinence.  No chest pain or shortness of breath.  No abdominal pain, vomiting but does have nausea.  No visual changes.  history of hypertension, GERD, chronic anxiety/depression, recently diagnosed syphilis for which she completed 28 days of doxycycline Patient recently hospitalized about a month ago for polyarthralgias and fever.  Her workup was fairly unremarkable and no infectious pathology was identified for her arthropathies.  The history is provided by the patient and the EMS personnel.  Fall Associated symptoms include headaches. Pertinent negatives include no chest pain, no abdominal pain and no shortness of breath.       Home Medications Prior to Admission medications   Medication Sig Start Date End Date Taking? Authorizing Provider  amLODipine (NORVASC) 10 MG tablet Take 1 tablet (10 mg total) by mouth daily. 08/10/23   Bluford Burkitt, NP  ascorbic acid (VITAMIN C) 500 MG tablet Take 500-1,000 mg by mouth daily.    [provider]  Aspirin-Salicylamide-Caffeine (BC HEADACHE POWDER PO) Take 1 packet by mouth 2 (two) times daily as needed (for pain, headaches, or  discomfort).    [provider]  cetirizine (ZYRTEC) 10 MG tablet Take 1 tablet (10 mg total) by mouth daily. Patient taking differently: Take 10 mg by mouth daily as needed for allergies or rhinitis. 07/27/23   Stephany Ehrich, MD  DULoxetine (CYMBALTA) 30 MG capsule Take 2 capsules (60 mg total) by mouth daily. 09/23/23   Calista Catching, FNP  ferrous sulfate 325 (65 FE) MG EC tablet Take 1 tablet (325 mg total) by mouth daily with breakfast. 08/17/23   Calista Catching, FNP  meloxicam (MOBIC) 7.5 MG tablet Take 1-2 tablets (7.5-15 mg total) by mouth daily as needed for pain. 11/18/23   Calista Catching, FNP  omeprazole (PRILOSEC) 20 MG capsule Take 1 capsule (20 mg total) by mouth daily. Patient taking differently: Take 20 mg by mouth daily before breakfast. 08/10/23   Bluford Burkitt, NP  ondansetron (ZOFRAN) 4 MG tablet Take 1 tablet (4 mg total) by mouth every 8 (eight) hours as needed for nausea or vomiting. 10/30/23   Etter Hermann., MD      Allergies    Amoxicillin, Dilaudid [hydromorphone hcl], and Morphine and codeine    Review of Systems   Review of Systems  Constitutional:  Negative for activity change and appetite change.  HENT:  Negative for congestion and rhinorrhea.   Respiratory:  Negative for cough and shortness of breath.   Cardiovascular:  Negative for chest pain.  Gastrointestinal:  Negative for abdominal pain, nausea and vomiting.  Genitourinary:  Negative  for dysuria and hematuria.  Musculoskeletal:  Positive for arthralgias, back pain, myalgias and neck pain.  Skin:  Negative for rash.  Neurological:  Positive for headaches. Negative for weakness.   all other systems are negative except as noted in the HPI and PMH.    Physical Exam Updated Vital Signs BP 134/81 (BP Location: Left Arm)   Pulse 91   Resp 18   LMP  (LMP Unknown)   SpO2 98%  Physical Exam Vitals and nursing note reviewed.  Constitutional:      General: She is not in acute  distress.    Appearance: She is well-developed.     Comments: Oriented to person and place  HENT:     Head: Normocephalic and atraumatic.     Mouth/Throat:     Pharynx: No oropharyngeal exudate.  Eyes:     Conjunctiva/sclera: Conjunctivae normal.     Pupils: Pupils are equal, round, and reactive to light.     Comments: Abrasion left upper eyebrow.  Abrasion right cheek with swelling.  No trismus or malocclusion.  Extraocular movements are intact.  Neck:     Comments: Paraspinal C-spine tenderness Cardiovascular:     Rate and Rhythm: Normal rate and regular rhythm.     Heart sounds: Normal heart sounds. No murmur heard. Pulmonary:     Effort: Pulmonary effort is normal. No respiratory distress.     Breath sounds: Normal breath sounds.  Abdominal:     Palpations: Abdomen is soft.     Tenderness: There is no abdominal tenderness. There is no guarding or rebound.  Musculoskeletal:        General: Tenderness present. Normal range of motion.     Cervical back: Normal range of motion and neck supple.     Comments: Midline lumbar spine tenderness, no step-offs.  Full range of motion bilateral hips and knees.  Swelling L elbow without erythema. ROM intact.   5/5 strength in bilateral lower extremities. Ankle plantar and dorsiflexion intact. Great toe extension intact bilaterally. +2 DP and PT pulses. +2 patellar reflexes bilaterally.   Skin:    General: Skin is warm.  Neurological:     Mental Status: She is alert and oriented to person, place, and time.     Cranial Nerves: No cranial nerve deficit.     Motor: No abnormal muscle tone.     Coordination: Coordination normal.     Comments:  5/5 strength throughout. CN 2-12 intact.Equal grip strength.   Psychiatric:        Behavior: Behavior normal.     ED Results / Procedures / Treatments   Labs (all labs ordered are listed, but only abnormal results are displayed) Labs Reviewed  RAPID URINE DRUG SCREEN, HOSP PERFORMED  URINALYSIS,  ROUTINE W REFLEX MICROSCOPIC  CBC WITH DIFFERENTIAL/PLATELET  COMPREHENSIVE METABOLIC PANEL WITH GFR  ETHANOL  TROPONIN I (HIGH SENSITIVITY)    EKG None  Radiology No results found.  Procedures Procedures  {Document cardiac monitor, telemetry assessment procedure when appropriate:1}  Medications Ordered in ED Medications  Tdap (BOOSTRIX) injection 0.5 mL (has no administration in time range)    ED Course/ Medical Decision Making/ A&P   {   Click here for ABCD2, HEART and other calculatorsREFRESH Note before signing :1}                              Medical Decision Making Amount and/or Complexity of Data Reviewed Labs: ordered. Decision-making  details documented in ED Course. Radiology: ordered and independent interpretation performed. Decision-making details documented in ED Course. ECG/medicine tests: ordered and independent interpretation performed. Decision-making details documented in ED Course.  Risk Prescription drug management.   Patient here after apparent fall due to intoxication.  Vitals are stable.  No distress.  She is oriented to person and place.  Complains of chronic neck and back pain.  No new focal weakness, numbness or tingling.  She does have multiple abrasions and swelling to her face.  Will obtain CT imaging.  {Document critical care time when appropriate:1} {Document review of labs and clinical decision tools ie heart score, Chads2Vasc2 etc:1}  {Document your independent review of radiology images, and any outside records:1} {Document your discussion with family members, caretakers, and with consultants:1} {Document social determinants of health affecting pt's care:1} {Document your decision making why or why not admission, treatments were needed:1} Final Clinical Impression(s) / ED Diagnoses Final diagnoses:  None    Rx / DC Orders ED Discharge Orders     None

## 2023-12-19 ENCOUNTER — Emergency Department (HOSPITAL_COMMUNITY)

## 2023-12-19 DIAGNOSIS — Z043 Encounter for examination and observation following other accident: Secondary | ICD-10-CM | POA: Diagnosis not present

## 2023-12-19 DIAGNOSIS — M47816 Spondylosis without myelopathy or radiculopathy, lumbar region: Secondary | ICD-10-CM | POA: Diagnosis not present

## 2023-12-19 DIAGNOSIS — I700 Atherosclerosis of aorta: Secondary | ICD-10-CM | POA: Diagnosis not present

## 2023-12-19 DIAGNOSIS — K5730 Diverticulosis of large intestine without perforation or abscess without bleeding: Secondary | ICD-10-CM | POA: Diagnosis not present

## 2023-12-19 DIAGNOSIS — S3991XA Unspecified injury of abdomen, initial encounter: Secondary | ICD-10-CM | POA: Diagnosis not present

## 2023-12-19 DIAGNOSIS — M47814 Spondylosis without myelopathy or radiculopathy, thoracic region: Secondary | ICD-10-CM | POA: Diagnosis not present

## 2023-12-19 DIAGNOSIS — S0083XA Contusion of other part of head, initial encounter: Secondary | ICD-10-CM | POA: Diagnosis not present

## 2023-12-19 DIAGNOSIS — S299XXA Unspecified injury of thorax, initial encounter: Secondary | ICD-10-CM | POA: Diagnosis not present

## 2023-12-19 LAB — COMPREHENSIVE METABOLIC PANEL WITH GFR
ALT: 38 U/L (ref 0–44)
AST: 69 U/L — ABNORMAL HIGH (ref 15–41)
Albumin: 4.4 g/dL (ref 3.5–5.0)
Alkaline Phosphatase: 89 U/L (ref 38–126)
Anion gap: 16 — ABNORMAL HIGH (ref 5–15)
BUN: <5 mg/dL — ABNORMAL LOW (ref 6–20)
CO2: 26 mmol/L (ref 22–32)
Calcium: 9.1 mg/dL (ref 8.9–10.3)
Chloride: 93 mmol/L — ABNORMAL LOW (ref 98–111)
Creatinine, Ser: 0.53 mg/dL (ref 0.44–1.00)
GFR, Estimated: >60 mL/min (ref >60)
Glucose, Bld: 121 mg/dL — ABNORMAL HIGH (ref 70–99)
Potassium: 2.8 mmol/L — ABNORMAL LOW (ref 3.5–5.1)
Sodium: 135 mmol/L (ref 135–145)
Total Bilirubin: 0.6 mg/dL (ref 0.0–1.2)
Total Protein: 9 g/dL — ABNORMAL HIGH (ref 6.5–8.1)

## 2023-12-19 LAB — TROPONIN I (HIGH SENSITIVITY)
Troponin I (High Sensitivity): 9 ng/L (ref <18)
Troponin I (High Sensitivity): 10 ng/L (ref <18)

## 2023-12-19 LAB — ETHANOL: Alcohol, Ethyl (B): 352 mg/dL (ref <10)

## 2023-12-19 MED ORDER — ACETAMINOPHEN 325 MG PO TABS
650.0000 mg | ORAL_TABLET | Freq: Once | ORAL | Status: AC
  Administered 2023-12-19: 650 mg via ORAL
  Filled 2023-12-19: qty 2

## 2023-12-19 MED ORDER — FENTANYL CITRATE PF 50 MCG/ML IJ SOSY
50.0000 ug | PREFILLED_SYRINGE | Freq: Once | INTRAMUSCULAR | Status: AC
  Administered 2023-12-19: 50 ug via INTRAVENOUS
  Filled 2023-12-19: qty 1

## 2023-12-19 MED ORDER — KETOROLAC TROMETHAMINE 15 MG/ML IJ SOLN
15.0000 mg | Freq: Once | INTRAMUSCULAR | Status: AC
  Administered 2023-12-19: 15 mg via INTRAVENOUS
  Filled 2023-12-19: qty 1

## 2023-12-19 MED ORDER — POTASSIUM CHLORIDE CRYS ER 20 MEQ PO TBCR
40.0000 meq | EXTENDED_RELEASE_TABLET | Freq: Once | ORAL | Status: DC
  Filled 2023-12-19: qty 2

## 2023-12-19 MED ORDER — POTASSIUM CHLORIDE CRYS ER 20 MEQ PO TBCR: 20.0000 meq | EXTENDED_RELEASE_TABLET | Freq: Two times a day (BID) | ORAL | 0 refills | Status: DC

## 2023-12-19 MED ORDER — IOHEXOL 350 MG/ML SOLN
75.0000 mL | Freq: Once | INTRAVENOUS | Status: AC | PRN
  Administered 2023-12-19: 75 mL via INTRAVENOUS

## 2023-12-19 NOTE — Discharge Instructions (Signed)
 Your testing is negative for serious traumatic injury.  No fractures.  Reduce your alcohol intake.  Your potassium is low and should take the supplementation and follow-up with your doctor for recheck of your potassium next week.  Return to the ED with new or worsening symptoms

## 2023-12-19 NOTE — ED Notes (Signed)
 This nurse went to patient to discharge her, this nurse attempted to call patient due to patient having an IV placed, call went to voicemail. Unknown if patient had IV removed.

## 2024-01-13 ENCOUNTER — Other Ambulatory Visit: Payer: Self-pay | Admitting: Family

## 2024-01-13 DIAGNOSIS — M130 Polyarthritis, unspecified: Secondary | ICD-10-CM

## 2024-01-13 DIAGNOSIS — M1990 Unspecified osteoarthritis, unspecified site: Secondary | ICD-10-CM | POA: Diagnosis not present

## 2024-01-27 DIAGNOSIS — Z1212 Encounter for screening for malignant neoplasm of rectum: Secondary | ICD-10-CM | POA: Diagnosis not present

## 2024-01-27 DIAGNOSIS — Z1211 Encounter for screening for malignant neoplasm of colon: Secondary | ICD-10-CM | POA: Diagnosis not present

## 2024-02-02 LAB — COLOGUARD: COLOGUARD: POSITIVE — AB

## 2024-02-03 ENCOUNTER — Other Ambulatory Visit: Payer: Self-pay | Admitting: Family

## 2024-02-03 ENCOUNTER — Ambulatory Visit: Payer: Self-pay | Admitting: Family

## 2024-02-03 ENCOUNTER — Encounter: Payer: Self-pay | Admitting: Family

## 2024-02-03 DIAGNOSIS — M7989 Other specified soft tissue disorders: Secondary | ICD-10-CM | POA: Diagnosis not present

## 2024-02-03 DIAGNOSIS — M47819 Spondylosis without myelopathy or radiculopathy, site unspecified: Secondary | ICD-10-CM | POA: Diagnosis not present

## 2024-02-03 DIAGNOSIS — M1990 Unspecified osteoarthritis, unspecified site: Secondary | ICD-10-CM | POA: Diagnosis not present

## 2024-02-03 DIAGNOSIS — R195 Other fecal abnormalities: Secondary | ICD-10-CM

## 2024-02-03 DIAGNOSIS — M79671 Pain in right foot: Secondary | ICD-10-CM | POA: Diagnosis not present

## 2024-02-07 ENCOUNTER — Other Ambulatory Visit: Payer: Self-pay | Admitting: Family

## 2024-02-07 ENCOUNTER — Ambulatory Visit: Payer: Self-pay

## 2024-02-07 DIAGNOSIS — F419 Anxiety disorder, unspecified: Secondary | ICD-10-CM

## 2024-02-07 NOTE — Telephone Encounter (Signed)
   No triage: Pt calling to ask if medication can be "rushed." RN explained the 72 rule. RN suggested Pt ask pharmacy for a couple days until refill can be processed.           Copied from CRM [id number]. Topic: Clinical - Medication Refill >> Feb 07, 2024  1:49 PM Corin V wrote: Medication: DULoxetine  (CYMBALTA ) 30 MG capsule  Has the patient contacted their pharmacy? Yes (Agent: If no, request that the patient contact the pharmacy for the refill. If patient does not wish to contact the pharmacy document the reason why and proceed with request.) (Agent: If yes, when and what did the pharmacy advise?)  This is the patient's preferred pharmacy:  [facility] - [place], [place] - [address] [place] [address] Phone: [phone] Fax: [phone]  Is this the correct pharmacy for this prescription? Yes If no, delete pharmacy and type the correct one.   Has the prescription been filled recently? No  Is the patient out of the medication? Yes  Has the patient been seen for an appointment in the last year OR does the patient have an upcoming appointment? Yes  Can we respond through MyChart? Yes  Agent: Please be advised that Rx refills may take up to 3 business days. We ask that you follow-up with your pharmacy. Reason for Disposition  Health Information question, no triage required and triager able to answer question  Answer Assessment - Initial Assessment Questions 1. REASON FOR CALL or QUESTION: "What is your reason for calling today?" or "How can I best help you?" or "What question do you have that I can help answer?"     Wants medication refilled today  Protocols used: Information Only Call - No Triage-A-AH

## 2024-02-07 NOTE — Telephone Encounter (Signed)
 Copied from CRM [id number]. Topic: Clinical - Medication Refill >> Feb 07, 2024  1:49 PM Corin V wrote: Medication: DULoxetine  (CYMBALTA ) 30 MG capsule  Has the patient contacted their pharmacy? Yes (Agent: If no, request that the patient contact the pharmacy for the refill. If patient does not wish to contact the pharmacy document the reason why and proceed with request.) (Agent: If yes, when and what did the pharmacy advise?)  This is the patient's preferred pharmacy:  [facility] - [place], [place] - [address] [place] [address] Phone: [phone] Fax: [phone]  Is this the correct pharmacy for this prescription? Yes If no, delete pharmacy and type the correct one.   Has the prescription been filled recently? No  Is the patient out of the medication? Yes  Has the patient been seen for an appointment in the last year OR does the patient have an upcoming appointment? Yes  Can we respond through MyChart? Yes  Agent: Please be advised that Rx refills may take up to 3 business days. We ask that you follow-up with your pharmacy.

## 2024-02-08 NOTE — Telephone Encounter (Signed)
 Bascom Bossier ordered this medication on 02/07/24.

## 2024-02-10 ENCOUNTER — Other Ambulatory Visit (INDEPENDENT_AMBULATORY_CARE_PROVIDER_SITE_OTHER)

## 2024-02-10 ENCOUNTER — Other Ambulatory Visit: Payer: Self-pay

## 2024-02-10 DIAGNOSIS — E119 Type 2 diabetes mellitus without complications: Secondary | ICD-10-CM | POA: Diagnosis not present

## 2024-02-10 DIAGNOSIS — R899 Unspecified abnormal finding in specimens from other organs, systems and tissues: Secondary | ICD-10-CM | POA: Diagnosis not present

## 2024-02-11 LAB — COMPREHENSIVE METABOLIC PANEL WITH GFR
ALT: 73 U/L — ABNORMAL HIGH (ref 0–35)
AST: 111 U/L — ABNORMAL HIGH (ref 0–37)
Albumin: 4.9 g/dL (ref 3.5–5.2)
Alkaline Phosphatase: 117 U/L (ref 39–117)
BUN: 11 mg/dL (ref 6–23)
CO2: 31 meq/L (ref 19–32)
Calcium: 9.8 mg/dL (ref 8.4–10.5)
Chloride: 91 meq/L — ABNORMAL LOW (ref 96–112)
Creatinine, Ser: 0.62 mg/dL (ref 0.40–1.20)
GFR: 102.86 mL/min (ref >60.00)
Glucose, Bld: 110 mg/dL — ABNORMAL HIGH (ref 70–99)
Potassium: 3.7 meq/L (ref 3.5–5.1)
Sodium: 137 meq/L (ref 135–145)
Total Bilirubin: 0.9 mg/dL (ref 0.2–1.2)
Total Protein: 8.1 g/dL (ref 6.0–8.3)

## 2024-02-11 LAB — HEMOGLOBIN A1C: Hgb A1c MFr Bld: 5.8 % (ref 4.6–6.5)

## 2024-02-14 ENCOUNTER — Ambulatory Visit

## 2024-02-15 ENCOUNTER — Ambulatory Visit: Payer: Self-pay | Admitting: Family

## 2024-02-15 ENCOUNTER — Ambulatory Visit (HOSPITAL_BASED_OUTPATIENT_CLINIC_OR_DEPARTMENT_OTHER): Admitting: Student

## 2024-02-15 DIAGNOSIS — S52572A Other intraarticular fracture of lower end of left radius, initial encounter for closed fracture: Secondary | ICD-10-CM | POA: Diagnosis not present

## 2024-02-15 NOTE — Progress Notes (Deleted)
   Assessment & Plan:  There are no diagnoses linked to this encounter.   Return precautions given.   Risks, benefits, and alternatives of the medications and treatment plan prescribed today were discussed, and patient expressed understanding.   Education regarding symptom management and diagnosis given to patient on AVS either electronically or printed.  No follow-ups on file.  Bascom Bossier, FNP  Subjective:    Patient ID: Judith Drake, female    DOB: 05/18/1971, 53 y.o.   MRN: [id number]  CC: Judith Drake is a 53 y.o. female who presents today for follow up.   HPI: A1c improved Positive cologuard  Elevated liver enzymes    Allergies: Amoxicillin, Dilaudid  [hydromorphone  hcl], and Morphine  and codeine Current Outpatient Medications on File Prior to Visit  Medication Sig Dispense Refill   amLODipine  (NORVASC ) 10 MG tablet Take 1 tablet (10 mg total) by mouth daily. 90 tablet 3   ascorbic acid (VITAMIN C) 500 MG tablet Take 500-1,000 mg by mouth daily.     Aspirin -Salicylamide-Caffeine (BC HEADACHE POWDER PO) Take 1 packet by mouth 2 (two) times daily as needed (for pain, headaches, or discomfort).     cetirizine  (ZYRTEC ) 10 MG tablet Take 1 tablet (10 mg total) by mouth daily. (Patient taking differently: Take 10 mg by mouth daily as needed for allergies or rhinitis.) 30 tablet 0   DULoxetine  (CYMBALTA ) 30 MG capsule TAKE TWO CAPSULES (60 MG TOTAL) BY MOUTH DAILY. 90 capsule 1   ferrous sulfate  325 (65 FE) MG EC tablet Take 1 tablet (325 mg total) by mouth daily with breakfast. 90 tablet 0   meloxicam  (MOBIC ) 7.5 MG tablet TAKE ONE (1) TO TWO (2) TABLETS (7.5-15 MG TOTAL) BY MOUTH DAILY AS NEEDED FOR PAIN. 60 tablet 2   omeprazole  (PRILOSEC) 20 MG capsule Take 1 capsule (20 mg total) by mouth daily. (Patient taking differently: Take 20 mg by mouth daily before breakfast.) 90 capsule 1   ondansetron  (ZOFRAN ) 4 MG tablet Take 1 tablet (4 mg total) by mouth every 8  (eight) hours as needed for nausea or vomiting. 12 tablet 0   potassium chloride  SA (KLOR-CON  M) 20 MEQ tablet Take 1 tablet (20 mEq total) by mouth 2 (two) times daily. 6 tablet 0   No current facility-administered medications on file prior to visit.    Review of Systems    Objective:    LMP  (LMP Unknown)  BP Readings from Last 3 Encounters:  12/18/23 134/81  11/18/23 128/86  11/10/23 134/84   Wt Readings from Last 3 Encounters:  11/18/23 158 lb 12.8 oz (72 kg)  11/05/23 163 lb 5.8 oz (74.1 kg)  11/04/23 162 lb (73.5 kg)    Physical Exam

## 2024-02-16 DIAGNOSIS — X58XXXA Exposure to other specified factors, initial encounter: Secondary | ICD-10-CM | POA: Diagnosis not present

## 2024-02-16 DIAGNOSIS — Y999 Unspecified external cause status: Secondary | ICD-10-CM | POA: Diagnosis not present

## 2024-02-16 DIAGNOSIS — S52522A Torus fracture of lower end of left radius, initial encounter for closed fracture: Secondary | ICD-10-CM | POA: Diagnosis not present

## 2024-02-16 DIAGNOSIS — S52572A Other intraarticular fracture of lower end of left radius, initial encounter for closed fracture: Secondary | ICD-10-CM | POA: Diagnosis not present

## 2024-02-16 DIAGNOSIS — G8918 Other acute postprocedural pain: Secondary | ICD-10-CM | POA: Diagnosis not present

## 2024-02-17 ENCOUNTER — Ambulatory Visit: Admitting: Family

## 2024-02-18 ENCOUNTER — Other Ambulatory Visit: Payer: Self-pay | Admitting: Nurse Practitioner

## 2024-02-18 DIAGNOSIS — K219 Gastro-esophageal reflux disease without esophagitis: Secondary | ICD-10-CM

## 2024-02-23 NOTE — Telephone Encounter (Signed)
 Copied from CRM [id number]. Topic: General - Other >> Feb 23, 2024 12:02 PM Emmet Harm C wrote: Reason for CRM: Patient was returning Janetta call she said she had a message from her not sure for  what

## 2024-02-23 NOTE — Telephone Encounter (Signed)
 Copied from CRM [id number]. Topic: General - Other >> Feb 23, 2024  3:47 PM Aisha D wrote: Reason for CRM: Pt is returning a missed call from Janetta and was informed that she is currently with a pt and will call back once available.

## 2024-02-24 ENCOUNTER — Other Ambulatory Visit: Payer: Self-pay

## 2024-02-24 ENCOUNTER — Telehealth: Payer: Self-pay

## 2024-02-24 ENCOUNTER — Other Ambulatory Visit (HOSPITAL_COMMUNITY): Payer: Self-pay

## 2024-02-24 DIAGNOSIS — Z1231 Encounter for screening mammogram for malignant neoplasm of breast: Secondary | ICD-10-CM

## 2024-02-24 NOTE — Telephone Encounter (Signed)
 Ordered mammogram to [facility].

## 2024-02-24 NOTE — Telephone Encounter (Unsigned)
 Copied from CRM [id number]. Topic: General - Other >> Feb 24, 2024 10:47 AM Martinique E wrote: Reason for CRM: Patient returning call from Russell, relayed message to patient and stated she does not have a preference of where this mammogram order should be sent to, just a provider within her area.

## 2024-02-24 NOTE — Telephone Encounter (Signed)
 Left message to call the office back to see where she would like to have her Mammogram so we can order it.

## 2024-02-24 NOTE — Telephone Encounter (Signed)
 Pharmacy Patient Advocate Encounter   Received notification from Onbase that prior authorization for Omeprazole  20 is required/requested.   Insurance verification completed.   The patient is insured through [employer] .   Per test claim: patient has quantity limit of 90 per 365 days. Next fill date in 01/20/25

## 2024-02-29 DIAGNOSIS — S52572D Other intraarticular fracture of lower end of left radius, subsequent encounter for closed fracture with routine healing: Secondary | ICD-10-CM | POA: Diagnosis not present

## 2024-03-01 ENCOUNTER — Encounter: Payer: Self-pay | Admitting: Family

## 2024-03-01 ENCOUNTER — Ambulatory Visit (INDEPENDENT_AMBULATORY_CARE_PROVIDER_SITE_OTHER): Admitting: Family

## 2024-03-01 VITALS — BP 128/82 | HR 112 | Temp 98.2°F | Resp 112 | Ht 64.0 in | Wt 166.0 lb

## 2024-03-01 DIAGNOSIS — I10 Essential (primary) hypertension: Secondary | ICD-10-CM | POA: Diagnosis not present

## 2024-03-01 DIAGNOSIS — F1010 Alcohol abuse, uncomplicated: Secondary | ICD-10-CM

## 2024-03-01 DIAGNOSIS — Z23 Encounter for immunization: Secondary | ICD-10-CM | POA: Diagnosis not present

## 2024-03-01 DIAGNOSIS — A539 Syphilis, unspecified: Secondary | ICD-10-CM | POA: Diagnosis not present

## 2024-03-01 DIAGNOSIS — E119 Type 2 diabetes mellitus without complications: Secondary | ICD-10-CM | POA: Diagnosis not present

## 2024-03-01 DIAGNOSIS — M2550 Pain in unspecified joint: Secondary | ICD-10-CM | POA: Diagnosis not present

## 2024-03-01 DIAGNOSIS — F419 Anxiety disorder, unspecified: Secondary | ICD-10-CM

## 2024-03-01 DIAGNOSIS — F32A Depression, unspecified: Secondary | ICD-10-CM

## 2024-03-01 DIAGNOSIS — K760 Fatty (change of) liver, not elsewhere classified: Secondary | ICD-10-CM

## 2024-03-01 MED ORDER — DULOXETINE HCL 30 MG PO CPEP: 90.0000 mg | ORAL_CAPSULE | Freq: Every day | ORAL | Status: DC

## 2024-03-01 NOTE — Patient Instructions (Addendum)
 Trial increase cymbalta  to 90mg   If not effective, please decrease back to 60mg   Please call  and schedule your 3D mammogram and /or bone density scan as we discussed.   [facility]  ( new location in 2023)  [address], [place], [place] [address]  [place], [place]  [phone]   I would advise the Twinrix vaccine to prevent hepatitis A and hepatitis B.  Primary vaccination with Twinrix involves three doses administered over a 62-month schedule.  The first dose  1/3 is followed by vaccine 2/3 one month after,  followed by 3/3 vaccine 6 months from initial.   You were given first dose of Twinrix today

## 2024-03-01 NOTE — Assessment & Plan Note (Signed)
Repeat titers today

## 2024-03-01 NOTE — Assessment & Plan Note (Signed)
 Chronic, improved. Trial increase cymbalta  to 90mg  ; discussed doses > 60mg  may not confer therapeutic benefit however we agreed reasonable to trial since she has felt better on medication.  Close follow up.

## 2024-03-01 NOTE — Assessment & Plan Note (Signed)
 Counseled on alcohol use and liver disease, elevated LFTs. Will continue to counsel, monitor.

## 2024-03-01 NOTE — Assessment & Plan Note (Signed)
 Discussed elevated LFTs, alcohol is and risk of progression TwinRix  1/3 given today.  She is scheduled to see GI. Will follow.

## 2024-03-01 NOTE — Progress Notes (Signed)
 Assessment & Plan:  Anxiety and depression Assessment & Plan: Chronic, improved. Trial increase cymbalta  to 90mg  ; discussed doses > 60mg  may not confer therapeutic benefit however we agreed reasonable to trial since she has felt better on medication.  Close follow up.   Orders: -     DULoxetine  HCl; Take 3 capsules (90 mg total) by mouth daily.  Essential hypertension Assessment & Plan: Excellent control of blood pressure. Continue amlodipine  10mg  qd   Alcohol abuse Assessment & Plan: Counseled on alcohol use and liver disease, elevated LFTs. Will continue to counsel, monitor.    Type 2 diabetes mellitus without complication, without long-term current use of insulin (HCC)  Syphilis, unspecified Assessment & Plan: Repeat titers today.   Orders: -     RPR (MONITOR) W/REFL  Need for hepatitis A and B vaccination -     Hepatitis A hepatitis B combined vaccine IM  Polyarthralgia Assessment & Plan: She opted not to start plaquenil and to treat prn with prednisone  10mg  as provided by rheumatology Will follow.    Hepatic steatosis Assessment & Plan: Discussed elevated LFTs, alcohol is and risk of progression TwinRix  1/3 given today.  She is scheduled to see GI. Will follow.       Return precautions given.   Risks, benefits, and alternatives of the medications and treatment plan prescribed today were discussed, and patient expressed understanding.   Education regarding symptom management and diagnosis given to patient on AVS either electronically or printed.  Return in about 6 weeks (around 04/12/2024).  Rollene Northern, FNP  Subjective:    Patient ID: Judith Drake, female    DOB: Jan 12, 1971, 53 y.o.   MRN: [id number]  CC: Judith Drake is a 53 y.o. female who presents today for follow up.   HPI: She has felt more calm with Cymbalta  60mg  every day  She recent left arm fracture, following with [facility]  She has family stressors including loss  of dog, husband has had recent heart disease, hospitalization.   She is drinking alcohol , vodka 2 drinks nightly.      Due for TwinRix 0, 1, 6 months  No longer on ferrous sulfate   Cologuard positive  Denies rectal bleeding  GI consult 03/04/23  Liver enzymes elevated  02/03/24 Dr Tobie , rheumatology Started plaquenil; patient declined.  Reducing prednisone . She keeps prednisone  10mg  tablets to keep on hand.      Allergies: Amoxicillin, Dilaudid  [hydromorphone  hcl], and Morphine  and codeine Current Outpatient Medications on File Prior to Visit  Medication Sig Dispense Refill   amLODipine  (NORVASC ) 10 MG tablet Take 1 tablet (10 mg total) by mouth daily. 90 tablet 3   Aspirin -Salicylamide-Caffeine (BC HEADACHE POWDER PO) Take 1 packet by mouth 2 (two) times daily as needed (for pain, headaches, or discomfort).     meloxicam  (MOBIC ) 7.5 MG tablet TAKE ONE (1) TO TWO (2) TABLETS (7.5-15 MG TOTAL) BY MOUTH DAILY AS NEEDED FOR PAIN. 60 tablet 2   omeprazole  (PRILOSEC) 20 MG capsule TAKE ONE CAPSULE (20 MG TOTAL) BY MOUTH DAILY. 90 capsule 0   ondansetron  (ZOFRAN ) 4 MG tablet Take 1 tablet (4 mg total) by mouth every 8 (eight) hours as needed for nausea or vomiting. 12 tablet 0   cetirizine  (ZYRTEC ) 10 MG tablet Take 1 tablet (10 mg total) by mouth daily. (Patient taking differently: Take 10 mg by mouth daily as needed for allergies or rhinitis.) 30 tablet 0   No current facility-administered medications on file prior  to visit.    Review of Systems  Constitutional:  Negative for chills and fever.  Respiratory:  Negative for cough.   Cardiovascular:  Negative for chest pain and palpitations.  Gastrointestinal:  Negative for nausea and vomiting.  Neurological:  Negative for tremors.      Objective:    BP 128/82 (Cuff Size: Normal)   Pulse (!) 112   Temp 98.2 F (36.8 C) (Oral)   Resp (!) 112   Ht 5' 4 (1.626 m)   Wt 166 lb (75.3 kg)   LMP  (LMP Unknown)   SpO2 95%    BMI 28.49 kg/m  BP Readings from Last 3 Encounters:  03/01/24 128/82  12/18/23 134/81  11/18/23 128/86   Wt Readings from Last 3 Encounters:  03/01/24 166 lb (75.3 kg)  11/18/23 158 lb 12.8 oz (72 kg)  11/05/23 163 lb 5.8 oz (74.1 kg)      03/01/2024    1:23 PM 11/18/2023   11:37 AM 09/23/2023    4:33 PM  Depression screen PHQ 2/9  Decreased Interest 1 0 0  Down, Depressed, Hopeless 1 0 0  PHQ - 2 Score 2 0 0  Altered sleeping 0    Tired, decreased energy 1    Change in appetite 0    Feeling bad or failure about yourself  1    Trouble concentrating 0    Moving slowly or fidgety/restless 0    Suicidal thoughts 0    PHQ-9 Score 4    Difficult doing work/chores Not difficult at all        03/01/2024    1:23 PM 01/14/2022    3:20 PM  GAD 7 : Generalized Anxiety Score  Nervous, Anxious, on Edge 1 0  Control/stop worrying 1 0  Worry too much - different things 1 0  Trouble relaxing 1 0  Restless 0 0  Easily annoyed or irritable 0 0  Afraid - awful might happen 1 0  Total GAD 7 Score 5 0  Anxiety Difficulty Not difficult at all Not difficult at all      Physical Exam Vitals reviewed.  Constitutional:      Appearance: She is well-developed.   Eyes:     Conjunctiva/sclera: Conjunctivae normal.    Cardiovascular:     Rate and Rhythm: Normal rate and regular rhythm.     Pulses: Normal pulses.     Heart sounds: Normal heart sounds.  Pulmonary:     Effort: Pulmonary effort is normal.     Breath sounds: Normal breath sounds. No wheezing, rhonchi or rales.   Skin:    General: Skin is warm and dry.   Neurological:     Mental Status: She is alert.     Motor: No tremor.   Psychiatric:        Speech: Speech normal.        Behavior: Behavior normal.        Thought Content: Thought content normal.

## 2024-03-01 NOTE — Assessment & Plan Note (Signed)
 Excellent control of blood pressure. Continue amlodipine  10mg  qd

## 2024-03-01 NOTE — Assessment & Plan Note (Signed)
 She opted not to start plaquenil and to treat prn with prednisone  10mg  as provided by rheumatology Will follow.

## 2024-03-03 ENCOUNTER — Other Ambulatory Visit: Payer: Self-pay | Admitting: Family

## 2024-03-03 DIAGNOSIS — F1010 Alcohol abuse, uncomplicated: Secondary | ICD-10-CM | POA: Diagnosis not present

## 2024-03-03 DIAGNOSIS — R195 Other fecal abnormalities: Secondary | ICD-10-CM | POA: Diagnosis not present

## 2024-03-03 DIAGNOSIS — F32A Anxiety disorder, unspecified: Secondary | ICD-10-CM

## 2024-03-03 LAB — RPR (MONITOR) W/REFL: RPR (Monitor) w/refl Titer: REACTIVE — AB

## 2024-03-03 LAB — RPR TITER: RPR Titer: 1:8 {titer} — ABNORMAL HIGH

## 2024-03-03 NOTE — Telephone Encounter (Unsigned)
 Copied from CRM [id number]. Topic: Clinical - Medication Refill >> Mar 03, 2024  2:23 PM Hamdi H wrote: Medication: DULoxetine  (CYMBALTA ) 30 MG capsule  Has the patient contacted their pharmacy? Yes (Agent: If no, request that the patient contact the pharmacy for the refill. If patient does not wish to contact the pharmacy document the reason why and proceed with request.) (Agent: If yes, when and what did the pharmacy advise?) Pharmacy hasn't received it yet.   This is the patient's preferred pharmacy:  [facility] - [place], [place] - [address] [place] [address] Phone: [phone] Fax: [phone]  Is this the correct pharmacy for this prescription? Yes If no, delete pharmacy and type the correct one.   Has the prescription been filled recently? Yes  Is the patient out of the medication? No  Has the patient been seen for an appointment in the last year OR does the patient have an upcoming appointment? Yes  Can we respond through MyChart? Yes  Agent: Please be advised that Rx refills may take up to 3 business days. We ask that you follow-up with your pharmacy.

## 2024-03-06 ENCOUNTER — Ambulatory Visit: Payer: Self-pay | Admitting: Family

## 2024-03-06 MED ORDER — DULOXETINE HCL 30 MG PO CPEP: 90.0000 mg | ORAL_CAPSULE | Freq: Every day | ORAL | Status: DC

## 2024-03-08 ENCOUNTER — Other Ambulatory Visit (HOSPITAL_COMMUNITY): Payer: Self-pay

## 2024-03-08 ENCOUNTER — Telehealth: Payer: Self-pay

## 2024-03-08 NOTE — Telephone Encounter (Signed)
 Pharmacy Patient Advocate Encounter   Received notification from CoverMyMeds that prior authorization for Omeprazole  20MG  dr capsules is required/requested.   Insurance verification completed.   The patient is insured through [employer] .   Per test claim: PA required and submitted KEY/EOC/Request #: [id number] from 03/08/24 to 03/08/25. Ran test claim, Copay is $0. This test claim was processed through [facility]- copay amounts may vary at other pharmacies due to pharmacy/plan contracts, or as the patient moves through the different stages of their insurance plan.

## 2024-03-09 NOTE — Telephone Encounter (Signed)
 Lvm to notify pt of approval. Mychart message sent

## 2024-03-14 DIAGNOSIS — M25532 Pain in left wrist: Secondary | ICD-10-CM | POA: Diagnosis not present

## 2024-03-20 ENCOUNTER — Encounter
Admission: RE | Disposition: A | Discharge status: Home / Self Care | Payer: Self-pay | Source: Home / Self Care | Attending: Gastroenterology

## 2024-03-20 ENCOUNTER — Ambulatory Visit: Admitting: Certified Registered"

## 2024-03-20 ENCOUNTER — Other Ambulatory Visit: Payer: Self-pay

## 2024-03-20 ENCOUNTER — Ambulatory Visit
Admission: RE | Admit: 2024-03-20 | Discharge: 2024-03-20 | Disposition: A | Discharge status: Home / Self Care | Attending: Gastroenterology | Admitting: Gastroenterology

## 2024-03-20 DIAGNOSIS — F17210 Nicotine dependence, cigarettes, uncomplicated: Secondary | ICD-10-CM | POA: Insufficient documentation

## 2024-03-20 DIAGNOSIS — E119 Type 2 diabetes mellitus without complications: Secondary | ICD-10-CM | POA: Diagnosis not present

## 2024-03-20 DIAGNOSIS — K219 Gastro-esophageal reflux disease without esophagitis: Secondary | ICD-10-CM | POA: Diagnosis not present

## 2024-03-20 DIAGNOSIS — Z9851 Tubal ligation status: Secondary | ICD-10-CM | POA: Diagnosis not present

## 2024-03-20 DIAGNOSIS — Z1211 Encounter for screening for malignant neoplasm of colon: Secondary | ICD-10-CM | POA: Insufficient documentation

## 2024-03-20 DIAGNOSIS — K5730 Diverticulosis of large intestine without perforation or abscess without bleeding: Secondary | ICD-10-CM | POA: Diagnosis not present

## 2024-03-20 DIAGNOSIS — K56699 Other intestinal obstruction unspecified as to partial versus complete obstruction: Secondary | ICD-10-CM | POA: Insufficient documentation

## 2024-03-20 DIAGNOSIS — F419 Anxiety disorder, unspecified: Secondary | ICD-10-CM | POA: Insufficient documentation

## 2024-03-20 DIAGNOSIS — K640 First degree hemorrhoids: Secondary | ICD-10-CM | POA: Insufficient documentation

## 2024-03-20 DIAGNOSIS — Z800 Family history of malignant neoplasm of digestive organs: Secondary | ICD-10-CM | POA: Diagnosis not present

## 2024-03-20 DIAGNOSIS — D122 Benign neoplasm of ascending colon: Secondary | ICD-10-CM | POA: Insufficient documentation

## 2024-03-20 DIAGNOSIS — Z79899 Other long term (current) drug therapy: Secondary | ICD-10-CM | POA: Insufficient documentation

## 2024-03-20 DIAGNOSIS — F32A Depression, unspecified: Secondary | ICD-10-CM | POA: Diagnosis not present

## 2024-03-20 DIAGNOSIS — I10 Essential (primary) hypertension: Secondary | ICD-10-CM | POA: Diagnosis not present

## 2024-03-20 DIAGNOSIS — K635 Polyp of colon: Secondary | ICD-10-CM | POA: Diagnosis not present

## 2024-03-20 DIAGNOSIS — R195 Other fecal abnormalities: Secondary | ICD-10-CM | POA: Diagnosis not present

## 2024-03-20 HISTORY — PX: COLONOSCOPY: SHX5424

## 2024-03-20 HISTORY — PX: POLYPECTOMY: SHX149

## 2024-03-20 SURGERY — COLONOSCOPY
Anesthesia: General

## 2024-03-20 MED ORDER — PROPOFOL 500 MG/50ML IV EMUL
INTRAVENOUS | Status: DC | PRN
  Administered 2024-03-20: 130 ug/kg/min via INTRAVENOUS

## 2024-03-20 MED ORDER — PROPOFOL 10 MG/ML IV BOLUS
INTRAVENOUS | Status: DC | PRN
Start: 2024-03-20 — End: 2024-03-20
  Administered 2024-03-20: 70 mg via INTRAVENOUS
  Administered 2024-03-20: 100 mg via INTRAVENOUS
  Administered 2024-03-20: 80 mg via INTRAVENOUS

## 2024-03-20 MED ORDER — DEXMEDETOMIDINE HCL IN NACL 80 MCG/20ML IV SOLN
INTRAVENOUS | Status: DC | PRN
  Administered 2024-03-20: 12 ug via INTRAVENOUS

## 2024-03-20 MED ORDER — LIDOCAINE HCL (CARDIAC) PF 100 MG/5ML IV SOSY
PREFILLED_SYRINGE | INTRAVENOUS | Status: DC | PRN
  Administered 2024-03-20: 50 mg via INTRAVENOUS

## 2024-03-20 MED ORDER — MIDAZOLAM HCL 2 MG/2ML IJ SOLN
INTRAMUSCULAR | Status: AC
  Filled 2024-03-20: qty 2

## 2024-03-20 MED ORDER — MIDAZOLAM HCL 2 MG/2ML IJ SOLN
INTRAMUSCULAR | Status: DC | PRN
  Administered 2024-03-20: 2 mg via INTRAVENOUS

## 2024-03-20 MED ORDER — SODIUM CHLORIDE 0.9 % IV SOLN: INTRAVENOUS | Status: DC

## 2024-03-20 NOTE — Op Note (Signed)
 [facility]astroenterology Patient Name: Judith Drake Procedure Date: 03/20/2024 11:28 AM MRN: [id number] Account #: [id number] Date of Birth: [birth year] Admit Type: Outpatient Age: 53 Room: [facility] ENDO ROOM 3 Gender: Female Note Status: Finalized Instrument Name: [id number] Procedure:             Colonoscopy Indications:           Screening for colorectal malignant neoplasm,                         Incidental - Positive Cologuard test Providers:             Ole Schick MD, MD Referring MD:          No Local Md, MD (Referring MD) Medicines:             Monitored Anesthesia Care Complications:         No immediate complications. Estimated blood loss:                         Minimal. Procedure:             Pre-Anesthesia Assessment:                        - Prior to the procedure, a History and Physical was                         performed, and patient medications and allergies were                         reviewed. The patient is competent. The risks and                         benefits of the procedure and the sedation options and                         risks were discussed with the patient. All questions                         were answered and informed consent was obtained.                         Patient identification and proposed procedure were                         verified by the physician, the nurse, the                         anesthesiologist, the anesthetist and the technician                         in the endoscopy suite. Mental Status Examination:                         alert and oriented. Airway Examination: normal                         oropharyngeal airway and neck mobility. Respiratory  Examination: clear to auscultation. CV Examination:                         normal. Prophylactic Antibiotics: The patient does not                         require prophylactic antibiotics. Prior                          Anticoagulants: The patient has taken no anticoagulant                         or antiplatelet agents. ASA Grade Assessment: II - A                         patient with mild systemic disease. After reviewing                         the risks and benefits, the patient was deemed in                         satisfactory condition to undergo the procedure. The                         anesthesia plan was to use monitored anesthesia care                         (MAC). Immediately prior to administration of                         medications, the patient was re-assessed for adequacy                         to receive sedatives. The heart rate, respiratory                         rate, oxygen saturations, blood pressure, adequacy of                         pulmonary ventilation, and response to care were                         monitored throughout the procedure. The physical                         status of the patient was re-assessed after the                         procedure.                        After obtaining informed consent, the colonoscope was                         passed under direct vision. Throughout the procedure,                         the patient's blood pressure, pulse, and oxygen  saturations were monitored continuously. The                         Colonoscope was introduced through the anus and                         advanced to the the terminal ileum, with                         identification of the appendiceal orifice and IC                         valve. The colonoscopy was somewhat difficult due to                         bowel stenosis. Successful completion of the procedure                         was aided by withdrawing the scope and replacing with                         the pediatric colonoscope. The patient tolerated the                         procedure well. The quality of the bowel preparation                         was adequate  to identify polyps. The terminal ileum,                         ileocecal valve, appendiceal orifice, and rectum were                         photographed. Findings:      The perianal and digital rectal examinations were normal.      The terminal ileum appeared normal.      A 2 mm polyp was found in the ascending colon. The polyp was sessile.       The polyp was removed with a cold snare. Resection and retrieval were       complete. Estimated blood loss was minimal.      A few small-mouthed diverticula were found in the sigmoid colon.      A benign-appearing, intrinsic mild stenosis was found in the sigmoid       colon and was traversed.      Internal hemorrhoids were found during retroflexion. The hemorrhoids       were Grade I (internal hemorrhoids that do not prolapse).      The exam was otherwise without abnormality on direct and retroflexion       views. Impression:            - The examined portion of the ileum was normal.                        - One 2 mm polyp in the ascending colon, removed with                         a cold snare. Resected and retrieved.                        -  Diverticulosis in the sigmoid colon.                        - Stricture in the sigmoid colon.                        - Internal hemorrhoids.                        - The examination was otherwise normal on direct and                         retroflexion views. Recommendation:        - Discharge patient to home.                        - Resume previous diet.                        - Continue present medications.                        - Await pathology results.                        - Repeat colonoscopy in 7 years for surveillance.                        - Return to referring physician as previously                         scheduled. Procedure Code(s):     --- Professional ---                        [phone], Colonoscopy, flexible; with removal of                         tumor(s), polyp(s), or other  lesion(s) by snare                         technique Diagnosis Code(s):     --- Professional ---                        Z12.11, Encounter for screening for malignant neoplasm                         of colon                        K64.0, First degree hemorrhoids                        D12.2, Benign neoplasm of ascending colon                        K56.699, Other intestinal obstruction unspecified as                         to partial versus complete obstruction                        K57.30, Diverticulosis of large intestine  without                         perforation or abscess without bleeding CPT copyright 2022 American Medical Association. All rights reserved. The codes documented in this report are preliminary and upon coder review may  be revised to meet current compliance requirements. Ole Schick MD, MD 03/20/2024 11:59:06 AM Number of Addenda: 0 Note Initiated On: 03/20/2024 11:28 AM Scope Withdrawal Time: 0 hours 8 minutes 7 seconds  Total Procedure Duration: 0 hours 19 minutes 11 seconds  Estimated Blood Loss:  Estimated blood loss was minimal.      [facility]

## 2024-03-20 NOTE — Anesthesia Preprocedure Evaluation (Signed)
 Anesthesia Evaluation  Patient identified by MRN, date of birth, ID band Patient awake    Reviewed: Allergy & Precautions, H&P , NPO status , Patient's Chart, lab work & pertinent test results, reviewed documented beta blocker date and time   History of Anesthesia Complications Negative for: history of anesthetic complications  Airway Mallampati: I  TM Distance: >3 FB Neck ROM: full    Dental  (+) Dental Advidsory Given, Teeth Intact, Missing Bridge x1 Veneer:   Pulmonary neg shortness of breath, neg sleep apnea, neg COPD, neg recent URI, Current Smoker   Pulmonary exam normal breath sounds clear to auscultation       Cardiovascular Exercise Tolerance: Good hypertension, (-) angina (-) Past MI and (-) Cardiac Stents Normal cardiovascular exam(-) dysrhythmias (-) Valvular Problems/Murmurs Rhythm:regular Rate:Normal     Neuro/Psych neg Seizures PSYCHIATRIC DISORDERS Anxiety Depression    negative neurological ROS     GI/Hepatic ,GERD  Medicated and Controlled,,(+)     substance abuse  alcohol useFatty liver disease   Endo/Other  diabetes (borderline), Well Controlled    Renal/GU negative Renal ROS  negative genitourinary   Musculoskeletal   Abdominal   Peds  Hematology negative hematology ROS (+)   Anesthesia Other Findings Past Medical History: 03/20/2019: Abnormal uterine bleeding (AUB) 05/12/2019: Acute lower UTI 04/10/2018: Acute respiratory failure with hypoxia (HCC) No date: Anemia No date: Arthritis     Comment:  lower back and hips 03/20/2019: Dysmenorrhea 11/24/2013: Elevated liver enzymes No date: ETOH abuse 10/03/2019: Fatty liver No date: Fibroid No date: GERD (gastroesophageal reflux disease) No date: Heavy menstrual period No date: Hypertension 12/13/2017: Menorrhagia 05/12/2019: SIRS (systemic inflammatory response syndrome) (HCC)   Reproductive/Obstetrics negative OB ROS                               Anesthesia Physical Anesthesia Plan  ASA: 2  Anesthesia Plan: General   Post-op Pain Management:    Induction: Intravenous  PONV Risk Score and Plan: 2 and Propofol  infusion, TIVA and Treatment may vary due to age or medical condition  Airway Management Planned: Natural Airway and Nasal Cannula  Additional Equipment:   Intra-op Plan:   Post-operative Plan:   Informed Consent: I have reviewed the patients History and Physical, chart, labs and discussed the procedure including the risks, benefits and alternatives for the proposed anesthesia with the patient or authorized representative who has indicated his/her understanding and acceptance.     Dental Advisory Given  Plan Discussed with: Anesthesiologist, CRNA and Surgeon  Anesthesia Plan Comments:          Anesthesia Quick Evaluation

## 2024-03-20 NOTE — Transfer of Care (Signed)
 Immediate Anesthesia Transfer of Care Note  Patient: Judith Drake  Procedure(s) Performed: COLONOSCOPY POLYPECTOMY, INTESTINE  Patient Location: Endoscopy Unit  Anesthesia Type:General  Level of Consciousness: drowsy  Airway & Oxygen Therapy: Patient Spontanous Breathing  Post-op Assessment: Report given to RN  Post vital signs: stable  Last Vitals:  Vitals Value Taken Time  BP    Temp    Pulse    Resp    SpO2      Last Pain:  Vitals:   03/20/24 1015  TempSrc: Temporal  PainSc: 6       Patients Stated Pain Goal: 0 (03/20/24 1015)  Complications: No notable events documented.

## 2024-03-20 NOTE — Interval H&P Note (Signed)
 History and Physical Interval Note:  03/20/2024 11:26 AM  Judith Drake  has presented today for surgery, with the diagnosis of positive Cologuard.  The various methods of treatment have been discussed with the patient and family. After consideration of risks, benefits and other options for treatment, the patient has consented to  Procedure(s): COLONOSCOPY (N/A) as a surgical intervention.  The patient's history has been reviewed, patient examined, no change in status, stable for surgery.  I have reviewed the patient's chart and labs.  Questions were answered to the patient's satisfaction.     Judith Drake  Ok to proceed with colonoscopy

## 2024-03-20 NOTE — H&P (Signed)
 Outpatient short stay form Pre-procedure 03/20/2024  Ole ONEIDA Schick, MD  Primary Physician: Dineen Rollene MATSU, FNP  Reason for visit:  Positive cologuard  History of present illness:    53 y/o lady with history of hypertension and alcohol use here for colonoscopy due to positive cologuard. Never had colonoscopy before. No blood thinners. Grandparent with colon cancer. History of tubal ligation.    Current Facility-Administered Medications:    0.9 %  sodium chloride  infusion, , Intravenous, Continuous, Raivyn Kabler, Ole ONEIDA, MD, Last Rate: 20 mL/hr at 03/20/24 1019, Continued from Pre-op at 03/20/24 1019  Medications Prior to Admission  Medication Sig Dispense Refill Last Dose/Taking   amLODipine  (NORVASC ) 10 MG tablet Take 1 tablet (10 mg total) by mouth daily. 90 tablet 3 03/19/2024   DULoxetine  (CYMBALTA ) 30 MG capsule Take 3 capsules (90 mg total) by mouth daily.   03/19/2024   hydrochlorothiazide  (HYDRODIURIL ) 25 MG tablet Take 25 mg by mouth daily.   03/19/2024   lisinopril  (ZESTRIL ) 10 MG tablet Take 10 mg by mouth daily.   03/19/2024   meloxicam  (MOBIC ) 7.5 MG tablet TAKE ONE (1) TO TWO (2) TABLETS (7.5-15 MG TOTAL) BY MOUTH DAILY AS NEEDED FOR PAIN. 60 tablet 2 03/19/2024   omeprazole  (PRILOSEC) 20 MG capsule TAKE ONE CAPSULE (20 MG TOTAL) BY MOUTH DAILY. 90 capsule 0 03/19/2024   Aspirin -Salicylamide-Caffeine (BC HEADACHE POWDER PO) Take 1 packet by mouth 2 (two) times daily as needed (for pain, headaches, or discomfort).      cetirizine  (ZYRTEC ) 10 MG tablet Take 1 tablet (10 mg total) by mouth daily. (Patient taking differently: Take 10 mg by mouth daily as needed for allergies or rhinitis.) 30 tablet 0    ondansetron  (ZOFRAN ) 4 MG tablet Take 1 tablet (4 mg total) by mouth every 8 (eight) hours as needed for nausea or vomiting. 12 tablet 0      Allergies  Allergen Reactions   Amoxicillin Nausea And Vomiting    Has patient had a PCN reaction causing immediate rash,  facial/tongue/throat swelling, SOB or lightheadedness with hypotension: No Has patient had a PCN reaction causing severe rash involving mucus membranes or skin necrosis: No Has patient had a PCN reaction that required hospitalization: No Has patient had a PCN reaction occurring within the last 10 years: No If all of the above answers are NO, then may proceed with Cephalosporin use.    Dilaudid  [Hydromorphone  Hcl] Itching   Morphine  And Codeine Other (See Comments)    Hallucinations      Past Medical History:  Diagnosis Date   Abnormal uterine bleeding (AUB) 03/20/2019   Acute lower UTI 05/12/2019   Acute respiratory failure with hypoxia (HCC) 04/10/2018   Anemia    Arthritis    lower back and hips   Dysmenorrhea 03/20/2019   Elevated liver enzymes 11/24/2013   ETOH abuse    Fatty liver 10/03/2019   Fibroid    GERD (gastroesophageal reflux disease)    Heavy menstrual period    Hypertension    Menorrhagia 12/13/2017   SIRS (systemic inflammatory response syndrome) (HCC) 05/12/2019    Review of systems:  Otherwise negative.    Physical Exam  Gen: Alert, oriented. Appears stated age.  HEENT: PERRLA. Lungs: No respiratory distress CV: RRR Abd: soft, benign, no masses Ext: No edema    Planned procedures: Proceed with colonoscopy. The patient understands the nature of the planned procedure, indications, risks, alternatives and potential complications including but not limited to bleeding, infection, perforation, damage to  internal organs and possible oversedation/side effects from anesthesia. The patient agrees and gives consent to proceed.  Please refer to procedure notes for findings, recommendations and patient disposition/instructions.     Ole ONEIDA Schick, MD [facility]

## 2024-03-21 LAB — SURGICAL PATHOLOGY

## 2024-03-21 NOTE — Anesthesia Postprocedure Evaluation (Signed)
 Anesthesia Post Note  Patient: Judith Drake  Procedure(s) Performed: COLONOSCOPY POLYPECTOMY, INTESTINE  Patient location during evaluation: Endoscopy Anesthesia Type: General Level of consciousness: awake and alert Pain management: pain level controlled Vital Signs Assessment: post-procedure vital signs reviewed and stable Respiratory status: spontaneous breathing, nonlabored ventilation, respiratory function stable and patient connected to nasal cannula oxygen Cardiovascular status: blood pressure returned to baseline and stable Postop Assessment: no apparent nausea or vomiting Anesthetic complications: no   No notable events documented.   Last Vitals:  Vitals:   03/20/24 1213 03/20/24 1217  BP:    Pulse: (!) 117 (!) 113  Resp: 19 20  Temp: (!) 36.4 C 36.6 C  SpO2: 95% 97%    Last Pain:  Vitals:   03/20/24 1217  TempSrc:   PainSc: 0-No pain                 Prentice Murphy

## 2024-04-20 ENCOUNTER — Ambulatory Visit: Admitting: Family

## 2024-04-20 ENCOUNTER — Telehealth: Payer: Self-pay | Admitting: Family

## 2024-04-20 ENCOUNTER — Encounter: Payer: Self-pay | Admitting: Family

## 2024-04-20 VITALS — BP 128/80 | HR 105 | Temp 98.4°F | Ht 63.0 in | Wt 164.4 lb

## 2024-04-20 DIAGNOSIS — K148 Other diseases of tongue: Secondary | ICD-10-CM

## 2024-04-20 DIAGNOSIS — Z780 Asymptomatic menopausal state: Secondary | ICD-10-CM

## 2024-04-20 DIAGNOSIS — K1370 Unspecified lesions of oral mucosa: Secondary | ICD-10-CM

## 2024-04-20 MED ORDER — AMOXICILLIN-POT CLAVULANATE 875-125 MG PO TABS: 1.0000 | ORAL_TABLET | Freq: Two times a day (BID) | ORAL | 0 refills | Status: AC

## 2024-04-20 NOTE — Telephone Encounter (Signed)
 Judith Drake pool Stat ct neck ordered Can this be scheduled tomorrrow? Please notify referral pool through [employer]

## 2024-04-20 NOTE — Progress Notes (Signed)
 Assessment & Plan:  Oral lesion Assessment & Plan: Right lateral tongue lesion and concern for associated right submental lymphadenopathy   D/D includes abrasion from rough edge of molar, syphilis, dental abscess, malignant skin lesion.  Pending stat CT head and neck with contrast, viral swab of the tongue lesion . If the lesion persists.  Initially we discussed starting cefdinir with metronidazole  to treat for potential dental abscess.  Due to the patient's alcohol use, we jointly agreed metronidazole  and alcohol would be unsafe.  She is willing to try augmentin . Advised to take with food and let me know right away if she has any vomiting, nausea.  Consider ENT consult  Orders: -     CBC with Differential/Platelet -     RPR (MONITOR) W/REFL -     Herpes simplex virus culture -     CT SOFT TISSUE NECK W CONTRAST; Future -     Amoxicillin -Pot Clavulanate; Take 1 tablet by mouth 2 (two) times daily for 7 days.  Dispense: 14 tablet; Refill: 0  Asymptomatic postmenopausal state -     DG Bone Density; Future     Return precautions given.   Risks, benefits, and alternatives of the medications and treatment plan prescribed today were discussed, and patient expressed understanding.   Education regarding symptom management and diagnosis given to patient on AVS either electronically or printed.  Return in about 3 months (around 07/21/2024).  Rollene Northern, FNP  Subjective:    Patient ID: Judith Drake, female    DOB: Apr 04, 1971, 53 y.o.   MRN: [id number]  CC: Judith Drake is a 53 y.o. female who presents today for follow up.   HPI: HPI Discussed the use of AI scribe software for clinical note transcription with the patient, who gave verbal consent to proceed.  History of Present Illness   Judith Drake is a 53 year old female who presents with a tongue lesion and a palpable lymph node.  She has a painful lesion on the right side of her tongue. Initially thought  related to a fall a couple of months ago when she broke her wrist.A cap came off her back right lower molar, which has a rigid edge that rubs against the lesion.  She has noticed a palpable 'knot,' under her right side of chin. Denies dental pain, bleeding or purulence from the oral lesion.  She has not seen a dentist in a while due to lack of dental insurance. She does not currently have a toothache.  During pregnancy 20 + years ago, she took amoxicillin  and had nausea and vomiting.  Denies rash, shortness of breath, trouble swallowing on amoxicillin .  She has not taken amoxicillin  since. She is drinking alcohol at this time.  She smokes about half a pack of cigarettes a day and denies the use of chewing tobacco or vaping. She has a history of syphilis, with titers that were decreasing as of her last check in June. She recently underwent a colonoscopy on April 20, 2024, where one polyp was removed. She is due for a mammogram and bone density scan, which she has not yet scheduled.      Smoker, half pack per per day No chew, vaping She reports obscure h/o skin excision on chest, approx ; she is not sure if malignant.  No recent dental exam.  Allergies: Amoxicillin , Dilaudid  [hydromorphone  hcl], and Morphine  and codeine Current Outpatient Medications on File Prior to Visit  Medication Sig Dispense Refill   amLODipine  (NORVASC )  10 MG tablet Take 1 tablet (10 mg total) by mouth daily. 90 tablet 3   Aspirin -Salicylamide-Caffeine (BC HEADACHE POWDER PO) Take 1 packet by mouth 2 (two) times daily as needed (for pain, headaches, or discomfort).     DULoxetine  (CYMBALTA ) 30 MG capsule Take 3 capsules (90 mg total) by mouth daily.     meloxicam  (MOBIC ) 7.5 MG tablet TAKE ONE (1) TO TWO (2) TABLETS (7.5-15 MG TOTAL) BY MOUTH DAILY AS NEEDED FOR PAIN. 60 tablet 2   omeprazole  (PRILOSEC) 20 MG capsule TAKE ONE CAPSULE (20 MG TOTAL) BY MOUTH DAILY. 90 capsule 0   ondansetron  (ZOFRAN ) 4 MG tablet Take 1  tablet (4 mg total) by mouth every 8 (eight) hours as needed for nausea or vomiting. 12 tablet 0   cetirizine  (ZYRTEC ) 10 MG tablet Take 1 tablet (10 mg total) by mouth daily. (Patient not taking: Reported on 04/20/2024) 30 tablet 0   No current facility-administered medications on file prior to visit.    Review of Systems  Constitutional:  Negative for chills and fever.  HENT:  Negative for sore throat and voice change.   Respiratory:  Negative for cough.   Cardiovascular:  Negative for chest pain and palpitations.  Gastrointestinal:  Negative for nausea and vomiting.  Skin:  Positive for wound (right tongue).      Objective:    BP 128/80   Pulse (!) 105   Temp 98.4 F (36.9 C) (Oral)   Ht 5' 3 (1.6 m)   Wt 164 lb 6.4 oz (74.6 kg)   LMP  (LMP Unknown)   SpO2 96%   BMI 29.12 kg/m  BP Readings from Last 3 Encounters:  04/20/24 128/80  03/20/24 122/79  03/01/24 128/82   Wt Readings from Last 3 Encounters:  04/20/24 164 lb 6.4 oz (74.6 kg)  03/20/24 160 lb 4.8 oz (72.7 kg)  03/01/24 166 lb (75.3 kg)    Physical Exam Vitals reviewed.  Constitutional:      Appearance: She is well-developed.  Eyes:     Conjunctiva/sclera: Conjunctivae normal.  Cardiovascular:     Rate and Rhythm: Normal rate and regular rhythm.     Pulses: Normal pulses.     Heart sounds: Normal heart sounds.  Pulmonary:     Effort: Pulmonary effort is normal.     Breath sounds: Normal breath sounds. No wheezing, rhonchi or rales.  Lymphadenopathy:     Head:     Right side of head: Submandibular adenopathy present. No submental, tonsillar, preauricular, posterior auricular or occipital adenopathy.     Left side of head: No submental, submandibular, tonsillar, preauricular, posterior auricular or occipital adenopathy.     Cervical:     Right cervical: No superficial cervical adenopathy.    Left cervical: No superficial cervical adenopathy.  Skin:    General: Skin is warm and dry.  Neurological:      Mental Status: She is alert.  Psychiatric:        Speech: Speech normal.        Behavior: Behavior normal.        Thought Content: Thought content normal.

## 2024-04-20 NOTE — Assessment & Plan Note (Signed)
 Right lateral tongue lesion and concern for associated right submental lymphadenopathy   D/D includes abrasion from rough edge of molar, syphilis, dental abscess, malignant skin lesion.  Pending stat CT head and neck with contrast, viral swab of the tongue lesion . If the lesion persists.  Initially we discussed starting cefdinir with metronidazole  to treat for potential dental abscess.  Due to the patient's alcohol use, we jointly agreed metronidazole  and alcohol would be unsafe.  She is willing to try augmentin . Advised to take with food and let me know right away if she has any vomiting, nausea.  Consider ENT consult

## 2024-04-20 NOTE — Telephone Encounter (Signed)
 Call [facility], dr chrystie Need pathology from chest biopsy from approx 2020 Pt unsure if cancerous She also needs f/u surveillance appt with her

## 2024-04-20 NOTE — Patient Instructions (Addendum)
 [email or web address]   Stat CT head and neck.  Pending labs, viral swab.  Please let me know if any symptom changes.Start Augmentin  with probiotics.

## 2024-04-21 ENCOUNTER — Other Ambulatory Visit

## 2024-04-21 ENCOUNTER — Other Ambulatory Visit (INDEPENDENT_AMBULATORY_CARE_PROVIDER_SITE_OTHER)

## 2024-04-21 ENCOUNTER — Ambulatory Visit
Admission: RE | Admit: 2024-04-21 | Discharge: 2024-04-21 | Disposition: A | Discharge status: Home / Self Care | Source: Ambulatory Visit | Attending: Family | Admitting: Family

## 2024-04-21 DIAGNOSIS — Z113 Encounter for screening for infections with a predominantly sexual mode of transmission: Secondary | ICD-10-CM | POA: Diagnosis not present

## 2024-04-21 DIAGNOSIS — R599 Enlarged lymph nodes, unspecified: Secondary | ICD-10-CM | POA: Diagnosis not present

## 2024-04-21 DIAGNOSIS — K1370 Unspecified lesions of oral mucosa: Secondary | ICD-10-CM | POA: Insufficient documentation

## 2024-04-21 MED ORDER — IOHEXOL 300 MG/ML  SOLN
75.0000 mL | Freq: Once | INTRAMUSCULAR | Status: AC | PRN
  Administered 2024-04-21: 75 mL via INTRAVENOUS

## 2024-04-21 NOTE — Addendum Note (Signed)
 Addended by: DINEEN ROLLENE MATSU on: 04/21/2024 08:17 AM   Modules accepted: Orders

## 2024-04-21 NOTE — Telephone Encounter (Signed)
 [facility] is working on it now in referral pool

## 2024-04-21 NOTE — Telephone Encounter (Signed)
 Office is closed on Friday will try back again on Monday

## 2024-04-21 NOTE — Addendum Note (Signed)
 Addended by: DINEEN ROLLENE MATSU on: 04/21/2024 08:08 AM   Modules accepted: Orders

## 2024-04-23 ENCOUNTER — Ambulatory Visit: Payer: Self-pay | Admitting: Family

## 2024-04-23 DIAGNOSIS — K1370 Unspecified lesions of oral mucosa: Secondary | ICD-10-CM

## 2024-04-23 MED ORDER — VALACYCLOVIR HCL 1 G PO TABS: ORAL_TABLET | ORAL | 2 refills | Status: DC

## 2024-04-24 NOTE — Telephone Encounter (Signed)
 Spoke to [facility] he stated that the pt needs to come there and signed ROI form and she will need a referral as well

## 2024-04-25 LAB — HERPES SIMPLEX VIRUS CULTURE

## 2024-04-25 LAB — TIQ-NTM

## 2024-04-26 LAB — RPR: RPR Ser Ql: REACTIVE — AB

## 2024-04-26 LAB — HSV(HERPES SIMPLEX VRS) I + II AB-IGG
HSV 1 IGG,TYPE SPECIFIC AB: >58 {index} — ABNORMAL HIGH
HSV 2 IGG,TYPE SPECIFIC AB: <0.9 {index}

## 2024-04-26 LAB — RPR TITER: RPR Titer: 1:8 {titer} — ABNORMAL HIGH

## 2024-04-26 LAB — T PALLIDUM AB: T Pallidum Abs: POSITIVE — AB

## 2024-04-28 NOTE — Telephone Encounter (Signed)
 LVM to call back to inform pt needs to  go to [facility] and sign ROI form referral has been placed as well

## 2024-04-28 NOTE — Addendum Note (Signed)
 Addended by: Loy Mccartt on: 04/28/2024 08:39 AM   Modules accepted: Orders

## 2024-04-28 NOTE — Telephone Encounter (Signed)
 Noted Please update pt  Place dermatology referral re: tongue lesion, overall skin check

## 2024-04-28 NOTE — Telephone Encounter (Unsigned)
 Copied from CRM #[id number]. Topic: General - Other >> Apr 28, 2024  2:34 PM [place] J wrote: Reason for CRM: Patient returning a call from the clinic. She is aware of her Dermatology referral being sent in today.

## 2024-05-12 ENCOUNTER — Telehealth: Payer: Self-pay | Admitting: Family

## 2024-05-12 ENCOUNTER — Other Ambulatory Visit: Payer: Self-pay | Admitting: Family

## 2024-05-12 DIAGNOSIS — F419 Anxiety disorder, unspecified: Secondary | ICD-10-CM

## 2024-05-12 NOTE — Telephone Encounter (Signed)
 Copied from CRM #[id number]. Topic: Clinical - Medication Question >> May 12, 2024  4:32 PM Drema MATSU wrote: Reason for CRM: [place] with is calling to verify if provider meant to send a 90 days supply for DULoxetine  (CYMBALTA ) 30 MG capsule instead of 45. She said that it needs to be a quantity of 180 if it is 90.

## 2024-05-16 ENCOUNTER — Other Ambulatory Visit: Payer: Self-pay | Admitting: Family

## 2024-05-16 DIAGNOSIS — F32A Depression, unspecified: Secondary | ICD-10-CM

## 2024-05-16 MED ORDER — DULOXETINE HCL 30 MG PO CPEP: ORAL_CAPSULE | ORAL | 3 refills | Status: AC

## 2024-05-17 NOTE — Telephone Encounter (Signed)
 Called the pharmacy and they said they would discontinue the one that was sent on 9/5 for 45 days worth instead of the 90 days.

## 2024-05-19 ENCOUNTER — Other Ambulatory Visit: Payer: Self-pay | Admitting: Nurse Practitioner

## 2024-05-19 DIAGNOSIS — K219 Gastro-esophageal reflux disease without esophagitis: Secondary | ICD-10-CM

## 2024-05-19 DIAGNOSIS — R590 Localized enlarged lymph nodes: Secondary | ICD-10-CM | POA: Diagnosis not present

## 2024-05-19 DIAGNOSIS — D3702 Neoplasm of uncertain behavior of tongue: Secondary | ICD-10-CM | POA: Diagnosis not present

## 2024-05-19 DIAGNOSIS — K140 Glossitis: Secondary | ICD-10-CM | POA: Diagnosis not present

## 2024-05-19 DIAGNOSIS — D101 Benign neoplasm of tongue: Secondary | ICD-10-CM | POA: Diagnosis not present

## 2024-07-10 ENCOUNTER — Encounter: Payer: Self-pay | Admitting: Radiology

## 2024-07-19 ENCOUNTER — Ambulatory Visit
Admission: RE | Admit: 2024-07-19 | Discharge: 2024-07-19 | Disposition: A | Discharge status: Home / Self Care | Source: Ambulatory Visit | Attending: Family | Admitting: Family

## 2024-07-19 DIAGNOSIS — Z780 Asymptomatic menopausal state: Secondary | ICD-10-CM | POA: Insufficient documentation

## 2024-07-19 DIAGNOSIS — Z1231 Encounter for screening mammogram for malignant neoplasm of breast: Secondary | ICD-10-CM | POA: Diagnosis not present

## 2024-07-19 DIAGNOSIS — M8588 Other specified disorders of bone density and structure, other site: Secondary | ICD-10-CM | POA: Diagnosis not present

## 2024-07-20 ENCOUNTER — Ambulatory Visit: Admitting: Family

## 2024-07-25 ENCOUNTER — Other Ambulatory Visit: Payer: Self-pay | Admitting: Family

## 2024-07-25 DIAGNOSIS — R928 Other abnormal and inconclusive findings on diagnostic imaging of breast: Secondary | ICD-10-CM

## 2024-07-27 ENCOUNTER — Ambulatory Visit: Admitting: Family

## 2024-07-27 ENCOUNTER — Ambulatory Visit: Payer: Self-pay | Admitting: Family

## 2024-07-31 ENCOUNTER — Ambulatory Visit
Admission: RE | Admit: 2024-07-31 | Discharge: 2024-07-31 | Disposition: A | Source: Ambulatory Visit | Attending: Family

## 2024-07-31 ENCOUNTER — Ambulatory Visit
Admission: RE | Admit: 2024-07-31 | Discharge: 2024-07-31 | Disposition: A | Discharge status: Home / Self Care | Source: Ambulatory Visit | Attending: Family | Admitting: Family

## 2024-07-31 DIAGNOSIS — R921 Mammographic calcification found on diagnostic imaging of breast: Secondary | ICD-10-CM | POA: Diagnosis not present

## 2024-07-31 DIAGNOSIS — R928 Other abnormal and inconclusive findings on diagnostic imaging of breast: Secondary | ICD-10-CM

## 2024-07-31 DIAGNOSIS — N6312 Unspecified lump in the right breast, upper inner quadrant: Secondary | ICD-10-CM | POA: Diagnosis not present

## 2024-07-31 DIAGNOSIS — R920 Mammographic microcalcification found on diagnostic imaging of breast: Secondary | ICD-10-CM | POA: Diagnosis not present

## 2024-08-01 ENCOUNTER — Encounter: Payer: Self-pay | Admitting: Family

## 2024-08-01 ENCOUNTER — Other Ambulatory Visit: Payer: Self-pay | Admitting: Family

## 2024-08-01 DIAGNOSIS — R928 Other abnormal and inconclusive findings on diagnostic imaging of breast: Secondary | ICD-10-CM

## 2024-08-02 ENCOUNTER — Ambulatory Visit: Payer: Self-pay | Admitting: Family

## 2024-08-07 ENCOUNTER — Ambulatory Visit
Admission: RE | Admit: 2024-08-07 | Discharge: 2024-08-07 | Disposition: A | Source: Ambulatory Visit | Attending: Family

## 2024-08-07 ENCOUNTER — Ambulatory Visit
Admission: RE | Admit: 2024-08-07 | Discharge: 2024-08-07 | Disposition: A | Discharge status: Home / Self Care | Source: Ambulatory Visit | Attending: Family | Admitting: Family

## 2024-08-07 DIAGNOSIS — R921 Mammographic calcification found on diagnostic imaging of breast: Secondary | ICD-10-CM | POA: Diagnosis not present

## 2024-08-07 DIAGNOSIS — R928 Other abnormal and inconclusive findings on diagnostic imaging of breast: Secondary | ICD-10-CM | POA: Insufficient documentation

## 2024-08-07 DIAGNOSIS — N6011 Diffuse cystic mastopathy of right breast: Secondary | ICD-10-CM | POA: Insufficient documentation

## 2024-08-07 DIAGNOSIS — N6021 Fibroadenosis of right breast: Secondary | ICD-10-CM | POA: Insufficient documentation

## 2024-08-07 DIAGNOSIS — C50211 Malignant neoplasm of upper-inner quadrant of right female breast: Secondary | ICD-10-CM | POA: Diagnosis not present

## 2024-08-07 HISTORY — PX: BREAST BIOPSY: SHX20

## 2024-08-07 MED ORDER — LIDOCAINE 1 % OPTIME INJ - NO CHARGE
1.0000 mL | Freq: Once | INTRAMUSCULAR | Status: AC
  Administered 2024-08-07: 1 mL
  Filled 2024-08-07: qty 2

## 2024-08-07 MED ORDER — LIDOCAINE 1 % OPTIME INJ - NO CHARGE
5.0000 mL | Freq: Once | INTRAMUSCULAR | Status: AC
  Administered 2024-08-07: 5 mL
  Filled 2024-08-07: qty 6

## 2024-08-07 MED ORDER — LIDOCAINE-EPINEPHRINE 1 %-1:100000 IJ SOLN
10.0000 mL | Freq: Once | INTRAMUSCULAR | Status: AC
  Administered 2024-08-07: 10 mL
  Filled 2024-08-07: qty 10

## 2024-08-07 MED ORDER — LIDOCAINE-EPINEPHRINE 1 %-1:100000 IJ SOLN
8.0000 mL | Freq: Once | INTRAMUSCULAR | Status: AC
  Administered 2024-08-07: 8 mL
  Filled 2024-08-07: qty 8

## 2024-08-07 MED ORDER — LIDOCAINE HCL 1 % IJ SOLN
5.0000 mL | Freq: Once | INTRAMUSCULAR | Status: AC
  Administered 2024-08-07: 5 mL
  Filled 2024-08-07: qty 5

## 2024-08-08 ENCOUNTER — Encounter: Payer: Self-pay | Admitting: *Deleted

## 2024-08-08 DIAGNOSIS — C50911 Malignant neoplasm of unspecified site of right female breast: Secondary | ICD-10-CM

## 2024-08-08 LAB — SURGICAL PATHOLOGY

## 2024-08-08 NOTE — Progress Notes (Signed)
 Received referral for newly diagnosed breast cancer from [facility].  Navigation initiated.  Referral placed to [facility] per patient preference, their office will call with an appointment.   She will see Dr. Babara on Thursday 12/4 at 3:00.

## 2024-08-09 ENCOUNTER — Telehealth: Payer: Self-pay | Admitting: Family

## 2024-08-09 DIAGNOSIS — F419 Anxiety disorder, unspecified: Secondary | ICD-10-CM

## 2024-08-09 MED ORDER — BUSPIRONE HCL 5 MG PO TABS: 5.0000 mg | ORAL_TABLET | Freq: Three times a day (TID) | ORAL | 1 refills | Status: AC | PRN

## 2024-08-09 NOTE — Telephone Encounter (Signed)
 Spoke to pt She is feeling okay this morning Discussed biopsy results, positive right breast cancer.  She is scheduled with oncology, general surgery. Endorses anxiety and some trouble sleeping. She has been on BuSpar  in the past.  Follow-up with me 08/17/2024.  Start BuSpar  5 mg 3 times daily as needed.  She will let me know if she is anything else in the interim.

## 2024-08-10 ENCOUNTER — Inpatient Hospital Stay

## 2024-08-10 ENCOUNTER — Encounter: Payer: Self-pay | Admitting: *Deleted

## 2024-08-10 ENCOUNTER — Inpatient Hospital Stay: Attending: Oncology | Admitting: Oncology

## 2024-08-10 ENCOUNTER — Other Ambulatory Visit: Payer: Self-pay

## 2024-08-10 ENCOUNTER — Encounter: Payer: Self-pay | Admitting: Oncology

## 2024-08-10 VITALS — BP 147/92 | HR 105 | Temp 98.6°F | Resp 20 | Wt 174.8 lb

## 2024-08-10 DIAGNOSIS — Z885 Allergy status to narcotic agent status: Secondary | ICD-10-CM | POA: Insufficient documentation

## 2024-08-10 DIAGNOSIS — Z801 Family history of malignant neoplasm of trachea, bronchus and lung: Secondary | ICD-10-CM | POA: Diagnosis not present

## 2024-08-10 DIAGNOSIS — I10 Essential (primary) hypertension: Secondary | ICD-10-CM | POA: Insufficient documentation

## 2024-08-10 DIAGNOSIS — Z170 Estrogen receptor positive status [ER+]: Secondary | ICD-10-CM | POA: Diagnosis not present

## 2024-08-10 DIAGNOSIS — Z79624 Long term (current) use of inhibitors of nucleotide synthesis: Secondary | ICD-10-CM | POA: Insufficient documentation

## 2024-08-10 DIAGNOSIS — C50911 Malignant neoplasm of unspecified site of right female breast: Secondary | ICD-10-CM | POA: Diagnosis not present

## 2024-08-10 DIAGNOSIS — F1090 Alcohol use, unspecified, uncomplicated: Secondary | ICD-10-CM

## 2024-08-10 DIAGNOSIS — Z800 Family history of malignant neoplasm of digestive organs: Secondary | ICD-10-CM | POA: Diagnosis not present

## 2024-08-10 DIAGNOSIS — Z90710 Acquired absence of both cervix and uterus: Secondary | ICD-10-CM | POA: Insufficient documentation

## 2024-08-10 DIAGNOSIS — Z1732 Human epidermal growth factor receptor 2 negative status: Secondary | ICD-10-CM | POA: Diagnosis not present

## 2024-08-10 DIAGNOSIS — C50211 Malignant neoplasm of upper-inner quadrant of right female breast: Secondary | ICD-10-CM | POA: Insufficient documentation

## 2024-08-10 DIAGNOSIS — Z87440 Personal history of urinary (tract) infections: Secondary | ICD-10-CM | POA: Insufficient documentation

## 2024-08-10 DIAGNOSIS — Z9079 Acquired absence of other genital organ(s): Secondary | ICD-10-CM | POA: Insufficient documentation

## 2024-08-10 DIAGNOSIS — Z809 Family history of malignant neoplasm, unspecified: Secondary | ICD-10-CM | POA: Insufficient documentation

## 2024-08-10 DIAGNOSIS — D72829 Elevated white blood cell count, unspecified: Secondary | ICD-10-CM | POA: Insufficient documentation

## 2024-08-10 DIAGNOSIS — R7401 Elevation of levels of liver transaminase levels: Secondary | ICD-10-CM | POA: Diagnosis not present

## 2024-08-10 DIAGNOSIS — Z880 Allergy status to penicillin: Secondary | ICD-10-CM | POA: Insufficient documentation

## 2024-08-10 DIAGNOSIS — Z79899 Other long term (current) drug therapy: Secondary | ICD-10-CM | POA: Insufficient documentation

## 2024-08-10 DIAGNOSIS — Z1721 Progesterone receptor positive status: Secondary | ICD-10-CM | POA: Diagnosis not present

## 2024-08-10 DIAGNOSIS — F17210 Nicotine dependence, cigarettes, uncomplicated: Secondary | ICD-10-CM | POA: Diagnosis not present

## 2024-08-10 LAB — COMPREHENSIVE METABOLIC PANEL WITH GFR
ALT: 51 U/L — ABNORMAL HIGH (ref 0–44)
AST: 81 U/L — ABNORMAL HIGH (ref 15–41)
Albumin: 4.8 g/dL (ref 3.5–5.0)
Alkaline Phosphatase: 166 U/L — ABNORMAL HIGH (ref 38–126)
Anion gap: 14 (ref 5–15)
BUN: 9 mg/dL (ref 6–20)
CO2: 33 mmol/L — ABNORMAL HIGH (ref 22–32)
Calcium: 10.2 mg/dL (ref 8.9–10.3)
Chloride: 93 mmol/L — ABNORMAL LOW (ref 98–111)
Creatinine, Ser: 0.61 mg/dL (ref 0.44–1.00)
GFR, Estimated: >60 mL/min (ref >60)
Glucose, Bld: 124 mg/dL — ABNORMAL HIGH (ref 70–99)
Potassium: 3.8 mmol/L (ref 3.5–5.1)
Sodium: 139 mmol/L (ref 135–145)
Total Bilirubin: 0.6 mg/dL (ref 0.0–1.2)
Total Protein: 8.6 g/dL — ABNORMAL HIGH (ref 6.5–8.1)

## 2024-08-10 LAB — CBC WITH DIFFERENTIAL/PLATELET
Abs Immature Granulocytes: 0.05 K/uL (ref 0.00–0.07)
Basophils Absolute: 0.1 K/uL (ref 0.0–0.1)
Basophils Relative: 1 %
Eosinophils Absolute: 0.1 K/uL (ref 0.0–0.5)
Eosinophils Relative: 1 %
HCT: 41.1 % (ref 36.0–46.0)
Hemoglobin: 14.2 g/dL (ref 12.0–15.0)
Immature Granulocytes: 0 %
Lymphocytes Relative: 16 %
Lymphs Abs: 1.9 K/uL (ref 0.7–4.0)
MCH: 33.5 pg (ref 26.0–34.0)
MCHC: 34.5 g/dL (ref 30.0–36.0)
MCV: 96.9 fL (ref 80.0–100.0)
Monocytes Absolute: 1 K/uL (ref 0.1–1.0)
Monocytes Relative: 9 %
Neutro Abs: 8.9 K/uL — ABNORMAL HIGH (ref 1.7–7.7)
Neutrophils Relative %: 73 %
Platelets: 351 K/uL (ref 150–400)
RBC: 4.24 MIL/uL (ref 3.87–5.11)
RDW: 12.2 % (ref 11.5–15.5)
WBC: 12.1 K/uL — ABNORMAL HIGH (ref 4.0–10.5)
nRBC: 0 % (ref 0.0–0.2)

## 2024-08-10 LAB — GENETIC SCREENING ORDER

## 2024-08-10 NOTE — Progress Notes (Signed)
 Hematology/Oncology Consult Note [phone] [phone]     REFERRING PROVIDER: Dineen Rollene MATSU, FNP    CHIEF COMPLAINTS/PURPOSE OF CONSULTATION:  Right breast IDC, ER/PR +, HER2 -  ASSESSMENT & PLAN:   Invasive ductal carcinoma of breast, female, right [facility]) Pathology and radiology counseling: Discussed with the patient, the details of pathology including the type of breast cancer,the clinical staging, the significance of ER, PR and HER-2/neu receptors and the implications for treatment. After reviewing the pathology in detail, we proceeded to discuss the different treatment options between surgery, radiation, chemotherapy, antiestrogen therapies. 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  Bilateral MRI breasts w wo contrast. Stereotactic biopsy of right breast calcifications  2   establish care with surgery for resection 3   Possible Oncotype DX testing on the surgical specimen to determine if she would benefit from adjuvant chemo 4.  Adjuvant radiation 5.  Adjuvant antiestrogen therapy  Return to clinic based upon surgery pathology and Oncotype DX test result   Family history of cancer Recommend genetic testing.   Alcohol use Recommend alcohol cessation  Elevated transaminase level Chronic, due to alcohol use and fatty liver.  Avoid alcohol cessation  Leukocytosis Possibly due to smoking.  Monitor, can consider work up in the future   Orders Placed This Encounter  Procedures   CBC with Differential/Platelet    Standing Status:   Future    Number of Occurrences:   1    Expected Date:   08/10/2024    Expiration Date:   11/08/2024   Comprehensive metabolic panel with GFR    Standing Status:   Future    Number of Occurrences:   1    Expected Date:   08/10/2024    Expiration Date:   11/08/2024   Cancer antigen 15-3    Standing Status:   Future    Number of Occurrences:   1    Expected Date:   08/10/2024    Expiration  Date:   11/08/2024   Cancer antigen 27.29    Standing Status:   Future    Number of Occurrences:   1    Expected Date:   08/10/2024    Expiration Date:   11/08/2024   Follow up TBD All questions were answered. The patient knows to call the clinic with any problems, questions or concerns.  Zelphia Cap, MD, PhD [facility] Hematology Oncology 08/10/2024    HISTORY OF PRESENTING ILLNESS:  Judith Drake 53 y.o. female presents to establish care for Right IDC ER 100%, PR 95% HER2 (1+) I have reviewed her chart and materials related to her cancer extensively and collaborated history with the patient. Summary of oncologic history is as follows: Oncology History  Invasive ductal carcinoma of breast, female, right (HCC)  07/25/2024 Mammogram   B/L screening mammogram showed Further evaluation is suggested for possible distortion, separate asymmetries and calcifications in the right breast   07/31/2024 Mammogram   Right breast diagostic mammogram 1. Highly suspicious architectural distortion in the RIGHT breast at 1 o'clock with associated microcalcifications, correlating with a 5 mm spiculated mass on ultrasound. Ultrasound-guided biopsy is recommended.   2. Suspicious segmental microcalcifications involving the upper inner quadrant of the RIGHT breast spanning approximately 8 x 7 x 6 cm. Recommend ultrasound-guided biopsy of amorphous calcifications near the anterior extent at the discretion of the performing radiologist; the architectural distortion and calcifications (recommended for ultrasound-guided biopsy above) provides a reasonable approximation of  the calcifications' posterior extent.   3. Grouped amorphous calcifications spanning 4 mm in the LATERAL RIGHT breast approximate 9 o'clock position. Recommend stereotactic biopsy of these calcifications at the time of the above recommended biopsies.   4.  No RIGHT axillary lymphadenopathy seen on ultrasound.   08/07/2024 Initial  Diagnosis   Invasive ductal carcinoma of breast, female, right [facility])  Patient underwent right breast biopsy  1. Breast, right, needle core biopsy, 1 o'clock 6cm (coil clip) :      INVASIVE DUCTAL CARCINOMA      TUBULE FORMATION: SCORE 1      NUCLEAR PLEOMORPHISM: SCORE 1      MITOTIC COUNT: SCORE 1      TOTAL SCORE: 3      OVERALL GRADE: 1      LYMPHOVASCULAR INVASION: NOT IDENTIFIED      CANCER LENGTH: 8 MM / 0.8 MM      CALCIFICATIONS: NOT IDENTIFIED      OTHER FINDINGS: NONE      ER 100%, PR 95% HER2 (1+)       2. Breast, right, needle core biopsy, UIQ anterior (x clip) :      BENIGN BREAST TISSUE WITH FIBROCYSTIC CHANGES INCLUDING STROMAL FIBROSIS, CYSTS,      USUAL DUCTAL HYPERPLASIA AND CALCIFICATIONS.      NEGATIVE FOR ATYPIA OR MALIGNANCY.       3. Breast, right, needle core biopsy, lateral (ribbon clip) :      BENIGN BREAST TISSUE WITH FIBROCYSTIC CHANGES INCLUDING STROMAL FIBROSIS,      SCLEROSING ADENOSIS, CYSTS, USUAL DUCTAL HYPERPLASIA AND CALCIFICATIONS.      NEGATIVE FOR ATYPIA OR MALIGNANCY.   Menarche at age of 74 or 4 First live birth at age of 8 OCP use: previous use > 5 years  History of hysterectomy: yes. She has ovaries.  Menopausal status: postmenopausal History of HRT use: no  History of chest radiation: no Number of previous breast biopsies:  no  Family history positive for multiple family members with prostate, colon, lung liver cancer. No family history of breast cancer    08/10/2024 Cancer Staging   Staging form: Breast, AJCC 8th Edition - Clinical stage from 08/10/2024: Stage Unknown (cTX, cN0, cM0, G1, ER+, PR+, HER2-) - Signed by Babara Call, MD on 08/10/2024 Stage prefix: Initial diagnosis Histologic grading system: 3 grade system     Patient is accompanied by husband. She reports bruising and tenderness at right breast biopsy sites She drinks alcohol and has fatty liver disease  MEDICAL HISTORY:  Past Medical History:  Diagnosis Date    Abnormal uterine bleeding (AUB) 03/20/2019   Acute lower UTI 05/12/2019   Acute respiratory failure with hypoxia (HCC) 04/10/2018   Anemia    Arthritis    lower back and hips   Dysmenorrhea 03/20/2019   Elevated liver enzymes 11/24/2013   ETOH abuse    Fatty liver 10/03/2019   Fibroid    GERD (gastroesophageal reflux disease)    Heavy menstrual period    Hypertension    Menorrhagia 12/13/2017   SIRS (systemic inflammatory response syndrome) (HCC) 05/12/2019    SURGICAL HISTORY: Past Surgical History:  Procedure Laterality Date   BACK SURGERY     BREAST BIOPSY Right 08/07/2024   MM RT BREAST BX W LOC DEV EA AD LESION IMG BX SPEC STEREO GUIDE 08/07/2024 [facility]   BREAST BIOPSY Right 08/07/2024   US  RT BREAST BX W LOC DEV 1ST LESION IMG BX SPEC US  GUIDE 08/07/2024 [facility]  BREAST BIOPSY Right 08/07/2024   MM RT BREAST BX W LOC DEV 1ST LESION IMAGE BX SPEC STEREO GUIDE 08/07/2024 [facility]   COLONOSCOPY N/A 03/20/2024   Procedure: COLONOSCOPY;  Surgeon: Maryruth Ole DASEN, MD;  Location: [facility]COPY;  Service: Endoscopy;  Laterality: N/A;   CYSTOSCOPY N/A 12/19/2019   Procedure: CYSTOSCOPY;  Surgeon: Izell Harari, MD;  Location: [facility] OR;  Service: Gynecology;  Laterality: N/A;   ELBOW SURGERY     1997    ORIF ANKLE FRACTURE Right 03/25/2022   Procedure: OPEN REDUCTION INTERNAL FIXATION (ORIF) ANKLE FRACTURE;  Surgeon: Addie Cordella Hamilton, MD;  Location: [facility] OR;  Service: Orthopedics;  Laterality: Right;   POLYPECTOMY  03/20/2024   Procedure: POLYPECTOMY, INTESTINE;  Surgeon: Maryruth Ole DASEN, MD;  Location: [facility] ENDOSCOPY;  Service: Endoscopy;;   TOTAL LAPAROSCOPIC HYSTERECTOMY WITH SALPINGECTOMY Bilateral 12/19/2019   Procedure: TOTAL LAPAROSCOPIC HYSTERECTOMY WITH BILATERAL SALPINGECTOMY;  Surgeon: Izell Harari, MD;  Location: [facility] OR;  Service: Gynecology;  Laterality: Bilateral;   TUBAL LIGATION      SOCIAL HISTORY: Social History   Socioeconomic  History   Marital status: Married    Spouse name: Francis   Number of children: 2   Years of education: 14   Highest education level: Not on file  Occupational History   Occupation: Homemaker   Tobacco Use   Smoking status: Some Days    Current packs/day: 0.50    Types: Cigarettes   Smokeless tobacco: Never  Vaping Use   Vaping status: Never Used  Substance and Sexual Activity   Alcohol use: Yes    Comment: She drinks vodka and cranberry, approx 6 -8 mini bottles per day   Drug use: No   Sexual activity: Not Currently    Partners: Male    Birth control/protection: Surgical  Other Topics Concern   Not on file  Social History Narrative   Tiki grew up in [place] . She attended [employer] for a certificate in accounting. She currently lives a home with her husband and 2 boys, 1 girl ( step daughter). Her oldest son has autism, 83 years of age.    She a daughter with 3 grandchildren      She enjoys going out with her family.       Son is disabled [employer]).   Husband is an alcoholic   Social Drivers of Corporate Investment Banker Strain: High Risk (03/03/2024)   Received from [facility]   Overall Financial Resource Strain (CARDIA)    Difficulty of Paying Living Expenses: Hard  Food Insecurity: No Food Insecurity (08/10/2024)   Hunger Vital Sign    Worried About Running Out of Food in the Last Year: Never true    Ran Out of Food in the Last Year: Never true  Transportation Needs: No Transportation Needs (08/10/2024)   PRAPARE - Administrator, Civil Service (Medical): No    Lack of Transportation (Non-Medical): No  Physical Activity: Not on file  Stress: Not on file  Social Connections: Not on file  Intimate Partner Violence: Not At Risk (08/10/2024)   Humiliation, Afraid, Rape, and Kick questionnaire    Fear of Current or Ex-Partner: No    Emotionally Abused: No    Physically Abused: No    Sexually Abused: No    FAMILY  HISTORY: Family History  Problem Relation Age of Onset   Cancer Mother 37       Lung    Hyperlipidemia Mother  Hypertension Mother    Stroke Mother    Kidney disease Mother    Diabetes Mother    Heart disease Mother    Depression Mother    Hyperlipidemia Father    Cancer Maternal Aunt    Cancer Maternal Uncle    Cancer Paternal Aunt    Cancer Paternal Uncle    Cancer Maternal Grandmother    Cancer Maternal Grandfather    Cancer Paternal Grandmother    Cancer Paternal Grandfather 35       Colon Cancer - died   Autism Son    Breast cancer Neg Hx     ALLERGIES:  is allergic to amoxicillin , dilaudid  [hydromorphone  hcl], and morphine  and codeine.  MEDICATIONS:  Current Outpatient Medications  Medication Sig Dispense Refill   amLODipine  (NORVASC ) 10 MG tablet Take 1 tablet (10 mg total) by mouth daily. 90 tablet 3   Aspirin -Salicylamide-Caffeine (BC HEADACHE POWDER PO) Take 1 packet by mouth 2 (two) times daily as needed (for pain, headaches, or discomfort).     busPIRone  (BUSPAR ) 5 MG tablet Take 1 tablet (5 mg total) by mouth 3 (three) times daily as needed. 60 tablet 1   DULoxetine  (CYMBALTA ) 30 MG capsule TAKE TWO CAPSULES (60 MG TOTAL) BY MOUTH DAILY. 180 capsule 3   meloxicam  (MOBIC ) 7.5 MG tablet TAKE ONE (1) TO TWO (2) TABLETS (7.5-15 MG TOTAL) BY MOUTH DAILY AS NEEDED FOR PAIN. 60 tablet 2   omeprazole  (PRILOSEC) 20 MG capsule TAKE ONE CAPSULE (20 MG TOTAL) BY MOUTH DAILY. 90 capsule 0   ondansetron  (ZOFRAN ) 4 MG tablet Take 1 tablet (4 mg total) by mouth every 8 (eight) hours as needed for nausea or vomiting. 12 tablet 0   cetirizine  (ZYRTEC ) 10 MG tablet Take 1 tablet (10 mg total) by mouth daily. (Patient not taking: Reported on 08/10/2024) 30 tablet 0   valACYclovir  (VALTREX ) 1000 MG tablet Take two tablets ( total 2000 mg) by mouth q12h x 1 day; Start: ASAP after symptom onset (Patient not taking: Reported on 08/10/2024) 6 tablet 2   No current facility-administered  medications for this visit.    Review of Systems  Constitutional:  Negative for appetite change, chills, fatigue and fever.  HENT:   Negative for hearing loss and voice change.   Eyes:  Negative for eye problems.  Respiratory:  Negative for chest tightness and cough.   Cardiovascular:  Negative for chest pain.  Gastrointestinal:  Negative for abdominal distention, abdominal pain and blood in stool.  Endocrine: Negative for hot flashes.  Genitourinary:  Negative for difficulty urinating and frequency.   Musculoskeletal:  Negative for arthralgias.  Skin:  Negative for itching and rash.  Neurological:  Negative for extremity weakness.  Hematological:  Negative for adenopathy.  Psychiatric/Behavioral:  Negative for confusion.      PHYSICAL EXAMINATION: ECOG PERFORMANCE STATUS: 0 - Asymptomatic  Vitals:   08/10/24 1510  BP: (!) 147/92  Pulse: (!) 105  Resp: 20  Temp: 98.6 F (37 C)  SpO2: 100%   Filed Weights   08/10/24 1510  Weight: 174 lb 12.8 oz (79.3 kg)    Physical Exam Constitutional:      General: She is not in acute distress.    Appearance: She is not diaphoretic.  HENT:     Head: Normocephalic and atraumatic.  Eyes:     General: No scleral icterus. Cardiovascular:     Rate and Rhythm: Normal rate and regular rhythm.  Pulmonary:     Effort: Pulmonary effort  is normal. No respiratory distress.     Breath sounds: No wheezing.  Abdominal:     General: There is no distension.     Palpations: Abdomen is soft.     Tenderness: There is no abdominal tenderness.     Comments: Right upper quadrant fullness.   Musculoskeletal:        General: Normal range of motion.     Cervical back: Normal range of motion and neck supple.  Skin:    General: Skin is warm and dry.     Findings: No erythema.  Neurological:     Mental Status: She is alert and oriented to person, place, and time. Mental status is at baseline.     Motor: No abnormal muscle tone.  Psychiatric:         Mood and Affect: Mood and affect normal.    Breast exam was performed in seated and lying down position. Patient is status post right breast biopsy with extensive bruising and some tenderness limiting breast exam No palpable left breast mass.  No palpable axillary adenopathy bilaterally.   LABORATORY DATA:  I have reviewed the data as listed    Latest Ref Rng & Units 08/10/2024    4:02 PM 12/18/2023   11:11 PM 11/18/2023   12:20 PM  CBC  WBC 4.0 - 10.5 K/uL 12.1  9.7  10.7   Hemoglobin 12.0 - 15.0 g/dL 85.7  85.3  85.6   Hematocrit 36.0 - 46.0 % 41.1  42.1  42.9   Platelets 150 - 400 K/uL 351  448  587.0       Latest Ref Rng & Units 08/10/2024    4:02 PM 02/10/2024    4:21 PM 12/18/2023   11:11 PM  CMP  Glucose 70 - 99 mg/dL 875  889  878   BUN 6 - 20 mg/dL 9  11  <5   Creatinine 0.44 - 1.00 mg/dL 9.38  9.37  9.46   Sodium 135 - 145 mmol/L 139  137  135   Potassium 3.5 - 5.1 mmol/L 3.8  3.7  2.8   Chloride 98 - 111 mmol/L 93  91  93   CO2 22 - 32 mmol/L 33  31  26   Calcium 8.9 - 10.3 mg/dL 89.7  9.8  9.1   Total Protein 6.5 - 8.1 g/dL 8.6  8.1  9.0   Total Bilirubin 0.0 - 1.2 mg/dL 0.6  0.9  0.6   Alkaline Phos 38 - 126 U/L 166  117  89   AST 15 - 41 U/L 81  111  69   ALT 0 - 44 U/L 51  73  38      RADIOGRAPHIC STUDIES: I have personally reviewed the radiological images as listed and agreed with the findings in the report. US  RT BREAST BX W LOC DEV 1ST LESION IMG BX SPEC US  GUIDE Addendum Date: 08/08/2024 ADDENDUM REPORT: 08/08/2024 13:22 ADDENDUM: PATHOLOGY revealed: Site 1. Breast, right, needle core biopsy, 1 o'clock 6 cm (coil clip)- INVASIVE DUCTAL CARCINOMA OVERALL GRADE: 1 LYMPHOVASCULAR INVASION: NOT IDENTIFIED, CANCER LENGTH: 8 MM / 0.8 MM, CALCIFICATIONS: NOT IDENTIFIED. Pathology results are CONCORDANT with imaging findings, per Dr. Dina Arceo. PATHOLOGY revealed: Site 2. Breast, right, needle core biopsy, UIQ anterior (x clip)- BENIGN BREAST TISSUE WITH FIBROCYSTIC  CHANGES INCLUDING STROMAL FIBROSIS, CYSTS, USUAL DUCTAL HYPERPLASIA AND CALCIFICATIONS. Pathology results are CONCORDANT with imaging findings, per Dr. Dina Arceo. PATHOLOGY revealed: Site 3. Breast, right, needle core biopsy,  lateral (ribbon clip)- BENIGN BREAST TISSUE WITH FIBROCYSTIC CHANGES INCLUDING STROMAL FIBROSIS, SCLEROSING ADENOSIS, CYSTS, USUAL DUCTAL HYPERPLASIA AND CALCIFICATIONS. NEGATIVE FOR ATYPIA OR MALIGNANCY. Pathology results are CONCORDANT with imaging findings, per Dr. Dina Arceo. Pathology results and recommendations below were discussed with patient by telephone on 08/08/2024 by Mliss CHARM Molt RN. Patient reported biopsy site within normal limits with slight tenderness at the site. Post biopsy care instructions were reviewed, questions were answered and my direct phone number was provided to patient. Patient was instructed to call [facility] if any concerns or questions arise related to the biopsy. RECOMMENDATIONS: 1. Surgical and oncological consultation. Request for surgical and oncological consultation relayed to Shasta Ada RN at [facility]. 2. Recommend pretreatment bilateral breast MRI with and without contrast to determine extent of breast disease. 3. Stereotactic biopsy of the additional right breast calcifications recommended. Patient will be scheduled for biopsy at her earliest convenience by the schedulers. Pathology results reported by Mliss CHARM Molt RN 08/08/2024. Electronically Signed   By: Dina  Arceo M.D.   On: 08/08/2024 13:22   Result Date: 08/08/2024 CLINICAL DATA:  53 year old female presents for ultrasound-guided core biopsy of a mass in the 1 o'clock region of the right breast 5 cm from the nipple at the posterior extent of suspicious calcifications and stereotactic biopsy of calcifications in the anterior extent of suspicious calcifications and stereotactic biopsy of amorphous calcifications in the lateral aspect of the right breast. EXAM:  RIGHT BREAST ULTRASOUND CORE BIOPSY x 1 AND RIGHT BREAST STEREOTACTIC CORE NEEDLE BIOPSIES x2 COMPARISON:  Previous exam(s). FINDINGS: I met with the patient and we discussed the procedure of ultrasound-guided biopsy, including benefits and alternatives. We discussed the high likelihood of a successful procedure. We discussed the risks of the procedure, including infection, bleeding, tissue injury, clip migration, and inadequate sampling. Informed written consent was given. The usual time-out protocol was performed immediately prior to the procedure. Lesion quadrant: Right breast 1 o'clock 6 cm from the nipple Using sterile technique and 1% lidocaine  and 1% lidocaine  with epinephrine  as local anesthetic, under direct ultrasound visualization, a 14 gauge spring-loaded device was used to perform biopsy of a mass in the 1 o'clock region of the right breast 6 cm from the nipple using a medial to lateral approach. At the conclusion of the procedure coil shaped tissue marker clip was deployed into the biopsy cavity. Follow up 2 view mammogram was performed and dictated separately. The patient and I discussed the procedure of stereotactic-guided biopsy including benefits and alternatives. We discussed the high likelihood of a successful procedure. We discussed the risks of the procedure including infection, bleeding, tissue injury, clip migration, and inadequate sampling. Informed written consent was given. The usual time out protocol was performed immediately prior to the procedure. Using sterile technique and 1% lidocaine  and 1% lidocaine  with epinephrine  as local anesthetic, under stereotactic guidance, a 9 gauge vacuum assisted device was used to perform core needle biopsy of calcifications in the upper-inner quadrant of the right breast (anterior) using a medial to lateral approach. Specimen radiograph was performed showing calcifications are present in the tissue samples. Specimens with calcifications are identified  for pathology. Lesion quadrant: Upper inner quadrant (anterior extent) At the conclusion of the procedure, X shaped tissue marker clip was deployed into the biopsy cavity. Follow-up 2-view mammogram was performed and dictated separately. The patient and I discussed the procedure of stereotactic-guided biopsy including benefits and alternatives. We discussed the high likelihood of a successful  procedure. We discussed the risks of the procedure including infection, bleeding, tissue injury, clip migration, and inadequate sampling. Informed written consent was given. The usual time out protocol was performed immediately prior to the procedure. Using sterile technique and 1% lidocaine  and 1% lidocaine  with epinephrine  as local anesthetic, under stereotactic guidance, a 9 gauge vacuum assisted device was used to perform core needle biopsy of calcifications in the lateral aspect of the right breast using a superior to inferior approach. Specimen radiograph was performed showing calcifications are present in the tissue samples. Specimens with calcifications are identified for pathology. Lesion quadrant: Lateral At the conclusion of the procedure, ribbon shaped tissue marker clip was deployed into the biopsy cavity. Follow-up 2-view mammogram was performed and dictated separately. IMPRESSION: Ultrasound guided biopsy of the right breast x1 and stereotactic-guided biopsies of the right breast x 2. No apparent complications. Electronically Signed: By: Dina  Arceo M.D. On: 08/07/2024 12:26   MM RT BREAST BX W LOC DEV 1ST LESION IMAGE BX SPEC STEREO GUIDE Addendum Date: 08/08/2024 ADDENDUM REPORT: 08/08/2024 13:22 ADDENDUM: PATHOLOGY revealed: Site 1. Breast, right, needle core biopsy, 1 o'clock 6 cm (coil clip)- INVASIVE DUCTAL CARCINOMA OVERALL GRADE: 1 LYMPHOVASCULAR INVASION: NOT IDENTIFIED, CANCER LENGTH: 8 MM / 0.8 MM, CALCIFICATIONS: NOT IDENTIFIED. Pathology results are CONCORDANT with imaging findings, per Dr. Dina  Arceo. PATHOLOGY revealed: Site 2. Breast, right, needle core biopsy, UIQ anterior (x clip)- BENIGN BREAST TISSUE WITH FIBROCYSTIC CHANGES INCLUDING STROMAL FIBROSIS, CYSTS, USUAL DUCTAL HYPERPLASIA AND CALCIFICATIONS. Pathology results are CONCORDANT with imaging findings, per Dr. Dina Arceo. PATHOLOGY revealed: Site 3. Breast, right, needle core biopsy, lateral (ribbon clip)- BENIGN BREAST TISSUE WITH FIBROCYSTIC CHANGES INCLUDING STROMAL FIBROSIS, SCLEROSING ADENOSIS, CYSTS, USUAL DUCTAL HYPERPLASIA AND CALCIFICATIONS. NEGATIVE FOR ATYPIA OR MALIGNANCY. Pathology results are CONCORDANT with imaging findings, per Dr. Dina Arceo. Pathology results and recommendations below were discussed with patient by telephone on 08/08/2024 by Mliss CHARM Molt RN. Patient reported biopsy site within normal limits with slight tenderness at the site. Post biopsy care instructions were reviewed, questions were answered and my direct phone number was provided to patient. Patient was instructed to call [facility] if any concerns or questions arise related to the biopsy. RECOMMENDATIONS: 1. Surgical and oncological consultation. Request for surgical and oncological consultation relayed to Shasta Ada RN at [facility]. 2. Recommend pretreatment bilateral breast MRI with and without contrast to determine extent of breast disease. 3. Stereotactic biopsy of the additional right breast calcifications recommended. Patient will be scheduled for biopsy at her earliest convenience by the schedulers. Pathology results reported by Mliss CHARM Molt RN 08/08/2024. Electronically Signed   By: Dina  Arceo M.D.   On: 08/08/2024 13:22   Result Date: 08/08/2024 CLINICAL DATA:  53 year old female presents for ultrasound-guided core biopsy of a mass in the 1 o'clock region of the right breast 5 cm from the nipple at the posterior extent of suspicious calcifications and stereotactic biopsy of calcifications in the anterior  extent of suspicious calcifications and stereotactic biopsy of amorphous calcifications in the lateral aspect of the right breast. EXAM: RIGHT BREAST ULTRASOUND CORE BIOPSY x 1 AND RIGHT BREAST STEREOTACTIC CORE NEEDLE BIOPSIES x2 COMPARISON:  Previous exam(s). FINDINGS: I met with the patient and we discussed the procedure of ultrasound-guided biopsy, including benefits and alternatives. We discussed the high likelihood of a successful procedure. We discussed the risks of the procedure, including infection, bleeding, tissue injury, clip migration, and inadequate sampling. Informed written consent was given.  The usual time-out protocol was performed immediately prior to the procedure. Lesion quadrant: Right breast 1 o'clock 6 cm from the nipple Using sterile technique and 1% lidocaine  and 1% lidocaine  with epinephrine  as local anesthetic, under direct ultrasound visualization, a 14 gauge spring-loaded device was used to perform biopsy of a mass in the 1 o'clock region of the right breast 6 cm from the nipple using a medial to lateral approach. At the conclusion of the procedure coil shaped tissue marker clip was deployed into the biopsy cavity. Follow up 2 view mammogram was performed and dictated separately. The patient and I discussed the procedure of stereotactic-guided biopsy including benefits and alternatives. We discussed the high likelihood of a successful procedure. We discussed the risks of the procedure including infection, bleeding, tissue injury, clip migration, and inadequate sampling. Informed written consent was given. The usual time out protocol was performed immediately prior to the procedure. Using sterile technique and 1% lidocaine  and 1% lidocaine  with epinephrine  as local anesthetic, under stereotactic guidance, a 9 gauge vacuum assisted device was used to perform core needle biopsy of calcifications in the upper-inner quadrant of the right breast (anterior) using a medial to lateral approach.  Specimen radiograph was performed showing calcifications are present in the tissue samples. Specimens with calcifications are identified for pathology. Lesion quadrant: Upper inner quadrant (anterior extent) At the conclusion of the procedure, X shaped tissue marker clip was deployed into the biopsy cavity. Follow-up 2-view mammogram was performed and dictated separately. The patient and I discussed the procedure of stereotactic-guided biopsy including benefits and alternatives. We discussed the high likelihood of a successful procedure. We discussed the risks of the procedure including infection, bleeding, tissue injury, clip migration, and inadequate sampling. Informed written consent was given. The usual time out protocol was performed immediately prior to the procedure. Using sterile technique and 1% lidocaine  and 1% lidocaine  with epinephrine  as local anesthetic, under stereotactic guidance, a 9 gauge vacuum assisted device was used to perform core needle biopsy of calcifications in the lateral aspect of the right breast using a superior to inferior approach. Specimen radiograph was performed showing calcifications are present in the tissue samples. Specimens with calcifications are identified for pathology. Lesion quadrant: Lateral At the conclusion of the procedure, ribbon shaped tissue marker clip was deployed into the biopsy cavity. Follow-up 2-view mammogram was performed and dictated separately. IMPRESSION: Ultrasound guided biopsy of the right breast x1 and stereotactic-guided biopsies of the right breast x 2. No apparent complications. Electronically Signed: By: Dina  Arceo M.D. On: 08/07/2024 12:26   MM RT BREAST BX W LOC DEV EA AD LESION IMG BX SPEC STEREO GUIDE Addendum Date: 08/08/2024 ADDENDUM REPORT: 08/08/2024 13:22 ADDENDUM: PATHOLOGY revealed: Site 1. Breast, right, needle core biopsy, 1 o'clock 6 cm (coil clip)- INVASIVE DUCTAL CARCINOMA OVERALL GRADE: 1 LYMPHOVASCULAR INVASION: NOT  IDENTIFIED, CANCER LENGTH: 8 MM / 0.8 MM, CALCIFICATIONS: NOT IDENTIFIED. Pathology results are CONCORDANT with imaging findings, per Dr. Dina Arceo. PATHOLOGY revealed: Site 2. Breast, right, needle core biopsy, UIQ anterior (x clip)- BENIGN BREAST TISSUE WITH FIBROCYSTIC CHANGES INCLUDING STROMAL FIBROSIS, CYSTS, USUAL DUCTAL HYPERPLASIA AND CALCIFICATIONS. Pathology results are CONCORDANT with imaging findings, per Dr. Dina Arceo. PATHOLOGY revealed: Site 3. Breast, right, needle core biopsy, lateral (ribbon clip)- BENIGN BREAST TISSUE WITH FIBROCYSTIC CHANGES INCLUDING STROMAL FIBROSIS, SCLEROSING ADENOSIS, CYSTS, USUAL DUCTAL HYPERPLASIA AND CALCIFICATIONS. NEGATIVE FOR ATYPIA OR MALIGNANCY. Pathology results are CONCORDANT with imaging findings, per Dr. Dina Arceo. Pathology results and recommendations below were discussed  with patient by telephone on 08/08/2024 by Mliss CHARM Molt RN. Patient reported biopsy site within normal limits with slight tenderness at the site. Post biopsy care instructions were reviewed, questions were answered and my direct phone number was provided to patient. Patient was instructed to call [facility] if any concerns or questions arise related to the biopsy. RECOMMENDATIONS: 1. Surgical and oncological consultation. Request for surgical and oncological consultation relayed to Shasta Ada RN at [facility]. 2. Recommend pretreatment bilateral breast MRI with and without contrast to determine extent of breast disease. 3. Stereotactic biopsy of the additional right breast calcifications recommended. Patient will be scheduled for biopsy at her earliest convenience by the schedulers. Pathology results reported by Mliss CHARM Molt RN 08/08/2024. Electronically Signed   By: Dina  Arceo M.D.   On: 08/08/2024 13:22   Result Date: 08/08/2024 CLINICAL DATA:  53 year old female presents for ultrasound-guided core biopsy of a mass in the 1 o'clock region of the right  breast 5 cm from the nipple at the posterior extent of suspicious calcifications and stereotactic biopsy of calcifications in the anterior extent of suspicious calcifications and stereotactic biopsy of amorphous calcifications in the lateral aspect of the right breast. EXAM: RIGHT BREAST ULTRASOUND CORE BIOPSY x 1 AND RIGHT BREAST STEREOTACTIC CORE NEEDLE BIOPSIES x2 COMPARISON:  Previous exam(s). FINDINGS: I met with the patient and we discussed the procedure of ultrasound-guided biopsy, including benefits and alternatives. We discussed the high likelihood of a successful procedure. We discussed the risks of the procedure, including infection, bleeding, tissue injury, clip migration, and inadequate sampling. Informed written consent was given. The usual time-out protocol was performed immediately prior to the procedure. Lesion quadrant: Right breast 1 o'clock 6 cm from the nipple Using sterile technique and 1% lidocaine  and 1% lidocaine  with epinephrine  as local anesthetic, under direct ultrasound visualization, a 14 gauge spring-loaded device was used to perform biopsy of a mass in the 1 o'clock region of the right breast 6 cm from the nipple using a medial to lateral approach. At the conclusion of the procedure coil shaped tissue marker clip was deployed into the biopsy cavity. Follow up 2 view mammogram was performed and dictated separately. The patient and I discussed the procedure of stereotactic-guided biopsy including benefits and alternatives. We discussed the high likelihood of a successful procedure. We discussed the risks of the procedure including infection, bleeding, tissue injury, clip migration, and inadequate sampling. Informed written consent was given. The usual time out protocol was performed immediately prior to the procedure. Using sterile technique and 1% lidocaine  and 1% lidocaine  with epinephrine  as local anesthetic, under stereotactic guidance, a 9 gauge vacuum assisted device was used to  perform core needle biopsy of calcifications in the upper-inner quadrant of the right breast (anterior) using a medial to lateral approach. Specimen radiograph was performed showing calcifications are present in the tissue samples. Specimens with calcifications are identified for pathology. Lesion quadrant: Upper inner quadrant (anterior extent) At the conclusion of the procedure, X shaped tissue marker clip was deployed into the biopsy cavity. Follow-up 2-view mammogram was performed and dictated separately. The patient and I discussed the procedure of stereotactic-guided biopsy including benefits and alternatives. We discussed the high likelihood of a successful procedure. We discussed the risks of the procedure including infection, bleeding, tissue injury, clip migration, and inadequate sampling. Informed written consent was given. The usual time out protocol was performed immediately prior to the procedure. Using sterile technique and 1% lidocaine  and 1%  lidocaine  with epinephrine  as local anesthetic, under stereotactic guidance, a 9 gauge vacuum assisted device was used to perform core needle biopsy of calcifications in the lateral aspect of the right breast using a superior to inferior approach. Specimen radiograph was performed showing calcifications are present in the tissue samples. Specimens with calcifications are identified for pathology. Lesion quadrant: Lateral At the conclusion of the procedure, ribbon shaped tissue marker clip was deployed into the biopsy cavity. Follow-up 2-view mammogram was performed and dictated separately. IMPRESSION: Ultrasound guided biopsy of the right breast x1 and stereotactic-guided biopsies of the right breast x 2. No apparent complications. Electronically Signed: By: Dina  Arceo M.D. On: 08/07/2024 12:26   MM CLIP PLACEMENT RIGHT Result Date: 08/07/2024 CLINICAL DATA:  Status post ultrasound-guided core biopsy of the right breast x1 and stereotactic biopsy of the  right breast x2 EXAM: 3D DIAGNOSTIC RIGHT MAMMOGRAM POST ULTRASOUND BIOPSY X1 AND STEREOTACTIC BIOPSY OF THE RIGHT BREAST X2 COMPARISON:  Previous exam(s). ACR Breast Density Category c: The breasts are heterogeneously dense, which may obscure small masses. FINDINGS: 3D Mammographic images were obtained following ultrasound guided biopsy the right breast x1 and stereotactic biopsy x2. The coil shaped biopsy marking clip is in expected location in the upper-inner quadrant of the right breast. The X shaped biopsy marker clip is in appropriate position at the anterior extent of the calcifications in the upper-inner quadrant of the right breast and the ribbon shaped biopsy marker clip is in appropriate position in the lateral aspect of the right breast. IMPRESSION: Appropriate positioning of the coil, X and ribbon shaped biopsy marker clips in the right breast. Final Assessment: Post Procedure Mammograms for Marker Placement Electronically Signed   By: Harrie Deis M.D.   On: 08/07/2024 12:34   MM 3D DIAGNOSTIC MAMMOGRAM UNILATERAL RIGHT BREAST Result Date: 07/31/2024 CLINICAL DATA:  Screening recall for RIGHT breast architectural distortion seen in both views, calcifications seen in both views, a focal asymmetry in the LATERAL breast with associated calcifications seen in both views, and an MLO only asymmetry. EXAM: DIGITAL DIAGNOSTIC UNILATERAL RIGHT MAMMOGRAM WITH TOMOSYNTHESIS AND CAD; ULTRASOUND RIGHT BREAST LIMITED TECHNIQUE: Right digital diagnostic mammography and breast tomosynthesis was performed. The images were evaluated with computer-aided detection. ; Targeted ultrasound examination of the right breast was performed COMPARISON:  Previous exam(s). ACR Breast Density Category c: The breasts are heterogeneously dense, which may obscure small masses. FINDINGS: At the RIGHT breast 1 o'clock position middle to posterior depth there is persistent architectural distortion with associated amorphous  microcalcifications, corresponding with the abnormality seen at the time of screening. In the upper inner RIGHT breast, extending from the RETROAREOLAR region to the area of architectural distortion at 1 o'clock, there are extensive segmental microcalcifications which are largely amorphous in morphology, although a few scattered dystrophic calcifications are noted regionally. These calcifications span approximately 8 x 7 x 6 cm (AP x ML x SI). In the LATERAL RIGHT breast approximate 9 o'clock position there are grouped amorphous calcifications spanning 4 mm, with decreased conspicuity of the associated asymmetry described at the time of screening. A questioned asymmetry in the inferior RIGHT breast seen only on the MLO view on the screening resolves with additional imaging, compatible with superimposition of normal breast tissue. Targeted ultrasound of the RIGHT breast was performed. At the 1 o'clock position 6 cm from the nipple there is a hypoechoic mass measuring 4 x 5 x 4 mm with spiculated margins and associated shadowing. This correlates with the architectural distortion seen  on mammography. The LATERAL RIGHT breast was evaluated in the region of grouped microcalcifications with questionable asymmetry. No sonographic correlate is identified. No RIGHT axillary lymphadenopathy is seen on targeted ultrasound. IMPRESSION: 1. Highly suspicious architectural distortion in the RIGHT breast at 1 o'clock with associated microcalcifications, correlating with a 5 mm spiculated mass on ultrasound. Ultrasound-guided biopsy is recommended. 2. Suspicious segmental microcalcifications involving the upper inner quadrant of the RIGHT breast spanning approximately 8 x 7 x 6 cm. Recommend ultrasound-guided biopsy of amorphous calcifications near the anterior extent at the discretion of the performing radiologist; the architectural distortion and calcifications (recommended for ultrasound-guided biopsy above) provides a reasonable  approximation of the calcifications' posterior extent. 3. Grouped amorphous calcifications spanning 4 mm in the LATERAL RIGHT breast approximate 9 o'clock position. Recommend stereotactic biopsy of these calcifications at the time of the above recommended biopsies. 4.  No RIGHT axillary lymphadenopathy seen on ultrasound. RECOMMENDATION: Ultrasound-guided biopsy of the RIGHT breast x1. Stereotactic guided biopsy of the RIGHT breast x2. I have discussed the findings and recommendations with the patient. The recommended procedure was discussed with the patient and questions were answered. Patient expressed their understanding of the recommendation. Patient will be scheduled for the procedure at her earliest convenience by the schedulers. Ordering provider will be notified. If applicable, a reminder letter will be sent to the patient regarding the next appointment. BI-RADS CATEGORY  5: Highly suggestive of malignancy. Electronically Signed   By: Norleen Croak M.D.   On: 07/31/2024 18:44   US  LIMITED ULTRASOUND INCLUDING AXILLA RIGHT BREAST Result Date: 07/31/2024 CLINICAL DATA:  Screening recall for RIGHT breast architectural distortion seen in both views, calcifications seen in both views, a focal asymmetry in the LATERAL breast with associated calcifications seen in both views, and an MLO only asymmetry. EXAM: DIGITAL DIAGNOSTIC UNILATERAL RIGHT MAMMOGRAM WITH TOMOSYNTHESIS AND CAD; ULTRASOUND RIGHT BREAST LIMITED TECHNIQUE: Right digital diagnostic mammography and breast tomosynthesis was performed. The images were evaluated with computer-aided detection. ; Targeted ultrasound examination of the right breast was performed COMPARISON:  Previous exam(s). ACR Breast Density Category c: The breasts are heterogeneously dense, which may obscure small masses. FINDINGS: At the RIGHT breast 1 o'clock position middle to posterior depth there is persistent architectural distortion with associated amorphous  microcalcifications, corresponding with the abnormality seen at the time of screening. In the upper inner RIGHT breast, extending from the RETROAREOLAR region to the area of architectural distortion at 1 o'clock, there are extensive segmental microcalcifications which are largely amorphous in morphology, although a few scattered dystrophic calcifications are noted regionally. These calcifications span approximately 8 x 7 x 6 cm (AP x ML x SI). In the LATERAL RIGHT breast approximate 9 o'clock position there are grouped amorphous calcifications spanning 4 mm, with decreased conspicuity of the associated asymmetry described at the time of screening. A questioned asymmetry in the inferior RIGHT breast seen only on the MLO view on the screening resolves with additional imaging, compatible with superimposition of normal breast tissue. Targeted ultrasound of the RIGHT breast was performed. At the 1 o'clock position 6 cm from the nipple there is a hypoechoic mass measuring 4 x 5 x 4 mm with spiculated margins and associated shadowing. This correlates with the architectural distortion seen on mammography. The LATERAL RIGHT breast was evaluated in the region of grouped microcalcifications with questionable asymmetry. No sonographic correlate is identified. No RIGHT axillary lymphadenopathy is seen on targeted ultrasound. IMPRESSION: 1. Highly suspicious architectural distortion in the RIGHT breast at 1 o'clock  with associated microcalcifications, correlating with a 5 mm spiculated mass on ultrasound. Ultrasound-guided biopsy is recommended. 2. Suspicious segmental microcalcifications involving the upper inner quadrant of the RIGHT breast spanning approximately 8 x 7 x 6 cm. Recommend ultrasound-guided biopsy of amorphous calcifications near the anterior extent at the discretion of the performing radiologist; the architectural distortion and calcifications (recommended for ultrasound-guided biopsy above) provides a reasonable  approximation of the calcifications' posterior extent. 3. Grouped amorphous calcifications spanning 4 mm in the LATERAL RIGHT breast approximate 9 o'clock position. Recommend stereotactic biopsy of these calcifications at the time of the above recommended biopsies. 4.  No RIGHT axillary lymphadenopathy seen on ultrasound. RECOMMENDATION: Ultrasound-guided biopsy of the RIGHT breast x1. Stereotactic guided biopsy of the RIGHT breast x2. I have discussed the findings and recommendations with the patient. The recommended procedure was discussed with the patient and questions were answered. Patient expressed their understanding of the recommendation. Patient will be scheduled for the procedure at her earliest convenience by the schedulers. Ordering provider will be notified. If applicable, a reminder letter will be sent to the patient regarding the next appointment. BI-RADS CATEGORY  5: Highly suggestive of malignancy. Electronically Signed   By: Norleen Croak M.D.   On: 07/31/2024 18:44   MM 3D SCREENING MAMMOGRAM BILATERAL BREAST Result Date: 07/25/2024 CLINICAL DATA:  Screening. EXAM: DIGITAL SCREENING BILATERAL MAMMOGRAM WITH TOMOSYNTHESIS AND CAD TECHNIQUE: Bilateral screening digital craniocaudal and mediolateral oblique mammograms were obtained. Bilateral screening digital breast tomosynthesis was performed. The images were evaluated with computer-aided detection. COMPARISON:  Remote prior exam from 2016 ACR Breast Density Category c: The breasts are heterogeneously dense, which may obscure small masses. FINDINGS: In the right breast, possible distortion, separate asymmetries and calcifications warrant further evaluation. In the left breast, no findings suspicious for malignancy. IMPRESSION: Further evaluation is suggested for possible distortion, separate asymmetries and calcifications in the right breast. RECOMMENDATION: Diagnostic mammogram and possibly ultrasound of the right breast. (Code:[id number]) The  patient will be contacted regarding the findings, and additional imaging will be scheduled. BI-RADS CATEGORY  0: Incomplete: Need additional imaging evaluation. Electronically Signed   By: Corean Salter M.D.   On: 07/25/2024 14:17   DG Bone Density Result Date: 07/19/2024 EXAM: DUAL X-RAY ABSORPTIOMETRY (DXA) FOR BONE MINERAL DENSITY 07/19/2024 3:00 pm CLINICAL DATA:  53 year old Female Postmenopausal. History of multiple fracture (4-5) History of fragility fracture. Patient is or has been on glucocorticoid therapy. TECHNIQUE: An axial (e.g., hips, spine) and/or appendicular (e.g., radius) exam was performed, as appropriate, using [employer] Secretary/administrator at [facility]. Images are obtained for bone mineral density measurement and are not obtained for diagnostic purposes. [id number] Exclusions: L3 due to degenerative change; L4 due to surgical hardware. COMPARISON:  None. FINDINGS: Scan quality: Good. LUMBAR SPINE (L1-L2): BMD (in g/cm2): 1.015 T-score: -1.3 Z-score: -0.7 LEFT FEMORAL NECK: BMD (in g/cm2): 0.942 T-score: -0.7 Z-score: 0.2 LEFT TOTAL HIP: BMD (in g/cm2): 0.936 T-score: -0.6 Z-score: 0.0 RIGHT FEMORAL NECK: BMD (in g/cm2): 0.944 T-score: -0.7 Z-score: 0.2 RIGHT TOTAL HIP: BMD (in g/cm2): 0.944 T-score: -0.5 Z-score: 0.0 FRAX 10-YEAR PROBABILITY OF FRACTURE: 10-year fracture risk is performed using the [employer] FRAX calculator based on patient-reported risk factors. Major osteoporotic fracture: 12.7% Hip fracture: 0.6% Other situations known to alter the reliability of the FRAX score should be considered when making treatment decisions, including chronic glucocorticoid use and past treatments. Further guidance on treatment can be found at the [facility] Foundation's website [email or web address]/.  IMPRESSION: Osteopenia based on BMD. Fracture risk is increased. Increased risk is based on low BMD and history of fragility fracture. RECOMMENDATIONS: 1. All  patients should optimize calcium and vitamin D  intake. 2. Consider FDA-approved medical therapies in postmenopausal women and men aged 61 years and older, based on the following: - A hip or vertebral (clinical or morphometric) fracture - T-score less than or equal to -2.5 and secondary causes have been excluded. - Low bone mass (T-score between -1.0 and -2.5) and a 10-year probability of a hip fracture greater than or equal to 3% or a 10-year probability of a major osteoporosis-related fracture greater than or equal to 20% based on the [place] -adapted WHO algorithm. - Clinician judgment and/or patient preferences may indicate treatment for people with 10-year fracture probabilities above or below these levels 3. Patients with diagnosis of osteoporosis or at high risk for fracture should have regular bone mineral density tests. For patients eligible for Medicare, routine testing is allowed once every 2 years. The testing frequency can be increased to one year for patients who have rapidly progressing disease, those who are receiving or discontinuing medical therapy to restore bone mass, or have additional risk factors. Electronically Signed   By: Dina  Arceo M.D.   On: 07/19/2024 16:03

## 2024-08-10 NOTE — Progress Notes (Signed)
 Accompanied patient and family to initial medical oncology appointment.   Reviewed Breast Cancer treatment handbook.   Care plan summary given to patient.   Reviewed outreach programs and cancer center services.

## 2024-08-10 NOTE — Assessment & Plan Note (Signed)
 Recommend alcohol cessation.

## 2024-08-10 NOTE — Assessment & Plan Note (Signed)
 Recommend genetic testing.

## 2024-08-10 NOTE — Assessment & Plan Note (Signed)
 Possibly due to smoking.  Monitor, can consider work up in the future

## 2024-08-10 NOTE — Assessment & Plan Note (Signed)
 Chronic, due to alcohol use and fatty liver.  Avoid alcohol cessation

## 2024-08-10 NOTE — Assessment & Plan Note (Signed)
 Pathology and radiology counseling: Discussed with the patient, the details of pathology including the type of breast cancer,the clinical staging, the significance of ER, PR and HER-2/neu receptors and the implications for treatment. After reviewing the pathology in detail, we proceeded to discuss the different treatment options between surgery, radiation, chemotherapy, antiestrogen therapies. 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  Bilateral MRI breasts w wo contrast. Stereotactic biopsy of right breast calcifications  2   establish care with surgery for resection 3   Possible Oncotype DX testing on the surgical specimen to determine if she would benefit from adjuvant chemo 4.  Adjuvant radiation 5.  Adjuvant antiestrogen therapy  Return to clinic based upon surgery pathology and Oncotype DX test result

## 2024-08-12 LAB — CANCER ANTIGEN 27.29: CA 27.29: 19.5 U/mL (ref 0.0–38.6)

## 2024-08-12 LAB — CANCER ANTIGEN 15-3: CA 15-3: 13.2 U/mL (ref 0.0–25.0)

## 2024-08-14 ENCOUNTER — Ambulatory Visit: Admission: RE | Admit: 2024-08-14 | Source: Ambulatory Visit

## 2024-08-15 ENCOUNTER — Inpatient Hospital Stay

## 2024-08-15 ENCOUNTER — Inpatient Hospital Stay: Admitting: Licensed Clinical Social Worker

## 2024-08-15 DIAGNOSIS — Z8042 Family history of malignant neoplasm of prostate: Secondary | ICD-10-CM | POA: Diagnosis not present

## 2024-08-15 DIAGNOSIS — C50911 Malignant neoplasm of unspecified site of right female breast: Secondary | ICD-10-CM

## 2024-08-15 DIAGNOSIS — Z8041 Family history of malignant neoplasm of ovary: Secondary | ICD-10-CM | POA: Diagnosis not present

## 2024-08-15 DIAGNOSIS — Z800 Family history of malignant neoplasm of digestive organs: Secondary | ICD-10-CM

## 2024-08-15 DIAGNOSIS — Z801 Family history of malignant neoplasm of trachea, bronchus and lung: Secondary | ICD-10-CM | POA: Diagnosis not present

## 2024-08-15 NOTE — Progress Notes (Unsigned)
 REFERRING PROVIDER: Babara Call, MD [address] [place],  [place] [address]  PRIMARY PROVIDER:  Dineen Rollene MATSU, FNP  PRIMARY REASON FOR VISIT:  1. Invasive ductal carcinoma of breast, female, right (HCC)   2. Family history of colon cancer      HISTORY OF PRESENT ILLNESS:   Judith Drake, a 53 y.o. female, was seen for a [place] cancer genetics consultation at the request of Dr. Babara due to a personal and family history of breast cancer.  Judith Drake presents to clinic today to discuss the possibility of a hereditary predisposition to cancer, genetic testing, and to further clarify her future cancer risks, as well as potential cancer risks for family members.   CANCER HISTORY:  Oncology History  Invasive ductal carcinoma of breast, female, right (HCC)  07/25/2024 Mammogram   B/L screening mammogram showed Further evaluation is suggested for possible distortion, separate asymmetries and calcifications in the right breast   07/31/2024 Mammogram   Right breast diagostic mammogram 1. Highly suspicious architectural distortion in the RIGHT breast at 1 o'clock with associated microcalcifications, correlating with a 5 mm spiculated mass on ultrasound. Ultrasound-guided biopsy is recommended.   2. Suspicious segmental microcalcifications involving the upper inner quadrant of the RIGHT breast spanning approximately 8 x 7 x 6 cm. Recommend ultrasound-guided biopsy of amorphous calcifications near the anterior extent at the discretion of the performing radiologist; the architectural distortion and calcifications (recommended for ultrasound-guided biopsy above) provides a reasonable approximation of the calcifications' posterior extent.   3. Grouped amorphous calcifications spanning 4 mm in the LATERAL RIGHT breast approximate 9 o'clock position. Recommend stereotactic biopsy of these calcifications at the time of the above recommended biopsies.   4.  No RIGHT axillary  lymphadenopathy seen on ultrasound.   08/07/2024 Initial Diagnosis   Invasive ductal carcinoma of breast, female, right [facility])  Patient underwent right breast biopsy  1. Breast, right, needle core biopsy, 1 o'clock 6cm (coil clip) :      INVASIVE DUCTAL CARCINOMA      TUBULE FORMATION: SCORE 1      NUCLEAR PLEOMORPHISM: SCORE 1      MITOTIC COUNT: SCORE 1      TOTAL SCORE: 3      OVERALL GRADE: 1      LYMPHOVASCULAR INVASION: NOT IDENTIFIED      CANCER LENGTH: 8 MM / 0.8 MM      CALCIFICATIONS: NOT IDENTIFIED      OTHER FINDINGS: NONE      ER 100%, PR 95% HER2 (1+)       2. Breast, right, needle core biopsy, UIQ anterior (x clip) :      BENIGN BREAST TISSUE WITH FIBROCYSTIC CHANGES INCLUDING STROMAL FIBROSIS, CYSTS,      USUAL DUCTAL HYPERPLASIA AND CALCIFICATIONS.      NEGATIVE FOR ATYPIA OR MALIGNANCY.       3. Breast, right, needle core biopsy, lateral (ribbon clip) :      BENIGN BREAST TISSUE WITH FIBROCYSTIC CHANGES INCLUDING STROMAL FIBROSIS,      SCLEROSING ADENOSIS, CYSTS, USUAL DUCTAL HYPERPLASIA AND CALCIFICATIONS.      NEGATIVE FOR ATYPIA OR MALIGNANCY.   Menarche at age of 62 or 51 First live birth at age of 51 OCP use: previous use > 5 years  History of hysterectomy: yes. She has ovaries.  Menopausal status: postmenopausal History of HRT use: no  History of chest radiation: no Number of previous breast biopsies:  no  Family history positive for multiple  family members with prostate, colon, lung liver cancer. No family history of breast cancer    08/10/2024 Cancer Staging   Staging form: Breast, AJCC 8th Edition - Clinical stage from 08/10/2024: Stage Unknown (cTX, cN0, cM0, G1, ER+, PR+, HER2-) - Signed by Babara Call, MD on 08/10/2024 Stage prefix: Initial diagnosis Histologic grading system: 3 grade system     RELEVANT MEDICAL HISTORY:  Menarche was at age 46-13.  First live birth at age 63.  Ovaries intact: yes.  Hysterectomy: yes.  Menopausal status:  postmenopausal.  HRT use: 0 years. Colonoscopy: yes; 2025, 1 polyp.  Past Medical History:  Diagnosis Date   Abnormal uterine bleeding (AUB) 03/20/2019   Acute lower UTI 05/12/2019   Acute respiratory failure with hypoxia (HCC) 04/10/2018   Anemia    Arthritis    lower back and hips   Dysmenorrhea 03/20/2019   Elevated liver enzymes 11/24/2013   ETOH abuse    Fatty liver 10/03/2019   Fibroid    GERD (gastroesophageal reflux disease)    Heavy menstrual period    Hypertension    Menorrhagia 12/13/2017   SIRS (systemic inflammatory response syndrome) (HCC) 05/12/2019    Past Surgical History:  Procedure Laterality Date   BACK SURGERY     BREAST BIOPSY Right 08/07/2024   MM RT BREAST BX W LOC DEV EA AD LESION IMG BX SPEC STEREO GUIDE 08/07/2024 [facility]   BREAST BIOPSY Right 08/07/2024   US  RT BREAST BX W LOC DEV 1ST LESION IMG BX SPEC US  GUIDE 08/07/2024 [facility]   BREAST BIOPSY Right 08/07/2024   MM RT BREAST BX W LOC DEV 1ST LESION IMAGE BX SPEC STEREO GUIDE 08/07/2024 [facility]   COLONOSCOPY N/A 03/20/2024   Procedure: COLONOSCOPY;  Surgeon: Maryruth Ole DASEN, MD;  Location: [facility];  Service: Endoscopy;  Laterality: N/A;   CYSTOSCOPY N/A 12/19/2019   Procedure: CYSTOSCOPY;  Surgeon: Izell Harari, MD;  [facility];  Service: Gynecology;  Laterality: N/A;   ELBOW SURGERY     1997    ORIF ANKLE FRACTURE Right 03/25/2022   Procedure: OPEN REDUCTION INTERNAL FIXATION (ORIF) ANKLE FRACTURE;  Surgeon: Addie Cordella Hamilton, MD;  Location: [facility] OR;  Service: Orthopedics;  Laterality: Right;   POLYPECTOMY  03/20/2024   Procedure: POLYPECTOMY, INTESTINE;  Surgeon: Maryruth Ole DASEN, MD;  Location: [facility] END[facility]COPY;  Service: Endoscopy;;   TOTAL LAPAROSCOPIC HYSTERECTOMY WITH SALPINGECTOMY Bilateral 12/19/2019   Procedure: TOTAL LAPAROSCOPIC HYSTERECTOMY WITH BILATERAL SALPINGECTOMY;  Surgeon: Izell Harari, MD;  Location: [facility] OR;  Service: Gynecology;   Laterality: Bilateral;   TUBAL LIGATION      FAMILY HISTORY:  We obtained a detailed, 4-generation family history.  Significant diagnoses are listed below: Family History  Problem Relation Age of Onset   Cancer Mother 77       Lung    Hyperlipidemia Mother    Hypertension Mother    Stroke Mother    Kidney disease Mother    Diabetes Mother    Heart disease Mother    Depression Mother    Hyperlipidemia Father    Cancer Maternal Aunt    Cancer Maternal Uncle    Cancer Paternal Aunt    Cancer Paternal Uncle    Cancer Maternal Grandmother    Cancer Maternal Grandfather    Cancer Paternal Grandmother    Cancer Paternal Grandfather 54       Colon Cancer - died   Autism Son    Breast cancer Neg Hx     Judith Drake  is unaware of previous family history of genetic testing for hereditary cancer risks. There is no reported Ashkenazi Jewish ancestry. There is no known consanguinity.  GENETIC COUNSELING ASSESSMENT: Judith Drake is a 53 y.o. female with a personal and family history of breast cancer which is somewhat suggestive of a hereditary cancer syndrome and predisposition to cancer. We, therefore, discussed and recommended the following at today's visit.   DISCUSSION:We discussed that approximately 10% of breast cancer is hereditary. Most cases of hereditary breast cancer are associated with BRCA1/BRCA2 genes, although there are other genes associated with hereditary cancer as well. Cancers and risks are gene specific. We discussed that testing is beneficial for several reasons including knowing about cancer risks, identifying potential screening and risk-reduction options that may be appropriate, and to understand if other family members could be at risk for cancer and allow them to undergo genetic testing.   We reviewed the characteristics, features and inheritance patterns of hereditary cancer syndromes. We also discussed genetic testing, including the appropriate family members to test,  the process of testing, insurance coverage and turn-around-time for results. We discussed the implications of a negative, positive and/or variant of uncertain significant result. We recommended Judith Drake pursue genetic testing for the [facility] BRCAPlus+CancerNext-Expanded+RNA gene panel.  The [employer] Panel includes sequencing, rearrangement analysis, and RNA analysis for the following 13 genes: ATM, BARD1, BRCA1, BRCA2, CDH1, CHEK2, NF1, PALB2, PTEN, RAD51C, RAD51D, STK11 and TP53 (sequencing and deletion/duplication).  The CancerNext-Expanded gene panel offered by [facility] and includes sequencing, rearrangement, and RNA analysis for the following 77 genes: AIP, ALK, APC, ATM, AXIN2, BAP1, BARD1, BMPR1A, BRCA1, BRCA2, BRIP1, CDC73, CDH1, CDK4, CDKN1B, CDKN2A, CEBPA, CHEK2, CTNNA1, DDX41, DICER1, ETV6, FH, FLCN, GATA2, LZTR1, MAX, MBD4, MEN1, MET, MLH1, MSH2, MSH3, MSH6, MUTYH, NF1, NF2, NTHL1, PALB2, PHOX2B, PMS2, POT1, PRKAR1A, PTCH1, PTEN, RAD51C, RAD51D, RB1, RET, RPS20, RUNX1, SDHA, SDHAF2, SDHB, SDHC, SDHD, SMAD4, SMARCA4, SMARCB1, SMARCE1, STK11, SUFU, TMEM127, TP53, TSC1, TSC2, VHL, and WT1 (sequencing and deletion/duplication); EGFR, HOXB13, KIT, MITF, PDGFRA, POLD1, and POLE (sequencing only); EPCAM and GREM1 (deletion/duplication only).    Based on Judith Drake's personal and family history of cancer, she meets medical criteria for genetic testing. Despite that she meets criteria, she may still have an out of pocket cost.   PLAN: After considering the risks, benefits, and limitations, Judith Drake provided informed consent to pursue genetic testing and the blood sample was sent to [facility] for analysis of the BRCAPlus+CancerNext-Expanded+RNA. Results should be available within approximately 1 weeks' time, at which point they will be disclosed by telephone to Judith Drake, as will any additional recommendations warranted by these results. Judith Drake will receive a summary of her  genetic counseling visit and a copy of her results once available. This information will also be available in Epic.   Judith Drake questions were answered to her satisfaction today. Our contact information was provided should additional questions or concerns arise. Thank you for the referral and allowing us  to share in the care of your patient.   Dena Cary, MS, [facility] Genetic Counselor [email or web address] Phone: [phone]  I personally spent a total of *** minutes in the care of the patient today including counseling and educating, placing orders, and documenting clinical information in the EHR. Dr. Delinda was available for discussion regarding this case.   _______________________________________________________________________ For Office Staff:  Number of people involved in session: 1 Was an Intern/ student involved with case: no

## 2024-08-16 ENCOUNTER — Encounter: Payer: Self-pay | Admitting: Licensed Clinical Social Worker

## 2024-08-16 ENCOUNTER — Other Ambulatory Visit: Payer: Self-pay | Admitting: Family

## 2024-08-16 ENCOUNTER — Inpatient Hospital Stay: Admitting: Occupational Therapy

## 2024-08-16 ENCOUNTER — Encounter: Payer: Self-pay | Admitting: Occupational Therapy

## 2024-08-16 ENCOUNTER — Telehealth: Payer: Self-pay | Admitting: Licensed Clinical Social Worker

## 2024-08-16 DIAGNOSIS — Z1379 Encounter for other screening for genetic and chromosomal anomalies: Secondary | ICD-10-CM | POA: Insufficient documentation

## 2024-08-16 DIAGNOSIS — C50211 Malignant neoplasm of upper-inner quadrant of right female breast: Secondary | ICD-10-CM | POA: Diagnosis not present

## 2024-08-16 DIAGNOSIS — C50911 Malignant neoplasm of unspecified site of right female breast: Secondary | ICD-10-CM

## 2024-08-16 DIAGNOSIS — K219 Gastro-esophageal reflux disease without esophagitis: Secondary | ICD-10-CM

## 2024-08-16 NOTE — Telephone Encounter (Signed)
 I contacted Ms. Zeleznik to discuss her genetic testing results. No pathogenic variants were identified in the 13 genes analyzed on the [facility] BRCAPlus STAT Panel.  Remainder of testing still pending, will call patient with updates. Detailed clinic note to follow.   The test report has been scanned into EPIC and is located under the Molecular Pathology section of the Results Review tab.  A portion of the result report is included below for reference.      Dena Cary, MS, [facility] Genetic Counselor [email or web address] Phone: [phone]

## 2024-08-16 NOTE — Therapy (Signed)
 OUTPATIENT OCCUPATIONAL THERAPY BREAST CANCER BASELINE EVALUATION   Patient Name: Judith Drake MRN: [id number] DOB:[birth year], 53 y.o., female Today's Date: 08/16/2024  END OF SESSION:  OT End of Session - 08/16/24 1647     Visit Number 1    Number of Visits 4    Date for Recertification  11/08/24    OT Start Time 1501    OT Stop Time 1530    OT Time Calculation (min) 29 min    Activity Tolerance Patient tolerated treatment well    Behavior During Therapy [facility] for tasks assessed/performed          Past Medical History:  Diagnosis Date   Abnormal uterine bleeding (AUB) 03/20/2019   Acute lower UTI 05/12/2019   Acute respiratory failure with hypoxia (HCC) 04/10/2018   Anemia    Arthritis    lower back and hips   Dysmenorrhea 03/20/2019   Elevated liver enzymes 11/24/2013   ETOH abuse    Fatty liver 10/03/2019   Fibroid    GERD (gastroesophageal reflux disease)    Heavy menstrual period    Hypertension    Menorrhagia 12/13/2017   SIRS (systemic inflammatory response syndrome) (HCC) 05/12/2019   Past Surgical History:  Procedure Laterality Date   BACK SURGERY     BREAST BIOPSY Right 08/07/2024   MM RT BREAST BX W LOC DEV EA AD LESION IMG BX SPEC STEREO GUIDE 08/07/2024 [facility]   BREAST BIOPSY Right 08/07/2024   US  RT BREAST BX W LOC DEV 1ST LESION IMG BX SPEC US  GUIDE 08/07/2024 [facility]   BREAST BIOPSY Right 08/07/2024   MM RT BREAST BX W LOC DEV 1ST LESION IMAGE BX SPEC STEREO GUIDE 08/07/2024 [facility]   COLONOSCOPY N/A 03/20/2024   Procedure: COLONOSCOPY;  Surgeon: Maryruth Ole DASEN, MD;  Location: [facility];  Service: Endoscopy;  Laterality: N/A;   CYSTOSCOPY N/A 12/19/2019   Procedure: CYSTOSCOPY;  Surgeon: Izell Harari, MD;  [facility];  Service: Gynecology;  Laterality: N/A;   ELBOW SURGERY     1997    ORIF ANKLE FRACTURE Right 03/25/2022   Procedure: OPEN REDUCTION INTERNAL FIXATION (ORIF) ANKLE FRACTURE;  Surgeon: Addie Cordella Hamilton, MD;  Location: [facility] OR;  Service: Orthopedics;  Laterality: Right;   POLYPECTOMY  03/20/2024   Procedure: POLYPECTOMY, INTESTINE;  Surgeon: Maryruth Ole DASEN, MD;  Location: [facility];  Service: Endoscopy;;   TOTAL LAPAROSCOPIC HYSTERECTOMY WITH SALPINGECTOMY Bilateral 12/19/2019   Procedure: TOTAL LAPAROSCOPIC HYSTERECTOMY WITH BILATERAL SALPINGECTOMY;  Surgeon: Izell Harari, MD;  Location: [facility] OR;  Service: Gynecology;  Laterality: Bilateral;   TUBAL LIGATION     Patient Active Problem List   Diagnosis Date Noted   Genetic testing 08/16/2024   Invasive ductal carcinoma of breast, female, right (HCC) 08/10/2024   Family history of cancer 08/10/2024   Leukocytosis 08/10/2024   Oral lesion 04/20/2024   Hepatic steatosis 11/18/2023   Olecranon bursitis of left elbow 11/08/2023   Sepsis (HCC) 11/05/2023   Polyarthritis 11/05/2023   Polyarthralgia 11/05/2023   Fever 11/05/2023   Joint swelling 11/04/2023   Syphilis, unspecified 11/04/2023   Streptococcal pharyngitis 10/30/2023   SIRS (systemic inflammatory response syndrome) (HCC) 10/28/2023   DM (diabetes mellitus) (HCC) 08/25/2023   IDA (iron deficiency anemia) 08/17/2023   Vision changes 08/17/2023   Hoarseness of voice 08/10/2023   Muscle cramps at night 08/10/2023   Oral thrush 08/10/2023   Closed right ankle fracture 03/24/2022   Vitamin D  deficiency 01/21/2022   HLD (hyperlipidemia) 01/21/2022  TIA (transient ischemic attack) 01/16/2022   OAB (overactive bladder) 01/14/2022   Hand pain 07/19/2020   Dysplasia of cervix, 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   Status post hysterectomy 12/19/2019   Elevated transaminase level 12/18/2019   Hypomagnesemia    Nonallopathic lesion of sacral region 03/28/2018   Nonallopathic lesion of thoracic region 03/28/2018   Nonallopathic lesion of lumbosacral region 03/28/2018   Nonallopathic lesion of pelvic region 03/28/2018   Nonallopathic lesion of cervical region  03/28/2018   Enlarged thyroid  03/07/2018   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   Lumbar radiculopathy, right 02/08/2018   Anxiety and depression 01/03/2018   Rash 01/03/2018   Right hip pain 01/03/2018   GERD (gastroesophageal reflux disease) 12/13/2017   Hypokalemia 12/13/2017   Depression 11/24/2013   Fatigue 07/12/2013   Essential hypertension 07/12/2013   Alcohol use 07/12/2013   Tobacco abuse 07/12/2013   Yeast infection of the skin 07/12/2013   Routine general medical examination at a health care facility 07/12/2013    PCP: Dineen NP  REFERRING PROVIDER: Dr Babara MART DIAG: R breast Cancer  THERAPY DIAG:  Invasive ductal carcinoma of breast, female, right [facility])  Rationale for Evaluation and Treatment: Rehabilitation  ONSET DATE: 07/31/24  SUBJECTIVE:                                                                                                                                                                                           SUBJECTIVE STATEMENT: Patient reports she is here today after being refer by one of her medical team for her newly diagnosed right breast cancer.   PERTINENT HISTORY:  Patient was diagnosed with right  breast cancer -meeting with surgeon /Dr [name] 08/22/24 to decide surgery   PATIENT GOALS:   reduce lymphedema risk and learn post op HEP.   PAIN:  Are you having pain? No  PRECAUTIONS: Active CA, R lymphedema post surgery    HAND DOMINANCE: right  WEIGHT BEARING RESTRICTIONS: No  FALLS:  Has patient fallen in last 6 months? ?  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  Needs assistance with all ADLs.  But can transfer self.  Likes to be on the phone and do her own house work and cooking   OBJECTIVE:  COGNITION: Overall cognitive status: Within functional limits for tasks assessed    POSTURE:  rounded shoulders  posture  UPPER EXTREMITY AROM/PROM:   Bilateral shoulder flexion abduction external rotation within normal limits.  Patient report about more than 20 years ago had left elbow fracture.  As well as a few months ago had a left wrist fracture with ORIF.  Still some soreness.  But range of motion appear within functional limits. CERVICAL AROM: All within normal limits:   UPPER EXTREMITY STRENGTH: 5/5 for shoulder flexion abduction external rotation  LYMPHEDEMA ASSESSMENTS:   LANDMARK RIGHT   eval  10 cm proximal to olecranon process   Olecranon process   10 cm proximal to ulnar styloid process   Just proximal to ulnar styloid process   Across hand at thumb web space   At base of 2nd digit   (Blank rows = not tested)  LANDMARK LEFT   eval  10 cm proximal to olecranon process   Olecranon process   10 cm proximal to ulnar styloid process   Just proximal to ulnar styloid process   Across hand at thumb web space   At base of 2nd digit   (Blank rows = not tested)  L-DEX LYMPHEDEMA SCREENING:  The patient was assessed using the L-Dex machine today to produce a lymphedema index baseline score. The patient will be reassessed on a regular basis (typically every 3 months) to obtain new L-Dex scores. If the score is > 6.5 points away from his/her baseline score indicating onset of subclinical lymphedema, it will be recommended to wear a compression garment for 4 weeks, 12 hours per day and then be reassessed. If the score continues to be > 6.5 points from baseline at reassessment, we will initiate lymphedema treatment. Assessing in this manner has a 95% rate of preventing clinically significant lymphedema.   L-DEX FLOWSHEETS - 08/16/24 1600       L-DEX LYMPHEDEMA SCREENING   Measurement Type Unilateral    L-DEX MEASUREMENT EXTREMITY Upper Extremity    POSITION  Standing    DOMINANT SIDE Right    At Risk Side Right    BASELINE SCORE (UNILATERAL) -0.8            PATIENT EDUCATION:   Education details: Lymphedema risk reduction-will review more after surgery and post op shoulder/posture HEP Person educated: Patient Education method: Explanation, Demonstration, Handout Education comprehension: Patient verbalized understanding and returned demonstration  HOME EXERCISE PROGRAM: Reviewed with patient active assisted range of motion in supine using wand for shoulder flexion and abduction to about 90 degrees depending on mastectomy or lumpectomy.  And then gradually going overhead pain-free.  Home exercises to be done 3 times a day pain less than 2/10 and with gravity.  External rotation to be done unilateral right upper extremity with gravity in supine with a slight pull 3 times a day 10 reps and then can increase to bilateral.   ASSESSMENT:  CLINICAL IMPRESSION: Patient presented with a diagnosis of right breast cancer.  Patient is meeting with Dr. [name] on 08/22/2024 to decide on lumpectomy or mastectomy.  Patient was educated on send signs and symptoms of lymphedema as well as prevention and precautions.  Handout provided but will be reviewed further after surgery.  Patient was educated on home program for active assisted range of motion after surgery.  Patient verbalized understanding and handout provided.  Baseline L-Dex score was done today.  She will benefit from a post op OT reassessment to determine needs and from L-Dex screens every 3 months for 2 years to detect subclinical lymphedema.  Pt will benefit from skilled therapeutic intervention to improve on the following deficits: Decreased knowledge of precautions and lymphedema education, impaired UE functional use, pain, decreased ROM, postural dysfunction.   OT treatment/interventions: ADL/self-care  home management, pt/family education, therapeutic exercise,manual therapy  REHAB POTENTIAL: Good  CLINICAL DECISION MAKING: Stable/uncomplicated  EVALUATION COMPLEXITY: Low   GOALS: Goals reviewed with patient?  YES  LONG TERM GOALS: (STG=LTG)    Name Target Date Goal status  1 Pt will be able to verbalize understanding of pertinent lymphedema risk reduction practices relevant to her dx specifically related to skin care.  Baseline:  No knowledge 12 weeks New  2 Pt will be able to return demo and/or verbalize understanding of the post op HEP related to regaining shoulder ROM. Baseline:  No knowledge Today Achieved at eval        4 Pt will demo she has regained full shoulder ROM and function post operatively compared to baselines.  Baseline: See objective measurements taken today. 12 weeks New    PLAN:  OT FREQUENCY/DURATION: EVAL and 3 follow-ups if needed.  12 wks   PLAN FOR NEXT SESSION: will reassess 3 weeks post op to determine needs.   Occupational Therapy Information for After Breast Cancer Surgery/Treatment:  Lymphedema is a swelling condition that you may be at risk for in your arm if you have lymph nodes removed from the armpit area.  After a sentinel node biopsy, the risk is approximately 5-9% and is higher after an axillary node dissection.  There is treatment available for this condition and it is not life-threatening.  Contact your physician or occupational therapist with concerns. You may begin the 4 shoulder/posture exercises (see additional sheet) when permitted by your physician (typically a week after surgery).  If you have drains, you may need to wait until those are removed before beginning range of motion exercises.  A general recommendation is to not lift your arms above shoulder height until drains are removed.  These exercises should be done to your tolerance and gently.  This is not a no pain/no gain type of recovery so listen to your body and stretch into the range of motion that you can tolerate, stopping if you have pain.  If you are having immediate reconstruction, ask your plastic surgeon about doing exercises as he or she may want you to wait. .  While undergoing any  medical procedure or treatment, try to avoid blood pressure being taken or needle sticks from occurring on the arm on the side of cancer.   This recommendation begins after surgery and continues for the rest of your life.  This may help reduce your risk of getting lymphedema (swelling in your arm). An excellent resource for those seeking information on lymphedema is the National Lymphedema Network's web site. It can be accessed at www.lymphnet.org If you notice swelling in your hand, arm or breast at any time following surgery (even if it is many years from now), please contact your doctor or occupational therapist to discuss this.  Lymphedema can be treated at any time but it is easier for you if it is treated early on.  If you feel like your shoulder motion is not returning to normal in a reasonable amount of time, please contact your surgeon or occupational therapist.  [facility] [phone]. [address] [place], [place] [address]       Ancel Peters, OTR/L,CLT 08/16/2024, 4:49 PM

## 2024-08-17 ENCOUNTER — Ambulatory Visit: Admitting: Family

## 2024-08-18 ENCOUNTER — Ambulatory Visit
Admission: RE | Admit: 2024-08-18 | Discharge: 2024-08-18 | Disposition: A | Source: Ambulatory Visit | Attending: Oncology

## 2024-08-18 DIAGNOSIS — R9230 Dense breasts, unspecified: Secondary | ICD-10-CM | POA: Diagnosis not present

## 2024-08-18 DIAGNOSIS — C50911 Malignant neoplasm of unspecified site of right female breast: Secondary | ICD-10-CM

## 2024-08-18 DIAGNOSIS — C50411 Malignant neoplasm of upper-outer quadrant of right female breast: Secondary | ICD-10-CM | POA: Diagnosis not present

## 2024-08-18 MED ORDER — GADOBUTROL 1 MMOL/ML IV SOLN
7.5000 mL | Freq: Once | INTRAVENOUS | Status: AC | PRN
  Administered 2024-08-18: 7.5 mL via INTRAVENOUS

## 2024-08-21 ENCOUNTER — Telehealth: Payer: Self-pay | Admitting: Licensed Clinical Social Worker

## 2024-08-22 ENCOUNTER — Ambulatory Visit: Payer: Self-pay | Admitting: Oncology

## 2024-08-22 ENCOUNTER — Encounter: Payer: Self-pay | Admitting: General Surgery

## 2024-08-22 ENCOUNTER — Ambulatory Visit (INDEPENDENT_AMBULATORY_CARE_PROVIDER_SITE_OTHER): Payer: Self-pay | Admitting: General Surgery

## 2024-08-22 VITALS — BP 160/100 | HR 105 | Temp 98.9°F | Ht 63.0 in | Wt 177.6 lb

## 2024-08-22 DIAGNOSIS — C50211 Malignant neoplasm of upper-inner quadrant of right female breast: Secondary | ICD-10-CM | POA: Diagnosis not present

## 2024-08-22 DIAGNOSIS — C50911 Malignant neoplasm of unspecified site of right female breast: Secondary | ICD-10-CM

## 2024-08-22 DIAGNOSIS — Z170 Estrogen receptor positive status [ER+]: Secondary | ICD-10-CM | POA: Diagnosis not present

## 2024-08-22 NOTE — Patient Instructions (Signed)
 We have spoken today about removing a lump in your breast. This will be done by Dr. [name] at [facility].  If you are on any injectable weight loss medication, you will need to stop taking your GLP-1 injectable (weight loss) medications 8 days before your surgery to avoid any complications with anesthesia.   You will most likely be able to leave the hospital several hours after your surgery. Rarely, a patient needs to stay over night but this is a possibility.  Plan to tentatively be off work for 1-2 weeks following the surgery and may return with approximately 2 more weeks of a lifting restriction, no greater than 15 lbs.  Please see your Blue surgery sheet for more information. Our surgery scheduler will call you to look at surgery dates and to go over information.   If you have FMLA or Disability paperwork that needs to be filled out, please have your company fax your paperwork to [phone] or you may drop this by either office. This paperwork will be filled out within 3 days after your surgery has been completed.    Lumpectomy A lumpectomy is a form of breast conserving or breast preservation surgery. It may also be referred to as a partial mastectomy. During a lumpectomy, the portion of the breast that contains the cancerous tumor or breast mass (the lump) is removed. Some normal tissue around the lump may also be removed to make sure all of the tumor has been removed.  LET [facility] CARE PROVIDER KNOW ABOUT: Any allergies you have. All medicines you are taking, including vitamins, herbs, eye drops, creams, and over-the-counter medicines. Previous problems you or members of your family have had with the use of anesthetics. Any blood disorders you have. Previous surgeries you have had. Medical conditions you have. RISKS AND COMPLICATIONS Generally, this is a safe procedure. However, problems can occur and include: Bleeding. Infection. Pain. Temporary swelling. Change in the  shape of the breast, particularly if a large portion is removed. BEFORE THE PROCEDURE Ask your health care provider about changing or stopping your regular medicines. This is especially important if you are taking diabetes medicines or blood thinners. Do not eat or drink anything after midnight on the night before the procedure or as directed by your health care provider. Ask your health care provider if you can take a sip of water with any approved medicines. On the day of surgery, your health care provider will use a mammogram or ultrasound to locate and mark the tumor in your breast. These markings on your breast will show where the cut (incision) will be made.   PROCEDURE  An IV tube will be put into one of your veins. You may be given medicine to help you relax before the surgery (sedative). You will be given one of the following: A medicine that numbs the area (local anesthetic). A medicine that makes you fall asleep (general anesthetic). Your health care provider will use a kind of electric scalpel that uses heat to minimize bleeding (electrocautery knife). A curved incision (like a smile or frown) that follows the natural curve of your breast is made, to allow for minimal scarring and better healing. The tumor will be removed with some of the surrounding tissue. This will be sent to the lab for analysis. Your health care provider may also remove your lymph nodes at this time if needed. Sometimes, but not always, a rubber tube called a drain will be surgically inserted into your breast area or  armpit to collect excess fluid that may accumulate in the space where the tumor was. This drain is connected to a plastic bulb on the outside of your body. This drain creates suction to help remove the fluid. The incisions will be closed with stitches (sutures). A bandage may be placed over the incisions. AFTER THE PROCEDURE You will be taken to the recovery area. You will be given medicine for  pain. A small rubber drain may be placed in the breast for 2-3 days to prevent a collection of blood (hematoma) from developing in the breast. You will be given instructions on caring for the drain before you go home. A pressure bandage (dressing) will be applied for 1-2 days to prevent bleeding. Ask your health care provider how to care for your bandage at home.   This information is not intended to replace advice given to you by your health care provider. Make sure you discuss any questions you have with your health care provider.   Document Released: 10/05/2006 Document Revised: 09/14/2014 Document Reviewed: 01/27/2013 Elsevier Interactive Patient Education [employer]

## 2024-08-23 ENCOUNTER — Encounter: Payer: Self-pay | Admitting: *Deleted

## 2024-08-23 ENCOUNTER — Other Ambulatory Visit: Payer: Self-pay | Admitting: General Surgery

## 2024-08-23 DIAGNOSIS — R928 Other abnormal and inconclusive findings on diagnostic imaging of breast: Secondary | ICD-10-CM

## 2024-08-23 DIAGNOSIS — C50919 Malignant neoplasm of unspecified site of unspecified female breast: Secondary | ICD-10-CM

## 2024-08-23 NOTE — Progress Notes (Signed)
 Lumpectomy is scheduled for 1/7.  She will see Dr. Babara and Dr. Lenn on 1/28.  Breast MRI showed an abnormal axillary lymph node and axillary ultrasound with possible biopsy is recommended.   Reached out to [facility] and they will get that scheduled.

## 2024-08-23 NOTE — Progress Notes (Signed)
 Patient ID: Judith Drake, female   DOB: Mar 20, 1971, 53 y.o.   MRN: [id number] CC: Right Breast Cancer History of Present Illness Judith Drake is a 53 y.o. female with past medical history significant for alcohol abuse and tobacco abuse who presents in consultation for right breast cancer.  The patient underwent a screening mammography that showed concerning lesions.  She under went then a diagnostic mammography that showed 3 lesions.  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  She reports that she did not have any overlying skin changes or nipple retraction.  She did not have any nipple discharge.  She has never had an abnormal mammogram before.  She does not have a strong history of breast cancer.  She had menarche at age 54 or 28.  She had her first live birth at 19 and is postmenopausal.  She does have a family history of prostate, colon lung and liver cancer.  Past Medical History Past Medical History:  Diagnosis Date   Abnormal uterine bleeding (AUB) 03/20/2019   Acute lower UTI 05/12/2019   Acute respiratory failure with hypoxia (HCC) 04/10/2018   Anemia    Arthritis    lower back and hips   Dysmenorrhea 03/20/2019   Elevated liver enzymes 11/24/2013   ETOH abuse    Fatty liver 10/03/2019   Fibroid    GERD (gastroesophageal reflux disease)    Heavy menstrual period    Hypertension    Menorrhagia 12/13/2017   SIRS (systemic inflammatory response syndrome) (HCC) 05/12/2019       Past Surgical History:  Procedure Laterality Date   BACK SURGERY     BREAST BIOPSY Right 08/07/2024   MM RT BREAST BX W LOC DEV EA AD LESION IMG BX SPEC STEREO GUIDE 08/07/2024 [facility]   BREAST BIOPSY Right 08/07/2024   US  RT BREAST BX W LOC DEV 1ST LESION IMG BX SPEC US  GUIDE 08/07/2024 [facility]   BREAST BIOPSY Right 08/07/2024   MM RT BREAST BX W LOC DEV 1ST LESION IMAGE BX SPEC STEREO GUIDE 08/07/2024 [facility]   COLONOSCOPY N/A 03/20/2024    Procedure: COLONOSCOPY;  Surgeon: Maryruth Ole DASEN, MD;  Location: [facility] ENDOSCOPY;  Service: Endoscopy;  Laterality: N/A;   CYSTOSCOPY N/A 12/19/2019   Procedure: CYSTOSCOPY;  Surgeon: Izell Harari, MD;  Location: [facility] OR;  Service: Gynecology;  Laterality: N/A;   ELBOW SURGERY     1997    ORIF ANKLE FRACTURE Right 03/25/2022   Procedure: OPEN REDUCTION INTERNAL FIXATION (ORIF) ANKLE FRACTURE;  Surgeon: Addie Cordella Hamilton, MD;  Location: [facility] OR;  Service: Orthopedics;  Laterality: Right;   POLYPECTOMY  03/20/2024   Procedure: POLYPECTOMY, INTESTINE;  Surgeon: Maryruth Ole DASEN, MD;  Location: [facility] ENDOSCOPY;  Service: Endoscopy;;   TOTAL LAPAROSCOPIC HYSTERECTOMY WITH SALPINGECTOMY Bilateral 12/19/2019   Procedure: TOTAL LAPAROSCOPIC HYSTERECTOMY WITH BILATERAL SALPINGECTOMY;  Surgeon: Izell Harari, MD;  Location: [facility] OR;  Service: Gynecology;  Laterality: Bilateral;   TUBAL LIGATION      Allergies[1]  Current Outpatient Medications  Medication Sig Dispense Refill   amLODipine  (NORVASC ) 10 MG tablet Take 1 tablet (10 mg total) by mouth daily. 90 tablet 3   Aspirin -Salicylamide-Caffeine (BC HEADACHE POWDER PO) Take 1 packet by mouth 2 (two) times daily as needed (for pain, headaches, or discomfort).     busPIRone  (BUSPAR ) 5 MG tablet Take 1 tablet (5 mg total) by mouth 3 (three) times daily as needed. 60 tablet 1   DULoxetine  (CYMBALTA ) 30 MG  capsule TAKE TWO CAPSULES (60 MG TOTAL) BY MOUTH DAILY. 180 capsule 3   meloxicam  (MOBIC ) 7.5 MG tablet TAKE ONE (1) TO TWO (2) TABLETS (7.5-15 MG TOTAL) BY MOUTH DAILY AS NEEDED FOR PAIN. 60 tablet 2   omeprazole  (PRILOSEC) 20 MG capsule TAKE ONE CAPSULE (20 MG TOTAL) BY MOUTH DAILY. 90 capsule 3   ondansetron  (ZOFRAN ) 4 MG tablet Take 1 tablet (4 mg total) by mouth every 8 (eight) hours as needed for nausea or vomiting. 12 tablet 0   No current facility-administered medications for this visit.    Family History Family History  Problem  Relation Age of Onset   Lung cancer Mother 19   Hyperlipidemia Mother    Hypertension Mother    Stroke Mother    Kidney disease Mother    Diabetes Mother    Heart disease Mother    Depression Mother    Hyperlipidemia Father    Lung cancer Maternal Aunt    Cancer Maternal Uncle        unknown type   Cancer Maternal Uncle        unknown type   Colon cancer Paternal Aunt        dx >50   Prostate cancer Paternal Uncle        dx >50   Lung cancer Maternal Grandmother    Stomach cancer Maternal Grandfather    Prostate cancer Paternal Grandfather 10   Autism Son    Lung cancer Maternal Cousin    Cancer Maternal Cousin        unknown   Ovarian cancer Maternal Cousin    Breast cancer Neg Hx        Social History Social History[2]  Drinks 6-8 drinks per day and also uses tobacco products.   ROS Full ROS of systems performed and is otherwise negative there than what is stated in the HPI  Physical Exam Blood pressure (!) 160/100, pulse (!) 105, temperature 98.9 F (37.2 C), temperature source Oral, height 5' 3 (1.6 m), weight 177 lb 9.6 oz (80.6 kg), SpO2 96%. Alert and oriented x 3, normal work of breathing room air, tearful at times during exam but affect appropriate, moving all extremities spontaneously, abdomen soft, nontender nondistended, breast exam performed in the presence of a chaperone.  Left breast there is no axillary lymphadenopathy.  There are no overlying skin changes to the left breast or nipple retraction.  On the right breast there is bruising throughout the breast with some induration where her biopsy sites were.  There is no dimpling of the skin or nipple discharge.  She has no right axillary lymphadenopathy.  Data Reviewed I reviewed her mammograms and biopsy findings.  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  Her MRI did not show any other concerning lesions but there is a lymph node that was  concerning.  I have personally reviewed the patient's imaging and medical records.    Assessment    Patient with right breast cancer.  Plan    I discussed extensively the treatment options for breast cancer.  The options include a lumpectomy with sentinel lymph node biopsy.  Other options include a mastectomy or double mastectomy plus or minus reconstruction.  I briefly went over the risk, benefits alternatives of each procedure and the patient has elected to undergo a lumpectomy with sentinel lymph node biopsy.  I discussed the risk, benefits alternatives in depth about this procedure including the risk  of infection, bleeding which she is at increased risk for both given her alcohol use and tobacco use.  I also discussed the risk of positive margins and for any reneed for reexcision.  I discussed with her that if she undergoes a lumpectomy then it would be recommended that she undergo adjuvant radiation therapy and given her hormonal status it would be recommended that she complete endocrine therapy for 5 years.  I discussed the risk of lymphadenopathy and the role of sentinel lymph node biopsy and Oncotype in determining whether she needs chemotherapy.  She understands these risk and wishes to with surgery.  We will plan for Judith Drake placement.  A total of 65 minutes was spent reviewing the patient's chart, performing history and physical and discussing treatment options with the patient  Jayson MALVA Endow     [1]  Allergies Allergen Reactions   Amoxicillin  Nausea And Vomiting    Has patient had a PCN reaction causing immediate rash, facial/tongue/throat swelling, SOB or lightheadedness with hypotension: No Has patient had a PCN reaction causing severe rash involving mucus membranes or skin necrosis: No Has patient had a PCN reaction that required hospitalization: No Has patient had a PCN reaction occurring within the last 10 years: No If all of the above answers are NO, then may  proceed with Cephalosporin use.    Dilaudid  [Hydromorphone  Hcl] Itching   Morphine  And Codeine Other (See Comments)    Hallucinations   [2]  Social History Tobacco Use   Smoking status: Some Days    Current packs/day: 0.50    Types: Cigarettes   Smokeless tobacco: Never  Vaping Use   Vaping status: Never Used  Substance Use Topics   Alcohol use: Yes    Comment: She drinks vodka and cranberry, approx 6 -8 mini bottles per day   Drug use: No

## 2024-08-29 ENCOUNTER — Other Ambulatory Visit: Payer: Self-pay | Admitting: Nurse Practitioner

## 2024-08-29 DIAGNOSIS — I10 Essential (primary) hypertension: Secondary | ICD-10-CM

## 2024-09-01 ENCOUNTER — Other Ambulatory Visit: Payer: Self-pay

## 2024-09-05 ENCOUNTER — Ambulatory Visit: Payer: Self-pay | Admitting: Licensed Clinical Social Worker

## 2024-09-05 ENCOUNTER — Ambulatory Visit
Admission: RE | Admit: 2024-09-05 | Discharge: 2024-09-05 | Disposition: A | Discharge status: Home / Self Care | Source: Ambulatory Visit | Attending: General Surgery | Admitting: General Surgery

## 2024-09-05 DIAGNOSIS — R928 Other abnormal and inconclusive findings on diagnostic imaging of breast: Secondary | ICD-10-CM | POA: Insufficient documentation

## 2024-09-05 DIAGNOSIS — R590 Localized enlarged lymph nodes: Secondary | ICD-10-CM | POA: Insufficient documentation

## 2024-09-05 DIAGNOSIS — N6011 Diffuse cystic mastopathy of right breast: Secondary | ICD-10-CM | POA: Insufficient documentation

## 2024-09-05 DIAGNOSIS — Z1379 Encounter for other screening for genetic and chromosomal anomalies: Secondary | ICD-10-CM

## 2024-09-05 HISTORY — PX: BREAST BIOPSY: SHX20

## 2024-09-05 MED ORDER — LIDOCAINE-EPINEPHRINE 1 %-1:100000 IJ SOLN
5.0000 mL | Freq: Once | INTRAMUSCULAR | Status: AC
  Administered 2024-09-05: 5 mL

## 2024-09-05 MED ORDER — LIDOCAINE 1 % OPTIME INJ - NO CHARGE
5.0000 mL | Freq: Once | INTRAMUSCULAR | Status: AC
  Administered 2024-09-05: 5 mL
  Filled 2024-09-05: qty 6

## 2024-09-05 MED ORDER — LIDOCAINE-EPINEPHRINE 1 %-1:100000 IJ SOLN
20.0000 mL | Freq: Once | INTRAMUSCULAR | Status: AC
  Administered 2024-09-05: 20 mL
  Filled 2024-09-05: qty 20

## 2024-09-05 MED ORDER — LIDOCAINE 1 % OPTIME INJ - NO CHARGE
2.0000 mL | Freq: Once | INTRAMUSCULAR | Status: AC
  Administered 2024-09-05: 2 mL
  Filled 2024-09-05: qty 2

## 2024-09-05 NOTE — Telephone Encounter (Signed)
 I contacted Ms. Krysiak via [employer] to discuss her genetic testing results. No pathogenic variants were identified in the 77 genes analyzed. Detailed clinic note to follow.   The test report has been scanned into EPIC and is located under the Molecular Pathology section of the Results Review tab.  A portion of the result report is included below for reference.      Dena Cary, MS, [facility] Genetic Counselor [email or web address] Phone: [phone]

## 2024-09-05 NOTE — Progress Notes (Signed)
 HPI:   Ms. Judith Drake was previously seen in the [facility] due to a personal and family history of cancer and concerns regarding a hereditary predisposition to cancer. Please refer to our prior cancer genetics clinic note for more information regarding our discussion, assessment and recommendations, at the time. Ms. Judith Drake recent genetic test results were disclosed to her, as were recommendations warranted by these results. These results and recommendations are discussed in more detail below.  CANCER HISTORY:  Oncology History  Invasive ductal carcinoma of breast, female, right (HCC)  07/25/2024 Mammogram   B/L screening mammogram showed Further evaluation is suggested for possible distortion, separate asymmetries and calcifications in the right breast   07/31/2024 Mammogram   Right breast diagostic mammogram 1. Highly suspicious architectural distortion in the RIGHT breast at 1 o'clock with associated microcalcifications, correlating with a 5 mm spiculated mass on ultrasound. Ultrasound-guided biopsy is recommended.   2. Suspicious segmental microcalcifications involving the upper inner quadrant of the RIGHT breast spanning approximately 8 x 7 x 6 cm. Recommend ultrasound-guided biopsy of amorphous calcifications near the anterior extent at the discretion of the performing radiologist; the architectural distortion and calcifications (recommended for ultrasound-guided biopsy above) provides a reasonable approximation of the calcifications' posterior extent.   3. Grouped amorphous calcifications spanning 4 mm in the LATERAL RIGHT breast approximate 9 o'clock position. Recommend stereotactic biopsy of these calcifications at the time of the above recommended biopsies.   4.  No RIGHT axillary lymphadenopathy seen on ultrasound.   08/07/2024 Initial Diagnosis   Invasive ductal carcinoma of breast, female, right [facility])  Patient underwent right breast biopsy  1.  Breast, right, needle core biopsy, 1 o'clock 6cm (coil clip) :      INVASIVE DUCTAL CARCINOMA      TUBULE FORMATION: SCORE 1      NUCLEAR PLEOMORPHISM: SCORE 1      MITOTIC COUNT: SCORE 1      TOTAL SCORE: 3      OVERALL GRADE: 1      LYMPHOVASCULAR INVASION: NOT IDENTIFIED      CANCER LENGTH: 8 MM / 0.8 MM      CALCIFICATIONS: NOT IDENTIFIED      OTHER FINDINGS: NONE      ER 100%, PR 95% HER2 (1+)       2. Breast, right, needle core biopsy, UIQ anterior (x clip) :      BENIGN BREAST TISSUE WITH FIBROCYSTIC CHANGES INCLUDING STROMAL FIBROSIS, CYSTS,      USUAL DUCTAL HYPERPLASIA AND CALCIFICATIONS.      NEGATIVE FOR ATYPIA OR MALIGNANCY.       3. Breast, right, needle core biopsy, lateral (ribbon clip) :      BENIGN BREAST TISSUE WITH FIBROCYSTIC CHANGES INCLUDING STROMAL FIBROSIS,      SCLEROSING ADENOSIS, CYSTS, USUAL DUCTAL HYPERPLASIA AND CALCIFICATIONS.      NEGATIVE FOR ATYPIA OR MALIGNANCY.   Menarche at age of 59 or 45 First live birth at age of 65 OCP use: previous use > 5 years  History of hysterectomy: yes. She has ovaries.  Menopausal status: postmenopausal History of HRT use: no  History of chest radiation: no Number of previous breast biopsies:  no  Family history positive for multiple family members with prostate, colon, lung liver cancer. No family history of breast cancer    08/10/2024 Cancer Staging   Staging form: Breast, AJCC 8th Edition - Clinical stage from 08/10/2024: Stage Unknown (cTX, cN0, cM0, G1, ER+, PR+, HER2-) -  Signed by Babara Call, MD on 08/10/2024 Stage prefix: Initial diagnosis Histologic grading system: 3 grade system    Genetic Testing   Negative genetic testing on the [employer] BRCPlus Panel  + CancerNext-Expanded+RNA Panel. Final report date is 08/20/2024.  The [employer] Panel includes sequencing, rearrangement analysis, and RNA analysis for the following 13 genes: ATM, BARD1, BRCA1, BRCA2, CDH1, CHEK2, NF1, PALB2, PTEN, RAD51C, RAD51D,  STK11 and TP53 (sequencing and deletion/duplication).  The CancerNext-Expanded gene panel offered by [facility] and includes sequencing, rearrangement, and RNA analysis for the following 77 genes: AIP, ALK, APC, ATM, AXIN2, BAP1, BARD1, BMPR1A, BRCA1, BRCA2, BRIP1, CDC73, CDH1, CDK4, CDKN1B, CDKN2A, CEBPA, CHEK2, CTNNA1, DDX41, DICER1, ETV6, FH, FLCN, GATA2, LZTR1, MAX, MBD4, MEN1, MET, MLH1, MSH2, MSH3, MSH6, MUTYH, NF1, NF2, NTHL1, PALB2, PHOX2B, PMS2, POT1, PRKAR1A, PTCH1, PTEN, RAD51C, RAD51D, RB1, RET, RPS20, RUNX1, SDHA, SDHAF2, SDHB, SDHC, SDHD, SMAD4, SMARCA4, SMARCB1, SMARCE1, STK11, SUFU, TMEM127, TP53, TSC1, TSC2, VHL, and WT1 (sequencing and deletion/duplication); EGFR, HOXB13, KIT, MITF, PDGFRA, POLD1, and POLE (sequencing only); EPCAM and GREM1 (deletion/duplication only).      FAMILY HISTORY:  We obtained a detailed, 4-generation family history.  Significant diagnoses are listed below: Family History  Problem Relation Age of Onset   Lung cancer Mother 50   Hyperlipidemia Mother    Hypertension Mother    Stroke Mother    Kidney disease Mother    Diabetes Mother    Heart disease Mother    Depression Mother    Hyperlipidemia Father    Lung cancer Maternal Aunt    Cancer Maternal Uncle        unknown type   Cancer Maternal Uncle        unknown type   Colon cancer Paternal Aunt        dx >50   Prostate cancer Paternal Uncle        dx >50   Lung cancer Maternal Grandmother    Stomach cancer Maternal Grandfather    Prostate cancer Paternal Grandfather 75   Autism Son    Lung cancer Maternal Cousin    Cancer Maternal Cousin        unknown   Ovarian cancer Maternal Cousin    Breast cancer Neg Hx        Ms. Judith Drake is unaware of previous family history of genetic testing for hereditary cancer risks. There is no reported Ashkenazi Jewish ancestry. There is no known consanguinity.  GENETIC TEST RESULTS:  The [employer] [facility]us+ CancerNext-Expanded+RNA Panel found no  pathogenic mutations.   The [employer] Panel includes sequencing, rearrangement analysis, and RNA analysis for the following 13 genes: ATM, BARD1, BRCA1, BRCA2, CDH1, CHEK2, NF1, PALB2, PTEN, RAD51C, RAD51D, STK11 and TP53 (sequencing and deletion/duplication).  The CancerNext-Expanded gene panel offered by [facility] and includes sequencing, rearrangement, and RNA analysis for the following 77 genes: AIP, ALK, APC, ATM, AXIN2, BAP1, BARD1, BMPR1A, BRCA1, BRCA2, BRIP1, CDC73, CDH1, CDK4, CDKN1B, CDKN2A, CEBPA, CHEK2, CTNNA1, DDX41, DICER1, ETV6, FH, FLCN, GATA2, LZTR1, MAX, MBD4, MEN1, MET, MLH1, MSH2, MSH3, MSH6, MUTYH, NF1, NF2, NTHL1, PALB2, PHOX2B, PMS2, POT1, PRKAR1A, PTCH1, PTEN, RAD51C, RAD51D, RB1, RET, RPS20, RUNX1, SDHA, SDHAF2, SDHB, SDHC, SDHD, SMAD4, SMARCA4, SMARCB1, SMARCE1, STK11, SUFU, TMEM127, TP53, TSC1, TSC2, VHL, and WT1 (sequencing and deletion/duplication); EGFR, HOXB13, KIT, MITF, PDGFRA, POLD1, and POLE (sequencing only); EPCAM and GREM1 (deletion/duplication only).   The test report has been scanned into EPIC and is located under the Molecular Pathology section of the Results Review tab.  A portion of the result report is included below for reference. Genetic testing reported out on 08/20/2024.     Even though a pathogenic variant was not identified, possible explanations for the cancer in the family may include: There may be no hereditary risk for cancer in the family. The cancers in Ms. Judith Drake and/or her family may be sporadic/familial or due to other genetic and environmental factors. There may be a gene mutation in one of these genes that current testing methods cannot detect but that chance is small. There could be another gene that has not yet been discovered, or that we have not yet tested, that is responsible for the cancer diagnoses in the family.  It is also possible there is a hereditary cause for the cancer in the family that Ms. Judith Drake did not  inherit.  Therefore, it is important to remain in touch with cancer genetics in the future so that we can continue to offer Ms. Judith Drake the most up to date genetic testing.   ADDITIONAL GENETIC TESTING:  We discussed with Ms. Judith Drake that her genetic testing was fairly extensive.  If there are additional relevant genes identified to increase cancer risk that can be analyzed in the future, we would be happy to discuss and coordinate this testing at that time.    CANCER SCREENING RECOMMENDATIONS:  Ms. Judith Drake test result is considered negative (normal).  This means that we have not identified a hereditary cause for her personal and family history of cancer at this time.   An individual's cancer risk and medical management are not determined by genetic test results alone. Overall cancer risk assessment incorporates additional factors, including personal medical history, family history, and any available genetic information that may result in a personalized plan for cancer prevention and surveillance. Therefore, it is recommended she continue to follow the cancer management and screening guidelines provided by her oncology and primary healthcare provider.  RECOMMENDATIONS FOR FAMILY MEMBERS:   Since she did not inherit a identifiable mutation in a cancer predisposition gene included on this panel, her children could not have inherited a known mutation from her in one of these genes. Individuals in this family might be at some increased risk of developing cancer, over the general population risk, due to the family history of cancer.  Individuals in the family should notify their providers of the family history of cancer. We recommend women in this family have a yearly mammogram beginning at age 37, or 75 years younger than the earliest onset of cancer, an annual clinical breast exam, and perform monthly breast self-exams.  Family members should have colonoscopies by at age 38, or earlier, as recommended  by their providers. Other members of the family may still carry a pathogenic variant in one of these genes that Ms. Judith Drake did not inherit. Based on the family history, we recommend her maternal relatives closely related to her cousin with ovarian cancer have genetic counseling and testing. Ms. Judith Drake will let us  know if we can be of any assistance in coordinating genetic counseling and/or testing for this family member.     FOLLOW-UP:  Lastly, we discussed with Ms. Judith Drake that cancer genetics is a rapidly advancing field and it is possible that new genetic tests will be appropriate for her and/or her family members in the future. We encouraged her to remain in contact with cancer genetics on an annual basis so we can update her personal and family histories and let her know of advances in cancer  genetics that may benefit this family.   Our contact number was provided. Ms. Judith Drake questions were answered to her satisfaction, and she knows she is welcome to call us  at anytime with additional questions or concerns.    Dena Cary, MS, [facility] Genetic Counselor [email or web address] Phone: [phone]

## 2024-09-06 LAB — SURGICAL PATHOLOGY

## 2024-09-08 ENCOUNTER — Telehealth: Payer: Self-pay | Admitting: General Surgery

## 2024-09-08 NOTE — Telephone Encounter (Signed)
 Pt said that someone had called and said that her surgery has been cancelled but she has had her imaging done and it is fine. Wants to know if she can she can still have the surgery Jan 7th 2026. And also her insurance has changed to  [facility] # [id number].  Please advise the patient at [phone]

## 2024-09-11 ENCOUNTER — Telehealth: Payer: Self-pay

## 2024-09-11 DIAGNOSIS — C50919 Malignant neoplasm of unspecified site of unspecified female breast: Secondary | ICD-10-CM

## 2024-09-11 NOTE — Telephone Encounter (Signed)
 Spoke with the patient about her new insurance. Per the patient the insurance company told her that she would not show up in their system until up to two weeks after signing up with her new insurance. Patient decided to reschedule her surgery to Friday January 16th so that he insurance would be accessible for any needed prior authorization for surgery.   The patient is rescheduled for surgery on 09/22/24. Her arrival time is 8:30 am and she will report to [facility] for her sentinel injection.  Pre admit testing will call the patient to schedule a phone interview.

## 2024-09-12 ENCOUNTER — Other Ambulatory Visit

## 2024-09-13 ENCOUNTER — Inpatient Hospital Stay: Payer: Self-pay | Attending: Oncology | Admitting: Hospice and Palliative Medicine

## 2024-09-13 ENCOUNTER — Encounter

## 2024-09-13 ENCOUNTER — Ambulatory Visit: Payer: Self-pay

## 2024-09-13 ENCOUNTER — Ambulatory Visit: Admit: 2024-09-13 | Payer: Self-pay | Admitting: General Surgery

## 2024-09-13 DIAGNOSIS — C50911 Malignant neoplasm of unspecified site of right female breast: Secondary | ICD-10-CM

## 2024-09-13 SURGERY — BREAST LUMPECTOMY WITH RADIO FREQUENCY LOCALIZER
Anesthesia: General | Site: Breast | Laterality: Right

## 2024-09-13 NOTE — Progress Notes (Signed)
 Multidisciplinary Oncology Council Documentation  ALVENIA TREESE was presented by our [facility] on 09/13/2024, which included representatives from:  Palliative Care Dietitian  Physical/Occupational Therapist Nurse Navigator Genetics Social work Survivorship RN Financial Navigator Research RN   Shatora currently presents with history of breast cancer  We reviewed previous medical and familial history, history of present illness, and recent lab results along with all available histopathologic and imaging studies. The MOC considered available treatment options and made the following recommendations/referrals:  Rehab screening  The MOC is a meeting of clinicians from various specialty areas who evaluate and discuss patients for whom a multidisciplinary approach is being considered. Final determinations in the plan of care are those of the provider(s).   Today's extended care, comprehensive team conference, Cartha was not present for the discussion and was not examined.

## 2024-09-15 ENCOUNTER — Ambulatory Visit: Payer: Self-pay | Admitting: General Surgery

## 2024-09-15 ENCOUNTER — Other Ambulatory Visit: Payer: Self-pay

## 2024-09-15 ENCOUNTER — Encounter
Admission: RE | Admit: 2024-09-15 | Discharge: 2024-09-15 | Disposition: A | Payer: Self-pay | Source: Ambulatory Visit | Attending: General Surgery

## 2024-09-15 DIAGNOSIS — Z01810 Encounter for preprocedural cardiovascular examination: Secondary | ICD-10-CM

## 2024-09-15 DIAGNOSIS — C50011 Malignant neoplasm of nipple and areola, right female breast: Secondary | ICD-10-CM

## 2024-09-15 DIAGNOSIS — C50911 Malignant neoplasm of unspecified site of right female breast: Secondary | ICD-10-CM

## 2024-09-15 DIAGNOSIS — I10 Essential (primary) hypertension: Secondary | ICD-10-CM

## 2024-09-15 HISTORY — DX: Pneumonia, unspecified organism: J18.9

## 2024-09-15 HISTORY — DX: Prediabetes: R73.03

## 2024-09-15 HISTORY — DX: Cerebral infarction, unspecified: I63.9

## 2024-09-15 HISTORY — DX: Anxiety disorder, unspecified: F41.9

## 2024-09-15 HISTORY — DX: Malignant (primary) neoplasm, unspecified: C80.1

## 2024-09-15 HISTORY — DX: Depression, unspecified: F32.A

## 2024-09-15 NOTE — Patient Instructions (Addendum)
 Your procedure is scheduled on: 09/22/24 - Friday Report to the Registration Desk on the 1st floor of the [facility]. To find out your arrival time, please call [phone] between 1PM - 3PM on: 09/21/24 - Thursday If your arrival time is 6:00 am, do not arrive before that time as the Medical Mall entrance doors do not open until 6:00 am.  REMEMBER: Instructions that are not followed completely may result in serious medical risk, up to and including death; or upon the discretion of your surgeon and anesthesiologist your surgery may need to be rescheduled.  Do not eat food or drink any liquids after midnight the night before surgery.  No gum chewing or hard candies.  One week prior to surgery: Stop Anti-inflammatories (NSAIDS) such as Advil , Aleve , Ibuprofen , Motrin , Naproxen , Naprosyn  and Aspirin  based products such as Excedrin, Goody's Powder, BC Powder. You may take Tylenol  if needed for pain up until the day of surgery.  Stop ANY OVER THE COUNTER supplements until after surgery.  meloxicam  (MOBIC ) hold beginning 09/15/24.  ON THE DAY OF SURGERY ONLY TAKE THESE MEDICATIONS WITH SIPS OF WATER :  amLODipine  (NORVASC ) 10 MG tablet  DULoxetine  (CYMBALTA )  omeprazole  (PRILOSEC)    No Alcohol for 24 hours before or after surgery.  No Smoking including e-cigarettes for 24 hours before surgery.  No chewable tobacco products for at least 6 hours before surgery.  No nicotine  patches on the day of surgery.  Do not use any recreational drugs for at least a week (preferably 2 weeks) before your surgery.  Please be advised that the combination of cocaine and anesthesia may have negative outcomes, up to and including death. If you test positive for cocaine, your surgery will be cancelled.  On the morning of surgery brush your teeth with toothpaste and water , you may rinse your mouth with mouthwash if you wish. Do not swallow any toothpaste or mouthwash.  Use CHG Soap or wipes as  directed on instruction sheet.  Do not wear jewelry, make-up, hairpins, clips or nail polish.  For welded (permanent) jewelry: bracelets, anklets, waist bands, etc.  Please have this removed prior to surgery.  If it is not removed, there is a chance that hospital personnel will need to cut it off on the day of surgery.  Do not wear lotions, powders, or perfumes.   Do not shave body hair from the neck down 48 hours before surgery.  Contact lenses, hearing aids and dentures may not be worn into surgery.  Do not bring valuables to the hospital. [facility] is not responsible for any missing/lost belongings or valuables.   Notify your doctor if there is any change in your medical condition (cold, fever, infection).  Wear comfortable clothing (specific to your surgery type) to the hospital.  After surgery, you can help prevent lung complications by doing breathing exercises.  Take deep breaths and cough every 1-2 hours. Your doctor may order a device called an Incentive Spirometer to help you take deep breaths.  If you are being admitted to the hospital overnight, leave your suitcase in the car. After surgery it may be brought to your room.  In case of increased patient census, it may be necessary for you, the patient, to continue your postoperative care in the Same Day Surgery department.  If you are being discharged the day of surgery, you will not be allowed to drive home. You will need a responsible individual to drive you home and stay with you for 24 hours  after surgery.   If you are taking public transportation, you will need to have a responsible individual with you.  Please call the Pre-admissions Testing Dept. at [phone] if you have any questions about these instructions.  Surgery Visitation Policy:  Patients having surgery or a procedure may have two visitors.  Children under the age of 8 must have an adult with them who is not the patient.  Inpatient Visitation:     Visiting hours are 7 a.m. to 8 p.m. Up to four visitors are allowed at one time in a patient room. The visitors may rotate out with other people during the day.  One visitor age 71 or older may stay with the patient overnight and must be in the room by 8 p.m.   Merchandiser, Retail to address health-related social needs:  [email or web address]                                                                                                             Preparing for Surgery with CHLORHEXIDINE  GLUCONATE (CHG) Soap  Chlorhexidine  Gluconate (CHG) Soap  o An antiseptic cleaner that kills germs and bonds with the skin to continue killing germs even after washing  o Used for showering the night before surgery and morning of surgery  Before surgery, you can play an important role by reducing the number of germs on your skin.  CHG (Chlorhexidine  gluconate) soap is an antiseptic cleanser which kills germs and bonds with the skin to continue killing germs even after washing.  Please do not use if you have an allergy to CHG or antibacterial soaps. If your skin becomes reddened/irritated stop using the CHG.  1. Shower the NIGHT BEFORE SURGERY with CHG soap.  2. If you choose to wash your hair, wash your hair first as usual with your normal shampoo.  3. After shampooing, rinse your hair and body thoroughly to remove the shampoo.  4. Use CHG as you would any other liquid soap. You can apply CHG directly to the skin and wash gently with a clean washcloth.  5. Apply the CHG soap to your body only from the neck down. Do not use on open wounds or open sores. Avoid contact with your eyes, ears, mouth, and genitals (private parts). Wash face and genitals (private parts) with your normal soap.  6. Wash thoroughly, paying special attention to the area where your surgery will be performed.  7. Thoroughly rinse your body with warm water .  8. Do not shower/wash with your normal soap  after using and rinsing off the CHG soap.  9. Do not use lotions, oils, etc., after showering with CHG.  10. Pat yourself dry with a clean towel.  11. Wear clean pajamas to bed the night before surgery.  12. Place clean sheets on your bed the night of your shower and do not sleep with pets.  13. Do not apply any deodorants/lotions/powders.  14. Please wear clean clothes to the hospital.  15. Remember to brush your teeth with your regular toothpaste.

## 2024-09-20 ENCOUNTER — Encounter
Admission: RE | Admit: 2024-09-20 | Discharge: 2024-09-20 | Disposition: A | Discharge status: Home / Self Care | Source: Ambulatory Visit | Attending: General Surgery | Admitting: General Surgery

## 2024-09-20 ENCOUNTER — Ambulatory Visit
Admission: RE | Admit: 2024-09-20 | Discharge: 2024-09-20 | Disposition: A | Discharge status: Home / Self Care | Payer: Self-pay | Source: Ambulatory Visit | Attending: General Surgery | Admitting: General Surgery

## 2024-09-20 DIAGNOSIS — C50919 Malignant neoplasm of unspecified site of unspecified female breast: Secondary | ICD-10-CM | POA: Diagnosis present

## 2024-09-20 DIAGNOSIS — I10 Essential (primary) hypertension: Secondary | ICD-10-CM

## 2024-09-20 DIAGNOSIS — Z01810 Encounter for preprocedural cardiovascular examination: Secondary | ICD-10-CM

## 2024-09-20 DIAGNOSIS — C50211 Malignant neoplasm of upper-inner quadrant of right female breast: Secondary | ICD-10-CM | POA: Diagnosis not present

## 2024-09-20 DIAGNOSIS — R000 Tachycardia, unspecified: Secondary | ICD-10-CM | POA: Insufficient documentation

## 2024-09-20 HISTORY — PX: BREAST BIOPSY: SHX20

## 2024-09-20 MED ORDER — LIDOCAINE HCL 1 % IJ SOLN
10.0000 mL | Freq: Once | INTRAMUSCULAR | Status: AC
  Administered 2024-09-20: 10 mL
  Filled 2024-09-20: qty 10

## 2024-09-21 ENCOUNTER — Ambulatory Visit (INDEPENDENT_AMBULATORY_CARE_PROVIDER_SITE_OTHER): Admitting: Family

## 2024-09-21 ENCOUNTER — Encounter: Payer: Self-pay | Admitting: Family

## 2024-09-21 VITALS — BP 136/84 | HR 76 | Temp 97.6°F | Ht 62.0 in | Wt 172.0 lb

## 2024-09-21 DIAGNOSIS — B354 Tinea corporis: Secondary | ICD-10-CM | POA: Diagnosis not present

## 2024-09-21 DIAGNOSIS — I10 Essential (primary) hypertension: Secondary | ICD-10-CM

## 2024-09-21 MED ORDER — TRIAMCINOLONE ACETONIDE 0.5 % EX OINT: 1.0000 | TOPICAL_OINTMENT | Freq: Two times a day (BID) | CUTANEOUS | 2 refills | Status: AC

## 2024-09-21 MED ORDER — AMLODIPINE BESYLATE 10 MG PO TABS: 10.0000 mg | ORAL_TABLET | Freq: Every day | ORAL | 3 refills | Status: AC

## 2024-09-21 MED ORDER — CLOTRIMAZOLE 1 % EX CREA: 1.0000 | TOPICAL_CREAM | Freq: Two times a day (BID) | CUTANEOUS | 1 refills | Status: AC

## 2024-09-21 NOTE — Assessment & Plan Note (Signed)
" °  Chronic rash on legs with central clearing suggests tinea corporis. Prescribe clotrimazole  for antifungal treatment. Use triamcinolone  0.5% sparingly for severe itching. Monitor for improvement within 24-48 hours.  She will let me know how she is doing "

## 2024-09-21 NOTE — Patient Instructions (Signed)
 I suspect tinea.  Please start with the clotrimazole  ointment as discussed.  Triamcinolone  which is a topical steroid can be used sparingly for itching  Body Ringworm Body ringworm is an infection of the skin that often causes a ring-shaped rash. Body ringworm is also called tinea corporis. Body ringworm can affect any part of your skin. This condition is easily spread from person to person (is very contagious). What are the causes? This condition is caused by fungi called dermatophytes. The condition develops when these fungi grow out of control on the skin. You can get this condition if you touch a person or animal that has it. You can also get it if you share any items with an infected person or pet. These include: Clothing, bedding, and towels. Brushes or combs. Gym equipment. Any other object that has the fungus on it. What increases the risk? You are more likely to develop this condition if you: Play sports that involve close physical contact, such as wrestling. Sweat a lot. Live in areas that are hot and humid. Use public showers. Have a weakened disease-fighting system (immune system). What are the signs or symptoms? Symptoms of this condition include: Itchy, raised red spots and bumps. Red scaly patches. A ring-shaped rash. The rash may have: A clear center. Scales or red bumps at its center. Redness near its borders. Dry and scaly skin on or around it. How is this diagnosed? This condition can usually be diagnosed with a skin exam. A skin scraping may be taken from the affected area and examined under a microscope to see if the fungus is present. How is this treated? This condition may be treated with: An antifungal cream or ointment. An antifungal shampoo. Antifungal medicines. These may be prescribed if your ringworm: Is severe. Keeps coming back or lasts a long time. Follow these instructions at home: Take over-the-counter and prescription medicines only as told by  your health care provider. If you were given an antifungal cream or ointment: Use it as told by your health care provider. Wash the infected area and dry it completely before applying the cream or ointment. If you were given an antifungal shampoo: Use it as told by your health care provider. Leave the shampoo on your body for 3-5 minutes before rinsing. While you have a rash: Wear loose clothing to stop clothes from rubbing and irritating it. Wash or change your bed sheets every night. Wash clothes and bed sheets in hot water . Disinfect or throw out items that may be infected. Wash your hands often with soap and water  for at least 20 seconds. If soap and water  are not available, use hand sanitizer. If your pet has the same infection, take your pet to see a veterinarian for treatment. How is this prevented? Take a bath or shower every day and after every time you work out or play sports. Dry your skin completely after bathing. Wear sandals or shoes in public places and showers. Wash athletic clothes after each use. Do not share personal items with others. Avoid touching red patches of skin on other people. Avoid touching pets that have bald spots. If you touch an animal that has a bald spot, wash your hands. Contact a health care provider if: Your rash continues to spread after 7 days of treatment. Your rash is not gone in 4 weeks. The area around your rash gets red, warm, tender, and swollen. This information is not intended to replace advice given to you by your health care provider.  Make sure you discuss any questions you have with your health care provider. Document Revised: 02/05/2022 Document Reviewed: 02/05/2022 Elsevier Patient Education  2024 [employer].

## 2024-09-21 NOTE — Progress Notes (Signed)
 "  Assessment & Plan:  Tinea corporis Assessment & Plan:  Chronic rash on legs with central clearing suggests tinea corporis. Prescribe clotrimazole  for antifungal treatment. Use triamcinolone  0.5% sparingly for severe itching. Monitor for improvement within 24-48 hours.  She will let me know how she is doing  Orders: -     Clotrimazole ; Apply 1 Application topically 2 (two) times daily.  Dispense: 30 g; Refill: 1 -     Triamcinolone  Acetonide; Apply 1 Application topically 2 (two) times daily. Use sparingly for < 1 week  Dispense: 15 g; Refill: 2  Essential hypertension -     amLODIPine  Besylate; Take 1 tablet (10 mg total) by mouth daily.  Dispense: 90 tablet; Refill: 3     Return precautions given.   Risks, benefits, and alternatives of the medications and treatment plan prescribed today were discussed, and patient expressed understanding.   Education regarding symptom management and diagnosis given to patient on AVS either electronically or printed.  Return in about 3 months (around 12/20/2024).  Rollene Northern, FNP  Subjective:    Patient ID: Judith Drake, female    DOB: [birth year], 54 y.o.   MRN: [id number]  CC: Judith Drake is a 54 y.o. female who presents today for follow up.   HPI: HPI Discussed the use of AI scribe software for clinical note transcription with the patient, who gave verbal consent to proceed.  History of Present Illness   Judith Drake is a 54 year old female who presents with a erythematous rash on right lower leg.    She has had a rash on her legs for a couple of months, characterized by hard, dry, and itchy skin. The rash has a heart shape with central clearing and becomes more red after applying cortisone cream. Scratching causes the skin to flake off.  She has experienced similar rashes in the past, often triggered by cold weather. She used triamcinolone  for a rash on her back, but it did not help with the current rash. Denies leg  swelling, pus, joint swelling, or fever is associated with the rash.    No follow up with Dr Leni, rheumatology   consult with general surgery 08/22/2024, Dr. [name]; lumpectomy is scheduled tomorrow.  Initial consult with Dr Babara 08/10/24 right breast cancer prolonged hospitalization from 11/04/2023 to 11/10/2023. Admitted for concern of septic arthritis, disseminated, open bursitis of the elbow, fever, polyarthritis             Allergies: Amoxicillin , Dilaudid  [hydromorphone  hcl], and Morphine  and codeine Medications Ordered Prior to Encounter[1]  Review of Systems  Constitutional:  Negative for chills and fever.  Respiratory:  Negative for cough.   Cardiovascular:  Negative for chest pain and palpitations.  Gastrointestinal:  Negative for nausea and vomiting.  Musculoskeletal:  Negative for arthralgias and joint swelling.  Skin:  Positive for rash.      Objective:    BP 136/84   Pulse 76   Temp 97.6 F (36.4 C) (Oral)   Ht 5' 2 (1.575 m)   Wt 172 lb (78 kg)   LMP  (LMP Unknown)   SpO2 96%   BMI 31.46 kg/m  BP Readings from Last 3 Encounters:  09/21/24 136/84  08/22/24 (!) 160/100  08/10/24 (!) 147/92   Wt Readings from Last 3 Encounters:  09/21/24 172 lb (78 kg)  08/22/24 177 lb 9.6 oz (80.6 kg)  08/10/24 174 lb 12.8 oz (79.3 kg)    Physical Exam Vitals reviewed.  Constitutional:  Appearance: She is well-developed.  Eyes:     Conjunctiva/sclera: Conjunctivae normal.  Cardiovascular:     Rate and Rhythm: Normal rate and regular rhythm.     Pulses: Normal pulses.     Heart sounds: Normal heart sounds.  Pulmonary:     Effort: Pulmonary effort is normal.     Breath sounds: Normal breath sounds. No wheezing, rhonchi or rales.  Musculoskeletal:     Right lower leg: No edema.     Left lower leg: No edema.  Skin:    General: Skin is warm and dry.         Comments: Approximately 5 cm x 5 cm oblong almost heart shaped erythematous patch right lateral  shin.  No purulent discharge.  Central clearing. Right medial calf 3 cm x 4 cm or oblong erythematous patch.  No purulent discharge.  Central clearing  Neurological:     Mental Status: She is alert.  Psychiatric:        Speech: Speech normal.        Behavior: Behavior normal.        Thought Content: Thought content normal.             [1]  Current Outpatient Medications on File Prior to Visit  Medication Sig Dispense Refill   Aspirin -Salicylamide-Caffeine (BC HEADACHE POWDER PO) Take 1 packet by mouth 2 (two) times daily as needed (for pain, headaches, or discomfort).     DULoxetine  (CYMBALTA ) 30 MG capsule TAKE TWO CAPSULES (60 MG TOTAL) BY MOUTH DAILY. 180 capsule 3   meloxicam  (MOBIC ) 7.5 MG tablet TAKE ONE (1) TO TWO (2) TABLETS (7.5-15 MG TOTAL) BY MOUTH DAILY AS NEEDED FOR PAIN. 60 tablet 2   omeprazole  (PRILOSEC) 20 MG capsule TAKE ONE CAPSULE (20 MG TOTAL) BY MOUTH DAILY. 90 capsule 3   oxymetazoline (AFRIN) 0.05 % nasal spray Place 1 spray into both nostrils daily as needed for congestion (Allergies).     busPIRone  (BUSPAR ) 5 MG tablet Take 1 tablet (5 mg total) by mouth 3 (three) times daily as needed. (Patient not taking: Reported on 09/21/2024) 60 tablet 1   ondansetron  (ZOFRAN ) 4 MG tablet Take 1 tablet (4 mg total) by mouth every 8 (eight) hours as needed for nausea or vomiting. (Patient not taking: Reported on 09/21/2024) 12 tablet 0   No current facility-administered medications on file prior to visit.   "

## 2024-09-22 ENCOUNTER — Other Ambulatory Visit: Payer: Self-pay

## 2024-09-22 ENCOUNTER — Other Ambulatory Visit: Payer: Self-pay | Admitting: Pathology

## 2024-09-22 ENCOUNTER — Ambulatory Visit
Admission: RE | Admit: 2024-09-22 | Discharge: 2024-09-22 | Disposition: A | Discharge status: Home / Self Care | Payer: Self-pay | Source: Ambulatory Visit | Attending: General Surgery | Admitting: General Surgery

## 2024-09-22 ENCOUNTER — Encounter: Payer: Self-pay | Admitting: General Surgery

## 2024-09-22 ENCOUNTER — Ambulatory Visit: Admitting: Anesthesiology

## 2024-09-22 ENCOUNTER — Ambulatory Visit
Admission: RE | Admit: 2024-09-22 | Discharge: 2024-09-22 | Disposition: A | Discharge status: Home / Self Care | Payer: Self-pay | Attending: General Surgery | Admitting: General Surgery

## 2024-09-22 ENCOUNTER — Ambulatory Visit
Admission: RE | Admit: 2024-09-22 | Discharge: 2024-09-22 | Disposition: A | Discharge status: Home / Self Care | Source: Ambulatory Visit | Attending: Pathology | Admitting: Pathology

## 2024-09-22 ENCOUNTER — Encounter: Admission: RE | Disposition: A | Payer: Self-pay | Source: Home / Self Care | Attending: General Surgery

## 2024-09-22 DIAGNOSIS — E119 Type 2 diabetes mellitus without complications: Secondary | ICD-10-CM | POA: Insufficient documentation

## 2024-09-22 DIAGNOSIS — Z1732 Human epidermal growth factor receptor 2 negative status: Secondary | ICD-10-CM | POA: Insufficient documentation

## 2024-09-22 DIAGNOSIS — E66813 Obesity, class 3: Secondary | ICD-10-CM | POA: Insufficient documentation

## 2024-09-22 DIAGNOSIS — C50211 Malignant neoplasm of upper-inner quadrant of right female breast: Secondary | ICD-10-CM | POA: Diagnosis not present

## 2024-09-22 DIAGNOSIS — R928 Other abnormal and inconclusive findings on diagnostic imaging of breast: Secondary | ICD-10-CM

## 2024-09-22 DIAGNOSIS — Z8673 Personal history of transient ischemic attack (TIA), and cerebral infarction without residual deficits: Secondary | ICD-10-CM | POA: Insufficient documentation

## 2024-09-22 DIAGNOSIS — F17210 Nicotine dependence, cigarettes, uncomplicated: Secondary | ICD-10-CM | POA: Diagnosis not present

## 2024-09-22 DIAGNOSIS — Z170 Estrogen receptor positive status [ER+]: Secondary | ICD-10-CM | POA: Diagnosis not present

## 2024-09-22 DIAGNOSIS — C50911 Malignant neoplasm of unspecified site of right female breast: Secondary | ICD-10-CM | POA: Insufficient documentation

## 2024-09-22 DIAGNOSIS — Z780 Asymptomatic menopausal state: Secondary | ICD-10-CM | POA: Insufficient documentation

## 2024-09-22 DIAGNOSIS — Z6830 Body mass index (BMI) 30.0-30.9, adult: Secondary | ICD-10-CM | POA: Insufficient documentation

## 2024-09-22 DIAGNOSIS — I10 Essential (primary) hypertension: Secondary | ICD-10-CM | POA: Diagnosis not present

## 2024-09-22 DIAGNOSIS — Z79899 Other long term (current) drug therapy: Secondary | ICD-10-CM | POA: Diagnosis not present

## 2024-09-22 DIAGNOSIS — Z1721 Progesterone receptor positive status: Secondary | ICD-10-CM | POA: Diagnosis not present

## 2024-09-22 DIAGNOSIS — K219 Gastro-esophageal reflux disease without esophagitis: Secondary | ICD-10-CM | POA: Insufficient documentation

## 2024-09-22 DIAGNOSIS — F419 Anxiety disorder, unspecified: Secondary | ICD-10-CM | POA: Insufficient documentation

## 2024-09-22 DIAGNOSIS — C50919 Malignant neoplasm of unspecified site of unspecified female breast: Secondary | ICD-10-CM

## 2024-09-22 HISTORY — PX: BREAST LUMPECTOMY WITH RADIO FREQUENCY LOCALIZER: SHX6897

## 2024-09-22 HISTORY — PX: AXILLARY SENTINEL NODE BIOPSY: SHX5738

## 2024-09-22 MED ORDER — FENTANYL CITRATE (PF) 100 MCG/2ML IJ SOLN
INTRAMUSCULAR | Status: AC
  Filled 2024-09-22: qty 2

## 2024-09-22 MED ORDER — ROCURONIUM BROMIDE 100 MG/10ML IV SOLN
INTRAVENOUS | Status: DC | PRN
  Administered 2024-09-22: 10 mg via INTRAVENOUS
  Administered 2024-09-22: 70 mg via INTRAVENOUS

## 2024-09-22 MED ORDER — ONDANSETRON HCL 4 MG/2ML IJ SOLN
INTRAMUSCULAR | Status: AC
  Filled 2024-09-22: qty 2

## 2024-09-22 MED ORDER — LACTATED RINGERS IV SOLN: INTRAVENOUS | Status: DC

## 2024-09-22 MED ORDER — CHLORHEXIDINE GLUCONATE CLOTH 2 % EX PADS: 6.0000 | MEDICATED_PAD | Freq: Once | CUTANEOUS | Status: DC

## 2024-09-22 MED ORDER — LACTATED RINGERS IV SOLN: INTRAVENOUS | Status: DC | PRN

## 2024-09-22 MED ORDER — OXYCODONE HCL 5 MG PO TABS
ORAL_TABLET | ORAL | Status: AC
  Filled 2024-09-22: qty 1

## 2024-09-22 MED ORDER — KETAMINE HCL 10 MG/ML IJ SOLN
INTRAMUSCULAR | Status: DC | PRN
  Administered 2024-09-22: 20 mg via INTRAVENOUS
  Administered 2024-09-22: 30 mg via INTRAVENOUS

## 2024-09-22 MED ORDER — FENTANYL CITRATE (PF) 100 MCG/2ML IJ SOLN
INTRAMUSCULAR | Status: DC | PRN
  Administered 2024-09-22: 100 ug via INTRAVENOUS
  Administered 2024-09-22 (×2): 50 ug via INTRAVENOUS

## 2024-09-22 MED ORDER — SUGAMMADEX SODIUM 200 MG/2ML IV SOLN
INTRAVENOUS | Status: DC | PRN
  Administered 2024-09-22: 200 mg via INTRAVENOUS

## 2024-09-22 MED ORDER — DEXMEDETOMIDINE HCL IN NACL 80 MCG/20ML IV SOLN
INTRAVENOUS | Status: DC | PRN
  Administered 2024-09-22: 8 ug via INTRAVENOUS
  Administered 2024-09-22 (×3): 4 ug via INTRAVENOUS

## 2024-09-22 MED ORDER — METHYLENE BLUE (ANTIDOTE) 1 % IV SOLN
INTRAVENOUS | Status: DC | PRN
  Administered 2024-09-22: 3 mL via INTRADERMAL

## 2024-09-22 MED ORDER — ROCURONIUM BROMIDE 10 MG/ML (PF) SYRINGE
PREFILLED_SYRINGE | INTRAVENOUS | Status: AC
  Filled 2024-09-22: qty 10

## 2024-09-22 MED ORDER — PROPOFOL 10 MG/ML IV BOLUS
INTRAVENOUS | Status: AC
  Filled 2024-09-22: qty 20

## 2024-09-22 MED ORDER — CHLORHEXIDINE GLUCONATE 0.12 % MT SOLN
OROMUCOSAL | Status: AC
  Filled 2024-09-22: qty 15

## 2024-09-22 MED ORDER — CEFAZOLIN SODIUM-DEXTROSE 2-4 GM/100ML-% IV SOLN
INTRAVENOUS | Status: AC
  Filled 2024-09-22: qty 100

## 2024-09-22 MED ORDER — CEFAZOLIN SODIUM-DEXTROSE 2-4 GM/100ML-% IV SOLN
2.0000 g | INTRAVENOUS | Status: AC
  Administered 2024-09-22: 2 g via INTRAVENOUS

## 2024-09-22 MED ORDER — ONDANSETRON HCL 4 MG/2ML IJ SOLN: 4.0000 mg | Freq: Once | INTRAMUSCULAR | Status: DC | PRN

## 2024-09-22 MED ORDER — BUPIVACAINE-EPINEPHRINE (PF) 0.5% -1:200000 IJ SOLN
INTRAMUSCULAR | Status: AC
  Filled 2024-09-22: qty 30

## 2024-09-22 MED ORDER — KETAMINE HCL 50 MG/5ML IJ SOSY
PREFILLED_SYRINGE | INTRAMUSCULAR | Status: AC
  Filled 2024-09-22: qty 5

## 2024-09-22 MED ORDER — ORAL CARE MOUTH RINSE: 15.0000 mL | Freq: Once | OROMUCOSAL | Status: AC

## 2024-09-22 MED ORDER — ISOSULFAN BLUE 1 % ~~LOC~~ SOLN
SUBCUTANEOUS | Status: AC
  Filled 2024-09-22: qty 5

## 2024-09-22 MED ORDER — STERILE WATER FOR IRRIGATION IR SOLN
Status: DC | PRN
  Administered 2024-09-22: 1000 mL

## 2024-09-22 MED ORDER — PHENYLEPHRINE 80 MCG/ML (10ML) SYRINGE FOR IV PUSH (FOR BLOOD PRESSURE SUPPORT)
PREFILLED_SYRINGE | INTRAVENOUS | Status: DC | PRN
  Administered 2024-09-22: 80 ug via INTRAVENOUS

## 2024-09-22 MED ORDER — MIDAZOLAM HCL (PF) 2 MG/2ML IJ SOLN
INTRAMUSCULAR | Status: DC | PRN
  Administered 2024-09-22: 2 mg via INTRAVENOUS

## 2024-09-22 MED ORDER — DEXAMETHASONE SOD PHOSPHATE PF 10 MG/ML IJ SOLN
INTRAMUSCULAR | Status: DC | PRN
  Administered 2024-09-22: 10 mg via INTRAVENOUS

## 2024-09-22 MED ORDER — OXYCODONE HCL 5 MG/5ML PO SOLN: 5.0000 mg | Freq: Once | ORAL | Status: AC | PRN

## 2024-09-22 MED ORDER — MIDAZOLAM HCL 2 MG/2ML IJ SOLN
INTRAMUSCULAR | Status: AC
  Filled 2024-09-22: qty 2

## 2024-09-22 MED ORDER — LIDOCAINE HCL (PF) 2 % IJ SOLN
INTRAMUSCULAR | Status: AC
  Filled 2024-09-22: qty 5

## 2024-09-22 MED ORDER — KETOROLAC TROMETHAMINE 30 MG/ML IJ SOLN
INTRAMUSCULAR | Status: DC | PRN
  Administered 2024-09-22: 30 mg via INTRAVENOUS

## 2024-09-22 MED ORDER — OXYCODONE HCL 5 MG PO TABS: 5.0000 mg | ORAL_TABLET | Freq: Four times a day (QID) | ORAL | 0 refills | Status: AC | PRN

## 2024-09-22 MED ORDER — CHLORHEXIDINE GLUCONATE 0.12 % MT SOLN
15.0000 mL | Freq: Once | OROMUCOSAL | Status: AC
  Administered 2024-09-22: 15 mL via OROMUCOSAL

## 2024-09-22 MED ORDER — ONDANSETRON HCL 4 MG/2ML IJ SOLN
INTRAMUSCULAR | Status: DC | PRN
  Administered 2024-09-22: 4 mg via INTRAVENOUS

## 2024-09-22 MED ORDER — CHLORHEXIDINE GLUCONATE CLOTH 2 % EX PADS
6.0000 | MEDICATED_PAD | Freq: Once | CUTANEOUS | Status: AC
  Administered 2024-09-22: 6 via TOPICAL

## 2024-09-22 MED ORDER — PROPOFOL 10 MG/ML IV BOLUS
INTRAVENOUS | Status: DC | PRN
  Administered 2024-09-22: 180 mg via INTRAVENOUS

## 2024-09-22 MED ORDER — OXYCODONE HCL 5 MG PO TABS
5.0000 mg | ORAL_TABLET | Freq: Once | ORAL | Status: AC | PRN
  Administered 2024-09-22: 5 mg via ORAL

## 2024-09-22 MED ORDER — BUPIVACAINE-EPINEPHRINE (PF) 0.5% -1:200000 IJ SOLN
INTRAMUSCULAR | Status: DC | PRN
  Administered 2024-09-22: 30 mL

## 2024-09-22 MED ORDER — LIDOCAINE HCL (CARDIAC) PF 100 MG/5ML IV SOSY
PREFILLED_SYRINGE | INTRAVENOUS | Status: DC | PRN
  Administered 2024-09-22: 100 mg via INTRAVENOUS

## 2024-09-22 MED ORDER — TECHNETIUM TC 99M TILMANOCEPT KIT
1.0500 | PACK | Freq: Once | INTRAVENOUS | Status: AC | PRN
  Administered 2024-09-22: 1.05 via INTRADERMAL

## 2024-09-22 MED ORDER — DEXAMETHASONE SOD PHOSPHATE PF 10 MG/ML IJ SOLN
INTRAMUSCULAR | Status: AC
  Filled 2024-09-22: qty 1

## 2024-09-22 MED ORDER — FENTANYL CITRATE (PF) 100 MCG/2ML IJ SOLN
25.0000 ug | INTRAMUSCULAR | Status: DC | PRN
  Administered 2024-09-22: 25 ug via INTRAVENOUS

## 2024-09-22 NOTE — H&P (Signed)
 Patient has undergone Sav[facility] scout placement.  Proceed with right lumpectomy with sentinel lymph node biopsy  History of Present Illness Judith Drake is a 54 y.o. female with past medical history significant for alcohol abuse and tobacco abuse who presents in consultation for right breast cancer.  The patient underwent a screening mammography that showed concerning lesions.  She under went then a diagnostic mammography that showed 3 lesions.  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  She reports that she did not have any overlying skin changes or nipple retraction.  She did not have any nipple discharge.  She has never had an abnormal mammogram before.  She does not have a strong history of breast cancer.  She had menarche at age 31 or 38.  She had her first live birth at 77 and is postmenopausal.  She does have a family history of prostate, colon lung and liver cancer.   Past Medical History     Past Medical History:  Diagnosis Date   Abnormal uterine bleeding (AUB) 03/20/2019   Acute lower UTI 05/12/2019   Acute respiratory failure with hypoxia (HCC) 04/10/2018   Anemia     Arthritis      lower back and hips   Dysmenorrhea 03/20/2019   Elevated liver enzymes 11/24/2013   ETOH abuse     Fatty liver 10/03/2019   Fibroid     GERD (gastroesophageal reflux disease)     Heavy menstrual period     Hypertension     Menorrhagia 12/13/2017   SIRS (systemic inflammatory response syndrome) (HCC) 05/12/2019                 Past Surgical History:  Procedure Laterality Date   BACK SURGERY       BREAST BIOPSY Right 08/07/2024    MM RT BREAST BX W LOC DEV EA AD LESION IMG BX SPEC STEREO GUIDE 08/07/2024 [facility]   BREAST BIOPSY Right 08/07/2024    US  RT BREAST BX W LOC DEV 1ST LESION IMG BX SPEC US  GUIDE 08/07/2024 [facility]   BREAST BIOPSY Right 08/07/2024    MM RT BREAST BX W LOC DEV 1ST LESION IMAGE BX SPEC STEREO GUIDE 08/07/2024 [facility]    COLONOSCOPY N/A 03/20/2024    Procedure: COLONOSCOPY;  Surgeon: Maryruth Ole DASEN, MD;  Location: [facility];  Service: Endoscopy;  Laterality: N/A;   CYSTOSCOPY N/A 12/19/2019    Procedure: CYSTOSCOPY;  Surgeon: Izell Harari, MD;  Location: [facility] OR;  Service: Gynecology;  Laterality: N/A;   ELBOW SURGERY        1997    ORIF ANKLE FRACTURE Right 03/25/2022    Procedure: OPEN REDUCTION INTERNAL FIXATION (ORIF) ANKLE FRACTURE;  Surgeon: Addie Cordella Hamilton, MD;  [facility];  Service: Orthopedics;  Laterality: Right;   POLYPECTOMY   03/20/2024    Procedure: POLYPECTOMY, INTESTINE;  Surgeon: Maryruth Ole DASEN, MD;  Location: [facility] END[facility]COPY;  Service: Endoscopy;;   TOTAL LAPAROSCOPIC HYSTERECTOMY WITH SALPINGECTOMY Bilateral 12/19/2019    Procedure: TOTAL LAPAROSCOPIC HYSTERECTOMY WITH BILATERAL SALPINGECTOMY;  Surgeon: Izell Harari, MD;  Location: [facility] OR;  Service: Gynecology;  Laterality: Bilateral;   TUBAL LIGATION              [Allergies]  [Allergies]      Allergen Reactions   Amoxicillin  Nausea And Vomiting      Has patient had a PCN reaction causing immediate rash, facial/tongue/throat swelling, SOB or lightheadedness with hypotension: No Has patient had a  PCN reaction causing severe rash involving mucus membranes or skin necrosis: No Has patient had a PCN reaction that required hospitalization: No Has patient had a PCN reaction occurring within the last 10 years: No If all of the above answers are NO, then may proceed with Cephalosporin use.     Dilaudid  [Hydromorphone  Hcl] Itching   Morphine  And Codeine Other (See Comments)      Hallucinations            Current Outpatient Medications  Medication Sig Dispense Refill   amLODipine  (NORVASC ) 10 MG tablet Take 1 tablet (10 mg total) by mouth daily. 90 tablet 3   Aspirin -Salicylamide-Caffeine (BC HEADACHE POWDER PO) Take 1 packet by mouth 2 (two) times daily as needed (for pain, headaches, or discomfort).        busPIRone  (BUSPAR ) 5 MG tablet Take 1 tablet (5 mg total) by mouth 3 (three) times daily as needed. 60 tablet 1   DULoxetine  (CYMBALTA ) 30 MG capsule TAKE TWO CAPSULES (60 MG TOTAL) BY MOUTH DAILY. 180 capsule 3   meloxicam  (MOBIC ) 7.5 MG tablet TAKE ONE (1) TO TWO (2) TABLETS (7.5-15 MG TOTAL) BY MOUTH DAILY AS NEEDED FOR PAIN. 60 tablet 2   omeprazole  (PRILOSEC) 20 MG capsule TAKE ONE CAPSULE (20 MG TOTAL) BY MOUTH DAILY. 90 capsule 3   ondansetron  (ZOFRAN ) 4 MG tablet Take 1 tablet (4 mg total) by mouth every 8 (eight) hours as needed for nausea or vomiting. 12 tablet 0      No current facility-administered medications for this visit.        Family History      Family History  Problem Relation Age of Onset   Lung cancer Mother 18   Hyperlipidemia Mother     Hypertension Mother     Stroke Mother     Kidney disease Mother     Diabetes Mother     Heart disease Mother     Depression Mother     Hyperlipidemia Father     Lung cancer Maternal Aunt     Cancer Maternal Uncle          unknown type   Cancer Maternal Uncle          unknown type   Colon cancer Paternal Aunt          dx >50   Prostate cancer Paternal Uncle          dx >50   Lung cancer Maternal Grandmother     Stomach cancer Maternal Grandfather     Prostate cancer Paternal Grandfather 38   Autism Son     Lung cancer Maternal Cousin     Cancer Maternal Cousin          unknown   Ovarian cancer Maternal Cousin     Breast cancer Neg Hx              Social History [Social History]   [Social History]      Tobacco Use   Smoking status: Some Days      Current packs/day: 0.50      Types: Cigarettes   Smokeless tobacco: Never  Vaping Use   Vaping status: Never Used  Substance Use Topics   Alcohol use: Yes      Comment: She drinks vodka and cranberry, approx 6 -8 mini bottles per day   Drug use: No   Drinks 6-8 drinks per day and also uses tobacco products.     ROS Full ROS of systems  performed and is  otherwise negative there than what is stated in the HPI   Physical Exam Blood pressure (!) 160/100, pulse (!) 105, temperature 98.9 F (37.2 C), temperature source Oral, height 5' 3 (1.6 m), weight 177 lb 9.6 oz (80.6 kg), SpO2 96%. Alert and oriented x 3, normal work of breathing room air, tearful at times during exam but affect appropriate, moving all extremities spontaneously, abdomen soft, nontender nondistended, breast exam performed in the presence of a chaperone.  Left breast there is no axillary lymphadenopathy.  There are no overlying skin changes to the left breast or nipple retraction.  On the right breast there is bruising throughout the breast with some induration where her biopsy sites were.  There is no dimpling of the skin or nipple discharge.  She has no right axillary lymphadenopathy.   Data Reviewed I reviewed her mammograms and biopsy findings.  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  Her MRI did not show any other concerning lesions but there is a lymph node that was concerning.   I have personally reviewed the patient's imaging and medical records.     Assessment Assessment Patient with right breast cancer.   Plan Plan I discussed extensively the treatment options for breast cancer.  The options include a lumpectomy with sentinel lymph node biopsy.  Other options include a mastectomy or double mastectomy plus or minus reconstruction.  I briefly went over the risk, benefits alternatives of each procedure and the patient has elected to undergo a lumpectomy with sentinel lymph node biopsy.  I discussed the risk, benefits alternatives in depth about this procedure including the risk of infection, bleeding which she is at increased risk for both given her alcohol use and tobacco use.  I also discussed the risk of positive margins and for any reneed for reexcision.  I discussed with her that if she undergoes a lumpectomy then  it would be recommended that she undergo adjuvant radiation therapy and given her hormonal status it would be recommended that she complete endocrine therapy for 5 years.  I discussed the risk of lymphadenopathy and the role of sentinel lymph node biopsy and Oncotype in determining whether she needs chemotherapy.  She understands these risk and wishes to with surgery.  We will plan for [facility] placement.   A total of 65 minutes was spent reviewing the patient's chart, performing history and physical and discussing treatment options with the patient   Jayson MALVA Endow

## 2024-09-22 NOTE — Transfer of Care (Signed)
 Immediate Anesthesia Transfer of Care Note  Patient: Judith Drake  Procedure(s) Performed: BREAST LUMPECTOMY WITH RADIO FREQUENCY LOCALIZER (Right: Breast) BIOPSY, LYMPH NODE, SENTINEL, AXILLARY (Right: Axilla)  Patient Location: PACU  Anesthesia Type:General  Level of Consciousness: awake, alert , oriented, drowsy, and patient cooperative  Airway & Oxygen Therapy: Patient Spontanous Breathing and Patient connected to face mask oxygen  Post-op Assessment: Report given to RN, Post -op Vital signs reviewed and stable, and Patient moving all extremities X 4  Post vital signs: Reviewed and stable  Last Vitals:  Vitals Value Taken Time  BP 116/72 09/22/24 12:06  Temp    Pulse 96 09/22/24 12:09  Resp 12 09/22/24 12:09  SpO2 97 % 09/22/24 12:09  Vitals shown include unfiled device data.  Last Pain:  Vitals:   09/22/24 0912  TempSrc: Temporal  PainSc: 0-No pain         Complications: No notable events documented.

## 2024-09-22 NOTE — Anesthesia Procedure Notes (Signed)
 Procedure Name: Intubation Date/Time: 09/22/2024 9:45 AM  Performed by: Ledora Duncan, CRNAPre-anesthesia Checklist: Patient identified, Emergency Drugs available, Suction available and Patient being monitored Patient Re-evaluated:Patient Re-evaluated prior to induction Oxygen Delivery Method: Circle system utilized Preoxygenation: Pre-oxygenation with 100% oxygen Induction Type: IV induction Ventilation: Mask ventilation without difficulty Laryngoscope Size: McGrath and 3 Grade View: Grade I Tube type: Oral Tube size: 7.0 mm Number of attempts: 1 Airway Equipment and Method: Stylet and Oral airway Placement Confirmation: ETT inserted through vocal cords under direct vision, positive ETCO2 and breath sounds checked- equal and bilateral Secured at: 21 cm Tube secured with: Tape Dental Injury: Teeth and Oropharynx as per pre-operative assessment  Comments: Atraumatic intubation

## 2024-09-22 NOTE — Op Note (Signed)
 " Procedure Date:  09/22/2024  Pre-operative Diagnosis:  Right Breast Cancer  Post-operative Diagnosis: Same  Procedure:  Right lumpectomy and sentinel lymph node biopsy  Surgeon:  Jayson Endow, MD  Anesthesia:  General endotracheal  Estimated Blood Loss:  20 ml  Specimens:  Right Sentinel lymph node biopsy #1, hot and blue, right axillary fibrofatty tissue, Right lumpectomy  Complications:  None  Indications for Procedure:  This is a 54 y.o. female who presents with right breast cancer.  The risks of bleeding, infection, injury to surrounding structures, hematoma, seroma, open wound, cosmetic deformity, and the need for further surgery were all discussed with the patient and was willing to proceed.  Prior to this procedure, the patient had undergone sentinel lymphoscintigraphy.  Description of Procedure: The patient was correctly identified in the preoperative area and brought into the operating room.  The patient was placed supine with VTE prophylaxis in place.  Appropriate time-outs were performed.  Anesthesia was induced and the patient was intubated.  Appropriate antibiotics were infused.  A visual dye was injected in the righ periareolar region under aseptic conditions, massage administered for 5 minutes. The right chest and axilla were prepped and draped in usual sterile fashion.   A neoprobe was then used to ensure that there was radioactivity in the axilla.  An incision was made at the anterior axillary hairline.  This was taken down through the subcutaneous tissue with Bovie cautery.  The clavipectoral fascia was then opened and the axilla was entered.  Using the neoprobe I did identify the highest count.  This was grasped with an Allis clamp.  The fibrofatty tissue surrounding this was then dissected off of a hot and blue node.  There were lymph channels to this noted that work clipped with a small clip.  The fibrofatty tissue surrounding it was fully dissected off and the node  was excised and intact.  The neoprobe was used and the lymph node had a count of 12,000.  There was some background radiation up to about 500.  This tissue was grasped with an Allis clamp and there were several lymph channels that were clipped off.  There was a packet of fibrofatty tissue that was excised.  This did have a positive neoprobe count.  This was passed off the table as fibrofatty tissue and possible lymph nodes.  Next I turned our attention to excising the tumor.  Using the [facility] scout locator the area of highest cadence was identified.  An incision was made over this.  This was taken down through the subcutaneous tissue with Bovie cautery into the breast parenchyma.  I started with the superior margin.  The superior margin was dissected to ensure that the [facility] scout localizer was within the specimen.  I then came across the medial and inferior margin again ensuring that the [facility] scout was within the specimen.  Once I came across our lateral margin a 3-0 Vicryl was used to stitch through the specimen.  This was used to elevate the specimen and I ensured that the [facility] scout localizer was within the lumpectomy.  I ensured that there was adequate healthy tissue surrounding the [facility] scout localizer to get good oncologic margins.  I then came across our posterior margin.  The specimen was then inked appropriately.  It was then placed in the Faxitron and we confirmed that we got our [facility] scout as well as the biopsy clip.  Incidentally we also got a biopsy clip corresponding to a benign biopsy.  The lumpectomy cavity was then inspected for hemostasis.  It was irrigated with warm saline solution.  The breast parenchyma was then loosely reapproximated with 3-0 Vicryl suture.  The deep dermal layer was then closed with 3-0 Vicryl.  The skin was then closed with 4-0 Monocryl.  I then turned my attention to closing the axilla.  The clavipectoral fascia was closed with 3-0 Vicryl after I ensured that there was  adequate hemostasis.  The deep dermal layer was closed with 3-0 Vicryl and the skin was closed with 4-0 Monocryl.  Both incisions were dressed with glue.  Prior to termination of the procedure all sponge and instrument counts were correct x 2.  The patient was then awoken from general endotracheal anesthesia and transferred the PACU in good condition.  Sentinel Node Biopsy Synoptic Operative Report  Operation performed with curative intent:Yes  Tracer(s) used to identify sentinel nodes in the upfront surgery (non-neoadjuvant) setting (select all that apply):Dye and Radioactive Tracer  Tracer(s) used to identify sentinel nodes in the neoadjuvant setting (select all that apply):N/A  All nodes (colored or non-colored) present at the end of a dye-filled lymphatic channel were removed:Yes   All significantly radioactive nodes were removed:Yes  All palpable suspicious nodes were removed:N/A  Biopsy-proven positive nodes marked with clips prior to chemotherapy were identified and removed:N/A    Jayson MALVA Endow M.D  "

## 2024-09-22 NOTE — Anesthesia Preprocedure Evaluation (Addendum)
 "                                  Anesthesia Evaluation  Patient identified by MRN, date of birth, ID band Patient awake    Reviewed: Allergy & Precautions, NPO status , Patient's Chart, lab work & pertinent test results  Airway Mallampati: III  TM Distance: >3 FB Neck ROM: full    Dental  (+) Teeth Intact   Pulmonary Current Smoker and Patient abstained from smoking.   Pulmonary exam normal  + decreased breath sounds      Cardiovascular Exercise Tolerance: Good hypertension, Pt. on medications Normal cardiovascular exam Rhythm:Regular Rate:Normal     Neuro/Psych   Anxiety     CVA  negative psych ROS   GI/Hepatic negative GI ROS,GERD  Medicated,,(+)     substance abuse  alcohol use  Endo/Other  negative endocrine ROSdiabetes, Type 2, Oral Hypoglycemic Agents  Class 3 obesity  Renal/GU negative Renal ROS  negative genitourinary   Musculoskeletal   Abdominal  (+) + obese  Peds negative pediatric ROS (+)  Hematology negative hematology ROS (+)   Anesthesia Other Findings Past Medical History: 03/20/2019: Abnormal uterine bleeding (AUB) 05/12/2019: Acute lower UTI 04/10/2018: Acute respiratory failure with hypoxia (HCC) No date: Anemia No date: Anxiety     Comment:  situational No date: Arthritis     Comment:  lower back and hips No date: Cancer (HCC)     Comment:  Breast cancer No date: Depression 03/20/2019: Dysmenorrhea 11/24/2013: Elevated liver enzymes No date: ETOH abuse 10/03/2019: Fatty liver No date: Fibroid No date: GERD (gastroesophageal reflux disease) No date: Heavy menstrual period No date: Hypertension 12/13/2017: Menorrhagia No date: Pneumonia No date: Pre-diabetes 05/12/2019: SIRS (systemic inflammatory response syndrome) (HCC) No date: Stroke [facility])     Comment:  TIA  Past Surgical History: No date: BACK SURGERY 08/07/2024: BREAST BIOPSY; Right     Comment:  MM RT BREAST BX W LOC DEV EA AD LESION IMG BX SPEC                STEREO GUIDE 08/07/2024 [facility] 08/07/2024: BREAST BIOPSY; Right     Comment:  US  RT BREAST BX W LOC DEV 1ST LESION IMG BX SPEC US                GUIDE 08/07/2024 [facility] 08/07/2024: BREAST BIOPSY; Right     Comment:  MM RT BREAST BX W LOC DEV 1ST LESION IMAGE BX SPEC               STEREO GUIDE 08/07/2024 [facility] No date: BREAST BIOPSY 09/05/2024: BREAST BIOPSY; Right     Comment:  MM RT BREAST BX W LOC DEV 1ST LESION IMAGE BX SPEC               STEREO GUIDE 09/05/2024 [facility] 09/20/2024: BREAST BIOPSY; Right     Comment:  MM RT BREAST SAVI/RF TAG 1ST LESION MAMMO GUIDE               09/20/2024 [facility] 03/20/2024: COLONOSCOPY; N/A     Comment:  Procedure: COLONOSCOPY;  Surgeon: Maryruth Ole DASEN,               MD;  Location: [facility];  Service: Endoscopy;                Laterality: N/A; 12/19/2019: CYSTOSCOPY; N/A     Comment:  Procedure: CYSTOSCOPY;  Surgeon: Izell Harari, MD;                Location: [facility];  Service: Gynecology;  Laterality: N/A; No date: ELBOW SURGERY     Comment:  1997  No date: FRACTURE SURGERY; Left     Comment:  left wrist repair No date: MOHS procedure chest wall 03/25/2022: ORIF ANKLE FRACTURE; Right     Comment:  Procedure: OPEN REDUCTION INTERNAL FIXATION (ORIF) ANKLE              FRACTURE;  Surgeon: Addie Cordella Hamilton, MD;  [facility];  Service: Orthopedics;  Laterality: Right; 03/20/2024: POLYPECTOMY     Comment:  Procedure: POLYPECTOMY, INTESTINE;  Surgeon: Maryruth Ole DASEN, MD;  Location: [facility];  Service:               Endoscopy;; 12/19/2019: TOTAL LAPAROSCOPIC HYSTERECTOMY WITH SALPINGECTOMY;  Bilateral     Comment:  Procedure: TOTAL LAPAROSCOPIC HYSTERECTOMY WITH               BILATERAL SALPINGECTOMY;  Surgeon: Izell Harari, MD;               Location: [facility] OR;  Service: Gynecology;  Laterality:               Bilateral; No date: TUBAL  LIGATION  BMI    Body Mass Index: 30.47 kg/m      Reproductive/Obstetrics negative OB ROS                              Anesthesia Physical Anesthesia Plan  ASA: 3  Anesthesia Plan: General   Post-op Pain Management:    Induction: Intravenous  PONV Risk Score and Plan: Ondansetron , Dexamethasone , Midazolam  and Treatment may vary due to age or medical condition  Airway Management Planned: Oral ETT  Additional Equipment:   Intra-op Plan:   Post-operative Plan: Extubation in OR  Informed Consent: I have reviewed the patients History and Physical, chart, labs and discussed the procedure including the risks, benefits and alternatives for the proposed anesthesia with the patient or authorized representative who has indicated his/her understanding and acceptance.     Dental Advisory Given  Plan Discussed with: CRNA  Anesthesia Plan Comments:          Anesthesia Quick Evaluation  "

## 2024-09-22 NOTE — Anesthesia Postprocedure Evaluation (Signed)
"   Anesthesia Post Note  Patient: Judith Drake  Procedure(s) Performed: BREAST LUMPECTOMY WITH RADIO FREQUENCY LOCALIZER (Right: Breast) BIOPSY, LYMPH NODE, SENTINEL, AXILLARY (Right: Axilla)  Patient location during evaluation: PACU Anesthesia Type: General Level of consciousness: awake Pain management: satisfactory to patient Vital Signs Assessment: post-procedure vital signs reviewed and stable Respiratory status: spontaneous breathing Cardiovascular status: stable Anesthetic complications: no   No notable events documented.   Last Vitals:  Vitals:   09/22/24 1315 09/22/24 1329  BP: 118/79 114/78  Pulse: 83 83  Resp: 15 16  Temp:  (!) 36.3 C  SpO2: 96% 96%    Last Pain:  Vitals:   09/22/24 1329  TempSrc: Temporal  PainSc: 0-No pain                 VAN STAVEREN,Raven Harmes      "

## 2024-09-23 ENCOUNTER — Encounter: Payer: Self-pay | Admitting: General Surgery

## 2024-09-26 ENCOUNTER — Encounter: Payer: Self-pay | Admitting: *Deleted

## 2024-09-26 ENCOUNTER — Encounter: Payer: Self-pay | Admitting: General Surgery

## 2024-09-26 LAB — SURGICAL PATHOLOGY

## 2024-09-26 NOTE — Progress Notes (Signed)
 Oncotype Dx order [id number] submitted online.

## 2024-10-04 ENCOUNTER — Ambulatory Visit: Payer: Self-pay | Admitting: Radiation Oncology

## 2024-10-04 ENCOUNTER — Inpatient Hospital Stay: Admitting: Occupational Therapy

## 2024-10-04 ENCOUNTER — Inpatient Hospital Stay: Admitting: Oncology

## 2024-10-05 ENCOUNTER — Encounter: Payer: Self-pay | Admitting: General Surgery

## 2024-10-05 ENCOUNTER — Ambulatory Visit: Admitting: General Surgery

## 2024-10-05 NOTE — Patient Instructions (Signed)
 Breast Cancer in Females: What to Know  Breast cancer is a malignant growth of tissue (tumor) in the breast. Unlike benign tumors, which aren't cancer, malignant tumors are cancer and can spread to other parts of the body. The two most common types of breast cancer start in the milk ducts or in the lobules where milk is made in the breast. Breast cancer is one of the most common types of cancer in females. What are the causes? The exact cause of breast cancer is not known. What increases the risk? These factors may make you more likely to develop breast cancer: Being 62 years old or older. Having a family history of breast cancer. Starting menopause after age 68. Starting your menstrual periods before age 70. Having never been pregnant or having your first child after age 26. Having never breastfed. A personal history of: Breast cancer. Dense breasts. Radiation exposure. Having the BRCA1 and BRCA2 genes. Having certain types of benign breast conditions. Exposure to the drug DES, which was given to pregnant people from the 1940s to the 1970s. Other risks include: Using birth control pills. Using hormone therapy after menopause. Drinking more than one alcoholic drink a day. Being very overweight (obese). What are the signs or symptoms? Symptoms of breast cancer include: A painless lump or thickening in your breast. Changes in the size or shape of your breast. Breast skin changes, such as puckering or dimpling that looks like an orange peel. Sores on the skin. Nipple abnormalities, such as scaling, crustiness, redness, or pulling in. Nipple discharge that is bloody or clear. How is this diagnosed? Breast cancer may be diagnosed by: Taking your medical history and doing a physical exam. During the exam, your health care provider will feel the tissue around your breast and under your arms. Doing imaging tests, such as breast X-rays (mammogram), ultrasound, or MRI. Taking a sample of  nipple discharge. The sample will be examined under a microscope. Taking a tissue sample from the breast. The sample will be examined to look for cancer cells. Having a biopsy of the lymph nodes near the affected breast. This is when a small piece of tissue is removed for testing. Your cancer will be staged after diagnosis to determine the size, location, and if it has spread in your body. This will help your cancer care team decide on a treatment that will work best for you. You may need to have more tests to determine the stage of your cancer. How is this treated? Depending on the type and stage, breast cancer may be treated with one or more of these therapies: Surgery. This may involve: Breast-conserving surgery which means that only the part of the breast containing the cancer is removed. Some normal tissue surrounding this area may also be removed. Surgery to remove the entire breast and nipple. Lymph nodes may also be removed. Radiation therapy. This uses high-energy rays to kill cancer cells. Chemotherapy, which is the use of medicines to kill cancer cells. Hormone therapy, which involves taking medicine to adjust the hormone levels in your body. You may take medicine to decrease your estrogen levels. This can help stop cancer cells from growing. Targeted therapy. These are medicines that are used to block the growth and spread of cancer cells. Targeted therapy may be used alone or in combination with chemotherapy. Immunotherapy, which is the use of medicines to boost the immune system to recognize and destroy cancer cells more effectively. A combination of surgery, radiation, chemotherapy, or hormone therapy  may be needed to treat breast cancer. Follow these instructions at home: Take your medicines only as told. Eat a healthy diet. A healthy diet includes lots of fruits and vegetables, low-fat dairy products, lean meats, and fiber. Make sure half your plate is filled with fruits or  vegetables. Choose high-fiber foods such as whole-grain breads and cereals. Consider joining a support group. This may help you cope with the stress of having breast cancer. Talk to your health care team about exercise and physical activity. The right exercise program can: Help prevent or reduce symptoms such as fatigue or depression. Improve overall health and survival rates. Where to find more information American Cancer Society: https://www.cancer.org/cancer/breast-cancer.html National Cancer Institute: chemicalattorney.at Contact a health care provider if: You have a sudden increase in pain. You have any symptoms or changes that concern you. You lose weight without trying. You notice a new lump in either breast or under your arm. You develop swelling in your arm or hand. You have a fever. You notice new tiredness or weakness. Get help right away if: You have chest pain. You have trouble breathing. These symptoms may be an emergency. Call 911 right away. Do not wait to see if the symptoms will go away. Do not drive yourself to the hospital. This information is not intended to replace advice given to you by your health care provider. Make sure you discuss any questions you have with your health care provider. Document Revised: 12/16/2023 Document Reviewed: 12/16/2023 Elsevier Patient Education  2025 [employer].

## 2024-10-10 ENCOUNTER — Encounter: Payer: Self-pay | Admitting: *Deleted

## 2024-10-10 ENCOUNTER — Encounter: Payer: Self-pay | Admitting: Oncology

## 2024-10-10 NOTE — Progress Notes (Signed)
 Judith Drake needs to r/s her appointments from tomorrow.   Appointments with Dr. Babara, Deland, and Dr. Lenn have been moved to 2/18.

## 2024-10-11 ENCOUNTER — Inpatient Hospital Stay: Payer: Self-pay | Admitting: Oncology

## 2024-10-11 ENCOUNTER — Inpatient Hospital Stay: Payer: Self-pay | Admitting: Occupational Therapy

## 2024-10-11 ENCOUNTER — Ambulatory Visit: Payer: Self-pay | Admitting: Radiation Oncology

## 2024-10-25 ENCOUNTER — Inpatient Hospital Stay: Payer: Self-pay | Admitting: Oncology

## 2024-10-25 ENCOUNTER — Ambulatory Visit: Admitting: Radiation Oncology

## 2024-10-25 ENCOUNTER — Inpatient Hospital Stay: Payer: Self-pay | Admitting: Occupational Therapy

## 2024-12-21 ENCOUNTER — Ambulatory Visit: Admitting: Family
